# Patient Record
Sex: Female | Born: 1955 | Race: Black or African American | Hispanic: No | Marital: Married | Smoking: Never smoker
Health system: Southern US, Community
[De-identification: ages and names within clinical notes are randomized; demographics above are authoritative.]

## PROBLEM LIST (undated history)

## (undated) DIAGNOSIS — I10 Essential (primary) hypertension: Secondary | ICD-10-CM

## (undated) DIAGNOSIS — I2699 Other pulmonary embolism without acute cor pulmonale: Secondary | ICD-10-CM

## (undated) DIAGNOSIS — E119 Type 2 diabetes mellitus without complications: Secondary | ICD-10-CM

## (undated) HISTORY — PX: ABDOMINAL HYSTERECTOMY: SHX81

---

## 2008-11-15 ENCOUNTER — Encounter: Admission: RE | Admit: 2008-11-15 | Discharge: 2008-11-15 | Payer: Self-pay | Admitting: Occupational Medicine

## 2008-11-15 IMAGING — CR DG CERVICAL SPINE COMPLETE 4+V
5 series · 5 of 5 positions shown · non-contrast
Comparison: None

CLINICAL DATA: Neck pain

CERVICAL SPINE - COMPLETE 4+ VIEW

[view not recorded (1 of 5)]
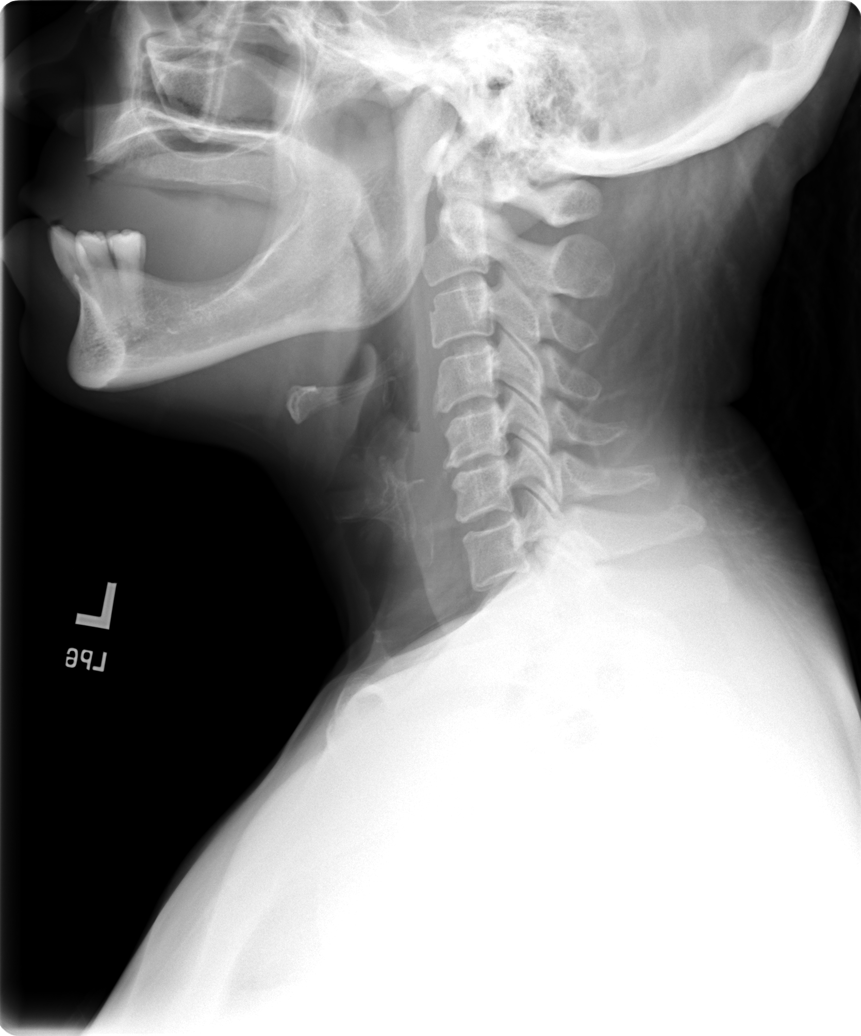

[view not recorded (2 of 5)]
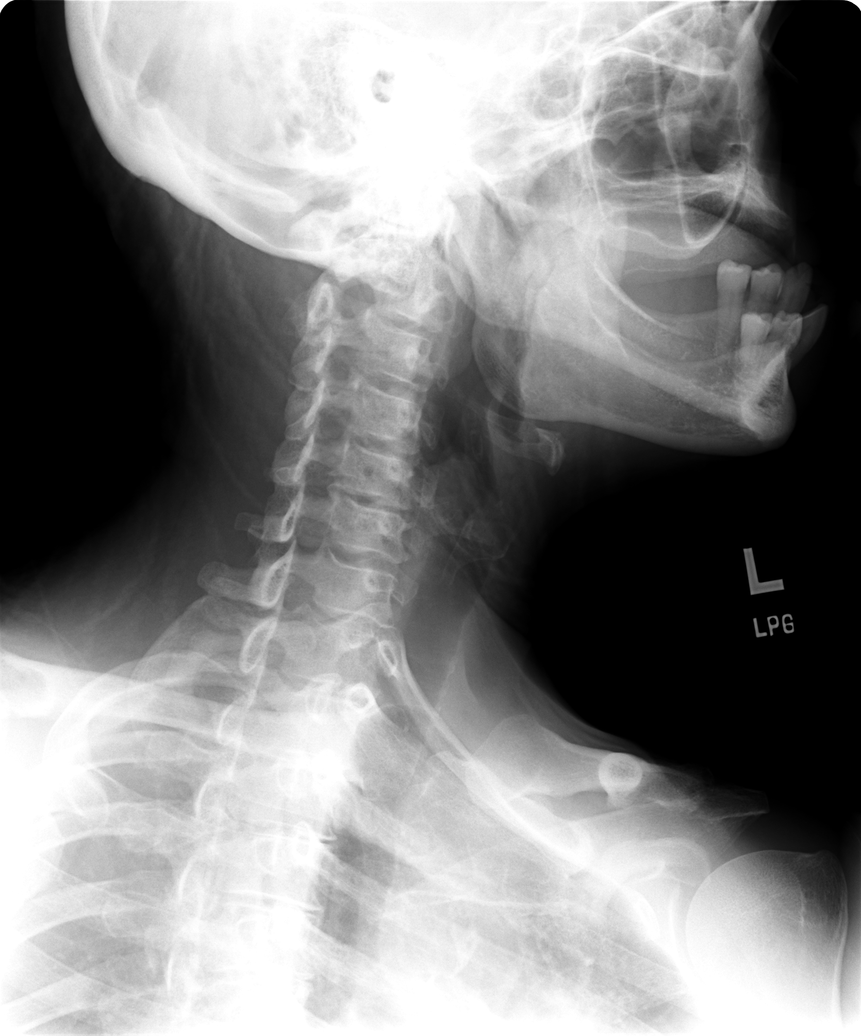

[view not recorded (3 of 5)]
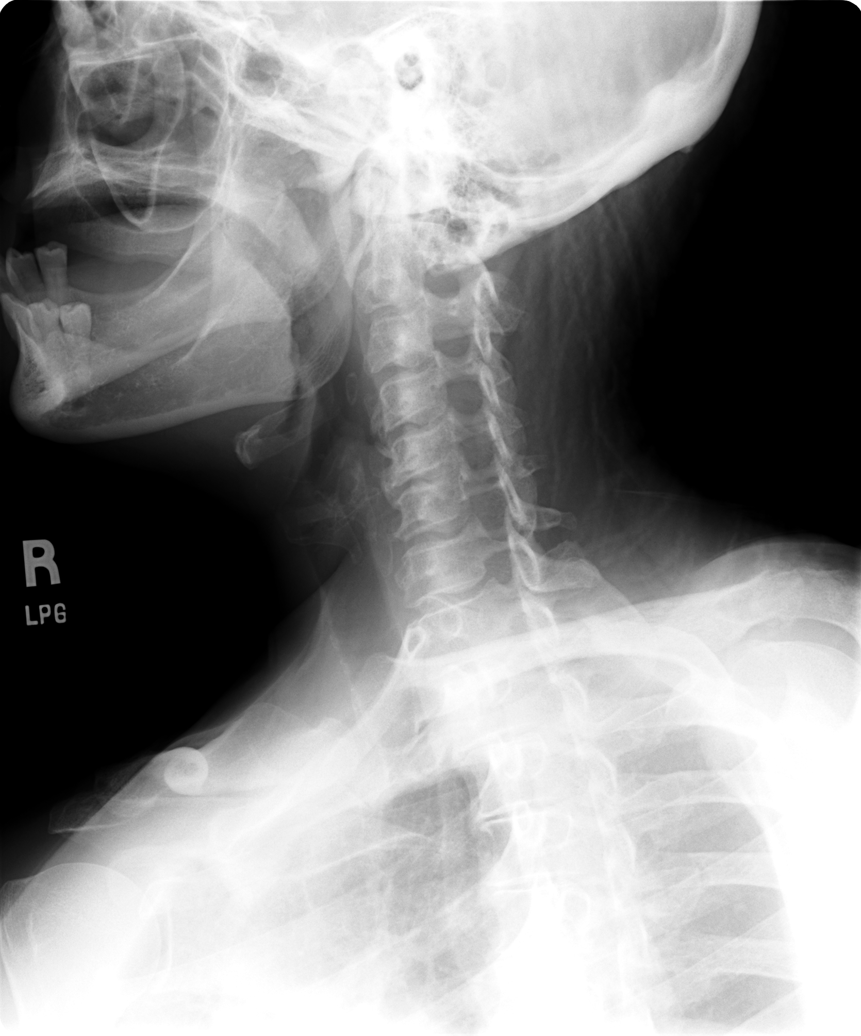

[view not recorded (4 of 5)]
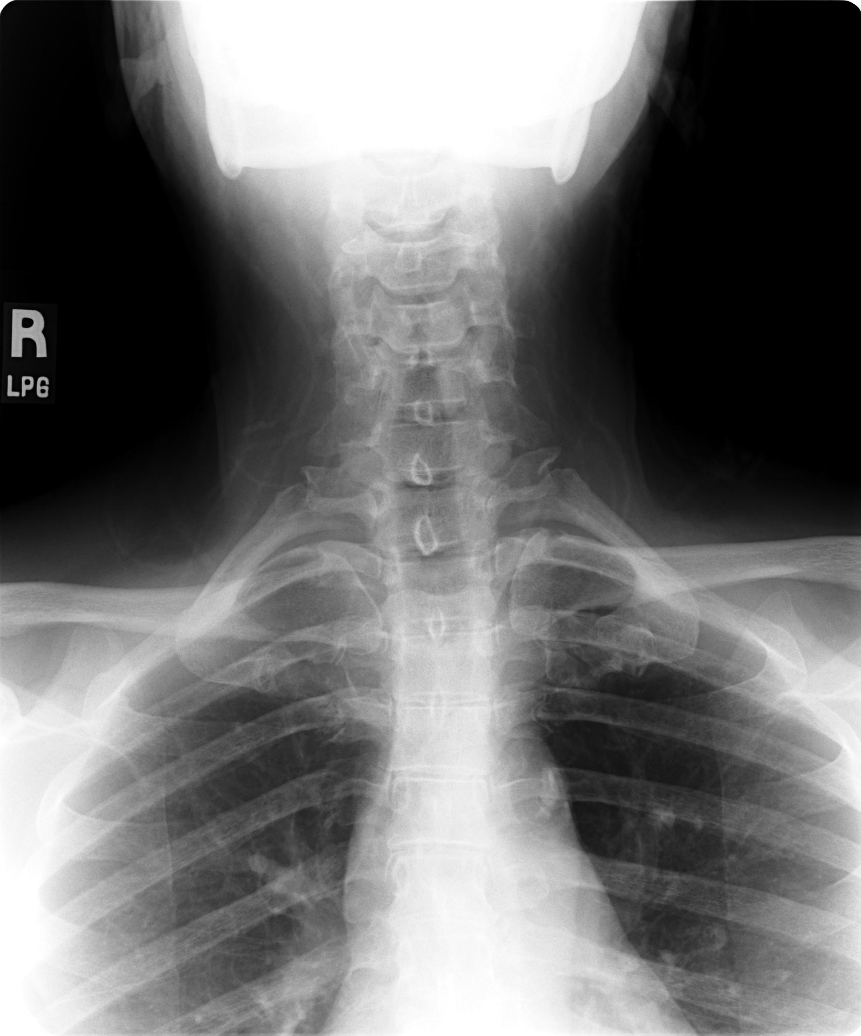

[view not recorded (5 of 5)]
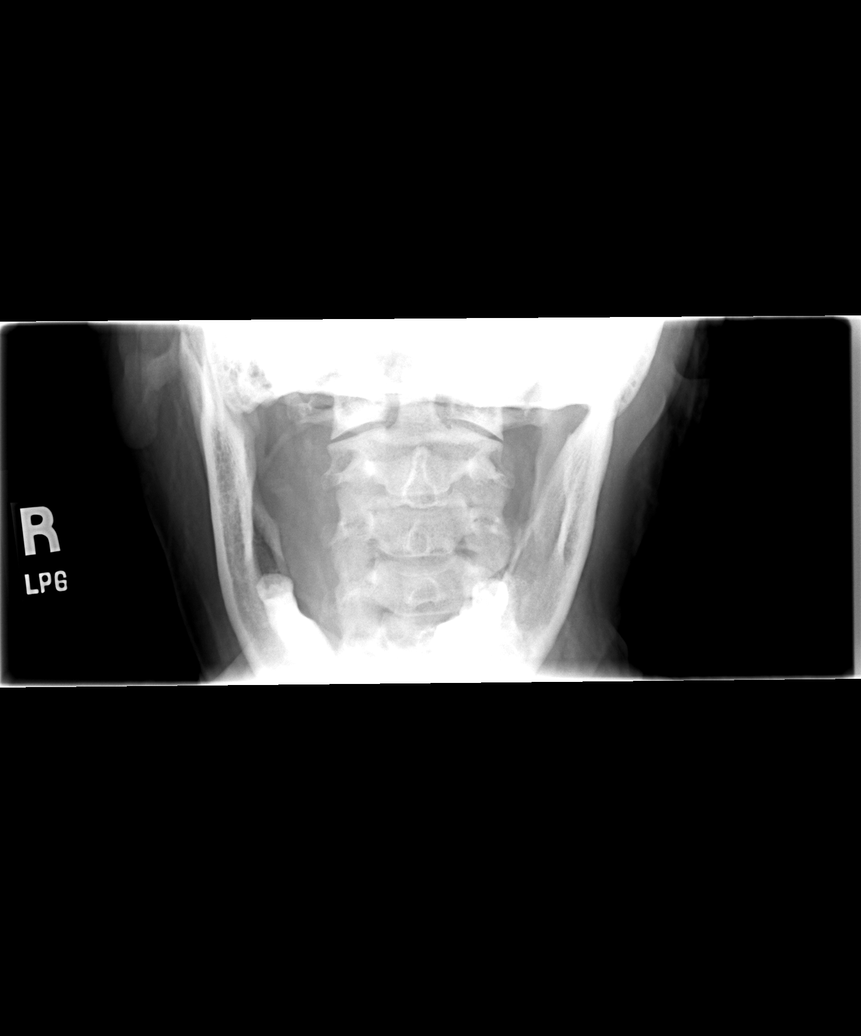

[5 of 5 positions shown; findings below may reference images not displayed]

FINDINGS: The alignment of the cervical spine appears normal.

The vertebral body heights are well preserved.

There is disc space narrowing with anterior spur formation at C4-5
and C5-6.

No fractures or dislocations identified.
IMPRESSION: 1.  There are no acute findings.
2.  Cervical degenerative disc disease.

## 2009-06-07 ENCOUNTER — Encounter: Admission: RE | Admit: 2009-06-07 | Discharge: 2009-06-07 | Payer: Self-pay | Admitting: Internal Medicine

## 2011-06-10 ENCOUNTER — Other Ambulatory Visit: Payer: Self-pay | Admitting: Internal Medicine

## 2011-06-10 DIAGNOSIS — Z1231 Encounter for screening mammogram for malignant neoplasm of breast: Secondary | ICD-10-CM

## 2011-06-27 ENCOUNTER — Ambulatory Visit
Admission: RE | Admit: 2011-06-27 | Discharge: 2011-06-27 | Disposition: A | Discharge status: Home / Self Care | Payer: BC Managed Care – PPO | Source: Ambulatory Visit | Attending: Internal Medicine | Admitting: Internal Medicine

## 2011-06-27 DIAGNOSIS — Z1231 Encounter for screening mammogram for malignant neoplasm of breast: Secondary | ICD-10-CM

## 2012-04-13 DIAGNOSIS — U071 COVID-19: Secondary | ICD-10-CM

## 2012-04-13 HISTORY — DX: COVID-19: U07.1

## 2013-02-15 ENCOUNTER — Other Ambulatory Visit: Payer: Self-pay

## 2013-02-15 DIAGNOSIS — Z1231 Encounter for screening mammogram for malignant neoplasm of breast: Secondary | ICD-10-CM

## 2013-03-08 ENCOUNTER — Ambulatory Visit: Payer: BC Managed Care – PPO

## 2013-03-14 ENCOUNTER — Ambulatory Visit
Admission: RE | Admit: 2013-03-14 | Discharge: 2013-03-14 | Disposition: A | Discharge status: Home / Self Care | Payer: BC Managed Care – PPO | Source: Ambulatory Visit

## 2013-03-14 DIAGNOSIS — Z1231 Encounter for screening mammogram for malignant neoplasm of breast: Secondary | ICD-10-CM

## 2013-03-21 ENCOUNTER — Ambulatory Visit: Payer: BC Managed Care – PPO

## 2013-06-10 ENCOUNTER — Other Ambulatory Visit: Payer: Self-pay | Admitting: Internal Medicine

## 2013-06-10 DIAGNOSIS — E2839 Other primary ovarian failure: Secondary | ICD-10-CM

## 2013-07-12 ENCOUNTER — Ambulatory Visit
Admission: RE | Admit: 2013-07-12 | Discharge: 2013-07-12 | Disposition: A | Discharge status: Home / Self Care | Payer: BC Managed Care – PPO | Source: Ambulatory Visit | Attending: Internal Medicine | Admitting: Internal Medicine

## 2013-07-12 ENCOUNTER — Encounter (INDEPENDENT_AMBULATORY_CARE_PROVIDER_SITE_OTHER): Payer: Self-pay

## 2013-07-12 DIAGNOSIS — E2839 Other primary ovarian failure: Secondary | ICD-10-CM

## 2014-11-06 ENCOUNTER — Encounter (HOSPITAL_BASED_OUTPATIENT_CLINIC_OR_DEPARTMENT_OTHER): Payer: Self-pay | Admitting: *Deleted

## 2014-11-06 DIAGNOSIS — N12 Tubulo-interstitial nephritis, not specified as acute or chronic: Secondary | ICD-10-CM | POA: Diagnosis not present

## 2014-11-06 DIAGNOSIS — E860 Dehydration: Secondary | ICD-10-CM | POA: Diagnosis not present

## 2014-11-06 DIAGNOSIS — Z794 Long term (current) use of insulin: Secondary | ICD-10-CM | POA: Insufficient documentation

## 2014-11-06 DIAGNOSIS — R509 Fever, unspecified: Secondary | ICD-10-CM | POA: Diagnosis present

## 2014-11-06 DIAGNOSIS — I10 Essential (primary) hypertension: Secondary | ICD-10-CM | POA: Diagnosis not present

## 2014-11-06 DIAGNOSIS — Z7982 Long term (current) use of aspirin: Secondary | ICD-10-CM | POA: Insufficient documentation

## 2014-11-06 DIAGNOSIS — E1165 Type 2 diabetes mellitus with hyperglycemia: Secondary | ICD-10-CM | POA: Diagnosis not present

## 2014-11-06 LAB — URINALYSIS, ROUTINE W REFLEX MICROSCOPIC
BILIRUBIN URINE: NEGATIVE
Glucose, UA: >1000 mg/dL — AB
KETONES UR: 15 mg/dL — AB
NITRITE: POSITIVE — AB
PROTEIN: 30 mg/dL — AB
SPECIFIC GRAVITY, URINE: 1.017 (ref 1.005–1.030)
UROBILINOGEN UA: 1 mg/dL (ref 0.0–1.0)
pH: 5.5 (ref 5.0–8.0)

## 2014-11-06 LAB — URINE MICROSCOPIC-ADD ON

## 2014-11-06 MED ORDER — ACETAMINOPHEN 500 MG PO TABS
1000.0000 mg | ORAL_TABLET | Freq: Once | ORAL | Status: AC
  Administered 2014-11-06: 1000 mg via ORAL
  Filled 2014-11-06: qty 2

## 2014-11-06 NOTE — ED Notes (Signed)
Fever x 2 days. Back pain.

## 2014-11-07 ENCOUNTER — Emergency Department (HOSPITAL_BASED_OUTPATIENT_CLINIC_OR_DEPARTMENT_OTHER)
Admission: EM | Admit: 2014-11-07 | Discharge: 2014-11-07 | Disposition: A | Discharge status: Home / Self Care | Payer: BC Managed Care – PPO | Attending: Emergency Medicine | Admitting: Emergency Medicine

## 2014-11-07 DIAGNOSIS — N12 Tubulo-interstitial nephritis, not specified as acute or chronic: Secondary | ICD-10-CM

## 2014-11-07 DIAGNOSIS — R739 Hyperglycemia, unspecified: Secondary | ICD-10-CM

## 2014-11-07 HISTORY — DX: Essential (primary) hypertension: I10

## 2014-11-07 HISTORY — DX: Type 2 diabetes mellitus without complications: E11.9

## 2014-11-07 LAB — BASIC METABOLIC PANEL
Anion gap: 8 (ref 5–15)
BUN: 17 mg/dL (ref 6–20)
CALCIUM: 9.3 mg/dL (ref 8.9–10.3)
CHLORIDE: 99 mmol/L — AB (ref 101–111)
CO2: 26 mmol/L (ref 22–32)
CREATININE: 0.97 mg/dL (ref 0.44–1.00)
GFR calc non Af Amer: >60 mL/min (ref >60)
GLUCOSE: 312 mg/dL — AB (ref 65–99)
Potassium: 3.9 mmol/L (ref 3.5–5.1)
Sodium: 133 mmol/L — ABNORMAL LOW (ref 135–145)

## 2014-11-07 LAB — CBC WITH DIFFERENTIAL/PLATELET
BASOS PCT: 0 %
Basophils Absolute: 0 10*3/uL (ref 0.0–0.1)
Eosinophils Absolute: 0 10*3/uL (ref 0.0–0.7)
Eosinophils Relative: 0 %
HEMATOCRIT: 33.9 % — AB (ref 36.0–46.0)
HEMOGLOBIN: 10.8 g/dL — AB (ref 12.0–15.0)
LYMPHS ABS: 1.3 10*3/uL (ref 0.7–4.0)
LYMPHS PCT: 10 %
MCH: 28.3 pg (ref 26.0–34.0)
MCHC: 31.9 g/dL (ref 30.0–36.0)
MCV: 88.7 fL (ref 78.0–100.0)
MONO ABS: 0.9 10*3/uL (ref 0.1–1.0)
MONOS PCT: 7 %
NEUTROS ABS: 10.8 10*3/uL — AB (ref 1.7–7.7)
NEUTROS PCT: 83 %
Platelets: 177 10*3/uL (ref 150–400)
RBC: 3.82 MIL/uL — ABNORMAL LOW (ref 3.87–5.11)
RDW: 11.3 % — AB (ref 11.5–15.5)
WBC: 13.1 10*3/uL — ABNORMAL HIGH (ref 4.0–10.5)

## 2014-11-07 LAB — I-STAT CG4 LACTIC ACID, ED: Lactic Acid, Venous: 0.63 mmol/L (ref 0.5–2.0)

## 2014-11-07 MED ORDER — CEFTRIAXONE SODIUM 2 G IJ SOLR
INTRAMUSCULAR | Status: AC
  Filled 2014-11-07: qty 2

## 2014-11-07 MED ORDER — CIPROFLOXACIN HCL 500 MG PO TABS: 500.0000 mg | ORAL_TABLET | Freq: Two times a day (BID) | ORAL | Status: DC

## 2014-11-07 MED ORDER — DEXTROSE 5 % IV SOLN
2.0000 g | Freq: Once | INTRAVENOUS | Status: AC
  Administered 2014-11-07: 2 g via INTRAVENOUS

## 2014-11-07 NOTE — Discharge Instructions (Signed)

## 2014-11-07 NOTE — ED Provider Notes (Signed)
CSN: [id number]     Arrival date & time 11/06/14  2241 History   First MD Initiated Contact with Patient 11/07/14 0129     Chief Complaint  Patient presents with  . Fever     (Consider location/radiation/quality/duration/timing/severity/associated sxs/prior Treatment) HPI  This a 59 year old female who presents with generalized bodyaches, chills and back pain. Patient reports onset of symptoms 2 days ago. She reports suprapubic pain as well. She states that she feels she is dehydrated. Her back pain is midline and nonradiating. Currently she reports no pain at all. She states that she feels much better after Tylenol given in triage. She was noted at that time to have a temperature of 102.4. She has not taken her temperature at home. Patient is a diabetic. Reports her blood sugars have been slightly elevated today. At home it was 296. Denies any chest pain, cough, shortness of breath, abdominal pain, nausea or vomiting.  Patient denies hematuria or dysuria.  Past Medical History  Diagnosis Date  . Diabetes mellitus without complication (HCC)   . Hypertension    Past Surgical History  Procedure Laterality Date  . Abdominal hysterectomy     No family history on file. Social History  Substance Use Topics  . Smoking status: Never Smoker   . Smokeless tobacco: None  . Alcohol Use: No   OB History    No data available     Review of Systems  Constitutional: Positive for fever and chills.  Respiratory: Negative for chest tightness and shortness of breath.   Cardiovascular: Negative for chest pain.  Gastrointestinal: Positive for abdominal pain. Negative for nausea and vomiting.  Genitourinary: Negative for dysuria.  Musculoskeletal: Positive for back pain.  Neurological: Negative for headaches.  All other systems reviewed and are negative.     Allergies  Review of patient's allergies indicates no known allergies.  Home Medications   Prior to Admission medications    Medication Sig Start Date End Date Taking? Authorizing Provider  aspirin 81 MG tablet Take 81 mg by mouth daily.   Yes Historical Provider, MD  insulin aspart protamine- aspart (NOVOLOG MIX 70/30) (70-30) 100 UNIT/ML injection Inject into the skin.   Yes Historical Provider, MD  LISINOPRIL PO Take by mouth.   Yes Historical Provider, MD  ciprofloxacin (CIPRO) 500 MG tablet Take 1 tablet (500 mg total) by mouth every 12 (twelve) hours. 11/07/14   Shon Baton, MD   BP 140/63 mmHg  Pulse 104  Temp(Src) 98.7 F (37.1 C) (Oral)  Resp 18  Ht 5' (1.524 m)  Wt 170 lb (77.111 kg)  BMI 33.20 kg/m2  SpO2 98% Physical Exam  Constitutional: She is oriented to person, place, and time. She appears well-developed and well-nourished. No distress.  HENT:  Head: Normocephalic and atraumatic.  Cardiovascular: Normal rate, regular rhythm and normal heart sounds.   No murmur heard. Pulmonary/Chest: Effort normal and breath sounds normal. No respiratory distress. She has no wheezes.  Abdominal: Soft. Bowel sounds are normal. There is no tenderness. There is no rebound and no guarding.  Genitourinary:  No CVA tenderness  Musculoskeletal: She exhibits no edema.  Neurological: She is alert and oriented to person, place, and time.  Skin: Skin is warm and dry.  Psychiatric: She has a normal mood and affect.  Nursing note and vitals reviewed.   ED Course  Procedures (including critical care time) Labs Review Labs Reviewed  URINALYSIS, ROUTINE W REFLEX MICROSCOPIC (NOT AT [facility]) - Abnormal; Notable for the following:  APPearance TURBID (*)    Glucose, UA >1000 (*)    Hgb urine dipstick MODERATE (*)    Ketones, ur 15 (*)    Protein, ur 30 (*)    Nitrite POSITIVE (*)    Leukocytes, UA LARGE (*)    All other components within normal limits  URINE MICROSCOPIC-ADD ON - Abnormal; Notable for the following:    Squamous Epithelial / LPF FEW (*)    Bacteria, UA MANY (*)    All other components  within normal limits  CBC WITH DIFFERENTIAL/PLATELET - Abnormal; Notable for the following:    WBC 13.1 (*)    RBC 3.82 (*)    Hemoglobin 10.8 (*)    HCT 33.9 (*)    RDW 11.3 (*)    Neutro Abs 10.8 (*)    All other components within normal limits  BASIC METABOLIC PANEL - Abnormal; Notable for the following:    Sodium 133 (*)    Chloride 99 (*)    Glucose, Bld 312 (*)    All other components within normal limits  URINE CULTURE  I-STAT CG4 LACTIC ACID, ED  I-STAT CG4 LACTIC ACID, ED    Imaging Review No results found. I have personally reviewed and evaluated these images and lab results as part of my medical decision-making.   EKG Interpretation   Date/Time:  Monday November 06 2014 23:11:34 EDT Ventricular Rate:  118 PR Interval:  148 QRS Duration: 64 QT Interval:  306 QTC Calculation: 428 R Axis:   50 Text Interpretation:  Sinus tachycardia Otherwise normal ECG Confirmed by  Ridhi Hoffert  MD, Toni AmendOURTNEY ([id number]) on 11/07/2014 1:48:54 AM      MDM   Final diagnoses:  Pyelonephritis  Hyperglycemia    This a very pleasant 59 year old female who presents with chills, back pain, suprapubic pain and fever. Nontoxic on exam. She is currently asymptomatic and her exam is benign. She was noted to be febrile to 102.4 with tachycardia on initial evaluation. Tachycardia has improved as fever has defervesced.  Patient was nitrite-positive urine into numerous to count white cells. She has no CVA tenderness at the time but given back pain/fever, suspect pyelonephritis. Patient with mild leukocytosis to 13.1. Lactate is normal. Given that the patient is feeling better, feel she is a candidate for outpatient treatment. No evidence of DKA. She does have evidence of hyperglycemia. Patient was given 2 g of Rocephin and will be discharged with Cipro. Urine culture pending. Patient was given strict return precautions.  After history, exam, and medical workup I feel the patient has been appropriately  medically screened and is safe for discharge home. Pertinent diagnoses were discussed with the patient. Patient was given return precautions.     Shon Batonourtney F Kadejah Sandiford, MD 11/07/14 [phone]

## 2014-11-09 LAB — URINE CULTURE: Culture: >=100000

## 2014-11-10 ENCOUNTER — Telehealth (HOSPITAL_BASED_OUTPATIENT_CLINIC_OR_DEPARTMENT_OTHER): Payer: Self-pay | Admitting: Emergency Medicine

## 2014-11-10 NOTE — Telephone Encounter (Signed)
Post ED Visit - Positive Culture Follow-up  Culture report reviewed by antimicrobial stewardship pharmacist:  []  Celedonio MiyamotoJeremy Frens, Pharm.D., BCPS []  Georgina PillionElizabeth Martin, Pharm.D., BCPS []  AmbroseMinh Pham, [address]Pharm[address]D., BCPS, AAHIVP []  Estella HuskMichelle Turner, Pharm.D., BCPS, AAHIVP []  Ashtonristy Reyes, [address]Pharm[address]D. []  Tennis Mustassie Stewart, [place]arm[place]D. Lisette GrinderAlyson Leonard PharmD  Positive urine culture E. coli Treated with ciprofloxacin, organism sensitive to the same and no further patient follow-up is required at this time.  Berle MullMiller, Beauregard Jarrells 11/10/2014, 11:12 AM

## 2014-11-19 ENCOUNTER — Emergency Department (HOSPITAL_BASED_OUTPATIENT_CLINIC_OR_DEPARTMENT_OTHER)
Admission: EM | Admit: 2014-11-19 | Discharge: 2014-11-19 | Disposition: A | Discharge status: Home / Self Care | Payer: BC Managed Care – PPO | Attending: Emergency Medicine | Admitting: Emergency Medicine

## 2014-11-19 ENCOUNTER — Encounter (HOSPITAL_BASED_OUTPATIENT_CLINIC_OR_DEPARTMENT_OTHER): Payer: Self-pay | Admitting: Emergency Medicine

## 2014-11-19 DIAGNOSIS — L293 Anogenital pruritus, unspecified: Secondary | ICD-10-CM | POA: Diagnosis present

## 2014-11-19 DIAGNOSIS — N390 Urinary tract infection, site not specified: Secondary | ICD-10-CM | POA: Insufficient documentation

## 2014-11-19 DIAGNOSIS — E119 Type 2 diabetes mellitus without complications: Secondary | ICD-10-CM | POA: Insufficient documentation

## 2014-11-19 DIAGNOSIS — Z7982 Long term (current) use of aspirin: Secondary | ICD-10-CM | POA: Insufficient documentation

## 2014-11-19 DIAGNOSIS — B373 Candidiasis of vulva and vagina: Secondary | ICD-10-CM | POA: Diagnosis not present

## 2014-11-19 DIAGNOSIS — I10 Essential (primary) hypertension: Secondary | ICD-10-CM | POA: Diagnosis not present

## 2014-11-19 DIAGNOSIS — B3731 Acute candidiasis of vulva and vagina: Secondary | ICD-10-CM

## 2014-11-19 LAB — WET PREP, GENITAL: TRICH WET PREP: NONE SEEN

## 2014-11-19 LAB — URINALYSIS, ROUTINE W REFLEX MICROSCOPIC
BILIRUBIN URINE: NEGATIVE
HGB URINE DIPSTICK: NEGATIVE
KETONES UR: NEGATIVE mg/dL
Nitrite: NEGATIVE
PH: 7 (ref 5.0–8.0)
PROTEIN: NEGATIVE mg/dL
Specific Gravity, Urine: 1.03 (ref 1.005–1.030)
Urobilinogen, UA: 0.2 mg/dL (ref 0.0–1.0)

## 2014-11-19 LAB — URINE MICROSCOPIC-ADD ON

## 2014-11-19 MED ORDER — FLUCONAZOLE 150 MG PO TABS: 150.0000 mg | ORAL_TABLET | Freq: Once | ORAL | Status: DC

## 2014-11-19 MED ORDER — NITROFURANTOIN MONOHYD MACRO 100 MG PO CAPS: 100.0000 mg | ORAL_CAPSULE | Freq: Two times a day (BID) | ORAL | Status: DC

## 2014-11-19 NOTE — ED Provider Notes (Signed)
CSN: [id number]     Arrival date & time 11/19/14  1617 History  By signing my name below, I, Terrance Branch, attest that this documentation has been prepared under the direction and in the presence of Arby Barrette, MD. Electronically Signed: Evon Slack, ED Scribe. 11/19/2014. 7:30 PM.     Chief Complaint  Patient presents with  . Vaginal Itching    Patient is a 59 y.o. female presenting with vaginal itching. The history is provided by the patient. No language interpreter was used.  Vaginal Itching   HPI Comments: Diane Willis is a 59 y.o. female who presents to the Emergency Department complaining of Vaginal itching onset 1 week prior. Pt states she does some slight vaginal discharge. Pt states that she was recently diagnosed with an UTI about 1 week prior. She states she was prescribed cipro which she was complaint with taking. Pt reports Hx of yeast infection several years prior and states that her symptoms are similar to previous yeast infection. Pt denies vaginal pain, dysuria, or hematuria     Past Medical History  Diagnosis Date  . Diabetes mellitus without complication (HCC)   . Hypertension    Past Surgical History  Procedure Laterality Date  . Abdominal hysterectomy     History reviewed. No pertinent family history. Social History  Substance Use Topics  . Smoking status: Never Smoker   . Smokeless tobacco: None  . Alcohol Use: No   OB History    No data available     Review of Systems 10 Systems reviewed and all are negative for acute change except as noted in the HPI.    Allergies  Review of patient's allergies indicates no known allergies.  Home Medications   Prior to Admission medications   Medication Sig Start Date End Date Taking? Authorizing Provider  aspirin 81 MG tablet Take 81 mg by mouth daily.    Historical Provider, MD  ciprofloxacin (CIPRO) 500 MG tablet Take 1 tablet (500 mg total) by mouth every 12 (twelve) hours. 11/07/14    Shon Baton, MD  fluconazole (DIFLUCAN) 150 MG tablet Take 1 tablet (150 mg total) by mouth once. 11/19/14   Arby Barrette, MD  insulin aspart protamine- aspart (NOVOLOG MIX 70/30) (70-30) 100 UNIT/ML injection Inject into the skin.    Historical Provider, MD  LISINOPRIL PO Take by mouth.    Historical Provider, MD  nitrofurantoin, macrocrystal-monohydrate, (MACROBID) 100 MG capsule Take 1 capsule (100 mg total) by mouth 2 (two) times daily. 11/19/14   Arby Barrette, MD   BP 167/81 mmHg  Pulse 99  Temp(Src) 97.5 F (36.4 C) (Oral)  Resp 20  Ht 5' (1.524 m)  Wt 165 lb (74.844 kg)  BMI 32.22 kg/m2  SpO2 100%   Physical Exam  Constitutional: She is oriented to person, place, and time. She appears well-developed and well-nourished.  HENT:  Head: Normocephalic and atraumatic.  Eyes: EOM are normal. Pupils are equal, round, and reactive to light.  Neck: Neck supple.  Cardiovascular: Normal rate, regular rhythm, normal heart sounds and intact distal pulses.   Pulmonary/Chest: Effort normal and breath sounds normal.  Abdominal: Soft. Bowel sounds are normal. She exhibits no distension. There is no tenderness.  Genitourinary:  Mild erythema and swelling of the external vaginal tissues. Small amount of white caseous material present. Speculum examination, blind pouch vaginal canal with small amount of whitish curd-like discharge.  Musculoskeletal: Normal range of motion. She exhibits no edema.  Neurological: She is alert and  oriented to person, place, and time. She has normal strength. Coordination normal. GCS eye subscore is 4. GCS verbal subscore is 5. GCS motor subscore is 6.  Skin: Skin is warm, dry and intact.  Psychiatric: She has a normal mood and affect.    ED Course  Procedures (including critical care time) DIAGNOSTIC STUDIES: Oxygen Saturation is 100% on RA, normal by my interpretation.    COORDINATION OF CARE: 5:12 PM-Discussed treatment plan with pt at bedside and pt  agreed to plan.     Labs Review Labs Reviewed  WET PREP, GENITAL - Abnormal; Notable for the following:    Yeast Wet Prep HPF POC FEW (*)    Clue Cells Wet Prep HPF POC MODERATE (*)    WBC, Wet Prep HPF POC MANY (*)    All other components within normal limits  URINALYSIS, ROUTINE W REFLEX MICROSCOPIC (NOT AT [facility]) - Abnormal; Notable for the following:    Glucose, UA >1000 (*)    Leukocytes, UA SMALL (*)    All other components within normal limits  URINE MICROSCOPIC-ADD ON - Abnormal; Notable for the following:    Squamous Epithelial / LPF FEW (*)    Bacteria, UA MANY (*)    All other components within normal limits  URINE CULTURE  GC/CHLAMYDIA PROBE AMP ([place]) NOT AT [facility]    Imaging Review No results found.    EKG Interpretation None      MDM   Final diagnoses:  Candida vaginitis  UTI (lower urinary tract infection)   Patient previously had Escherichia coli positive culture for UTI. She has finished her Cipro and does not have ongoing dysuria or or fever or pain. She does however have vaginal discharge and itching. Exam is consistent with yeast vaginitis for which patient be treated. Repeat UA does still have white blood cells present. With the patient having had greater than 100,000 colony positive culture, I will opt to treat with Macrobid to which this was sensitive on first culture as the patient still has a positive urine. She is counseled to follow-up with her family doctor this week.     Arby BarretteMarcy Sunya Humbarger, MD 11/19/14 [phone]

## 2014-11-19 NOTE — ED Notes (Signed)
Patient states that she was here about 1 week ago with a UTI - since then she has had some Vaginal itching possible r/t to the antibiotics

## 2014-11-19 NOTE — Discharge Instructions (Signed)
Monilial Vaginitis Vaginitis in a soreness, swelling and redness (inflammation) of the vagina and vulva. Monilial vaginitis is not a sexually transmitted infection. CAUSES  Yeast vaginitis is caused by yeast (candida) that is normally found in your vagina. With a yeast infection, the candida has overgrown in number to a point that upsets the chemical balance. SYMPTOMS   White, thick vaginal discharge.  Swelling, itching, redness and irritation of the vagina and possibly the lips of the vagina (vulva).  Burning or painful urination.  Painful intercourse. DIAGNOSIS  Things that may contribute to monilial vaginitis are:  Postmenopausal and virginal states.  Pregnancy.  Infections.  Being tired, sick or stressed, especially if you had monilial vaginitis in the past.  Diabetes. Good control will help lower the chance.  Birth control pills.  Tight fitting garments.  Using bubble bath, feminine sprays, douches or deodorant tampons.  Taking certain medications that kill germs (antibiotics).  Sporadic recurrence can occur if you become ill. TREATMENT  Your caregiver will give you medication.  There are several kinds of anti monilial vaginal creams and suppositories specific for monilial vaginitis. For recurrent yeast infections, use a suppository or cream in the vagina 2 times a week, or as directed.  Anti-monilial or steroid cream for the itching or irritation of the vulva may also be used. Get your caregiver's permission.  Painting the vagina with methylene blue solution may help if the monilial cream does not work.  Eating yogurt may help prevent monilial vaginitis. HOME CARE INSTRUCTIONS   Finish all medication as prescribed.  Do not have sex until treatment is completed or after your caregiver tells you it is okay.  Take warm sitz baths.  Do not douche.  Do not use tampons, especially scented ones.  Wear cotton underwear.  Avoid tight pants and panty  hose.  Tell your sexual partner that you have a yeast infection. They should go to their caregiver if they have symptoms such as mild rash or itching.  Your sexual partner should be treated as well if your infection is difficult to eliminate.  Practice safer sex. Use condoms.  Some vaginal medications cause latex condoms to fail. Vaginal medications that harm condoms are:  Cleocin cream.  Butoconazole (Femstat).  Terconazole (Terazol) vaginal suppository.  Miconazole (Monistat) (may be purchased over the counter). SEEK MEDICAL CARE IF:   You have a temperature by mouth above 102 F (38.9 C).  The infection is getting worse after 2 days of treatment.  The infection is not getting better after 3 days of treatment.  You develop blisters in or around your vagina.  You develop vaginal bleeding, and it is not your menstrual period.  You have pain when you urinate.  You develop intestinal problems.  You have pain with sexual intercourse.   This information is not intended to replace advice given to you by your health care provider. Make sure you discuss any questions you have with your health care provider.   Document Released: 10/09/2004 Document Revised: 03/24/2011 Document Reviewed: 07/03/2014 Elsevier Interactive Patient Education 2016 Elsevier Inc. Urinary Tract Infection Urinary tract infections (UTIs) can develop anywhere along your urinary tract. Your urinary tract is your body's drainage system for removing wastes and extra water. Your urinary tract includes two kidneys, two ureters, a bladder, and a urethra. Your kidneys are a pair of bean-shaped organs. Each kidney is about the size of your fist. They are located below your ribs, one on each side of your spine. CAUSES Infections are   caused by microbes, which are microscopic organisms, including fungi, viruses, and bacteria. These organisms are so small that they can only be seen through a microscope. Bacteria are  the microbes that most commonly cause UTIs. SYMPTOMS  Symptoms of UTIs may vary by age and gender of the patient and by the location of the infection. Symptoms in young women typically include a frequent and intense urge to urinate and a painful, burning feeling in the bladder or urethra during urination. Older women and men are more likely to be tired, shaky, and weak and have muscle aches and abdominal pain. A fever may mean the infection is in your kidneys. Other symptoms of a kidney infection include pain in your back or sides below the ribs, nausea, and vomiting. DIAGNOSIS To diagnose a UTI, your caregiver will ask you about your symptoms. Your caregiver will also ask you to provide a urine sample. The urine sample will be tested for bacteria and white blood cells. White blood cells are made by your body to help fight infection. TREATMENT  Typically, UTIs can be treated with medication. Because most UTIs are caused by a bacterial infection, they usually can be treated with the use of antibiotics. The choice of antibiotic and length of treatment depend on your symptoms and the type of bacteria causing your infection. HOME CARE INSTRUCTIONS  If you were prescribed antibiotics, take them exactly as your caregiver instructs you. Finish the medication even if you feel better after you have only taken some of the medication.  Drink enough water and fluids to keep your urine clear or pale yellow.  Avoid caffeine, tea, and carbonated beverages. They tend to irritate your bladder.  Empty your bladder often. Avoid holding urine for long periods of time.  Empty your bladder before and after sexual intercourse.  After a bowel movement, women should cleanse from front to back. Use each tissue only once. SEEK MEDICAL CARE IF:   You have back pain.  You develop a fever.  Your symptoms do not begin to resolve within 3 days. SEEK IMMEDIATE MEDICAL CARE IF:   You have severe back pain or lower  abdominal pain.  You develop chills.  You have nausea or vomiting.  You have continued burning or discomfort with urination. MAKE SURE YOU:   Understand these instructions.  Will watch your condition.  Will get help right away if you are not doing well or get worse.   This information is not intended to replace advice given to you by your health care provider. Make sure you discuss any questions you have with your health care provider.   Document Released: 10/09/2004 Document Revised: 09/20/2014 Document Reviewed: 02/07/2011 Elsevier Interactive Patient Education 2016 Elsevier Inc.  

## 2014-11-21 LAB — URINE CULTURE

## 2014-11-22 ENCOUNTER — Telehealth (HOSPITAL_BASED_OUTPATIENT_CLINIC_OR_DEPARTMENT_OTHER): Payer: Self-pay | Admitting: Emergency Medicine

## 2014-11-22 NOTE — Telephone Encounter (Signed)
Post ED Visit - Positive Culture Follow-up  Culture report reviewed by antimicrobial stewardship pharmacist:  []  Diane Willis, Pharm.D. []  Diane Willis, Pharm.D., BCPS []  Diane Willis, Pharm.D. []  Diane Willis, Pharm.D., BCPS []  Diane Willis, [address]Pharm[address]D., BCPS, AAHIVP []  Diane Willis, Pharm.D., BCPS, AAHIVP []  Diane Willis, Pharm.D. [x]  Diane Willis, [address]Pharm[address]  Positive urine culture Lactobacillus Treated with fluconazole, nitrofurantoin, likely contaminant, organism sensitive to the same and no further patient follow-up is required at this time.  Diane Willis, Diane Willis 11/22/2014, 2:10 PM

## 2014-11-22 NOTE — Progress Notes (Signed)
ED Antimicrobial Stewardship Positive Culture Follow Up   Diane Willis is an 59 y.o. female who [facility] with a chief complaint of  Chief Complaint  Patient presents with  . Vaginal Itching    Recent Results (from the past 720 hour(s))  Urine culture     Status: None   Collection Time: 11/06/14 11:05 PM  Result Value Ref Range Status   Specimen Description URINE, CLEAN CATCH  Final   Special Requests NONE  Final   Culture   Final    >=100,000 COLONIES/mL ESCHERICHIA COLI Performed at [facility] [place]    Report Status 11/09/2014 FINAL  Final   Organism ID, Bacteria ESCHERICHIA COLI  Final      Susceptibility   Escherichia coli - MIC*    AMPICILLIN >=32 RESISTANT Resistant     CEFAZOLIN <=4 SENSITIVE Sensitive     CEFTRIAXONE <=1 SENSITIVE Sensitive     CIPROFLOXACIN <=0.25 SENSITIVE Sensitive     GENTAMICIN <=1 SENSITIVE Sensitive     IMIPENEM <=0.25 SENSITIVE Sensitive     NITROFURANTOIN <=16 SENSITIVE Sensitive     TRIMETH/SULFA <=20 SENSITIVE Sensitive     AMPICILLIN/SULBACTAM 16 INTERMEDIATE Intermediate     PIP/TAZO <=4 SENSITIVE Sensitive     * >=100,000 COLONIES/mL ESCHERICHIA COLI  Wet prep, genital     Status: Abnormal   Collection Time: 11/19/14  6:27 PM  Result Value Ref Range Status   Yeast Wet Prep HPF POC FEW (A) NONE SEEN Final   Trich, Wet Prep NONE SEEN NONE SEEN Final   Clue Cells Wet Prep HPF POC MODERATE (A) NONE SEEN Final   WBC, Wet Prep HPF POC MANY (A) NONE SEEN Final  Urine culture     Status: None   Collection Time: 11/19/14  6:36 PM  Result Value Ref Range Status   Specimen Description URINE, CLEAN CATCH  Final   Special Requests NONE  Final   Culture   Final    >=100,000 COLONIES/mL LACTOBACILLUS SPECIES Standardized susceptibility testing for this organism is not available. Performed [facility]     Report Status 11/21/2014 FINAL  Final    Presents with vaginal itching and mild discharge x1  wk. Patient states her symptoms are similar to yeast infection she had previously. Of note, pt was treated with ciprofloxacin for E. Coli (cipro-S) UTI two weeks prior. Vaginal exam reveals small amount of whitish, curd-like discharge. No complaints of dysuria or abdominal pain per patient. Urine culture grew lactobacillus which is likely a contaminant. Discharged on fluconazole and nitrofurantoin. Discussed with Cheri FowlerKayla Rose, PA-C and agrees that patient does not need further ABX treatment.  ED Provider: Cheri FowlerKayla Rose, PA-C   Reuel Derbyobert J Vincent, PharmD. 11/22/2014, 9:20 AM PGY1 Resident Phone# [phone]

## 2014-12-19 ENCOUNTER — Other Ambulatory Visit: Payer: Self-pay

## 2014-12-19 DIAGNOSIS — Z1231 Encounter for screening mammogram for malignant neoplasm of breast: Secondary | ICD-10-CM

## 2015-01-05 ENCOUNTER — Ambulatory Visit
Admission: RE | Admit: 2015-01-05 | Discharge: 2015-01-05 | Disposition: A | Discharge status: Home / Self Care | Payer: BC Managed Care – PPO | Source: Ambulatory Visit

## 2015-01-05 DIAGNOSIS — Z1231 Encounter for screening mammogram for malignant neoplasm of breast: Secondary | ICD-10-CM

## 2015-11-15 ENCOUNTER — Other Ambulatory Visit: Payer: Self-pay | Admitting: Internal Medicine

## 2015-11-15 DIAGNOSIS — E2839 Other primary ovarian failure: Secondary | ICD-10-CM

## 2019-04-27 ENCOUNTER — Encounter (HOSPITAL_BASED_OUTPATIENT_CLINIC_OR_DEPARTMENT_OTHER): Payer: Self-pay | Admitting: *Deleted

## 2019-04-27 ENCOUNTER — Emergency Department (HOSPITAL_BASED_OUTPATIENT_CLINIC_OR_DEPARTMENT_OTHER): Payer: BC Managed Care – PPO

## 2019-04-27 ENCOUNTER — Inpatient Hospital Stay (HOSPITAL_BASED_OUTPATIENT_CLINIC_OR_DEPARTMENT_OTHER)
Admission: EM | Admit: 2019-04-27 | Discharge: 2019-04-30 | DRG: 177 | Disposition: A | Discharge status: Home / Self Care | Payer: BC Managed Care – PPO | Attending: Internal Medicine | Admitting: Internal Medicine

## 2019-04-27 ENCOUNTER — Other Ambulatory Visit: Payer: Self-pay

## 2019-04-27 DIAGNOSIS — J1282 Pneumonia due to coronavirus disease 2019: Secondary | ICD-10-CM | POA: Diagnosis present

## 2019-04-27 DIAGNOSIS — I2699 Other pulmonary embolism without acute cor pulmonale: Secondary | ICD-10-CM | POA: Diagnosis not present

## 2019-04-27 DIAGNOSIS — Z90710 Acquired absence of both cervix and uterus: Secondary | ICD-10-CM | POA: Diagnosis not present

## 2019-04-27 DIAGNOSIS — E11649 Type 2 diabetes mellitus with hypoglycemia without coma: Secondary | ICD-10-CM | POA: Diagnosis not present

## 2019-04-27 DIAGNOSIS — U071 COVID-19: Principal | ICD-10-CM | POA: Diagnosis present

## 2019-04-27 DIAGNOSIS — E669 Obesity, unspecified: Secondary | ICD-10-CM | POA: Diagnosis present

## 2019-04-27 DIAGNOSIS — Z6833 Body mass index (BMI) 33.0-33.9, adult: Secondary | ICD-10-CM | POA: Diagnosis not present

## 2019-04-27 DIAGNOSIS — R609 Edema, unspecified: Secondary | ICD-10-CM | POA: Diagnosis not present

## 2019-04-27 DIAGNOSIS — I2694 Multiple subsegmental pulmonary emboli without acute cor pulmonale: Secondary | ICD-10-CM | POA: Diagnosis present

## 2019-04-27 DIAGNOSIS — I10 Essential (primary) hypertension: Secondary | ICD-10-CM | POA: Diagnosis present

## 2019-04-27 DIAGNOSIS — E785 Hyperlipidemia, unspecified: Secondary | ICD-10-CM | POA: Diagnosis present

## 2019-04-27 DIAGNOSIS — Z79899 Other long term (current) drug therapy: Secondary | ICD-10-CM | POA: Diagnosis not present

## 2019-04-27 DIAGNOSIS — Z7982 Long term (current) use of aspirin: Secondary | ICD-10-CM

## 2019-04-27 DIAGNOSIS — J9601 Acute respiratory failure with hypoxia: Secondary | ICD-10-CM | POA: Diagnosis present

## 2019-04-27 DIAGNOSIS — D649 Anemia, unspecified: Secondary | ICD-10-CM | POA: Diagnosis present

## 2019-04-27 DIAGNOSIS — Z794 Long term (current) use of insulin: Secondary | ICD-10-CM

## 2019-04-27 DIAGNOSIS — N179 Acute kidney failure, unspecified: Secondary | ICD-10-CM | POA: Diagnosis present

## 2019-04-27 DIAGNOSIS — I340 Nonrheumatic mitral (valve) insufficiency: Secondary | ICD-10-CM | POA: Diagnosis not present

## 2019-04-27 DIAGNOSIS — R0602 Shortness of breath: Secondary | ICD-10-CM

## 2019-04-27 DIAGNOSIS — I371 Nonrheumatic pulmonary valve insufficiency: Secondary | ICD-10-CM | POA: Diagnosis not present

## 2019-04-27 LAB — FERRITIN: Ferritin: 111 ng/mL (ref 11–307)

## 2019-04-27 LAB — COMPREHENSIVE METABOLIC PANEL
ALT: 27 U/L (ref 0–44)
AST: 38 U/L (ref 15–41)
Albumin: 3.3 g/dL — ABNORMAL LOW (ref 3.5–5.0)
Alkaline Phosphatase: 59 U/L (ref 38–126)
Anion gap: 14 (ref 5–15)
BUN: 25 mg/dL — ABNORMAL HIGH (ref 8–23)
CO2: 25 mmol/L (ref 22–32)
Calcium: 8.7 mg/dL — ABNORMAL LOW (ref 8.9–10.3)
Chloride: 91 mmol/L — ABNORMAL LOW (ref 98–111)
Creatinine, Ser: 1.12 mg/dL — ABNORMAL HIGH (ref 0.44–1.00)
GFR calc Af Amer: >60 mL/min (ref >60)
GFR calc non Af Amer: 53 mL/min — ABNORMAL LOW (ref >60)
Glucose, Bld: 152 mg/dL — ABNORMAL HIGH (ref 70–99)
Potassium: 4.3 mmol/L (ref 3.5–5.1)
Sodium: 130 mmol/L — ABNORMAL LOW (ref 135–145)
Total Bilirubin: 0.5 mg/dL (ref 0.3–1.2)
Total Protein: 7.3 g/dL (ref 6.5–8.1)

## 2019-04-27 LAB — FIBRINOGEN: Fibrinogen: 598 mg/dL — ABNORMAL HIGH (ref 210–475)

## 2019-04-27 LAB — LACTIC ACID, PLASMA
Lactic Acid, Venous: 2.3 mmol/L (ref 0.5–1.9)
Lactic Acid, Venous: 1.4 mmol/L (ref 0.5–1.9)

## 2019-04-27 LAB — CBC WITH DIFFERENTIAL/PLATELET
Abs Immature Granulocytes: 0.1 10*3/uL — ABNORMAL HIGH (ref 0.00–0.07)
Basophils Absolute: 0 10*3/uL (ref 0.0–0.1)
Basophils Relative: 0 %
Eosinophils Absolute: 0 10*3/uL (ref 0.0–0.5)
Eosinophils Relative: 0 %
HCT: 33.6 % — ABNORMAL LOW (ref 36.0–46.0)
Hemoglobin: 10.8 g/dL — ABNORMAL LOW (ref 12.0–15.0)
Immature Granulocytes: 2 %
Lymphocytes Relative: 13 %
Lymphs Abs: 0.8 10*3/uL (ref 0.7–4.0)
MCH: 31.1 pg (ref 26.0–34.0)
MCHC: 32.1 g/dL (ref 30.0–36.0)
MCV: 96.8 fL (ref 80.0–100.0)
Monocytes Absolute: 0.2 10*3/uL (ref 0.1–1.0)
Monocytes Relative: 3 %
Neutro Abs: 5.1 10*3/uL (ref 1.7–7.7)
Neutrophils Relative %: 82 %
Platelets: 246 10*3/uL (ref 150–400)
RBC: 3.47 MIL/uL — ABNORMAL LOW (ref 3.87–5.11)
RDW: 12.6 % (ref 11.5–15.5)
WBC: 6.2 10*3/uL (ref 4.0–10.5)
nRBC: 0.5 % — ABNORMAL HIGH (ref 0.0–0.2)

## 2019-04-27 LAB — TRIGLYCERIDES: Triglycerides: 75 mg/dL (ref <150)

## 2019-04-27 LAB — PROCALCITONIN: Procalcitonin: 0.5 ng/mL

## 2019-04-27 LAB — D-DIMER, QUANTITATIVE: D-Dimer, Quant: >20 ug/mL-FEU — ABNORMAL HIGH (ref 0.00–0.50)

## 2019-04-27 LAB — CBG MONITORING, ED: Glucose-Capillary: 158 mg/dL — ABNORMAL HIGH (ref 70–99)

## 2019-04-27 LAB — LACTATE DEHYDROGENASE: LDH: 456 U/L — ABNORMAL HIGH (ref 98–192)

## 2019-04-27 LAB — C-REACTIVE PROTEIN: CRP: 19.2 mg/dL — ABNORMAL HIGH (ref <1.0)

## 2019-04-27 IMAGING — DX DG CHEST 1V PORT
1 series · 1 of 1 positions shown · non-contrast
Comparison: None.

CLINICAL DATA: COVID, shortness of breath

EXAM:
PORTABLE CHEST 1 VIEW

[chest ap]
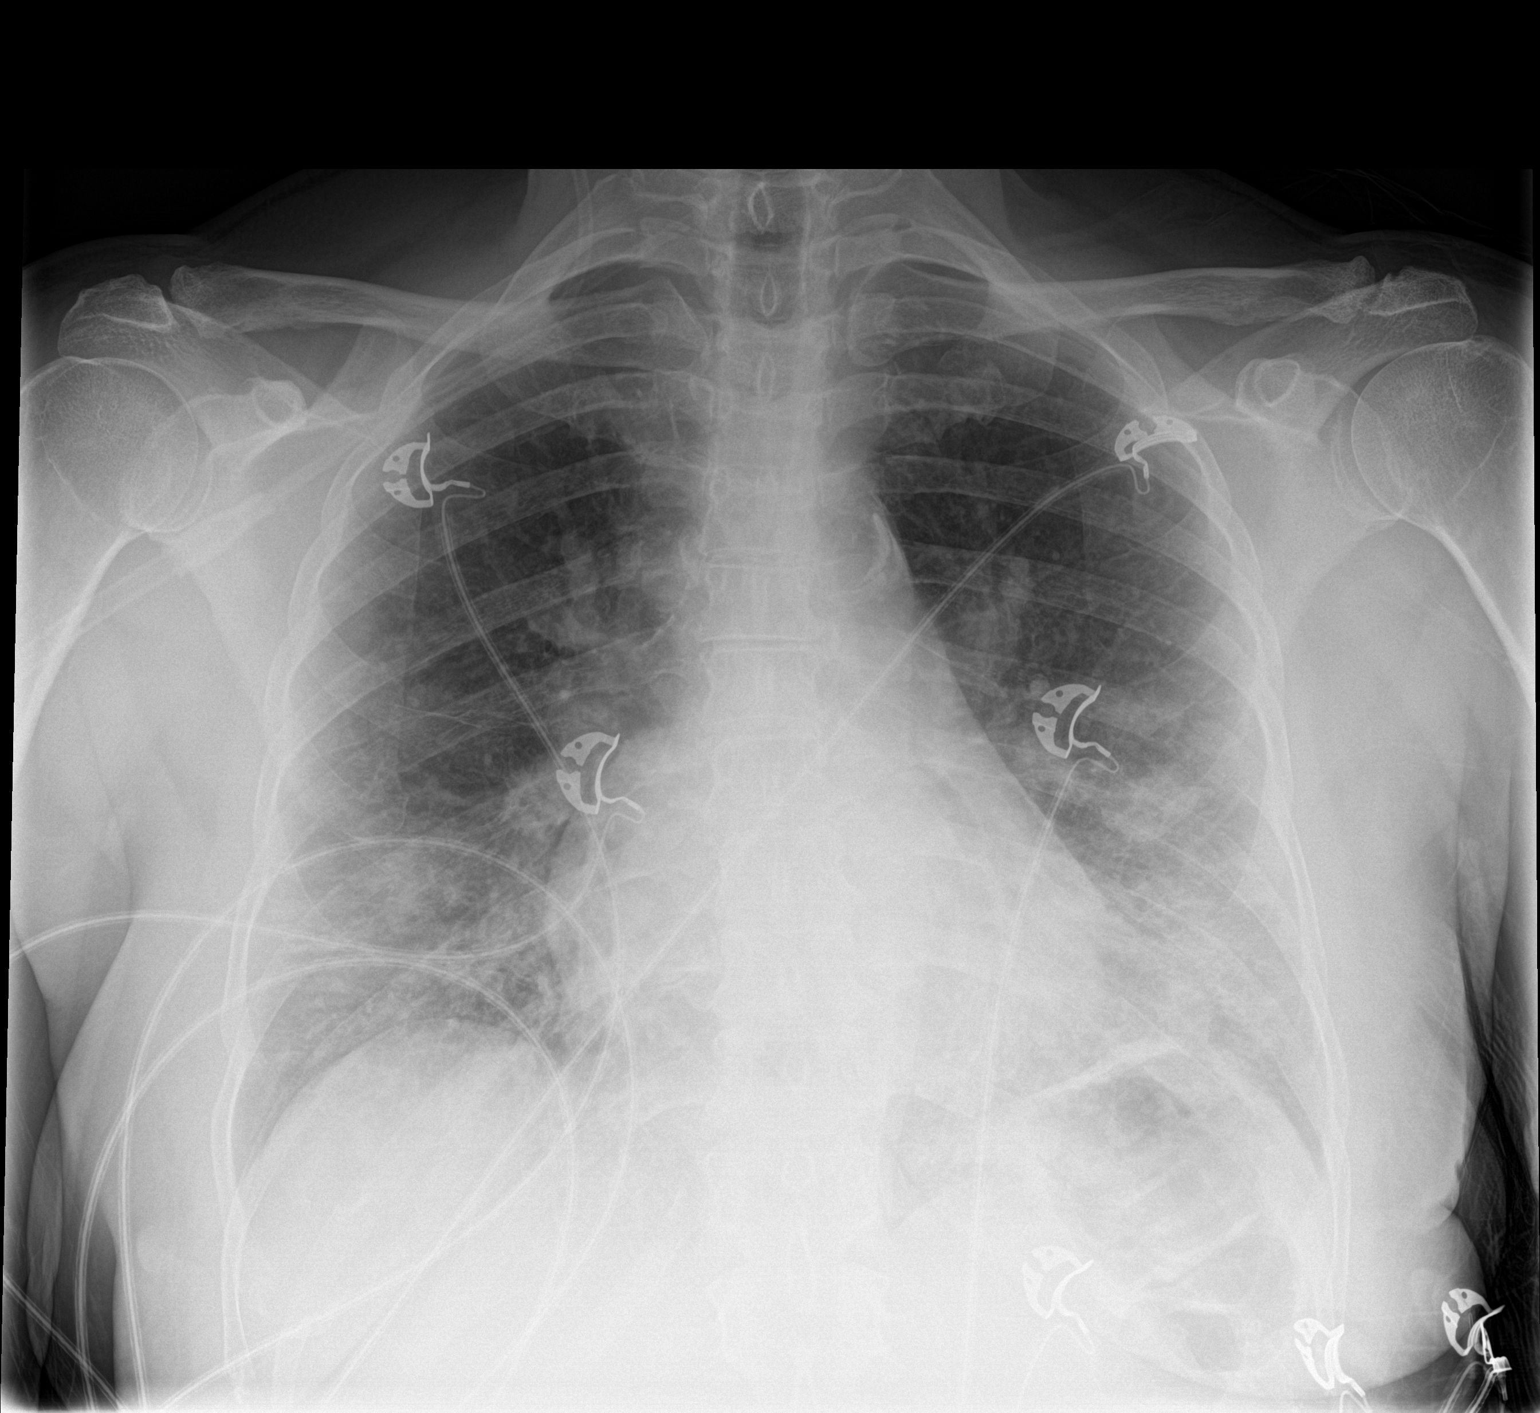

[1 of 1 positions shown; findings below may reference images not displayed]

FINDINGS: Patchy bilateral opacities primarily affecting mid to lower lung
zones. No pleural effusion or pneumothorax. Cardiomediastinal
contours are within normal limits for technique. There is calcified
plaque along the aortic arch.
IMPRESSION: Patchy bilateral opacities likely reflecting [CX] pneumonia.

## 2019-04-27 IMAGING — CT CT ANGIO CHEST
2 of 8 series · 19 of 36 positions shown · IV contrast (Omnipaque)
Comparison: [DATE]

CLINICAL DATA: [7C] diagnosis [DATE], short of breath and
fever, body aches

EXAM:
CT ANGIOGRAPHY CHEST WITH CONTRAST
TECHNIQUE: Multidetector CT imaging of the chest was performed using the
standard protocol during bolus administration of intravenous
contrast. Multiplanar CT image reconstructions and MIPs were
obtained to evaluate the vascular anatomy.
CONTRAST:  100mL OMNIPAQUE IOHEXOL 350 MG/ML SOLN

[Series 6: pe coronal mpr · coronal · 0.54mm/px · 1 of 132 slices shown]
[im 66/132  mediastinal]
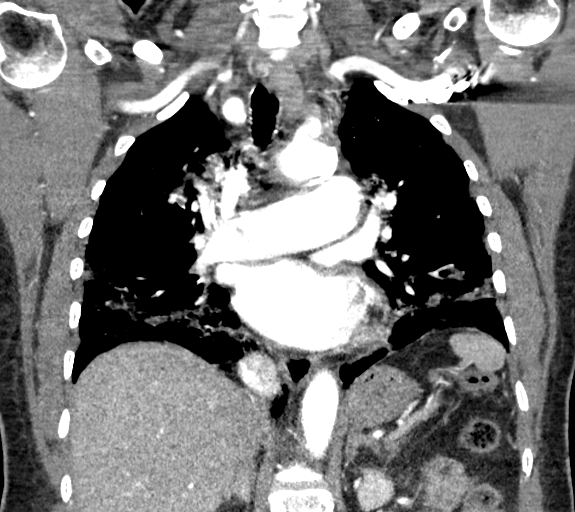

[Series 10: pe thins · axial · 0.69mm/px · z∈[-189,+75]mm · 18 of 296 slices shown]
[im 16/296  lung]
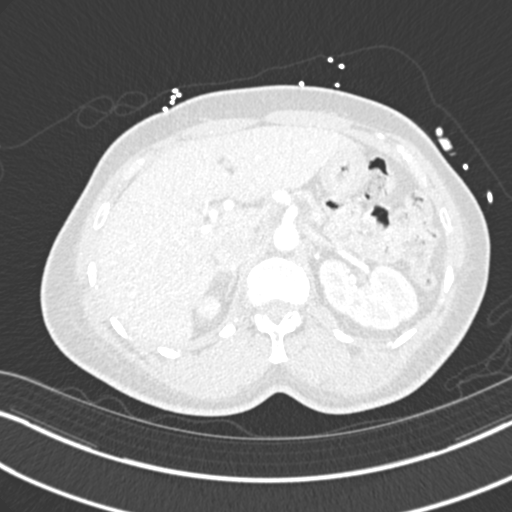
[im 32/296  mediastinal]
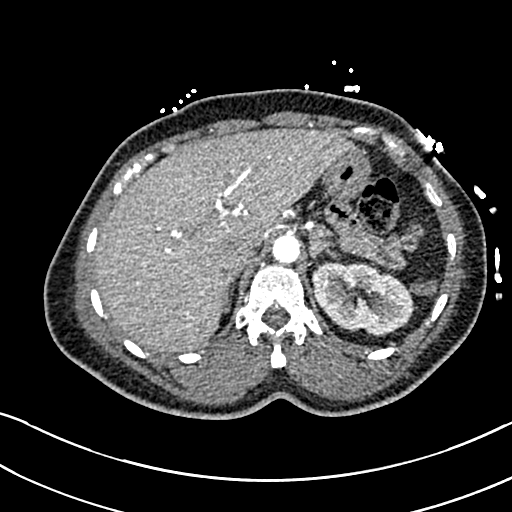
[im 47/296  lung]
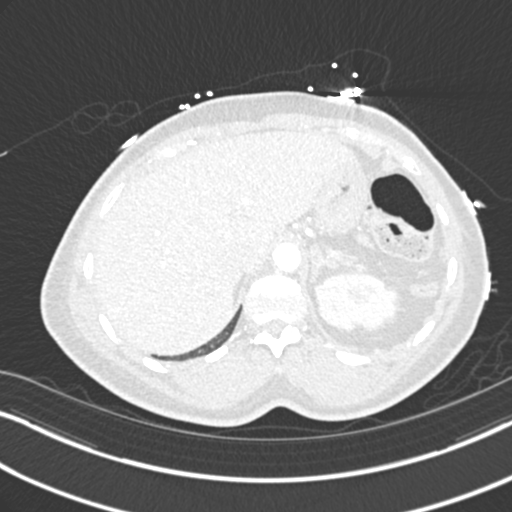
[im 63/296  mediastinal]
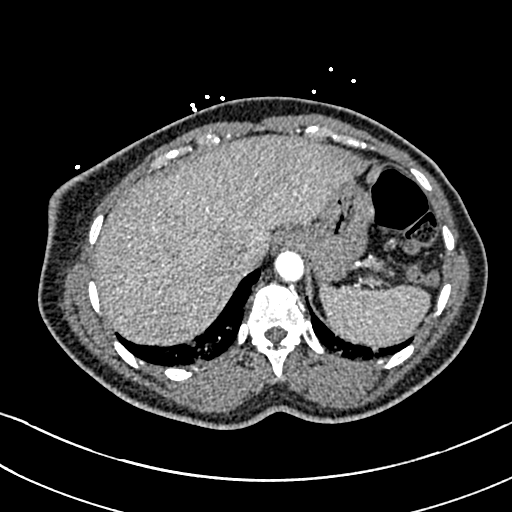
[im 78/296  lung]
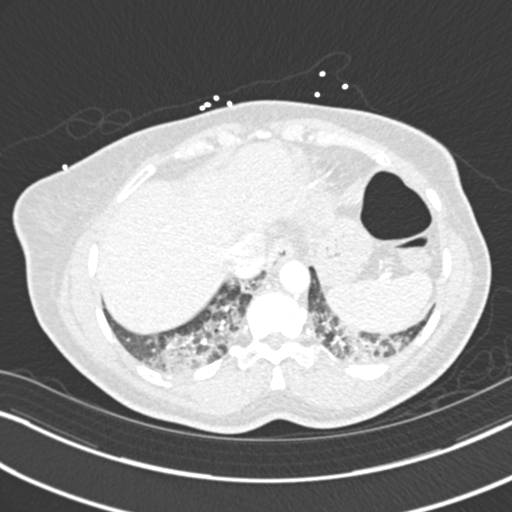
[im 94/296  mediastinal]
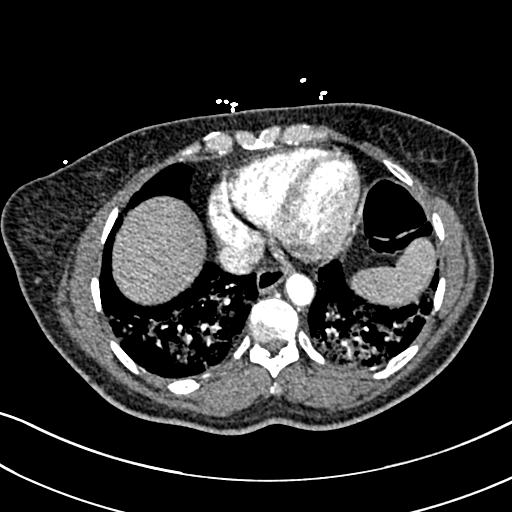
[im 109/296  lung]
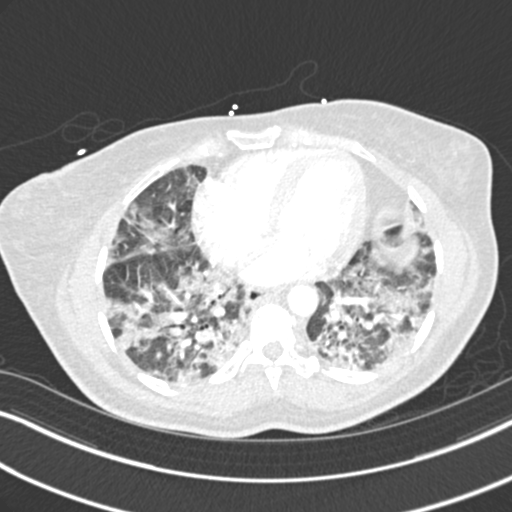
[im 125/296  mediastinal]
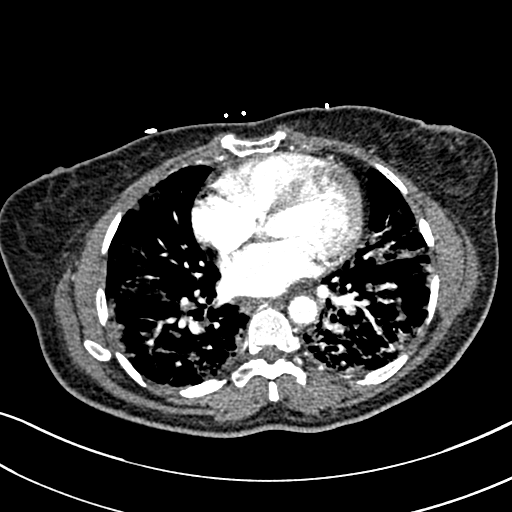
[im 140/296  lung]
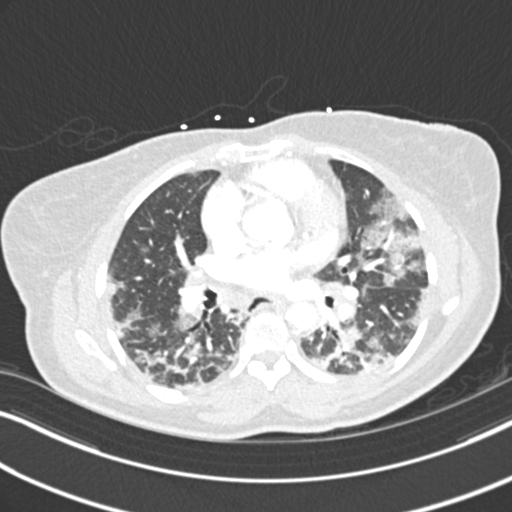
[im 156/296  mediastinal]
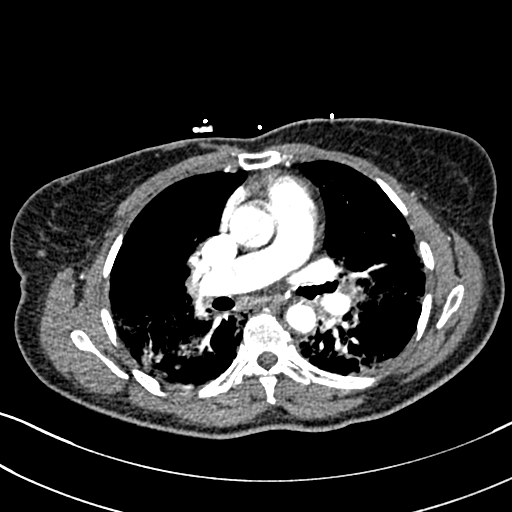
[im 171/296  lung]
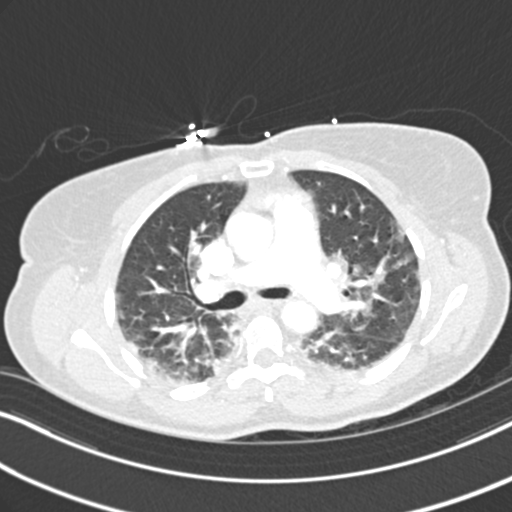
[im 187/296  mediastinal]
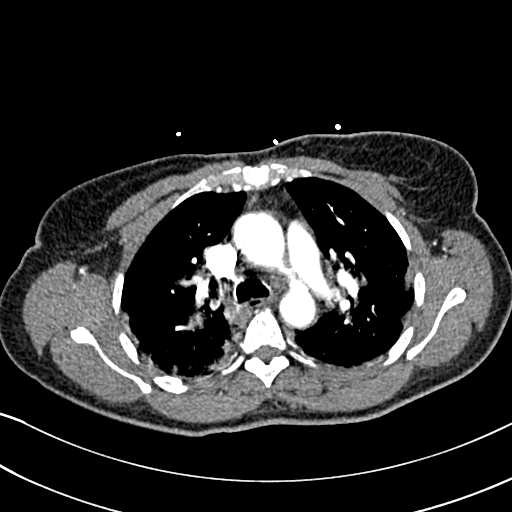
[im 202/296  lung]
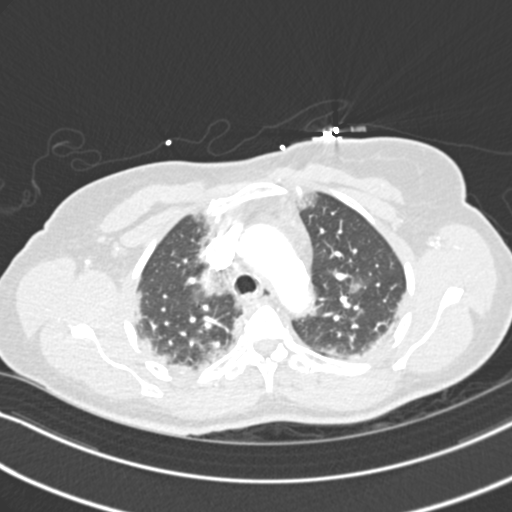
[im 218/296  mediastinal]
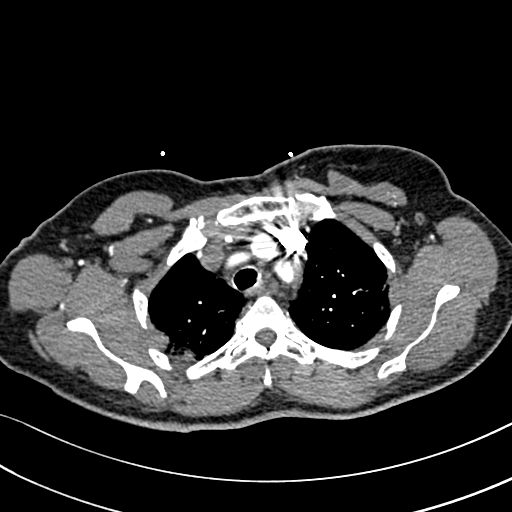
[im 233/296  lung]
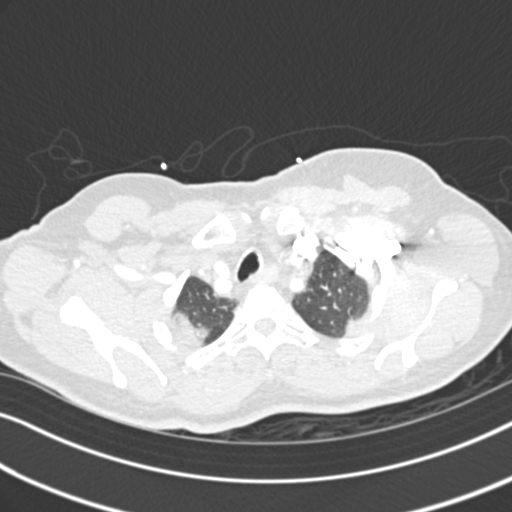
[im 249/296  mediastinal]
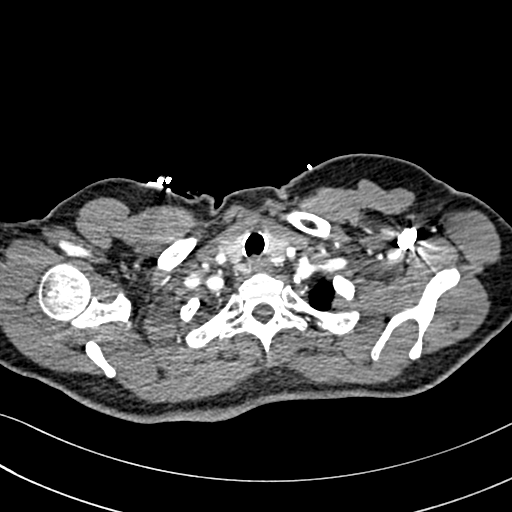
[im 264/296  lung]
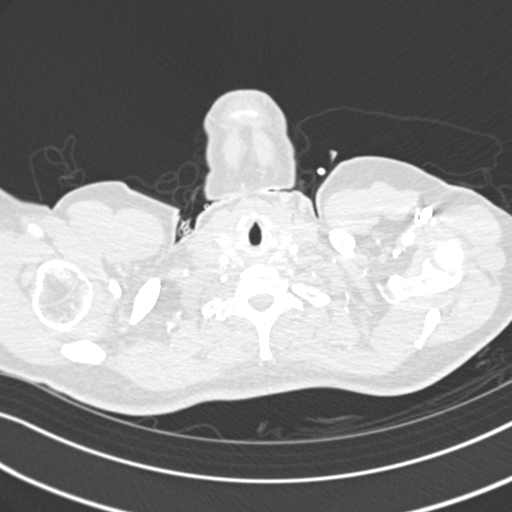
[im 280/296  mediastinal]
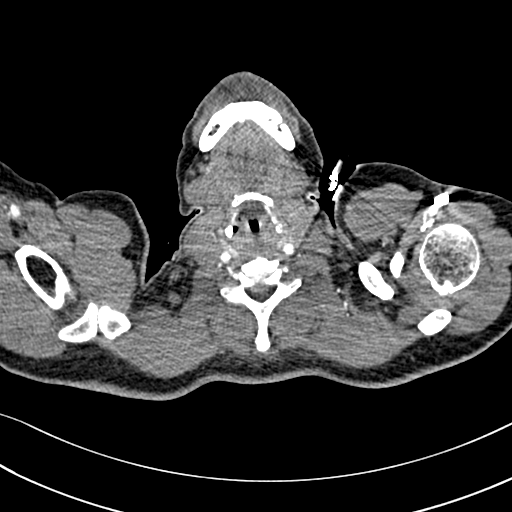

[19 of 36 positions shown; findings below may reference images not displayed]

FINDINGS: Cardiovascular: Evaluation is limited by respiratory motion. There
is adequate opacification of the pulmonary vasculature. There are
bilateral lower lobe segmental pulmonary emboli. Clot burden is
minimal. No evidence of right heart strain.

No pericardial effusion. Normal caliber of the thoracic aorta.
Moderate atherosclerosis of the aortic arch and coronary vessels.

Mediastinum/Nodes: Subcentimeter mediastinal lymph nodes are likely
reactive. Thyroid, trachea, and esophagus are unremarkable.

Lungs/Pleura: Multifocal bilateral airspace disease, greatest at the
lung bases, consistent with [7C] pneumonia. No effusion or
pneumothorax. Central airways are patent.

Upper Abdomen: No acute abnormality.

Musculoskeletal: No acute or destructive bony lesions. Reconstructed
images demonstrate no additional findings.

Review of the MIP images confirms the above findings.
IMPRESSION: 1. Bilateral lower lobe segmental pulmonary emboli. Minimal clot
burden, with no evidence of right heart strain.
2. Multifocal bilateral airspace disease consistent with [7C]
pneumonia.

These results were called by telephone at the time of interpretation
on [DATE] at [DATE] to provider NETO , who verbally
acknowledged these results.

## 2019-04-27 MED ORDER — KETOROLAC TROMETHAMINE 15 MG/ML IJ SOLN
15.0000 mg | Freq: Once | INTRAMUSCULAR | Status: AC
  Administered 2019-04-27: 15 mg via INTRAVENOUS
  Filled 2019-04-27: qty 1

## 2019-04-27 MED ORDER — IOHEXOL 350 MG/ML SOLN
100.0000 mL | Freq: Once | INTRAVENOUS | Status: AC | PRN
  Administered 2019-04-27: 100 mL via INTRAVENOUS

## 2019-04-27 MED ORDER — SODIUM CHLORIDE 0.9 % IV SOLN
100.0000 mg | Freq: Once | INTRAVENOUS | Status: AC
  Administered 2019-04-27: 100 mg via INTRAVENOUS
  Filled 2019-04-27: qty 20

## 2019-04-27 MED ORDER — HYDROCOD POLST-CPM POLST ER 10-8 MG/5ML PO SUER
5.0000 mL | Freq: Once | ORAL | Status: AC
  Administered 2019-04-27: 5 mL via ORAL
  Filled 2019-04-27: qty 5

## 2019-04-27 MED ORDER — HEPARIN BOLUS VIA INFUSION
5000.0000 [IU] | Freq: Once | INTRAVENOUS | Status: AC
  Administered 2019-04-27: 22:00:00 5000 [IU] via INTRAVENOUS

## 2019-04-27 MED ORDER — SODIUM CHLORIDE 0.9 % IV SOLN
100.0000 mg | Freq: Every day | INTRAVENOUS | Status: DC
  Administered 2019-04-28 – 2019-04-30 (×3): 100 mg via INTRAVENOUS
  Filled 2019-04-27 (×3): qty 20

## 2019-04-27 MED ORDER — DEXAMETHASONE SODIUM PHOSPHATE 10 MG/ML IJ SOLN
6.0000 mg | Freq: Once | INTRAMUSCULAR | Status: AC
  Administered 2019-04-27: 6 mg via INTRAVENOUS
  Filled 2019-04-27: qty 1

## 2019-04-27 MED ORDER — HEPARIN (PORCINE) 25000 UT/250ML-% IV SOLN
1300.0000 [IU]/h | INTRAVENOUS | Status: DC
  Administered 2019-04-27 – 2019-04-28 (×2): 1300 [IU]/h via INTRAVENOUS
  Filled 2019-04-27 (×2): qty 250

## 2019-04-27 MED ORDER — ACETAMINOPHEN 500 MG PO TABS
1000.0000 mg | ORAL_TABLET | Freq: Once | ORAL | Status: AC
  Administered 2019-04-27: 19:00:00 1000 mg via ORAL
  Filled 2019-04-27: qty 2

## 2019-04-27 MED ORDER — SODIUM CHLORIDE 0.9 % IV SOLN
100.0000 mg | Freq: Once | INTRAVENOUS | Status: AC
  Administered 2019-04-27: 21:00:00 100 mg via INTRAVENOUS
  Filled 2019-04-27: qty 20

## 2019-04-27 MED ORDER — ONDANSETRON HCL 4 MG/2ML IJ SOLN
4.0000 mg | Freq: Once | INTRAMUSCULAR | Status: AC
  Administered 2019-04-27: 4 mg via INTRAVENOUS
  Filled 2019-04-27: qty 2

## 2019-04-27 NOTE — ED Triage Notes (Signed)
DX covid 4/8. Today  SOB fever, body aches.

## 2019-04-27 NOTE — Progress Notes (Signed)
ANTICOAGULATION CONSULT NOTE   Pharmacy Consult for Heparin Indication: pulmonary embolus  No Known Allergies  Patient Measurements: Height: 5' (152.4 cm) Weight: 78.5 kg (173 lb) IBW/kg (Calculated) : 45.5  Vital Signs: Temp: 99.1 F (37.3 C) (04/14 1814) Temp Source: Oral (04/14 1814) BP: 140/69 (04/14 2100) Pulse Rate: 89 (04/14 2100)  Labs: Recent Labs    04/27/19 1915  HGB 10.8*  HCT 33.6*  PLT 246  CREATININE 1.12*    Estimated Creatinine Clearance: 48.9 mL/min (A) (by C-G formula based on SCr of 1.12 mg/dL (H)).  Assessment: 64 year old female to begin heparin for pulmonary embolus   Goal of Therapy:  Heparin level 0.3-0.7 units/ml Monitor platelets by anticoagulation protocol: Yes   Plan:  Heparin 5000 units iv bolus x 1 Heparin drip at 1300 units / hr Heparin level and CBC 6 hours after heparin starts Daily heparin level, CBC  Thank you Okey Regal, PharmD  04/27/2019,9:58 PM

## 2019-04-27 NOTE — ED Provider Notes (Signed)
[facility] EMERGENCY DEPARTMENT Provider Note   CSN: [id number] Arrival date & time: 04/27/19  1801     History Chief Complaint  Patient presents with  . Fever    + Covid    Diane Willis is a 64 y.o. female.  64 yo F with a chief complaint of fevers and chills.  Patient states that she was diagnosed with novel coronavirus about 6 days ago.  Had had very mild symptoms at that time and then has gotten worse over the past couple days.  She had one episode of diarrhea.  Denies nausea or vomiting.  Denies chest pain shortness of breath abdominal pain.  The history is provided by the patient.  Fever Associated symptoms: cough   Associated symptoms: no chest pain, no chills, no congestion, no dysuria, no headaches, no myalgias, no nausea, no rhinorrhea and no vomiting   Illness Severity:  Moderate Onset quality:  Gradual Duration:  1 week Timing:  Constant Progression:  Worsening Chronicity:  New Associated symptoms: cough and fever   Associated symptoms: no chest pain, no congestion, no headaches, no myalgias, no nausea, no rhinorrhea, no shortness of breath, no vomiting and no wheezing        Past Medical History:  Diagnosis Date  . Diabetes mellitus without complication (HCC)   . Hypertension     There are no problems to display for this patient.   Past Surgical History:  Procedure Laterality Date  . ABDOMINAL HYSTERECTOMY       OB History   No obstetric history on file.     History reviewed. No pertinent family history.  Social History   Tobacco Use  . Smoking status: Never Smoker  . Smokeless tobacco: Never Used  Substance Use Topics  . Alcohol use: No  . Drug use: No    Home Medications Prior to Admission medications   Medication Sig Start Date End Date Taking? Authorizing Provider  aspirin 81 MG tablet Take 81 mg by mouth daily.    [provider]  ciprofloxacin (CIPRO) 500 MG tablet Take 1 tablet (500 mg total) by mouth  every 12 (twelve) hours. 11/07/14   Horton, Mayer Masker, MD  fluconazole (DIFLUCAN) 150 MG tablet Take 1 tablet (150 mg total) by mouth once. 11/19/14   Arby Barrette, MD  insulin aspart protamine- aspart (NOVOLOG MIX 70/30) (70-30) 100 UNIT/ML injection Inject into the skin.    [provider]  LISINOPRIL PO Take by mouth.    [provider]  nitrofurantoin, macrocrystal-monohydrate, (MACROBID) 100 MG capsule Take 1 capsule (100 mg total) by mouth 2 (two) times daily. 11/19/14   Arby Barrette, MD    Allergies    Patient has no known allergies.  Review of Systems   Review of Systems  Constitutional: Positive for fever. Negative for chills.  HENT: Negative for congestion and rhinorrhea.   Eyes: Negative for redness and visual disturbance.  Respiratory: Positive for cough. Negative for shortness of breath and wheezing.   Cardiovascular: Negative for chest pain and palpitations.  Gastrointestinal: Negative for nausea and vomiting.  Genitourinary: Negative for dysuria and urgency.  Musculoskeletal: Negative for arthralgias and myalgias.  Skin: Negative for pallor and wound.  Neurological: Negative for dizziness and headaches.    Physical Exam Updated Vital Signs BP 140/69   Pulse 89   Temp 99.1 F (37.3 C) (Oral)   Resp 16   Ht 5' (1.524 m)   Wt 78.5 kg   SpO2 91%  BMI 33.79 kg/m   Physical Exam Vitals and nursing note reviewed.  Constitutional:      General: She is not in acute distress.    Appearance: She is well-developed. She is not diaphoretic.  HENT:     Head: Normocephalic and atraumatic.  Eyes:     Pupils: Pupils are equal, round, and reactive to light.  Cardiovascular:     Rate and Rhythm: Regular rhythm. Tachycardia present.     Heart sounds: No murmur. No friction rub. No gallop.   Pulmonary:     Effort: Pulmonary effort is normal.     Breath sounds: No wheezing or rales.  Abdominal:     General: There is no distension.     Palpations:  Abdomen is soft.     Tenderness: There is no abdominal tenderness.  Musculoskeletal:        General: No tenderness.     Cervical back: Normal range of motion and neck supple.  Skin:    General: Skin is warm and dry.  Neurological:     Mental Status: She is alert and oriented to person, place, and time.  Psychiatric:        Behavior: Behavior normal.     ED Results / Procedures / Treatments   Labs (all labs ordered are listed, but only abnormal results are displayed) Labs Reviewed  CBC WITH DIFFERENTIAL/PLATELET - Abnormal; Notable for the following components:      Result Value   RBC 3.47 (*)    Hemoglobin 10.8 (*)    HCT 33.6 (*)    nRBC 0.5 (*)    Abs Immature Granulocytes 0.10 (*)    All other components within normal limits  LACTIC ACID, PLASMA - Abnormal; Notable for the following components:   Lactic Acid, Venous 2.3 (*)    All other components within normal limits  COMPREHENSIVE METABOLIC PANEL - Abnormal; Notable for the following components:   Sodium 130 (*)    Chloride 91 (*)    Glucose, Bld 152 (*)    BUN 25 (*)    Creatinine, Ser 1.12 (*)    Calcium 8.7 (*)    Albumin 3.3 (*)    GFR calc non Af Amer 53 (*)    All other components within normal limits  D-DIMER, QUANTITATIVE (NOT AT [facility]) - Abnormal; Notable for the following components:   D-Dimer, Quant >20.00 (*)    All other components within normal limits  CBG MONITORING, ED - Abnormal; Notable for the following components:   Glucose-Capillary 158 (*)    All other components within normal limits  CULTURE, BLOOD (ROUTINE X 2)  CULTURE, BLOOD (ROUTINE X 2)  LACTIC ACID, PLASMA  PROCALCITONIN  LACTATE DEHYDROGENASE  FERRITIN  TRIGLYCERIDES  FIBRINOGEN  C-REACTIVE PROTEIN    EKG EKG Interpretation  Date/Time:  Wednesday April 27 2019 18:20:22 EDT Ventricular Rate:  108 PR Interval:    QRS Duration: 74 QT Interval:  320 QTC Calculation: 429 R Axis:   21 Text Interpretation: Sinus tachycardia  Abnormal R-wave progression, early transition Borderline T abnormalities, lateral leads Baseline wander in lead(s) II III aVF duplicate please delete Confirmed by Melene Plan [phone]) on 04/27/2019 9:12:14 PM   Radiology CT Angio Chest PE W and/or Wo Contrast  Result Date: 04/27/2019 CLINICAL DATA:  COVID-19 diagnosis 04/21/2019, short of breath and fever, body aches EXAM: CT ANGIOGRAPHY CHEST WITH CONTRAST TECHNIQUE: Multidetector CT imaging of the chest was performed using the standard protocol during bolus administration of intravenous contrast. Multiplanar  CT image reconstructions and MIPs were obtained to evaluate the vascular anatomy. CONTRAST:  OMNIPAQUE IOHEXOL 350 MG/ML SOLN COMPARISON:  04/27/2019 FINDINGS: Cardiovascular: Evaluation is limited by respiratory motion. There is adequate opacification of the pulmonary vasculature. There are bilateral lower lobe segmental pulmonary emboli. Clot burden is minimal. No evidence of right heart strain. No pericardial effusion. Normal caliber of the thoracic aorta. Moderate atherosclerosis of the aortic arch and coronary vessels. Mediastinum/Nodes: Subcentimeter mediastinal lymph nodes are likely reactive. Thyroid, trachea, and esophagus are unremarkable. Lungs/Pleura: Multifocal bilateral airspace disease, greatest at the lung bases, consistent with COVID-19 pneumonia. No effusion or pneumothorax. Central airways are patent. Upper Abdomen: No acute abnormality. Musculoskeletal: No acute or destructive bony lesions. Reconstructed images demonstrate no additional findings. Review of the MIP images confirms the above findings. IMPRESSION: 1. Bilateral lower lobe segmental pulmonary emboli. Minimal clot burden, with no evidence of right heart strain. 2. Multifocal bilateral airspace disease consistent with COVID-19 pneumonia. These results were called by telephone at the time of interpretation on 04/27/2019 at 9:42 pm to provider Cameran Pettey , who verbally  acknowledged these results. Electronically Signed   By: Sharlet Salina M.D.   On: 04/27/2019 21:42   DG Chest Portable 1 View  Result Date: 04/27/2019 CLINICAL DATA:  COVID, shortness of breath EXAM: PORTABLE CHEST 1 VIEW COMPARISON:  None. FINDINGS: Patchy bilateral opacities primarily affecting mid to lower lung zones. No pleural effusion or pneumothorax. Cardiomediastinal contours are within normal limits for technique. There is calcified plaque along the aortic arch. IMPRESSION: Patchy bilateral opacities likely reflecting COVID-19 pneumonia. Electronically Signed   By: Guadlupe Spanish M.D.   On: 04/27/2019 19:37    Procedures Procedures (including critical care time)  Medications Ordered in ED Medications  remdesivir 100 mg in sodium chloride 0.9 % 100 mL IVPB (0 mg Intravenous Stopped 04/27/19 2115)    Followed by  remdesivir 100 mg in sodium chloride 0.9 % 100 mL IVPB (100 mg Intravenous New Bag/Given 04/27/19 2139)  remdesivir 100 mg in sodium chloride 0.9 % 100 mL IVPB (has no administration in time range)  acetaminophen (TYLENOL) tablet 1,000 mg (1,000 mg Oral Given 04/27/19 1905)  ketorolac (TORADOL) 15 MG/ML injection 15 mg (15 mg Intravenous Given 04/27/19 1904)  chlorpheniramine-HYDROcodone (TUSSIONEX) 10-8 MG/5ML suspension 5 mL (5 mLs Oral Given 04/27/19 1905)  ondansetron (ZOFRAN) injection 4 mg (4 mg Intravenous Given 04/27/19 1930)  dexamethasone (DECADRON) injection 6 mg (6 mg Intravenous Given 04/27/19 2035)  iohexol (OMNIPAQUE) 350 MG/ML injection 100 mL (100 mLs Intravenous Contrast Given 04/27/19 2115)    ED Course  I have reviewed the triage vital signs and the nursing notes.  Pertinent labs & imaging results that were available during my care of the patient were reviewed by me and considered in my medical decision making (see chart for details).    MDM Rules/Calculators/A&P                      64 yo F with a chief complaint of a fever.  Patient was diagnosed with  novel coronavirus about 8 days ago.  Had worsening over the past 48 hours.  Interestingly the patient was here really for fevers though she was found to be profoundly hypoxic with minimal exertion.  Went down into the low 70s with a heart rate jumping into the 130s.  Improved on 2 L of oxygen.  No significant increased work of breathing.  No abdominal tenderness.  We will send  off lab work discussed with hospitalist for admission.  I discussed case with Dr. Alcario Drought, he was concerned about the patient's significantly elevated D-dimer and recommended a CT angiogram to evaluate for pulmonary embolism and this could change the patient's bed status as an inpatient.  CT scan shows subsegmental PEs with no right heart strain.  Will start on heparin.  Will admit to stepdown at [facility].  CRITICAL CARE Performed by: Cecilio Asper   Total critical care time: 80 minutes  Critical care time was exclusive of separately billable procedures and treating other patients.  Critical care was necessary to treat or prevent imminent or life-threatening deterioration.  Critical care was time spent personally by me on the following activities: development of treatment plan with patient and/or surrogate as well as nursing, discussions with consultants, evaluation of patient's response to treatment, examination of patient, obtaining history from patient or surrogate, ordering and performing treatments and interventions, ordering and review of laboratory studies, ordering and review of radiographic studies, pulse oximetry and re-evaluation of patient's condition.  The patients results and plan were reviewed and discussed.   Any x-rays performed were independently reviewed by myself.   Differential diagnosis were considered with the presenting HPI.  Medications  remdesivir 100 mg in sodium chloride 0.9 % 100 mL IVPB (0 mg Intravenous Stopped 04/27/19 2115)    Followed by  remdesivir 100 mg in sodium chloride  0.9 % 100 mL IVPB (100 mg Intravenous New Bag/Given 04/27/19 2139)  remdesivir 100 mg in sodium chloride 0.9 % 100 mL IVPB (has no administration in time range)  acetaminophen (TYLENOL) tablet 1,000 mg (1,000 mg Oral Given 04/27/19 1905)  ketorolac (TORADOL) 15 MG/ML injection 15 mg (15 mg Intravenous Given 04/27/19 1904)  chlorpheniramine-HYDROcodone (TUSSIONEX) 10-8 MG/5ML suspension 5 mL (5 mLs Oral Given 04/27/19 1905)  ondansetron (ZOFRAN) injection 4 mg (4 mg Intravenous Given 04/27/19 1930)  dexamethasone (DECADRON) injection 6 mg (6 mg Intravenous Given 04/27/19 2035)  iohexol (OMNIPAQUE) 350 MG/ML injection 100 mL (100 mLs Intravenous Contrast Given 04/27/19 2115)    Vitals:   04/27/19 1932 04/27/19 2000 04/27/19 2030 04/27/19 2100  BP: (!) 145/65 140/63 140/65 140/69  Pulse: 100 95 90 89  Resp: 14 14 16 16   Temp:      TempSrc:      SpO2: 92% 91% 90% 91%  Weight:      Height:        Final diagnoses:  Acute respiratory failure with hypoxia (HCC)  Pneumonia due to COVID-19 virus  Multiple subsegmental pulmonary emboli without acute cor pulmonale ([place])    Admission/ observation were discussed with the admitting physician, patient and/or family and they are comfortable with the plan.    Final Clinical Impression(s) / ED Diagnoses Final diagnoses:  Acute respiratory failure with hypoxia ([place])  Pneumonia due to COVID-19 virus  Multiple subsegmental pulmonary emboli without acute cor pulmonale [facility])    Rx / DC Orders ED Discharge Orders    None       Deno Etienne, DO 04/27/19 2155

## 2019-04-27 NOTE — ED Notes (Signed)
Patient transported to CT 

## 2019-04-27 NOTE — Plan of Care (Signed)
Pt with COVID and new O2 requirement.  Has D.Dimer >20.00!  Creat 1.1.  Spoke with Dr. Adela Lank: given very high D.Dimer and new O2 requirement, am concerned about possible sizeable PE.  Recommended stat CTA chest which he is getting now.  If large PE / PE with RHS present, pt should go to SDU at [facility] due to availability of EKOS if needed.  Otherwise if PE not present, then can take here at [facility].

## 2019-04-27 NOTE — Plan of Care (Addendum)
CTA: Bilateral PEs present, though small clot burden without CT evidence of RHS.  Start heparin gtt per pharm.  No SDU beds at [facility].  I am told [facility] DOES have a COVID SDU bed available currently by patient placement.  Will put in for SDU bed at [facility] for COVID PNA, hypoxia, and B PEs.  COVID-19 positive test is visible in Care everywhere from [facility].  04/21/2019.

## 2019-04-28 ENCOUNTER — Inpatient Hospital Stay (HOSPITAL_COMMUNITY): Payer: BC Managed Care – PPO

## 2019-04-28 ENCOUNTER — Encounter (HOSPITAL_COMMUNITY): Payer: Self-pay | Admitting: Internal Medicine

## 2019-04-28 DIAGNOSIS — I2699 Other pulmonary embolism without acute cor pulmonale: Secondary | ICD-10-CM

## 2019-04-28 DIAGNOSIS — R609 Edema, unspecified: Secondary | ICD-10-CM

## 2019-04-28 DIAGNOSIS — I340 Nonrheumatic mitral (valve) insufficiency: Secondary | ICD-10-CM | POA: Diagnosis not present

## 2019-04-28 DIAGNOSIS — J9601 Acute respiratory failure with hypoxia: Secondary | ICD-10-CM | POA: Diagnosis present

## 2019-04-28 DIAGNOSIS — I10 Essential (primary) hypertension: Secondary | ICD-10-CM | POA: Diagnosis present

## 2019-04-28 DIAGNOSIS — I2694 Multiple subsegmental pulmonary emboli without acute cor pulmonale: Secondary | ICD-10-CM

## 2019-04-28 DIAGNOSIS — I371 Nonrheumatic pulmonary valve insufficiency: Secondary | ICD-10-CM | POA: Diagnosis not present

## 2019-04-28 DIAGNOSIS — U071 COVID-19: Principal | ICD-10-CM

## 2019-04-28 DIAGNOSIS — D649 Anemia, unspecified: Secondary | ICD-10-CM | POA: Diagnosis present

## 2019-04-28 DIAGNOSIS — N179 Acute kidney failure, unspecified: Secondary | ICD-10-CM | POA: Diagnosis present

## 2019-04-28 LAB — COMPREHENSIVE METABOLIC PANEL
ALT: 27 U/L (ref 0–44)
AST: 33 U/L (ref 15–41)
Albumin: 3 g/dL — ABNORMAL LOW (ref 3.5–5.0)
Alkaline Phosphatase: 66 U/L (ref 38–126)
Anion gap: 17 — ABNORMAL HIGH (ref 5–15)
BUN: 23 mg/dL (ref 8–23)
CO2: 22 mmol/L (ref 22–32)
Calcium: 8.7 mg/dL — ABNORMAL LOW (ref 8.9–10.3)
Chloride: 91 mmol/L — ABNORMAL LOW (ref 98–111)
Creatinine, Ser: 1.17 mg/dL — ABNORMAL HIGH (ref 0.44–1.00)
GFR calc Af Amer: 58 mL/min — ABNORMAL LOW (ref >60)
GFR calc non Af Amer: 50 mL/min — ABNORMAL LOW (ref >60)
Glucose, Bld: 185 mg/dL — ABNORMAL HIGH (ref 70–99)
Potassium: 4.2 mmol/L (ref 3.5–5.1)
Sodium: 130 mmol/L — ABNORMAL LOW (ref 135–145)
Total Bilirubin: 0.8 mg/dL (ref 0.3–1.2)
Total Protein: 7.4 g/dL (ref 6.5–8.1)

## 2019-04-28 LAB — CBC WITH DIFFERENTIAL/PLATELET
Abs Immature Granulocytes: 0.3 10*3/uL — ABNORMAL HIGH (ref 0.00–0.07)
Basophils Absolute: 0 10*3/uL (ref 0.0–0.1)
Basophils Relative: 0 %
Eosinophils Absolute: 0 10*3/uL (ref 0.0–0.5)
Eosinophils Relative: 0 %
HCT: 35.2 % — ABNORMAL LOW (ref 36.0–46.0)
Hemoglobin: 11.5 g/dL — ABNORMAL LOW (ref 12.0–15.0)
Lymphocytes Relative: 11 %
Lymphs Abs: 0.6 10*3/uL — ABNORMAL LOW (ref 0.7–4.0)
MCH: 30.9 pg (ref 26.0–34.0)
MCHC: 32.7 g/dL (ref 30.0–36.0)
MCV: 94.6 fL (ref 80.0–100.0)
Metamyelocytes Relative: 1 %
Monocytes Absolute: 0.1 10*3/uL (ref 0.1–1.0)
Monocytes Relative: 1 %
Myelocytes: 3 %
Neutro Abs: 4.6 10*3/uL (ref 1.7–7.7)
Neutrophils Relative %: 83 %
Platelets: 298 10*3/uL (ref 150–400)
Promyelocytes Relative: 1 %
RBC: 3.72 MIL/uL — ABNORMAL LOW (ref 3.87–5.11)
RDW: 12.3 % (ref 11.5–15.5)
WBC: 5.5 10*3/uL (ref 4.0–10.5)
nRBC: 0 /100 WBC
nRBC: 0.5 % — ABNORMAL HIGH (ref 0.0–0.2)

## 2019-04-28 LAB — D-DIMER, QUANTITATIVE: D-Dimer, Quant: 11.06 ug/mL-FEU — ABNORMAL HIGH (ref 0.00–0.50)

## 2019-04-28 LAB — GLUCOSE, CAPILLARY
Glucose-Capillary: 182 mg/dL — ABNORMAL HIGH (ref 70–99)
Glucose-Capillary: 316 mg/dL — ABNORMAL HIGH (ref 70–99)
Glucose-Capillary: 159 mg/dL — ABNORMAL HIGH (ref 70–99)
Glucose-Capillary: 146 mg/dL — ABNORMAL HIGH (ref 70–99)
Glucose-Capillary: 123 mg/dL — ABNORMAL HIGH (ref 70–99)

## 2019-04-28 LAB — PROCALCITONIN: Procalcitonin: 0.71 ng/mL

## 2019-04-28 LAB — ECHOCARDIOGRAM COMPLETE
Height: 60 in
Weight: 2772.5 oz

## 2019-04-28 LAB — HEPARIN LEVEL (UNFRACTIONATED)
Heparin Unfractionated: 1.12 IU/mL — ABNORMAL HIGH (ref 0.30–0.70)
Heparin Unfractionated: 0.87 IU/mL — ABNORMAL HIGH (ref 0.30–0.70)

## 2019-04-28 LAB — ABO/RH: ABO/RH(D): A POS

## 2019-04-28 LAB — HIV ANTIBODY (ROUTINE TESTING W REFLEX): HIV Screen 4th Generation wRfx: NONREACTIVE

## 2019-04-28 LAB — APTT: aPTT: 171 seconds (ref 24–36)

## 2019-04-28 LAB — C-REACTIVE PROTEIN: CRP: 21.9 mg/dL — ABNORMAL HIGH (ref <1.0)

## 2019-04-28 LAB — TROPONIN I (HIGH SENSITIVITY): Troponin I (High Sensitivity): 17 ng/L (ref <18)

## 2019-04-28 LAB — FERRITIN: Ferritin: 105 ng/mL (ref 11–307)

## 2019-04-28 MED ORDER — ONDANSETRON HCL 4 MG PO TABS: 4.0000 mg | ORAL_TABLET | Freq: Four times a day (QID) | ORAL | Status: DC | PRN

## 2019-04-28 MED ORDER — INSULIN ASPART PROT & ASPART (70-30 MIX) 100 UNIT/ML ~~LOC~~ SUSP
25.0000 [IU] | Freq: Every day | SUBCUTANEOUS | Status: DC
  Administered 2019-04-28 – 2019-04-29 (×2): 25 [IU] via SUBCUTANEOUS

## 2019-04-28 MED ORDER — ONDANSETRON HCL 4 MG/2ML IJ SOLN: 4.0000 mg | Freq: Four times a day (QID) | INTRAMUSCULAR | Status: DC | PRN

## 2019-04-28 MED ORDER — HYDRALAZINE HCL 20 MG/ML IJ SOLN: 10.0000 mg | INTRAMUSCULAR | Status: DC | PRN

## 2019-04-28 MED ORDER — DEXAMETHASONE SODIUM PHOSPHATE 10 MG/ML IJ SOLN
6.0000 mg | INTRAMUSCULAR | Status: DC
  Administered 2019-04-28 – 2019-04-30 (×3): 6 mg via INTRAVENOUS
  Filled 2019-04-28 (×3): qty 1

## 2019-04-28 MED ORDER — INSULIN ASPART 100 UNIT/ML ~~LOC~~ SOLN
0.0000 [IU] | Freq: Three times a day (TID) | SUBCUTANEOUS | Status: DC
  Administered 2019-04-28: 17:00:00 2 [IU] via SUBCUTANEOUS
  Administered 2019-04-28: 12:00:00 7 [IU] via SUBCUTANEOUS
  Administered 2019-04-28 – 2019-04-29 (×2): 2 [IU] via SUBCUTANEOUS
  Administered 2019-04-29: 08:00:00 1 [IU] via SUBCUTANEOUS
  Administered 2019-04-29: 16:00:00 3 [IU] via SUBCUTANEOUS

## 2019-04-28 MED ORDER — HEPARIN (PORCINE) 25000 UT/250ML-% IV SOLN: 850.0000 [IU]/h | INTRAVENOUS | Status: DC

## 2019-04-28 MED ORDER — INSULIN ASPART PROT & ASPART (70-30 MIX) 100 UNIT/ML ~~LOC~~ SUSP
35.0000 [IU] | Freq: Every day | SUBCUTANEOUS | Status: DC
  Administered 2019-04-28 – 2019-04-29 (×2): 35 [IU] via SUBCUTANEOUS
  Filled 2019-04-28: qty 10

## 2019-04-28 MED ORDER — AMLODIPINE BESYLATE 5 MG PO TABS
5.0000 mg | ORAL_TABLET | Freq: Every day | ORAL | Status: DC
  Administered 2019-04-28 – 2019-04-30 (×3): 5 mg via ORAL
  Filled 2019-04-28 (×3): qty 1

## 2019-04-28 MED ORDER — ASPIRIN EC 81 MG PO TBEC
81.0000 mg | DELAYED_RELEASE_TABLET | Freq: Every day | ORAL | Status: DC
  Administered 2019-04-28: 08:00:00 81 mg via ORAL
  Filled 2019-04-28: qty 1

## 2019-04-28 NOTE — Progress Notes (Signed)
ANTICOAGULATION CONSULT NOTE - Follow Up Consult  Pharmacy Consult for Heparin Indication: pulmonary embolus  No Known Allergies  Patient Measurements: Height: 5' (152.4 cm) Weight: 78.6 kg (173 lb 4.5 oz) IBW/kg (Calculated) : 45.5 Heparin Dosing Weight: 63.4 kg  Vital Signs: Temp: 97.9 F (36.6 C) (04/15 2007) Temp Source: Oral (04/15 2007) BP: 108/61 (04/15 2007) Pulse Rate: 66 (04/15 2007)  Labs: Recent Labs    04/27/19 1915 04/28/19 0900 04/28/19 1149 04/28/19 2114  HGB 10.8* 11.5*  --   --   HCT 33.6* 35.2*  --   --   PLT 246 298  --   --   APTT  --   --  171*  --   HEPARINUNFRC  --   --  1.12* 0.87*  CREATININE 1.12* 1.17*  --   --   TROPONINIHS  --  17  --   --     Estimated Creatinine Clearance: 46.8 mL/min (A) (by C-G formula based on SCr of 1.17 mg/dL (H)).  Assessment: 63 yoF IV heparin for BL LL PE's. Minimal clot burden. No RHS.. Ddimer >20. Baseline Hgb only 10.8 (chronic).  Heparin level remains elevated at 0.87, no oozing thus far on night shift per RN.  Goal of Therapy:  Heparin level 0.3-0.7 units/ml Monitor platelets by anticoagulation protocol: Yes   Plan:  Reduce heparin to 850 units/h Recheck heparin level in 8hr  Fredonia Highland, PharmD, BCPS Clinical Pharmacist [phone] Please check AMION for all [facility] Pharmacy numbers 04/28/2019

## 2019-04-28 NOTE — Progress Notes (Signed)
Right upper extremity venous duplex and bilateral lower extremity venous duplex has been completed. Preliminary results can be found in CV Proc through chart review.  Results were given to the patient's nurse, John.  04/28/19 2:02 PM Olen Cordial RVT

## 2019-04-28 NOTE — Progress Notes (Signed)
  Echocardiogram 2D Echocardiogram has been performed.  Diane Willis 04/28/2019, 11:13 AM

## 2019-04-28 NOTE — H&P (Signed)
History and Physical    Diane Willis HAL:[id number] DOB: 1955/05/14 DOA: 04/27/2019  PCP: Fleet Contras, MD  Patient coming from: Home.  Chief Complaint: Shortness of breath.  HPI: Diane Willis is a 64 y.o. female with history of diabetes mellitus type 2, hypertension presents to the ER because of worsening shortness of breath.  Patient states she had some subjective feeling of fever chills and upper respiratory tract infection and had gone up to the urgent care and where patient had a Covid test done about a week ago results of which are available in care everywhere.  Patient turned out to be positive for COVID-19 infection.  Since then patient has become progressively short of breath with chest tightness.  Presents to the ER because of worsening shortness of breath.  ED Course: In the ER patient is requiring 3 L oxygen.  CT angiogram of the chest is positive for bilateral pulmonary embolism with no strain pattern.  Also has signs of infection.  Patient was started on IV heparin remdesivir and Decadron admitted for further management of acute respiratory failure with hypoxia secondary to Covid infection and pulmonary embolism.  Patient is hemodynamically stable.  Labs show creatinine of 1.1 which is increased from baseline but the previous lab results she is 64 years old.  Patient's lactic acid was 2.3 improved to 1.4 after fluids.  Procalcitonin was 0.5 and D-dimer was more than 20.  CRP 19.2.  Hemoglobin was 10.  Review of Systems: As per HPI, rest all negative.   Past Medical History:  Diagnosis Date  . Diabetes mellitus without complication (HCC)   . Hypertension     Past Surgical History:  Procedure Laterality Date  . ABDOMINAL HYSTERECTOMY       reports that she has never smoked. She has never used smokeless tobacco. She reports that she does not drink alcohol or use drugs.  No Known Allergies  Family History  Problem Relation Age of Onset  . CAD Neg Hx   . Diabetes  Mellitus II Neg Hx     Prior to Admission medications   Medication Sig Start Date End Date Taking? Authorizing Provider  aspirin 81 MG tablet Take 81 mg by mouth daily.    [provider]  ciprofloxacin (CIPRO) 500 MG tablet Take 1 tablet (500 mg total) by mouth every 12 (twelve) hours. 11/07/14   Horton, Mayer Masker, MD  fluconazole (DIFLUCAN) 150 MG tablet Take 1 tablet (150 mg total) by mouth once. 11/19/14   Arby Barrette, MD  insulin aspart protamine- aspart (NOVOLOG MIX 70/30) (70-30) 100 UNIT/ML injection Inject into the skin.    [provider]  LISINOPRIL PO Take by mouth.    [provider]  nitrofurantoin, macrocrystal-monohydrate, (MACROBID) 100 MG capsule Take 1 capsule (100 mg total) by mouth 2 (two) times daily. 11/19/14   Arby Barrette, MD    Physical Exam: Constitutional: Moderately built and nourished. Vitals:   04/27/19 2300 04/27/19 2330 04/28/19 0111 04/28/19 0400  BP: (!) 147/76 137/70 140/75 137/70  Pulse: 86 87 90 87  Resp: 15 17 20 16   Temp:   98.3 F (36.8 C) 98.3 F (36.8 C)  TempSrc:   Oral Oral  SpO2: 92% 91% 91% 92%  Weight:   78.6 kg   Height:       Eyes: Anicteric no pallor. ENMT: No discharge from the ears eyes nose or mouth. Neck: No mass felt.  No neck rigidity. Respiratory: No rhonchi or crepitations. Cardiovascular:  S1-S2 heard. Abdomen: Soft nontender bowel sounds present. Musculoskeletal: No edema. Skin: No rash. Neurologic: Alert awake oriented to time place and person.  Moves all extremities. Psychiatric: Appears normal.  Normal affect.   Labs on Admission: I have personally reviewed following labs and imaging studies  CBC: Recent Labs  Lab 04/27/19 1915  WBC 6.2  NEUTROABS 5.1  HGB 10.8*  HCT 33.6*  MCV 96.8  PLT 246   Basic Metabolic Panel: Recent Labs  Lab 04/27/19 1915  NA 130*  K 4.3  CL 91*  CO2 25  GLUCOSE 152*  BUN 25*  CREATININE 1.12*  CALCIUM 8.7*   GFR: Estimated  Creatinine Clearance: 48.9 mL/min (A) (by C-G formula based on SCr of 1.12 mg/dL (H)). Liver Function Tests: Recent Labs  Lab 04/27/19 1915  AST 38  ALT 27  ALKPHOS 59  BILITOT 0.5  PROT 7.3  ALBUMIN 3.3*   No results for input(s): LIPASE, AMYLASE in the last 168 hours. No results for input(s): AMMONIA in the last 168 hours. Coagulation Profile: No results for input(s): INR, PROTIME in the last 168 hours. Cardiac Enzymes: No results for input(s): CKTOTAL, CKMB, CKMBINDEX, TROPONINI in the last 168 hours. BNP (last 3 results) No results for input(s): PROBNP in the last 8760 hours. HbA1C: No results for input(s): HGBA1C in the last 72 hours. CBG: Recent Labs  Lab 04/27/19 1854  GLUCAP 158*   Lipid Profile: Recent Labs    04/27/19 1915  TRIG 75   Thyroid Function Tests: No results for input(s): TSH, T4TOTAL, FREET4, T3FREE, THYROIDAB in the last 72 hours. Anemia Panel: Recent Labs    04/27/19 1834  FERRITIN 111   Urine analysis:    Component Value Date/Time   COLORURINE YELLOW 11/19/2014 1836   APPEARANCEUR CLEAR 11/19/2014 1836   LABSPEC 1.030 11/19/2014 1836   PHURINE 7.0 11/19/2014 1836   GLUCOSEU >1000 (A) 11/19/2014 1836   HGBUR NEGATIVE 11/19/2014 1836   BILIRUBINUR NEGATIVE 11/19/2014 1836   KETONESUR NEGATIVE 11/19/2014 1836   PROTEINUR NEGATIVE 11/19/2014 1836   UROBILINOGEN 0.2 11/19/2014 1836   NITRITE NEGATIVE 11/19/2014 1836   LEUKOCYTESUR SMALL (A) 11/19/2014 1836   Sepsis Labs: @LABRCNTIP (procalcitonin:4,lacticidven:4) ) Recent Results (from the past 240 hour(s))  Blood Culture (routine x 2)     Status: None (Preliminary result)   Collection Time: 04/27/19  7:15 PM   Specimen: BLOOD RIGHT ARM  Result Value Ref Range Status   Specimen Description   Final    BLOOD RIGHT ARM Performed at [facility], [address] [place], [place]    Special Requests   Final    BOTTLES DRAWN AEROBIC AND ANAEROBIC Blood Culture adequate  volume Performed at [facility], [address] [place], [place]    Culture PENDING  Incomplete   Report Status PENDING  Incomplete     Radiological Exams on Admission: CT Angio Chest PE W and/or Wo Contrast  Result Date: 04/27/2019 CLINICAL DATA:  COVID-19 diagnosis 04/21/2019, short of breath and fever, body aches EXAM: CT ANGIOGRAPHY CHEST WITH CONTRAST TECHNIQUE: Multidetector CT imaging of the chest was performed using the standard protocol during bolus administration of intravenous contrast. Multiplanar CT image reconstructions and MIPs were obtained to evaluate the vascular anatomy. CONTRAST:  06/21/2019 OMNIPAQUE IOHEXOL 350 MG/ML SOLN COMPARISON:  04/27/2019 FINDINGS: Cardiovascular: Evaluation is limited by respiratory motion. There is adequate opacification of the pulmonary vasculature. There are bilateral lower lobe segmental pulmonary emboli. Clot burden  is minimal. No evidence of right heart strain. No pericardial effusion. Normal caliber of the thoracic aorta. Moderate atherosclerosis of the aortic arch and coronary vessels. Mediastinum/Nodes: Subcentimeter mediastinal lymph nodes are likely reactive. Thyroid, trachea, and esophagus are unremarkable. Lungs/Pleura: Multifocal bilateral airspace disease, greatest at the lung bases, consistent with COVID-19 pneumonia. No effusion or pneumothorax. Central airways are patent. Upper Abdomen: No acute abnormality. Musculoskeletal: No acute or destructive bony lesions. Reconstructed images demonstrate no additional findings. Review of the MIP images confirms the above findings. IMPRESSION: 1. Bilateral lower lobe segmental pulmonary emboli. Minimal clot burden, with no evidence of right heart strain. 2. Multifocal bilateral airspace disease consistent with COVID-19 pneumonia. These results were called by telephone at the time of interpretation on 04/27/2019 at 9:42 pm to provider DAN FLOYD , who verbally acknowledged these results.  Electronically Signed   By: Randa Ngo M.D.   On: 04/27/2019 21:42   DG Chest Portable 1 View  Result Date: 04/27/2019 CLINICAL DATA:  COVID, shortness of breath EXAM: PORTABLE CHEST 1 VIEW COMPARISON:  None. FINDINGS: Patchy bilateral opacities primarily affecting mid to lower lung zones. No pleural effusion or pneumothorax. Cardiomediastinal contours are within normal limits for technique. There is calcified plaque along the aortic arch. IMPRESSION: Patchy bilateral opacities likely reflecting COVID-19 pneumonia. Electronically Signed   By: Macy Mis M.D.   On: 04/27/2019 19:37    EKG: Independently reviewed.  Sinus tachycardia.  Assessment/Plan Principal Problem:   Acute hypoxemic respiratory failure due to COVID-19 [facility]) Active Problems:   Bilateral pulmonary embolism (HCC)   Anemia   Hypertension   ARF (acute renal failure) (HCC)   Acute respiratory failure with hypoxia (HCC)    1. Acute hypoxic respiratory failure likely from pulmonary embolism COVID-19 pneumonia. 2. Acute Covid pneumonia for which patient is on remdesivir and Decadron.  Patient is on 3 L oxygen.  Closely monitor for any further worsening at that point patient may need Actemra.  Patient has consented. 3. Acute pulmonary embolism likely precipitated by Covid infection.  Patient has bilateral pulmonary embolism with no strain pattern.  Check 2D echo Dopplers.  On IV heparin for now.  If stable transition to oral anticoagulation. 4. History of hypertension on amlodipine and lisinopril.  Since patient's creatinine was increased and held lisinopril and continue amlodipine with as needed IV hydralazine. 5. Acute renal failure could be from dehydration.  Received fluids in the ER.  Check UA hold lisinopril repeat metabolic panel. 6. Diabetes mellitus type 2 on NovoLog 70/30 35 units in the morning and 25 in the night.  With sliding scale coverage for now.  Note the patient is on Decadron. 7. Anemia follow  CBC.  Since patient has acute respiratory failure with hypoxia secondary to Covid pneumonia and pulmonary embolism will need close monitoring for any further worsening in inpatient status.   DVT prophylaxis: Heparin. Code Status: Full code. Family Communication: Discussed with patient. Disposition Plan: Home. Consults called: None. Admission status: Inpatient.   Rise Patience MD [facility] Pager [phone].  If 7PM-7AM, please contact night-coverage [email or web address] Password [facility]  04/28/2019, 5:35 AM

## 2019-04-28 NOTE — Progress Notes (Signed)
ANTICOAGULATION CONSULT NOTE - Follow Up Consult  Pharmacy Consult for Heparin Indication: pulmonary embolus  No Known Allergies  Patient Measurements: Height: 5' (152.4 cm) Weight: 78.6 kg (173 lb 4.5 oz) IBW/kg (Calculated) : 45.5 Heparin Dosing Weight: 63.4 kg  Vital Signs: Temp: 98.6 F (37 C) (04/15 1204) Temp Source: Oral (04/15 1204) BP: 133/71 (04/15 1204) Pulse Rate: 92 (04/15 1204)  Labs: Recent Labs    04/27/19 1915 04/28/19 0900 04/28/19 1149  HGB 10.8* 11.5*  --   HCT 33.6* 35.2*  --   PLT 246 298  --   APTT  --   --  171*  HEPARINUNFRC  --   --  1.12*  CREATININE 1.12* 1.17*  --   TROPONINIHS  --  17  --     Estimated Creatinine Clearance: 46.8 mL/min (A) (by C-G formula based on SCr of 1.17 mg/dL (H)).  Assessment:   Anticoag: IV heparin for BL LL PE's. Minimal clot burden. No RHS.. Ddimer >20. Baseline Hgb only 10.8 (chronic).Heparin level 1.12 elevated on 20.5 units/kg/hr.   - 4/15 Dr. Jerral Ralph said pt ooze/bleed from her right arm-that required pressure for 10-15 min  and same things happened last night. Once no bleeding x24hrs, then can consider Lovenox.Right arm swelling: Likely secondary to IV infiltration-  Goal of Therapy:  Heparin level 0.3-0.7 units/ml Monitor platelets by anticoagulation protocol: Yes   Plan:  Hold IV heparin x , then resume at 1050 units/hr (17 units/kg/hr) Recheck heparin level 6 hrs after heparin resumed. Daily HL and CBC F/u to start oral anticoagulation   Ginamarie Banfield S. Merilynn Finland, PharmD, BCPS Clinical Staff Pharmacist [email or web address] Merilynn Finland, [place] Stillinger 04/28/2019,1:32 PM

## 2019-04-28 NOTE — Progress Notes (Signed)
PROGRESS NOTE                                                                                                                                                                                                             Patient Demographics:    Diane Willis, is a 64 y.o. female, DOB - 11-05-1955, ZOX:[id number]  Outpatient Primary MD for the patient is Fleet Contras, MD   Admit date - 04/27/2019   LOS - 1  Chief Complaint  Patient presents with   Fever    + Covid       Brief Narrative: Patient is a 64 y.o. female with PMHx of DM-2, HTN, HLD who presented with 1 week history of cough, worsening shortness of breath-further evaluation revealed acute hypoxemic respiratory failure secondary to COVID-19 pneumonia and pulmonary embolism.  Significant Events: 4/8>> COVID-19 positive at [facility] 4/14>> admit to [facility] for hypoxemia-COVID-19 pneumonia/bilateral PEs  COVID-19 medications: Steroids: 4/14>> Remdesivir: 4/14  Antibiotics: None  Microbiology data: 4/14>> blood culture: Negative  DVT prophylaxis: Heparin infusion  Procedures: None  Consults: None    Subjective:    Diane Willis today seems stable at rest-continues to have some cough.  Lying comfortably in bed.  No chest pain.   Assessment  & Plan :   Acute Hypoxic Resp Failure due to Covid 19 Viral pneumonia and bilateral pulmonary embolism: Stable on just 3 L of oxygen-continue steroids/remdesivir and IV heparin.  Pulmonary embolism likely provoked by COVID-19 infection.  Plans are to continue IV heparin.  Await echo, lower extremity Doppler, and right upper extremity Doppler (arm swollen-likely secondary to IV infiltration)  Note-if hypoxemia worsens-patient has consented to the off label use of Actemra.  She has no history of TB, hep B, or chronic diverticulitis.  Risks/benefits discussed in detail.  Fever: afebrile  O2 requirements:  SpO2: 93 % O2  Flow Rate (L/min): 3 L/min   COVID-19 Labs: Recent Labs    04/27/19 1834 04/27/19 1915 04/28/19 0900  DDIMER  --  >20.00* 11.06*  FERRITIN 111  --   --   LDH  --  456*  --   CRP 19.2*  --   --     No results found for: BNP  Recent Labs  Lab 04/27/19 1915  PROCALCITON 0.50    No results found for: SARSCOV2NAA   Prone/Incentive  Spirometry: encouraged  incentive spirometry use 3-4/hour.  Right arm swelling: Likely secondary to IV infiltration-was bleeding for quite a while this morning that resolved with compression.  Await Doppler studies.  HTN: BP stable-continue amlodipine.  Continue to hold lisinopril for now.  AKI: Likely hemodynamically mediated-very mild.  Continue to hold lisinopril.  DM-2: CBGs currently stable-continue insulin 70/30 35 units in a.m. and 25 units in p.m.  Will follow and adjust accordingly.  Recent Labs    04/27/19 1854 04/28/19 0739  GLUCAP 158* 182*   Obesity: Estimated body mass index is 33.84 kg/m as calculated from the following:   Height as of this encounter: 5' (1.524 m).   Weight as of this encounter: 78.6 kg.   ABG: No results found for: PHART, PCO2ART, PO2ART, HCO3, TCO2, ACIDBASEDEF, O2SAT  Vent Settings: N/A   Condition - Guarded  Family Communication  :  Spouse updated over the phone  Code Status :  Full Code  Diet :  Diet Order            Diet heart healthy/carb modified Room service appropriate? Yes; Fluid consistency: Thin  Diet effective now               Disposition Plan  :  Remain hospitalized  Barriers to discharge: Hypoxia requiring O2 supplementation/complete 5 days of IV Remdesivir  Antimicorbials  :    Anti-infectives (From admission, onward)   Start     Dose/Rate Route Frequency Ordered Stop   04/28/19 1000  remdesivir 100 mg in sodium chloride 0.9 % 100 mL IVPB     100 mg 200 mL/hr over 30 Minutes Intravenous Daily 04/27/19 2026 05/02/19 0959   04/27/19 2100  remdesivir 100 mg in sodium  chloride 0.9 % 100 mL IVPB     100 mg 200 mL/hr over 30 Minutes Intravenous  Once 04/27/19 2026 04/27/19 2219   04/27/19 2030  remdesivir 100 mg in sodium chloride 0.9 % 100 mL IVPB     100 mg 200 mL/hr over 30 Minutes Intravenous  Once 04/27/19 2026 04/27/19 2115      Inpatient Medications  Scheduled Meds:  amLODipine  5 mg Oral Daily   aspirin EC  81 mg Oral Daily   dexamethasone (DECADRON) injection  6 mg Intravenous Q24H   insulin aspart  0-9 Units Subcutaneous TID WC   insulin aspart protamine- aspart  25 Units Subcutaneous Q supper   insulin aspart protamine- aspart  35 Units Subcutaneous Q breakfast   Continuous Infusions:  heparin 1,300 Units/hr (04/27/19 2218)   remdesivir 100 mg in NS 100 mL 100 mg (04/28/19 0755)   PRN Meds:.hydrALAZINE, ondansetron **OR** ondansetron (ZOFRAN) IV   Time Spent in minutes  25  See all Orders from today for further details   Jeoffrey Massed M.D on 04/28/2019 at 10:57 AM  To page go to [email or web address] - use universal password  [facility] -  Office  [phone]    Objective:   Vitals:   04/27/19 2330 04/28/19 0111 04/28/19 0400 04/28/19 0740  BP: 137/70 140/75 137/70 132/68  Pulse: 87 90 87 85  Resp: 17 20 16 17   Temp:  98.3 F (36.8 C) 98.3 F (36.8 C) 98.8 F (37.1 C)  TempSrc:  Oral Oral Oral  SpO2: 91% 91% 92% 93%  Weight:  78.6 kg    Height:        Wt Readings from Last 3 Encounters:  04/28/19 78.6 kg  11/19/14 74.8 kg  11/06/14 77.1 kg  Intake/Output Summary (Last 24 hours) at 04/28/2019 1057 Last data filed at 04/28/2019 0900 Gross per 24 hour  Intake 560.25 ml  Output --  Net 560.25 ml     Physical Exam Gen Exam:Alert awake-not in any distress HEENT:atraumatic, normocephalic Chest: B/L clear to auscultation anteriorly CVS:S1S2 regular Abdomen:soft non tender, non distended Extremities:no edema Neurology: Non focal Skin: no rash   Data Review:    CBC Recent Labs  Lab  04/27/19 1915 04/28/19 0900  WBC 6.2 5.5  HGB 10.8* 11.5*  HCT 33.6* 35.2*  PLT 246 298  MCV 96.8 94.6  MCH 31.1 30.9  MCHC 32.1 32.7  RDW 12.6 12.3  LYMPHSABS 0.8 PENDING  MONOABS 0.2 PENDING  EOSABS 0.0 PENDING  BASOSABS 0.0 PENDING    Chemistries  Recent Labs  Lab 04/27/19 1915 04/28/19 0900  NA 130* 130*  K 4.3 4.2  CL 91* 91*  CO2 25 22  GLUCOSE 152* 185*  BUN 25* 23  CREATININE 1.12* 1.17*  CALCIUM 8.7* 8.7*  AST 38 33  ALT 27 27  ALKPHOS 59 66  BILITOT 0.5 0.8   ------------------------------------------------------------------------------------------------------------------ Recent Labs    04/27/19 1915  TRIG 75    No results found for: HGBA1C ------------------------------------------------------------------------------------------------------------------ No results for input(s): TSH, T4TOTAL, T3FREE, THYROIDAB in the last 72 hours.  Invalid input(s): FREET3 ------------------------------------------------------------------------------------------------------------------ Recent Labs    04/27/19 1834  FERRITIN 111    Coagulation profile No results for input(s): INR, PROTIME in the last 168 hours.  Recent Labs    04/27/19 1915 04/28/19 0900  DDIMER >20.00* 11.06*    Cardiac Enzymes No results for input(s): CKMB, TROPONINI, MYOGLOBIN in the last 168 hours.  Invalid input(s): CK ------------------------------------------------------------------------------------------------------------------ No results found for: BNP  Micro Results Recent Results (from the past 240 hour(s))  Blood Culture (routine x 2)     Status: None (Preliminary result)   Collection Time: 04/27/19  7:15 PM   Specimen: BLOOD RIGHT ARM  Result Value Ref Range Status   Specimen Description   Final    BLOOD RIGHT ARM Performed at [facility], [address], [place], [place] [address]    Special Requests   Final    BOTTLES DRAWN AEROBIC AND ANAEROBIC Blood  Culture adequate volume Performed at [facility], [address] [place], [place] [address]    Culture PENDING  Incomplete   Report Status PENDING  Incomplete    Radiology Reports CT Angio Chest PE W and/or Wo Contrast  Result Date: 04/27/2019 CLINICAL DATA:  COVID-19 diagnosis 04/21/2019, short of breath and fever, body aches EXAM: CT ANGIOGRAPHY CHEST WITH CONTRAST TECHNIQUE: Multidetector CT imaging of the chest was performed using the standard protocol during bolus administration of intravenous contrast. Multiplanar CT image reconstructions and MIPs were obtained to evaluate the vascular anatomy. CONTRAST:  OMNIPAQUE IOHEXOL 350 MG/ML SOLN COMPARISON:  04/27/2019 FINDINGS: Cardiovascular: Evaluation is limited by respiratory motion. There is adequate opacification of the pulmonary vasculature. There are bilateral lower lobe segmental pulmonary emboli. Clot burden is minimal. No evidence of right heart strain. No pericardial effusion. Normal caliber of the thoracic aorta. Moderate atherosclerosis of the aortic arch and coronary vessels. Mediastinum/Nodes: Subcentimeter mediastinal lymph nodes are likely reactive. Thyroid, trachea, and esophagus are unremarkable. Lungs/Pleura: Multifocal bilateral airspace disease, greatest at the lung bases, consistent with COVID-19 pneumonia. No effusion or pneumothorax. Central airways are patent. Upper Abdomen: No acute abnormality. Musculoskeletal: No acute or destructive bony lesions. Reconstructed images demonstrate no additional  findings. Review of the MIP images confirms the above findings. IMPRESSION: 1. Bilateral lower lobe segmental pulmonary emboli. Minimal clot burden, with no evidence of right heart strain. 2. Multifocal bilateral airspace disease consistent with COVID-19 pneumonia. These results were called by telephone at the time of interpretation on 04/27/2019 at 9:42 pm to provider DAN FLOYD , who verbally acknowledged these results.  Electronically Signed   By: Randa Ngo M.D.   On: 04/27/2019 21:42   DG Chest Portable 1 View  Result Date: 04/27/2019 CLINICAL DATA:  COVID, shortness of breath EXAM: PORTABLE CHEST 1 VIEW COMPARISON:  None. FINDINGS: Patchy bilateral opacities primarily affecting mid to lower lung zones. No pleural effusion or pneumothorax. Cardiomediastinal contours are within normal limits for technique. There is calcified plaque along the aortic arch. IMPRESSION: Patchy bilateral opacities likely reflecting COVID-19 pneumonia. Electronically Signed   By: Macy Mis M.D.   On: 04/27/2019 19:37

## 2019-04-29 ENCOUNTER — Inpatient Hospital Stay (HOSPITAL_COMMUNITY): Payer: BC Managed Care – PPO

## 2019-04-29 LAB — COMPREHENSIVE METABOLIC PANEL
ALT: 21 U/L (ref 0–44)
AST: 26 U/L (ref 15–41)
Albumin: 2.6 g/dL — ABNORMAL LOW (ref 3.5–5.0)
Alkaline Phosphatase: 46 U/L (ref 38–126)
Anion gap: 12 (ref 5–15)
BUN: 38 mg/dL — ABNORMAL HIGH (ref 8–23)
CO2: 25 mmol/L (ref 22–32)
Calcium: 8.5 mg/dL — ABNORMAL LOW (ref 8.9–10.3)
Chloride: 95 mmol/L — ABNORMAL LOW (ref 98–111)
Creatinine, Ser: 1.5 mg/dL — ABNORMAL HIGH (ref 0.44–1.00)
GFR calc Af Amer: 43 mL/min — ABNORMAL LOW (ref >60)
GFR calc non Af Amer: 37 mL/min — ABNORMAL LOW (ref >60)
Glucose, Bld: 135 mg/dL — ABNORMAL HIGH (ref 70–99)
Potassium: 4.2 mmol/L (ref 3.5–5.1)
Sodium: 132 mmol/L — ABNORMAL LOW (ref 135–145)
Total Bilirubin: 0.4 mg/dL (ref 0.3–1.2)
Total Protein: 6 g/dL — ABNORMAL LOW (ref 6.5–8.1)

## 2019-04-29 LAB — HEPARIN LEVEL (UNFRACTIONATED): Heparin Unfractionated: 0.64 IU/mL (ref 0.30–0.70)

## 2019-04-29 LAB — D-DIMER, QUANTITATIVE: D-Dimer, Quant: 6.11 ug/mL-FEU — ABNORMAL HIGH (ref 0.00–0.50)

## 2019-04-29 LAB — CBC
HCT: 26.3 % — ABNORMAL LOW (ref 36.0–46.0)
Hemoglobin: 8.7 g/dL — ABNORMAL LOW (ref 12.0–15.0)
MCH: 31.2 pg (ref 26.0–34.0)
MCHC: 33.1 g/dL (ref 30.0–36.0)
MCV: 94.3 fL (ref 80.0–100.0)
Platelets: 313 10*3/uL (ref 150–400)
RBC: 2.79 MIL/uL — ABNORMAL LOW (ref 3.87–5.11)
RDW: 12.3 % (ref 11.5–15.5)
WBC: 9.5 10*3/uL (ref 4.0–10.5)
nRBC: 0.9 % — ABNORMAL HIGH (ref 0.0–0.2)

## 2019-04-29 LAB — TROPONIN I (HIGH SENSITIVITY)
Troponin I (High Sensitivity): 7 ng/L (ref <18)
Troponin I (High Sensitivity): 6 ng/L (ref <18)

## 2019-04-29 LAB — FERRITIN: Ferritin: 134 ng/mL (ref 11–307)

## 2019-04-29 LAB — GLUCOSE, CAPILLARY
Glucose-Capillary: 125 mg/dL — ABNORMAL HIGH (ref 70–99)
Glucose-Capillary: 151 mg/dL — ABNORMAL HIGH (ref 70–99)
Glucose-Capillary: 213 mg/dL — ABNORMAL HIGH (ref 70–99)
Glucose-Capillary: 169 mg/dL — ABNORMAL HIGH (ref 70–99)

## 2019-04-29 LAB — C-REACTIVE PROTEIN: CRP: 10.3 mg/dL — ABNORMAL HIGH (ref <1.0)

## 2019-04-29 IMAGING — DX DG CHEST 1V PORT SAME DAY
1 series · 1 of 1 positions shown · non-contrast
Comparison: [DATE].

CLINICAL DATA: COVID.  Shortness of breath.

EXAM:
PORTABLE CHEST 1 VIEW

[chest]
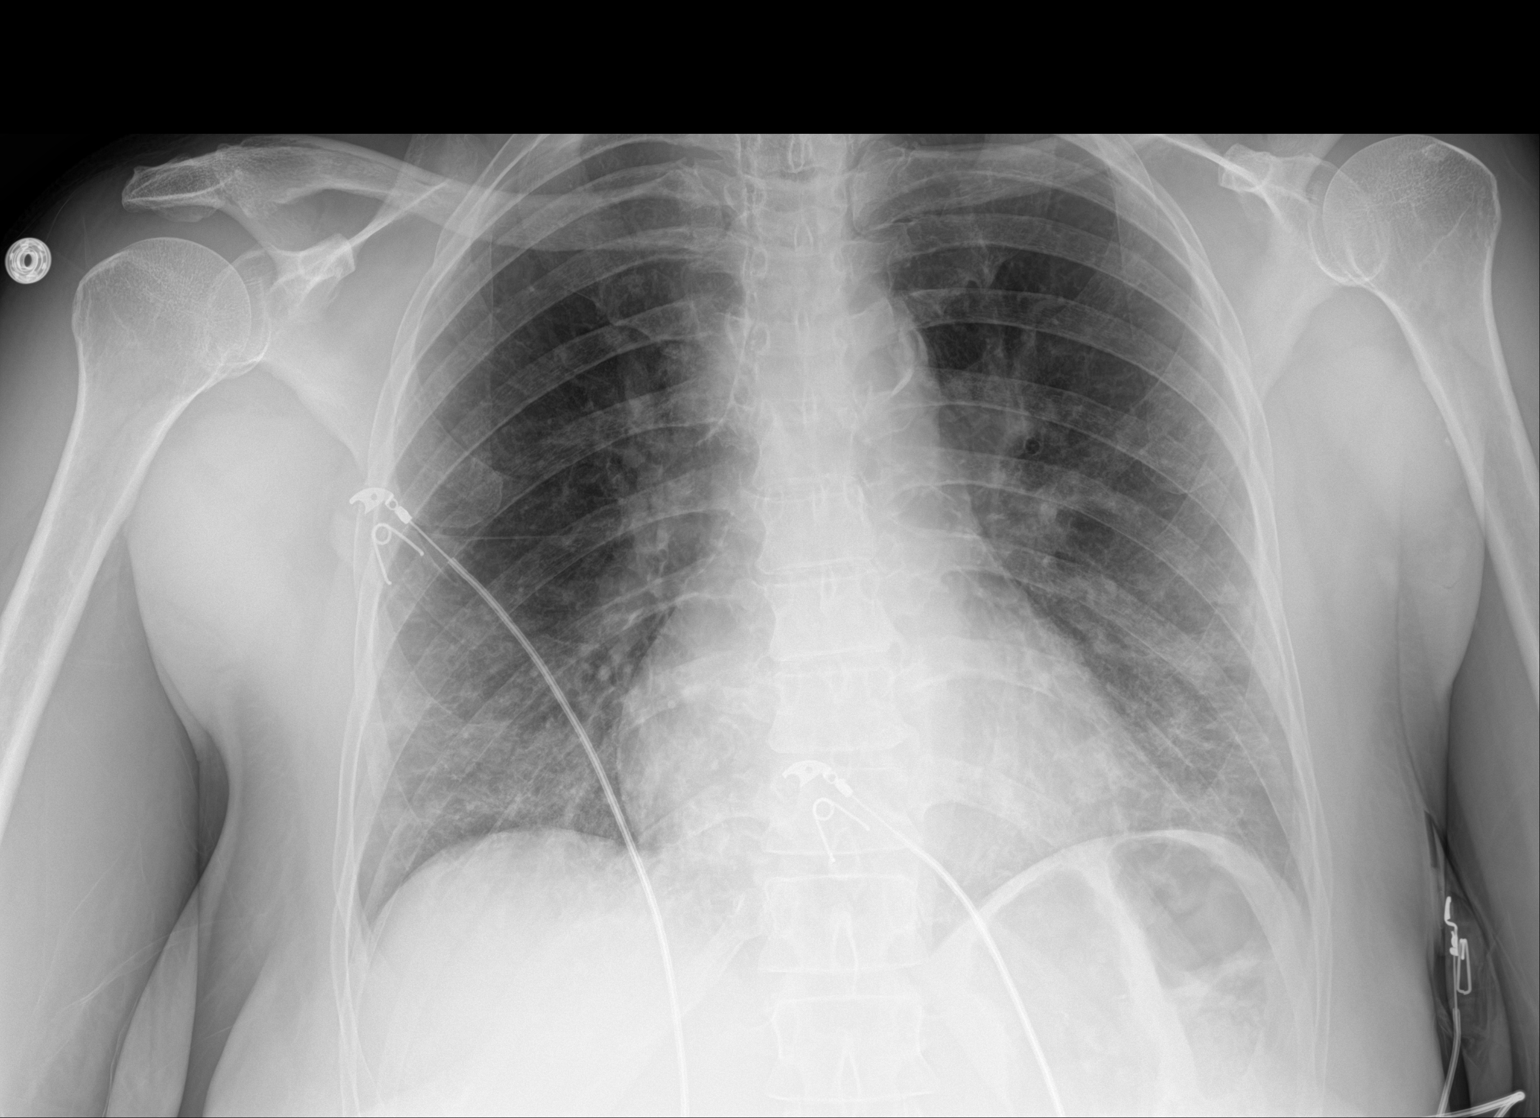

[1 of 1 positions shown; findings below may reference images not displayed]

FINDINGS: Heart size normal. Bilateral pulmonary infiltrates again noted.
Interim improvement from prior exam. No pleural effusion or
pneumothorax. Degenerative change thoracic spine.
IMPRESSION: Bilateral pulmonary infiltrates again noted. Interim improvement
from prior exam.

## 2019-04-29 MED ORDER — NITROGLYCERIN 0.4 MG SL SUBL: 0.4000 mg | SUBLINGUAL_TABLET | SUBLINGUAL | Status: DC | PRN

## 2019-04-29 MED ORDER — RIVAROXABAN (XARELTO) EDUCATION KIT FOR DVT/PE PATIENTS
PACK | Freq: Once | Status: AC
  Filled 2019-04-29: qty 1

## 2019-04-29 MED ORDER — RIVAROXABAN 15 MG PO TABS
15.0000 mg | ORAL_TABLET | Freq: Two times a day (BID) | ORAL | Status: DC
  Administered 2019-04-30: 15 mg via ORAL
  Filled 2019-04-29: qty 1

## 2019-04-29 MED ORDER — ALUM & MAG HYDROXIDE-SIMETH 200-200-20 MG/5ML PO SUSP: 30.0000 mL | Freq: Four times a day (QID) | ORAL | Status: DC | PRN

## 2019-04-29 MED ORDER — PANTOPRAZOLE SODIUM 40 MG PO TBEC
40.0000 mg | DELAYED_RELEASE_TABLET | Freq: Every day | ORAL | Status: DC
  Administered 2019-04-29: 12:00:00 40 mg via ORAL
  Filled 2019-04-29: qty 1

## 2019-04-29 MED ORDER — RIVAROXABAN 20 MG PO TABS: 20.0000 mg | ORAL_TABLET | Freq: Every day | ORAL | Status: DC

## 2019-04-29 MED ORDER — MORPHINE SULFATE (PF) 2 MG/ML IV SOLN: 1.0000 mg | INTRAVENOUS | Status: DC | PRN

## 2019-04-29 MED ORDER — RIVAROXABAN 15 MG PO TABS
15.0000 mg | ORAL_TABLET | Freq: Two times a day (BID) | ORAL | Status: AC
  Administered 2019-04-29 (×2): 15 mg via ORAL
  Filled 2019-04-29 (×2): qty 1

## 2019-04-29 NOTE — Progress Notes (Signed)
PROGRESS NOTE                                                                                                                                                                                                             Patient Demographics:    Diane Willis, is a 64 y.o. female, DOB - 08/13/1955, IDP:[id number]  Outpatient Primary MD for the patient is Fleet Contras, MD   Admit date - 04/27/2019   LOS - 2  Chief Complaint  Patient presents with  . Fever    + Covid       Brief Narrative: Patient is a 64 y.o. female with PMHx of DM-2, HTN, HLD who presented with 1 week history of cough, worsening shortness of breath-further evaluation revealed acute hypoxemic respiratory failure secondary to COVID-19 pneumonia and pulmonary embolism.  Significant Events: 4/8>> COVID-19 positive at [facility] 4/14>> admit to [facility] for hypoxemia-COVID-19 pneumonia/bilateral PEs  COVID-19 medications: Steroids: 4/14>> Remdesivir: 4/14  Antibiotics: None  Microbiology data: 4/14>> blood culture: Negative  DVT prophylaxis: Heparin infusion>>Xarelto  Procedures: None  Consults: None    Subjective:    Diane Willis feels better this morning-titrated to room air.  After I saw her-she had a brief.  Of atypical chest pain.   Assessment  & Plan :   Acute Hypoxic Resp Failure due to Covid 19 Viral pneumonia and bilateral pulmonary embolism: Improved-titrated to room air this morning-continue steroids/remdesivir-was on IV heparin-we will transition to Xarelto-as no bleeding from her right arm overnight.  Echo with preserved EF-no evidence of RV strain.  Bilateral lower extremity Doppler and right upper extremity Doppler negative for DVT.  CRP downtrending as well.  Follow.  Note-if hypoxemia worsens-patient has consented to the off label use of Actemra.  She has no history of TB, hep B, or chronic diverticulitis.  Risks/benefits discussed in  detail.  Fever: afebrile  O2 requirements:  SpO2: 94 % O2 Flow Rate (L/min): 1 L/min   COVID-19 Labs: Recent Labs    04/27/19 1834 04/27/19 1915 04/28/19 0900 04/29/19 0743  DDIMER  --  >20.00* 11.06* 6.11*  FERRITIN 111  --  105 134  LDH  --  456*  --   --   CRP 19.2*  --  21.9* 10.3*    No results found for: BNP  Recent Labs  Lab 04/27/19 1915 04/28/19  0900  PROCALCITON 0.50 0.71    No results found for: SARSCOV2NAA   Prone/Incentive Spirometry: encouraged  incentive spirometry use 3-4/hour.  Right arm swelling: Likely secondary to IV infiltration-no further bleeding from right arm.  Right upper extremity Doppler negative.  HTN: BP stable-continue amlodipine.  Continue to hold lisinopril for now.  AKI: Likely hemodynamically mediated-could have some mild contrast-induced nephropathy as well-creatinine slightly up-avoid nephrotoxic agents and follow.  DM-2: CBGs currently stable-continue insulin 70/30 35 units in a.m. and 25 units in p.m.  Will follow and adjust accordingly.  Recent Labs    04/28/19 2129 04/29/19 0740 04/29/19 1157  GLUCAP 123* 125* 151*   Obesity: Estimated body mass index is 33.84 kg/m as calculated from the following:   Height as of this encounter: 5' (1.524 m).   Weight as of this encounter: 78.6 kg.   ABG: No results found for: PHART, PCO2ART, PO2ART, HCO3, TCO2, ACIDBASEDEF, O2SAT  Vent Settings: N/A   Condition - Guarded  Family Communication  :  Spouse updated over the phone 4/16  Code Status :  Full Code  Diet :  Diet Order            Diet heart healthy/carb modified Room service appropriate? Yes; Fluid consistency: Thin  Diet effective now               Disposition Plan  :  Remain hospitalized  Barriers to discharge: Hypoxia requiring O2 supplementation/complete 5 days of IV Remdesivir  Antimicorbials  :    Anti-infectives (From admission, onward)   Start     Dose/Rate Route Frequency Ordered Stop    04/28/19 1000  remdesivir 100 mg in sodium chloride 0.9 % 100 mL IVPB     100 mg 200 mL/hr over 30 Minutes Intravenous Daily 04/27/19 2026 05/02/19 0959   04/27/19 2100  remdesivir 100 mg in sodium chloride 0.9 % 100 mL IVPB     100 mg 200 mL/hr over 30 Minutes Intravenous  Once 04/27/19 2026 04/27/19 2219   04/27/19 2030  remdesivir 100 mg in sodium chloride 0.9 % 100 mL IVPB     100 mg 200 mL/hr over 30 Minutes Intravenous  Once 04/27/19 2026 04/27/19 2115      Inpatient Medications  Scheduled Meds: . amLODipine  5 mg Oral Daily  . dexamethasone (DECADRON) injection  6 mg Intravenous Q24H  . insulin aspart  0-9 Units Subcutaneous TID WC  . insulin aspart protamine- aspart  25 Units Subcutaneous Q supper  . insulin aspart protamine- aspart  35 Units Subcutaneous Q breakfast  . pantoprazole  40 mg Oral Q1200   Continuous Infusions: . heparin 850 Units/hr (04/28/19 2156)  . remdesivir 100 mg in NS 100 mL 100 mg (04/29/19 0821)   PRN Meds:.alum & mag hydroxide-simeth, hydrALAZINE, morphine injection, nitroGLYCERIN, ondansetron **OR** ondansetron (ZOFRAN) IV   Time Spent in minutes  25  See all Orders from today for further details   Jeoffrey Massed M.D on 04/29/2019 at 12:29 PM  To page go to [email or web address] - use universal password  [facility] -  Office  [phone]    Objective:   Vitals:   04/28/19 1645 04/28/19 2007 04/29/19 0304 04/29/19 0831  BP: 114/61 108/61 (!) 148/68 122/66  Pulse: 84 66 90 81  Resp:   16 18  Temp: 98 F (36.7 C) 97.9 F (36.6 C) 98.1 F (36.7 C) 98.1 F (36.7 C)  TempSrc: Oral Oral Oral Oral  SpO2: 94% 98% 96% 94%  Weight:  Height:        Wt Readings from Last 3 Encounters:  04/28/19 78.6 kg  11/19/14 74.8 kg  11/06/14 77.1 kg     Intake/Output Summary (Last 24 hours) at 04/29/2019 1229 Last data filed at 04/29/2019 0850 Gross per 24 hour  Intake 1143.3 ml  Output --  Net 1143.3 ml     Physical Exam Gen  Exam:Alert awake-not in any distress HEENT:atraumatic, normocephalic Chest: B/L clear to auscultation anteriorly CVS:S1S2 regular Abdomen:soft non tender, non distended Extremities:no edema Neurology: Non focal Skin: no rash   Data Review:    CBC Recent Labs  Lab 04/27/19 1915 04/28/19 0900 04/29/19 0743  WBC 6.2 5.5 9.5  HGB 10.8* 11.5* 8.7*  HCT 33.6* 35.2* 26.3*  PLT 246 298 313  MCV 96.8 94.6 94.3  MCH 31.1 30.9 31.2  MCHC 32.1 32.7 33.1  RDW 12.6 12.3 12.3  LYMPHSABS 0.8 0.6*  --   MONOABS 0.2 0.1  --   EOSABS 0.0 0.0  --   BASOSABS 0.0 0.0  --     Chemistries  Recent Labs  Lab 04/27/19 1915 04/28/19 0900 04/29/19 0743  NA 130* 130* 132*  K 4.3 4.2 4.2  CL 91* 91* 95*  CO2 GLUCOSE 152* 185* 135*  BUN 25* 23 38*  CREATININE 1.12* 1.17* 1.50*  CALCIUM 8.7* 8.7* 8.5*  AST 38 33 26  ALT ALKPHOS 59 66 46  BILITOT 0.5 0.8 0.4   ------------------------------------------------------------------------------------------------------------------ Recent Labs    04/27/19 1915  TRIG 75    No results found for: HGBA1C ------------------------------------------------------------------------------------------------------------------ No results for input(s): TSH, T4TOTAL, T3FREE, THYROIDAB in the last 72 hours.  Invalid input(s): FREET3 ------------------------------------------------------------------------------------------------------------------ Recent Labs    04/28/19 0900 04/29/19 0743  FERRITIN 105 134    Coagulation profile No results for input(s): INR, PROTIME in the last 168 hours.  Recent Labs    04/28/19 0900 04/29/19 0743  DDIMER 11.06* 6.11*    Cardiac Enzymes No results for input(s): CKMB, TROPONINI, MYOGLOBIN in the last 168 hours.  Invalid input(s): CK ------------------------------------------------------------------------------------------------------------------ No results found for: BNP  Micro  Results Recent Results (from the past 240 hour(s))  Blood Culture (routine x 2)     Status: None (Preliminary result)   Collection Time: 04/27/19  7:15 PM   Specimen: BLOOD RIGHT ARM  Result Value Ref Range Status   Specimen Description   Final    BLOOD RIGHT ARM Performed at [facility], [address] [place], [place] [address]    Special Requests   Final    BOTTLES DRAWN AEROBIC AND ANAEROBIC Blood Culture adequate volume Performed at [facility], [address] [place], [place] [address]    Culture   Final    NO GROWTH < 24 HOURS Performed at [facility], [address], [place], [place] [address]    Report Status PENDING  Incomplete    Radiology Reports CT Angio Chest PE W and/or Wo Contrast  Result Date: 04/27/2019 CLINICAL DATA:  COVID-19 diagnosis 04/21/2019, short of breath and fever, body aches EXAM: CT ANGIOGRAPHY CHEST WITH CONTRAST TECHNIQUE: Multidetector CT imaging of the chest was performed using the standard protocol during bolus administration of intravenous contrast. Multiplanar CT image reconstructions and MIPs were obtained to evaluate the vascular anatomy. CONTRAST:  OMNIPAQUE IOHEXOL 350 MG/ML SOLN COMPARISON:  04/27/2019 FINDINGS: Cardiovascular: Evaluation is limited by respiratory motion. There is adequate opacification of the  pulmonary vasculature. There are bilateral lower lobe segmental pulmonary emboli. Clot burden is minimal. No evidence of right heart strain. No pericardial effusion. Normal caliber of the thoracic aorta. Moderate atherosclerosis of the aortic arch and coronary vessels. Mediastinum/Nodes: Subcentimeter mediastinal lymph nodes are likely reactive. Thyroid, trachea, and esophagus are unremarkable. Lungs/Pleura: Multifocal bilateral airspace disease, greatest at the lung bases, consistent with COVID-19 pneumonia. No effusion or pneumothorax. Central airways are patent. Upper Abdomen: No acute abnormality.  Musculoskeletal: No acute or destructive bony lesions. Reconstructed images demonstrate no additional findings. Review of the MIP images confirms the above findings. IMPRESSION: 1. Bilateral lower lobe segmental pulmonary emboli. Minimal clot burden, with no evidence of right heart strain. 2. Multifocal bilateral airspace disease consistent with COVID-19 pneumonia. These results were called by telephone at the time of interpretation on 04/27/2019 at 9:42 pm to provider DAN FLOYD , who verbally acknowledged these results. Electronically Signed   By: Randa Ngo M.D.   On: 04/27/2019 21:42   DG Chest Portable 1 View  Result Date: 04/27/2019 CLINICAL DATA:  COVID, shortness of breath EXAM: PORTABLE CHEST 1 VIEW COMPARISON:  None. FINDINGS: Patchy bilateral opacities primarily affecting mid to lower lung zones. No pleural effusion or pneumothorax. Cardiomediastinal contours are within normal limits for technique. There is calcified plaque along the aortic arch. IMPRESSION: Patchy bilateral opacities likely reflecting COVID-19 pneumonia. Electronically Signed   By: Macy Mis M.D.   On: 04/27/2019 19:37   ECHOCARDIOGRAM COMPLETE  Result Date: 04/28/2019    ECHOCARDIOGRAM REPORT   Patient Name:   SYLVIA HELMS Date of Exam: 04/28/2019 Medical Rec #:  [id number]       Height:       60.0 in Accession #:    [id number]      Weight:       173.3 lb Date of Birth:  07/31/1955      BSA:          1.756 m Patient Age:    63 years        BP:           132/68 mmHg Patient Gender: F               HR:           89 bpm. Exam Location:  Inpatient Procedure: 2D Echo, Cardiac Doppler and Color Doppler Indications:    I26.02 Pulmonary embolus  History:        Patient has no prior history of Echocardiogram examinations.                 Risk Factors:Hypertension and Diabetes. COVID-19 Positive.  Sonographer:    Jonelle Sidle Dance Referring Phys: [place]  1. Left ventricular ejection fraction, by  estimation, is 65 to 70%. The left ventricle has normal function. The left ventricle has no regional wall motion abnormalities. Left ventricular diastolic parameters are consistent with Grade I diastolic dysfunction (impaired relaxation). Elevated left atrial pressure.  2. Right ventricular systolic function is normal. The right ventricular size is normal. There is normal pulmonary artery systolic pressure.  3. The mitral valve is normal in structure. Mild mitral valve regurgitation. No evidence of mitral stenosis.  4. The aortic valve is normal in structure. Aortic valve regurgitation is not visualized. No aortic stenosis is present.  5. The inferior vena cava is normal in size with greater than 50% respiratory variability, suggesting right atrial pressure of 3 mmHg. FINDINGS  Left Ventricle: Left ventricular ejection  fraction, by estimation, is 65 to 70%. The left ventricle has normal function. The left ventricle has no regional wall motion abnormalities. The left ventricular internal cavity size was normal in size. There is  no left ventricular hypertrophy. Left ventricular diastolic parameters are consistent with Grade I diastolic dysfunction (impaired relaxation). Elevated left atrial pressure. Right Ventricle: The right ventricular size is normal. No increase in right ventricular wall thickness. Right ventricular systolic function is normal. There is normal pulmonary artery systolic pressure. Left Atrium: Left atrial size was normal in size. Right Atrium: Right atrial size was normal in size. Pericardium: There is no evidence of pericardial effusion. Mitral Valve: The mitral valve is normal in structure. Normal mobility of the mitral valve leaflets. Mild mitral valve regurgitation. No evidence of mitral valve stenosis. Tricuspid Valve: The tricuspid valve is normal in structure. Tricuspid valve regurgitation is trivial. No evidence of tricuspid stenosis. Aortic Valve: The aortic valve is normal in structure.  Aortic valve regurgitation is not visualized. No aortic stenosis is present. Pulmonic Valve: The pulmonic valve was normal in structure. Pulmonic valve regurgitation is mild. No evidence of pulmonic stenosis. Aorta: The aortic root is normal in size and structure. Venous: The inferior vena cava is normal in size with greater than 50% respiratory variability, suggesting right atrial pressure of 3 mmHg. IAS/Shunts: No atrial level shunt detected by color flow Doppler.  LEFT VENTRICLE PLAX 2D LVIDd:         4.10 cm  Diastology LVIDs:         2.56 cm  LV e' lateral:   6.20 cm/s LV PW:         1.29 cm  LV E/e' lateral: 17.6 LV IVS:        0.93 cm  LV e' medial:    8.38 cm/s LVOT diam:     1.90 cm  LV E/e' medial:  13.0 LV SV:         68 LV SV Index:   39 LVOT Area:     2.84 cm  RIGHT VENTRICLE             IVC RV Basal diam:  2.64 cm     IVC diam: 1.28 cm RV S prime:     18.30 cm/s TAPSE (M-mode): 2.0 cm LEFT ATRIUM             Index       RIGHT ATRIUM           Index LA diam:        3.70 cm 2.11 cm/m  RA Area:     16.00 cm LA Vol (A2C):   55.4 ml 31.54 ml/m RA Volume:   43.20 ml  24.60 ml/m LA Vol (A4C):   34.7 ml 19.76 ml/m LA Biplane Vol: 46.5 ml 26.48 ml/m  AORTIC VALVE LVOT Vmax:   120.00 cm/s LVOT Vmean:  72.300 cm/s LVOT VTI:    0.239 m  AORTA Ao Root diam: 3.10 cm Ao Asc diam:  3.40 cm MITRAL VALVE MV Area (PHT): 3.27 cm     SHUNTS MV Decel Time: 232 msec     Systemic VTI:  0.24 m MV E velocity: 109.00 cm/s  Systemic Diam: 1.90 cm MV A velocity: 119.00 cm/s MV E/A ratio:  0.92 Tobias Alexander MD Electronically signed by Tobias Alexander MD Signature Date/Time: 04/28/2019/9:08:57 PM    Final    VAS [place] LOWER EXTREMITY VENOUS (DVT)  Result Date: 04/28/2019  Lower Venous DVTStudy Indications: Pulmonary embolism.  Risk Factors: COVID 19 positive. Limitations: Body habitus, poor ultrasound/tissue interface and patient positioning. Comparison Study: No prior studies. Performing Technologist: Chanda Busing RVT   Examination Guidelines: A complete evaluation includes B-mode imaging, spectral Doppler, color Doppler, and power Doppler as needed of all accessible portions of each vessel. Bilateral testing is considered an integral part of a complete examination. Limited examinations for reoccurring indications may be performed as noted. The reflux portion of the exam is performed with the patient in reverse Trendelenburg.  +---------+---------------+---------+-----------+----------+--------------+ RIGHT    CompressibilityPhasicitySpontaneityPropertiesThrombus Aging +---------+---------------+---------+-----------+----------+--------------+ CFV      Full           Yes      Yes                                 +---------+---------------+---------+-----------+----------+--------------+ SFJ      Full                                                        +---------+---------------+---------+-----------+----------+--------------+ FV Prox  Full                                                        +---------+---------------+---------+-----------+----------+--------------+ FV Mid   Full                                                        +---------+---------------+---------+-----------+----------+--------------+ FV DistalFull                                                        +---------+---------------+---------+-----------+----------+--------------+ PFV      Full                                                        +---------+---------------+---------+-----------+----------+--------------+ POP      Full           Yes      Yes                                 +---------+---------------+---------+-----------+----------+--------------+ PTV      Full                                                        +---------+---------------+---------+-----------+----------+--------------+ PERO     Full                                                         +---------+---------------+---------+-----------+----------+--------------+   +---------+---------------+---------+-----------+----------+--------------+  LEFT     CompressibilityPhasicitySpontaneityPropertiesThrombus Aging +---------+---------------+---------+-----------+----------+--------------+ CFV      Full           Yes      Yes                                 +---------+---------------+---------+-----------+----------+--------------+ SFJ      Full                                                        +---------+---------------+---------+-----------+----------+--------------+ FV Prox  Full                                                        +---------+---------------+---------+-----------+----------+--------------+ FV Mid   Full                                                        +---------+---------------+---------+-----------+----------+--------------+ FV DistalFull                                                        +---------+---------------+---------+-----------+----------+--------------+ PFV      Full                                                        +---------+---------------+---------+-----------+----------+--------------+ POP      Full           Yes      Yes                                 +---------+---------------+---------+-----------+----------+--------------+ PTV      Full                                                        +---------+---------------+---------+-----------+----------+--------------+ PERO     Full                                                        +---------+---------------+---------+-----------+----------+--------------+     Summary: RIGHT: - There is no evidence of deep vein thrombosis in the lower extremity.  - No cystic structure found in the popliteal fossa.  LEFT: - There is no evidence of deep vein thrombosis in the lower extremity.  - No cystic  structure found in the popliteal fossa.   *See table(s) above for measurements and observations. Electronically signed by Lemar Livings MD on 04/28/2019 at 4:16:56 PM.    Final    VAS [place] UPPER EXTREMITY VENOUS DUPLEX  Result Date: 04/28/2019 UPPER VENOUS STUDY  Indications: Swelling Risk Factors: COVID 19 positive. Limitations: Bandages. Comparison Study: No prior studies. Performing Technologist: Chanda Busing RVT  Examination Guidelines: A complete evaluation includes B-mode imaging, spectral Doppler, color Doppler, and power Doppler as needed of all accessible portions of each vessel. Bilateral testing is considered an integral part of a complete examination. Limited examinations for reoccurring indications may be performed as noted.  Right Findings: +----------+------------+---------+-----------+----------+-------+ RIGHT     CompressiblePhasicitySpontaneousPropertiesSummary +----------+------------+---------+-----------+----------+-------+ IJV           Full       Yes       No                       +----------+------------+---------+-----------+----------+-------+ Subclavian    Full       Yes       No                       +----------+------------+---------+-----------+----------+-------+ Axillary      Full       Yes       No                       +----------+------------+---------+-----------+----------+-------+ Brachial      Full       Yes       No                       +----------+------------+---------+-----------+----------+-------+ Radial        Full                                          +----------+------------+---------+-----------+----------+-------+ Ulnar         Full                                          +----------+------------+---------+-----------+----------+-------+ Cephalic      Full                                          +----------+------------+---------+-----------+----------+-------+ Basilic       Full                                           +----------+------------+---------+-----------+----------+-------+  Left Findings: +----------+------------+---------+-----------+----------+-------+ LEFT      CompressiblePhasicitySpontaneousPropertiesSummary +----------+------------+---------+-----------+----------+-------+ Subclavian    Full       Yes       Yes                      +----------+------------+---------+-----------+----------+-------+  Summary:  Right: No evidence of deep vein thrombosis in the upper extremity. No evidence of superficial vein thrombosis in the upper extremity.  Left: No evidence of thrombosis in the subclavian.  *See table(s) above for measurements and observations.  Diagnosing physician: Lemar LivingsBrandon Cain MD Electronically signed by Lemar LivingsBrandon Cain MD on 04/28/2019 at 4:17:07 PM.    Final

## 2019-04-29 NOTE — Progress Notes (Signed)
Occupational Therapy Evaluation Patient Details Name: Diane Willis MRN: [id number] DOB: 25-Dec-1955 Today's Date: 04/29/2019    History of Present Illness 64 year old female admitted 04/27/19 with worsening SOB, subjective fever, chills, upper respiratory tract infrection. Patient went to urgent care a week ago, [facility], progressively SOB with chest tightness since. CT angiogram of the chest positive for bilateral pulmonary embolism with no strain pattern.  Also has signs of infection.  Patient was started on IV Heparin, Remdesivir and Decadron. Patient with acute respiratory failure with hypoxia secondary to COVID PNA and PE. PMH: DM2, HTN, HLD   Clinical Impression   PTA, pt lives with husband and was Independent with all ADLs, IADLs, and mobility without AD. Pt received on RA and motivated to participate, stats in low 90s at rest. Varied O2 readings during ambulation (see below), but suspect pt did not drop lower than 88% based on presentation. Pt Supervision for overall mobility and for LB ADLs with encouragement/education for energy conservation strategies to maximize performance. Pt overall Modified Independent for toileting task and hand hygiene standing at sink, no LOB noted. Pt would benefit from skilled OT services at acute level to further educate on energy conservation during ADL/IADL tasks and maximize cardiopulmonary tolerance. No OT needs anticipated at DC.     Follow Up Recommendations  No OT follow up;Supervision - Intermittent    Equipment Recommendations  None recommended by OT    Recommendations for Other Services       Precautions / Restrictions Precautions Precautions: Fall;Other (comment) Precaution Comments: +COVID Restrictions Weight Bearing Restrictions: No      Mobility Bed Mobility Overal bed mobility: Independent                Transfers Overall transfer level: Needs assistance Equipment used: None Transfers: Sit to/from  Stand;Stand Pivot Transfers Sit to Stand: Independent Stand pivot transfers: Supervision            Balance Overall balance assessment: Mild deficits observed, not formally tested                                         ADL either performed or assessed with clinical judgement   ADL Overall ADL's : Needs assistance/impaired Eating/Feeding: Independent;Sitting   Grooming: Standing;Wash/dry hands;Independent   Upper Body Bathing: Independent;Sitting   Lower Body Bathing: Supervison/ safety;Sit to/from stand;Sitting/lateral leans   Upper Body Dressing : Independent;Sitting;Standing   Lower Body Dressing: Supervision/safety;Sitting/lateral leans;Sit to/from stand   Toilet Transfer: Supervision/safety;Ambulation;Regular Toilet   Toileting- Clothing Manipulation and Hygiene: Modified independent;Sit to/from stand;Sitting/lateral lean       Functional mobility during ADLs: Supervision/safety General ADL Comments: Pt requires supervision at most to ensure safety with O2 stats and provide encouragement/education for energy conservation strategies. Pt cognitively intact and receptive to all education      Vision         Perception     Praxis      Pertinent Vitals/Pain Pain Assessment: No/denies pain     Hand Dominance     Extremity/Trunk Assessment Upper Extremity Assessment Upper Extremity Assessment: Overall WFL for tasks assessed           Communication Communication Communication: No difficulties   Cognition Arousal/Alertness: Awake/alert Behavior During Therapy: WFL for tasks assessed/performed Overall Cognitive Status: Within Functional Limits for tasks assessed  General Comments  Pt received on RA. O2 stats difficult to read during ambulation with ranges from 83-91%. Pt mildly SOB, but unsure if pt truly dropped below 88% on ambulation, as pt recovered to 90% within 5 seconds. With  changing of finger probe, pt stats at 92% at rest    Exercises     Shoulder Instructions      Home Living Family/patient expects to be discharged to:: Private residence Living Arrangements: Spouse/significant other   Type of Home: House Home Access: Level entry     Home Layout: One level     Bathroom Shower/Tub: Producer, television/film/video: Standard     Home Equipment: None   Additional Comments: Husband works.      Prior Functioning/Environment Level of Independence: Independent        Comments: She is retired, independent mobility, ADLs, IADLs        OT Problem List: Cardiopulmonary status limiting activity;Decreased activity tolerance      OT Treatment/Interventions: Self-care/ADL training;Therapeutic exercise;Energy conservation;Therapeutic activities;Patient/family education    OT Goals(Current goals can be found in the care plan section) Acute Rehab OT Goals Patient Stated Goal: be home with husband OT Goal Formulation: With patient Time For Goal Achievement: 05/13/19 Potential to Achieve Goals: Good ADL Goals Pt Will Perform Lower Body Bathing: Independently;sit to/from stand;sitting/lateral leans Pt Will Perform Lower Body Dressing: Independently;sitting/lateral leans;sit to/from stand Pt Will Transfer to Toilet: Independently;ambulating;regular height toilet Pt Will Perform Toileting - Clothing Manipulation and hygiene: Independently Additional ADL Goal #1: Pt to verbalize at least 3 energy conservation strategies to implement during daily tasks in order to maximize independence  OT Frequency: Min 2X/week   Barriers to D/C:            Co-evaluation              AM-PAC OT "6 Clicks" Daily Activity     Outcome Measure Help from another person eating meals?: None Help from another person taking care of personal grooming?: None Help from another person toileting, which includes using toliet, bedpan, or urinal?: A Little Help from  another person bathing (including washing, rinsing, drying)?: A Little Help from another person to put on and taking off regular upper body clothing?: None Help from another person to put on and taking off regular lower body clothing?: A Little 6 Click Score: 21   End of Session Equipment Utilized During Treatment: Gait belt  Activity Tolerance: Patient tolerated treatment well Patient left: in chair;with call bell/phone within reach  OT Visit Diagnosis: Other (comment)(decreased cardiopulmonary tolerance)                Time: 9622-2979 OT Time Calculation (min): 26 min Charges:  OT General Charges $OT Visit: 1 Visit OT Evaluation $OT Eval Moderate Complexity: 1 Mod OT Treatments $Self Care/Home Management : 8-22 mins  Lorre Munroe, OTR/L  Lorre Munroe 04/29/2019, 2:53 PM

## 2019-04-29 NOTE — Care Management (Signed)
Benefit check submitted for Xarelto 

## 2019-04-29 NOTE — TOC Benefit Eligibility Note (Signed)
Transition of Care [facility]) Benefit Eligibility Note    Patient Details  Name: Diane Willis MRN: [id number] Date of Birth: Jul 05, 1955   Medication/Dose: Alveda Reasons 10 MG DAILY        Prescription Coverage [facility]  Spoke with Person/Company/Phone Number:: TIFFANY  @ [facility] # [phone] OPT- MEMBER  Co-Pay: $47.00  Prior Approval: No  Deductible: Unmet(OUT-OF-POCKET: NOT MET)  Additional Notes: XARELTO 15 MG BID , CO-PAY- $47.00   and   XARELTO 20 MG DAILY , CO-PAY- $47.00    Memory Argue Phone Number: 04/29/2019, 4:58 PM

## 2019-04-29 NOTE — Progress Notes (Addendum)
Addendum:  Consult received to transition IV Heparin to Xarelto.  See objective data below. Will transition now - give first dose now then 2nd dose this PM. BID with meals starting tomorrow.   Plan: Start Xarelto 15mg  po BID x21 days then on 05/21/19 transition to 20mg  po daily with dinner.  Stop IV Heparin at the same time the first dose of Xarelto is given.  Monitor CBC, renal/liver function, and for any signs of bleeding.   07/21/19, PharmD, BCPS, BCCCP Clinical Pharmacist Please refer to [facility] for [facility] Pharmacy numbers 04/29/2019, 12:46 PM    ANTICOAGULATION CONSULT NOTE - Follow Up Consult  Pharmacy Consult for Heparin Indication: pulmonary embolus  No Known Allergies  Patient Measurements: Height: 5' (152.4 cm) Weight: 78.6 kg (173 lb 4.5 oz) IBW/kg (Calculated) : 45.5 Heparin Dosing Weight: 63.4 kg  Vital Signs: Temp: 98.1 F (36.7 C) (04/16 0831) Temp Source: Oral (04/16 0831) BP: 122/66 (04/16 0831) Pulse Rate: 81 (04/16 0831)  Labs: Recent Labs    04/27/19 1915 04/27/19 1915 04/28/19 0900 04/28/19 1149 04/28/19 2114 04/29/19 0743  HGB 10.8*   < > 11.5*  --   --  8.7*  HCT 33.6*  --  35.2*  --   --  26.3*  PLT 246  --  298  --   --  313  APTT  --   --   --  171*  --   --   HEPARINUNFRC  --   --   --  1.12* 0.87* 0.64  CREATININE 1.12*  --  1.17*  --   --   --   TROPONINIHS  --   --  17  --   --   --    < > = values in this interval not displayed.    Estimated Creatinine Clearance: 46.8 mL/min (A) (by C-G formula based on SCr of 1.17 mg/dL (H)).  Assessment: 63 yoF IV heparin for BL LL PE's. Minimal clot burden. No RHS.. Ddimer >20. Baseline Hgb only 10.8 (chronic).  Heparin level is therapeutic at 0.64 after reduction to 850 units/hr.  Hemoglobin has fallen from 11.5 to 8.7. Note that patient had bleeding from right arm - took quite a while to stop per RN. Bleeding has now stopped.  Platelets are stable at 313.  D-dimer is now trending down at  6.11.  Possible plan to transition to oral therapy later today or tomorrow-- Of note, SCr is trending up with current SCr up to 1.50 so would recommend close monitoring for bleeding as no dose reduction recommended for PE dosing of oral agents and would still need higher dosing for initial treatment period. LFTs are within normal limits.   Goal of Therapy:  Heparin level 0.3-0.7 units/ml Monitor platelets by anticoagulation protocol: Yes   Plan:  Continue heparin to 850 units/hr. Recheck heparin level in 6 hours to confirm due to recent bleeding issues. Daily heparin level and CBC.  Monitor for ability to transition to oral therapy when appropriate -- if transitions today, will discontinue PM level.   2115, PharmD, BCPS, BCCCP Clinical Pharmacist Please refer to [place] for [facility] Pharmacy numbers 04/29/2019

## 2019-04-29 NOTE — Evaluation (Signed)
Physical Therapy Evaluation Patient Details Name: WADE SIGALA MRN: [id number] DOB: 1955-06-07 Today's Date: 04/29/2019   History of Present Illness  64 year old female admitted 04/27/19 with worsening SOB, subjective fever, chills, upper respiratory tract infrection. Patient went to urgent care a week ago, [facility], progressively SOB with chest tightness since. CT angiogram of the chest positive for bilateral pulmonary embolism with no strain pattern.  Also has signs of infection.  Patient was started on IV Heparin, Remdesivir and Decadron. Patient with acute respiratory failure with hypoxia secondary to COVID PNA and PE. PMH: DM2, HTN, HLD    Clinical Impression  Patient presents with mild unsteadiness and decreased activity tolerance. Recommend continued skilled PT services while patient is in the hospital setting, ambulate daily with nursing staff, and anticipate patient will be able to progress her mobility in order to discharge home with her husband.    Follow Up Recommendations No PT follow up;Supervision - Intermittent    Equipment Recommendations  None recommended by PT       Precautions / Restrictions Precautions Precautions: Fall;Other (comment) Precaution Comments: +COVID Restrictions Weight Bearing Restrictions: No      Mobility  Bed Mobility Overal bed mobility: Independent    Transfers Overall transfer level: Needs assistance Equipment used: None Transfers: Sit to/from Stand Sit to Stand: Supervision;Independent General transfer comment: toilet transfer with distance supervision, sit>stand from EOB x 2 trials supervision to independent  Ambulation/Gait Ambulation/Gait assistance: Supervision Gait Distance (Feet): 50 Feet(10x 3) Assistive device: None Gait Pattern/deviations: Step-through pattern;Decreased step length - right;Decreased step length - left Gait velocity: decreased   General Gait Details: Patient reports mild unsteadiness. No  overt LOB.     Balance Overall balance assessment: Mild deficits observed, not formally tested       Pertinent Vitals/Pain Pain Assessment: No/denies pain    Home Living Family/patient expects to be discharged to:: Private residence Living Arrangements: Spouse/significant other   Type of Home: House Home Access: Level entry     Home Layout: One level Home Equipment: None Additional Comments: Husband works.    Prior Function Level of Independence: Independent    Comments: She is retired, independent mobility, ADLs, IADLs     Extremity/TrunkAssessment        Lower Extremity Assessment Lower Extremity Assessment: Generalized weakness       Communication   Communication: No difficulties  Cognition Arousal/Alertness: Awake/alert Behavior During Therapy: WFL for tasks assessed/performed Overall Cognitive Status: Within Functional Limits for tasks assessed     General Comments General comments (skin integrity, edema, etc.): Patient on room air. O2 saturation 85% after ambulating to and from restroom. HR in 90s bpm up to 128 bpm with mobility. O2 sat ranging from 84-93% with ambulation in hallway but not sustained when low. Question if patient is desatting to 87% during mobility but appears to recover quickly.    Exercises Other Exercises Other Exercises: incentive spirometer x 5, max 722mL   Assessment/Plan    PT Assessment Patient needs continued PT services  PT Problem List Decreased strength;Decreased activity tolerance;Decreased balance;Decreased mobility;Cardiopulmonary status limiting activity       PT Treatment Interventions DME instruction;Gait training;Functional mobility training;Therapeutic activities;Therapeutic exercise;Balance training;Patient/family education    PT Goals (Current goals can be found in the Care Plan section)  Acute Rehab PT Goals Time For Goal Achievement: 05/12/19 Potential to Achieve Goals: Good    Frequency Min 3X/week    Barriers to discharge   Husband works  AM-PAC PT "6 Clicks" Mobility  Outcome Measure Help needed turning from your back to your side while in a flat bed without using bedrails?: None Help needed moving from lying on your back to sitting on the side of a flat bed without using bedrails?: None Help needed moving to and from a bed to a chair (including a wheelchair)?: A Little Help needed standing up from a chair using your arms (e.g., wheelchair or bedside chair)?: None Help needed to walk in hospital room?: A Little Help needed climbing 3-5 steps with a railing? : A Little 6 Click Score: 21    End of Session   Activity Tolerance: Patient limited by fatigue Patient left: in chair;with call bell/phone within reach Nurse Communication: Mobility status;Other (comment)(O2 saturation response) PT Visit Diagnosis: Unsteadiness on feet (R26.81);Other abnormalities of gait and mobility (R26.89)    Time: 1916-6060 PT Time Calculation (min) (ACUTE ONLY): 38 min   Charges:   PT Evaluation $PT Eval Moderate Complexity: 1 Mod         Angelene Giovanni, PT, DPT Acute Rehab [phone] office   Angelene Giovanni 04/29/2019, 9:44 AM

## 2019-04-30 DIAGNOSIS — J1282 Pneumonia due to coronavirus disease 2019: Secondary | ICD-10-CM

## 2019-04-30 LAB — COMPREHENSIVE METABOLIC PANEL
ALT: 23 U/L (ref 0–44)
AST: 32 U/L (ref 15–41)
Albumin: 2.9 g/dL — ABNORMAL LOW (ref 3.5–5.0)
Alkaline Phosphatase: 48 U/L (ref 38–126)
Anion gap: 11 (ref 5–15)
BUN: 41 mg/dL — ABNORMAL HIGH (ref 8–23)
CO2: 26 mmol/L (ref 22–32)
Calcium: 8.8 mg/dL — ABNORMAL LOW (ref 8.9–10.3)
Chloride: 99 mmol/L (ref 98–111)
Creatinine, Ser: 1.44 mg/dL — ABNORMAL HIGH (ref 0.44–1.00)
GFR calc Af Amer: 45 mL/min — ABNORMAL LOW (ref >60)
GFR calc non Af Amer: 39 mL/min — ABNORMAL LOW (ref >60)
Glucose, Bld: 55 mg/dL — ABNORMAL LOW (ref 70–99)
Potassium: 3.7 mmol/L (ref 3.5–5.1)
Sodium: 136 mmol/L (ref 135–145)
Total Bilirubin: 0.9 mg/dL (ref 0.3–1.2)
Total Protein: 6.2 g/dL — ABNORMAL LOW (ref 6.5–8.1)

## 2019-04-30 LAB — CBC
HCT: 27.6 % — ABNORMAL LOW (ref 36.0–46.0)
Hemoglobin: 9 g/dL — ABNORMAL LOW (ref 12.0–15.0)
MCH: 31.3 pg (ref 26.0–34.0)
MCHC: 32.6 g/dL (ref 30.0–36.0)
MCV: 95.8 fL (ref 80.0–100.0)
Platelets: 332 10*3/uL (ref 150–400)
RBC: 2.88 MIL/uL — ABNORMAL LOW (ref 3.87–5.11)
RDW: 12.7 % (ref 11.5–15.5)
WBC: 8.9 10*3/uL (ref 4.0–10.5)
nRBC: 2.6 % — ABNORMAL HIGH (ref 0.0–0.2)

## 2019-04-30 LAB — C-REACTIVE PROTEIN: CRP: 6.8 mg/dL — ABNORMAL HIGH (ref <1.0)

## 2019-04-30 LAB — D-DIMER, QUANTITATIVE: D-Dimer, Quant: 5.3 ug/mL-FEU — ABNORMAL HIGH (ref 0.00–0.50)

## 2019-04-30 LAB — GLUCOSE, CAPILLARY
Glucose-Capillary: 42 mg/dL — CL (ref 70–99)
Glucose-Capillary: 57 mg/dL — ABNORMAL LOW (ref 70–99)
Glucose-Capillary: 132 mg/dL — ABNORMAL HIGH (ref 70–99)
Glucose-Capillary: 337 mg/dL — ABNORMAL HIGH (ref 70–99)

## 2019-04-30 LAB — MRSA PCR SCREENING: MRSA by PCR: NEGATIVE

## 2019-04-30 LAB — FERRITIN: Ferritin: 95 ng/mL (ref 11–307)

## 2019-04-30 MED ORDER — INSULIN ASPART PROT & ASPART (70-30 MIX) 100 UNIT/ML ~~LOC~~ SUSP: 18.0000 [IU] | SUBCUTANEOUS | 11 refills | Status: DC

## 2019-04-30 MED ORDER — ALPRAZOLAM 0.25 MG PO TABS: 0.2500 mg | ORAL_TABLET | Freq: Three times a day (TID) | ORAL | 0 refills | Status: AC | PRN

## 2019-04-30 MED ORDER — PANTOPRAZOLE SODIUM 40 MG PO TBEC: 40.0000 mg | DELAYED_RELEASE_TABLET | Freq: Every day | ORAL | 0 refills | Status: DC

## 2019-04-30 MED ORDER — ALPRAZOLAM 0.25 MG PO TABS: 0.2500 mg | ORAL_TABLET | Freq: Three times a day (TID) | ORAL | 0 refills | Status: DC | PRN

## 2019-04-30 MED ORDER — DEXAMETHASONE 6 MG PO TABS: 6.0000 mg | ORAL_TABLET | Freq: Every day | ORAL | 0 refills | Status: DC

## 2019-04-30 MED ORDER — RIVAROXABAN 20 MG PO TABS: 20.0000 mg | ORAL_TABLET | Freq: Every day | ORAL | 0 refills | Status: DC

## 2019-04-30 MED ORDER — RIVAROXABAN 15 MG PO TABS: 15.0000 mg | ORAL_TABLET | Freq: Two times a day (BID) | ORAL | 0 refills | Status: DC

## 2019-04-30 MED ORDER — ALPRAZOLAM 0.25 MG PO TABS
0.2500 mg | ORAL_TABLET | Freq: Three times a day (TID) | ORAL | Status: DC | PRN
  Administered 2019-04-30: 08:00:00 0.25 mg via ORAL
  Filled 2019-04-30: qty 1

## 2019-04-30 MED ORDER — METFORMIN HCL 500 MG PO TABS: 500.0000 mg | ORAL_TABLET | Freq: Two times a day (BID) | ORAL | Status: DC

## 2019-04-30 NOTE — Progress Notes (Signed)
   04/30/19 0524  Assess: MEWS Score  BP 137/70  Pulse Rate (!) 103  ECG Heart Rate (!) 104  Resp 17  SpO2 93 %  O2 Device Room Air  Patient Activity (if Appropriate) In bed (Sitting up in bed.)  Assess: MEWS Score  MEWS Temp 0  MEWS Systolic 0  MEWS Pulse 1  MEWS RR 0  MEWS LOC 0  MEWS Score 1  MEWS Score Color Green   No response from NP. Patient states that she feels slightly better. Patient resting in bed with HOB in highest position. O2 1L Foley applied for patient comfort. This RN stayed with patient for over 5 minutes per patient request. This RN repeatedly encouraged calming techniques. Will continue to monitor.

## 2019-04-30 NOTE — Progress Notes (Addendum)
   04/30/19 0516  Assess: MEWS Score  BP (!) 154/73  Pulse Rate (!) 120  ECG Heart Rate (!) 120  Resp 15  SpO2 93 %  O2 Device Room Air  Patient Activity (if Appropriate) In bed (Sitting up in bed.)  Assess: MEWS Score  MEWS Temp 0  MEWS Systolic 0  MEWS Pulse 2  MEWS RR 0  MEWS LOC 0  MEWS Score 2  MEWS Score Color Yellow  Assess: if the MEWS score is Yellow or Red  Were vital signs taken at a resting state? No (Patient anxious, restless.)  Focused Assessment Documented focused assessment  Early Detection of Sepsis Score *See Row Information* Low  MEWS guidelines implemented *See Row Information* No, vital signs rechecked  Notify: Provider  Provider Name/Title Bruna Potter, NP  Date Provider Notified 04/30/19  Time Provider Notified [phone]  Notification Type Page  Notification Reason Other (Comment) (Patient anxious, restless. Tachycardia. )  Response No new orders  Document  Patient Outcome Other (Comment) (Patient eventually calmed, and VS returned to normal.)  Progress note created (see row info) Yes   Patient states that she was sleeping and suddenly woke up, with SOB and chest tightness. Patient c/o feeling very anxious. This RN notes patient sitting on EOB restless. This RN encourages calming techniques. NP paged.  Will continue to monitor.

## 2019-04-30 NOTE — Discharge Instructions (Addendum)
You are scheduled for an outpatient infusion of Remdesivir at  8:30 AM on Sunday 4/18  Please report to [facility] at [address].  Drive to the security guard and tell them you are here for an infusion. They will direct you to the front entrance where we will come and get you.  For questions call [phone].  Thanks             Information on my medicine - XARELTO (rivaroxaban)  This medication education was reviewed with me or my healthcare representative as part of my discharge preparation.  The pharmacist that spoke with me during my hospital stay was:  Fayne Norrie, [facility]  WHY WAS Carlena Hurl PRESCRIBED FOR YOU? Xarelto was prescribed to treat blood clots that may have been found in the veins of your legs (deep vein thrombosis) or in your lungs (pulmonary embolism) and to reduce the risk of them occurring again.  What do you need to know about Xarelto? The starting dose is one 15 mg tablet taken TWICE daily with food for the FIRST 21 DAYS then on Saturday 05/21/19  the dose is changed to one 20 mg tablet taken ONCE A DAY with your evening meal.  DO NOT stop taking Xarelto without talking to the health care provider who prescribed the medication.  Refill your prescription for 20 mg tablets before you run out.  After discharge, you should have regular check-up appointments with your healthcare provider that is prescribing your Xarelto.  In the future your dose may need to be changed if your kidney function changes by a significant amount.  What do you do if you miss a dose? If you are taking Xarelto TWICE DAILY and you miss a dose, take it as soon as you remember. You may take two 15 mg tablets (total 30 mg) at the same time then resume your regularly scheduled 15 mg twice daily the next day.  If you are taking Xarelto ONCE DAILY and you miss a dose, take it as soon as you remember on the same day then continue your regularly scheduled once daily regimen the  next day. Do not take two doses of Xarelto at the same time.   Important Safety Information Xarelto is a blood thinner medicine that can cause bleeding. You should call your healthcare provider right away if you experience any of the following: ? Bleeding from an injury or your nose that does not stop. ? Unusual colored urine (red or dark brown) or unusual colored stools (red or black). ? Unusual bruising for unknown reasons. ? A serious fall or if you hit your head (even if there is no bleeding).  Some medicines may interact with Xarelto and might increase your risk of bleeding while on Xarelto. To help avoid this, consult your healthcare provider or pharmacist prior to using any new prescription or non-prescription medications, including herbals, vitamins, non-steroidal anti-inflammatory drugs (NSAIDs) and supplements.  This website has more information on Xarelto: VisitDestination.com.br.

## 2019-04-30 NOTE — Progress Notes (Signed)
Patient scheduled for outpatient Remdesivir infusion at 8:30 AM on Sunday 4/18.   Please advise them to report to [facility] at [address].  Drive to the security guard and tell them you are here for an infusion. They will direct you to the front entrance where we will come and get you.  For questions call [phone].  Thanks

## 2019-04-30 NOTE — Progress Notes (Signed)
   04/29/19 1938  Assess: MEWS Score  Pulse Rate (!) 113  ECG Heart Rate (!) 113  Resp (!) 29  SpO2 90 %  O2 Device Room Air  Patient Activity (if Appropriate) Ambulating (Back to room after ambulating in hallway.)  Assess: MEWS Score  MEWS Temp 0  MEWS Systolic 0  MEWS Pulse 2  MEWS RR 2  MEWS LOC 0  MEWS Score 4  MEWS Score Color Red  Assess: if the MEWS score is Yellow or Red  Were vital signs taken at a resting state? No (Patient just finished ambulating in hall.)  Early Detection of Sepsis Score *See Row Information* Low  MEWS guidelines implemented *See Row Information* No, vital signs rechecked   Patient ambulated with standby assist x3 laps around Covid Unit. Patient tolerated fairly well. SpO2 was not monitored while ambulating, but patient ambulated on RA. VS above were after patient returned to room and monitor re-applied. Patient states chest felt slightly tight with mild SOB while ambulating. Will continue to monitor.

## 2019-04-30 NOTE — Care Management (Signed)
Provided nurse with 30 day and copay reduction cards for xaralto.  Spoke to patient over the phone and explained cards. Also reminded her of appointment tomorrow at remdesivir clinic.

## 2019-04-30 NOTE — Discharge Summary (Addendum)
PATIENT DETAILS Name: Diane Willis Age: 64 y.o. Sex: female Date of Birth: 1955/08/19 MRN: [id number]. Admitting Physician: Hillary Bow, DO YQM:[id number], Dorma Russell, MD  Admit Date: 04/27/2019 Discharge date: 04/30/2019  Recommendations for Outpatient Follow-up:  1. Follow up with PCP in 1-2 weeks 2. Please obtain CMP/CBC in one week 3. Repeat Chest Xray in 4-6 week 4. Will be on anticoagulation for PE likely provoked from COVID-19-please consider outpatient referral to hematology before discontinuing anticoagulation. 5. Lisinopril remains on hold-please reassess at next visit whether this can be resumed.  Admitted From:  Home   Disposition: Home   Home Health: No  Equipment/Devices: None  Discharge Condition: Stable  CODE STATUS: FULL CODE  Diet recommendation:  Diet Order            Diet - low sodium heart healthy        Diet Carb Modified        Diet heart healthy/carb modified Room service appropriate? Yes; Fluid consistency: Thin  Diet effective now              Brief Narrative: Patient is a 64 y.o. female with PMHx of DM-2, HTN, HLD who presented with 1 week history of cough, worsening shortness of breath-further evaluation revealed acute hypoxemic respiratory failure secondary to COVID-19 pneumonia and pulmonary embolism.  Significant Events: 4/8>> COVID-19 [facility] at [facility] 4/14>> admit to [facility] for hypoxemia-COVID-19 pneumonia/bilateral PEs  COVID-19 medications: Steroids: 4/14>> Remdesivir: 4/14>>4/18  Antibiotics: None  Microbiology data: 4/14>> blood culture: Negative  Procedures: None  Consults: None  Brief Hospital Course: Acute Hypoxic Resp Failure due to Covid 19 Viral pneumonia and bilateral pulmonary embolism: Improved-titrated to room air yesterday-remains on room air.  Per nursing staff-does not require home O2-saturation in the 90s with ambulation.  Initially treated with IV heparin but has been transitioned to  Xarelto.  In regards to COVID-19 pneumonia-treated with steroids and remdesivir-since clinically improved-on room air-patient will complete last dose of remdesivir in the [facility] infusion center on 4/18 as an outpatient.  Patient will continue to be on steroids for a few more days.  Echo with preserved EF-no evidence of RV strain.  Bilateral lower extremity Doppler and right upper extremity Doppler negative for DVT.  CRP downtrending as well.  Follow  COVID-19 Labs:  Recent Labs    04/27/19 1834 04/27/19 1915 04/28/19 0900 04/29/19 0743 04/30/19 0724  DDIMER   < > >20.00* 11.06* 6.11* 5.30*  FERRITIN   < >  --  105 134 95  LDH  --  456*  --   --   --   CRP   < >  --  21.9* 10.3* 6.8*   < > = values in this interval not displayed.    No results found for: SARSCOV2NAA   Right arm swelling: Likely secondary to IV infiltration-no further bleeding from right arm.  Right upper extremity Doppler negative.  Swelling has significantly improved.  Chest pain: Has had atypical chest pain during this hospital stay-patient does acknowledge some anxiety-high suspicion that this is related to anxiety.  CTA chest with atherosclerosis.  Echo without any wall motion abnormalities-troponins negative.  EKG unremarkable.  Spoke with cardiology on-call-Dr. Verl Dicker office will arrange for outpatient follow-up.  HTN: BP stable-continue amlodipine.  Continue to hold lisinopril/HCTZ for now.  AKI: Likely hemodynamically mediated-could have some mild contrast-induced nephropathy as well-creatinine improving and downtrending-PCP to follow in 1 week..  DM-2: CBGs stable-did have some hypoglycemia this morning-decrease p.m. dosage of  insulin 70/30 to 18 units, continue 35 units in a.m.  Follow with PCP for further optimization.  Obesity: Estimated body mass index is 33.84 kg/m as calculated from the following:   Height as of this encounter: 5' (1.524 m).   Weight as of this encounter: 78.6 kg.     Discharge Diagnoses:  Principal Problem:   Acute hypoxemic respiratory failure due to COVID-19 [facility]) Active Problems:   Bilateral pulmonary embolism (HCC)   Anemia   Hypertension   ARF (acute renal failure) (HCC)   Acute respiratory failure with hypoxia [facility])   Discharge Instructions:    Person Under Monitoring Name: Diane Willis  Location: [address] [place] [address]   Infection Prevention Recommendations for Individuals Confirmed to have, or Being Evaluated for, 2019 Novel Coronavirus (COVID-19) Infection Who Receive Care at Home  Individuals who are confirmed to have, or are being evaluated for, COVID-19 should follow the prevention steps below until a healthcare provider or local or state health department says they can return to normal activities.  Stay home except to get medical care You should restrict activities outside your home, except for getting medical care. Do not go to work, school, or public areas, and do not use public transportation or taxis.  Call ahead before visiting your doctor Before your medical appointment, call the healthcare provider and tell them that you have, or are being evaluated for, COVID-19 infection. This will help the healthcare provider's office take steps to keep other people from getting infected. Ask your healthcare provider to call the local or state health department.  Monitor your symptoms Seek prompt medical attention if your illness is worsening (e.g., difficulty breathing). Before going to your medical appointment, call the healthcare provider and tell them that you have, or are being evaluated for, COVID-19 infection. Ask your healthcare provider to call the local or state health department.  Wear a facemask You should wear a facemask that covers your nose and mouth when you are in the same room with other people and when you visit a healthcare provider. People who live with or visit you should also wear a  facemask while they are in the same room with you.  Separate yourself from other people in your home As much as possible, you should stay in a different room from other people in your home. Also, you should use a separate bathroom, if available.  Avoid sharing household items You should not share dishes, drinking glasses, cups, eating utensils, towels, bedding, or other items with other people in your home. After using these items, you should wash them thoroughly with soap and water.  Cover your coughs and sneezes Cover your mouth and nose with a tissue when you cough or sneeze, or you can cough or sneeze into your sleeve. Throw used tissues in a lined trash can, and immediately wash your hands with soap and water for at least 20 seconds or use an alcohol-based hand rub.  Wash your [facility] your hands often and thoroughly with soap and water for at least 20 seconds. You can use an alcohol-based hand sanitizer if soap and water are not available and if your hands are not visibly dirty. Avoid touching your eyes, nose, and mouth with unwashed hands.   Prevention Steps for Caregivers and Household Members of Individuals Confirmed to have, or Being Evaluated for, COVID-19 Infection Being Cared for in the Home  If you live with, or provide care at home for, a person confirmed  to have, or being evaluated for, COVID-19 infection please follow these guidelines to prevent infection:  Follow healthcare provider's instructions Make sure that you understand and can help the patient follow any healthcare provider instructions for all care.  Provide for the patient's basic needs You should help the patient with basic needs in the home and provide support for getting groceries, prescriptions, and other personal needs.  Monitor the patient's symptoms If they are getting sicker, call his or her medical provider and tell them that the patient has, or is being evaluated for, COVID-19 infection.  This will help the healthcare provider's office take steps to keep other people from getting infected. Ask the healthcare provider to call the local or state health department.  Limit the number of people who have contact with the patient  If possible, have only one caregiver for the patient.  Other household members should stay in another home or place of residence. If this is not possible, they should stay  in another room, or be separated from the patient as much as possible. Use a separate bathroom, if available.  Restrict visitors who do not have an essential need to be in the home.  Keep older adults, very young children, and other sick people away from the patient Keep older adults, very young children, and those who have compromised immune systems or chronic health conditions away from the patient. This includes people with chronic heart, lung, or kidney conditions, diabetes, and cancer.  Ensure good ventilation Make sure that shared spaces in the home have good air flow, such as from an air conditioner or an opened window, weather permitting.  Wash your hands often  Wash your hands often and thoroughly with soap and water for at least 20 seconds. You can use an alcohol based hand sanitizer if soap and water are not available and if your hands are not visibly dirty.  Avoid touching your eyes, nose, and mouth with unwashed hands.  Use disposable paper towels to dry your hands. If not available, use dedicated cloth towels and replace them when they become wet.  Wear a facemask and gloves  Wear a disposable facemask at all times in the room and gloves when you touch or have contact with the patient's blood, body fluids, and/or secretions or excretions, such as sweat, saliva, sputum, nasal mucus, vomit, urine, or feces.  Ensure the mask fits over your nose and mouth tightly, and do not touch it during use.  Throw out disposable facemasks and gloves after using them. Do not  reuse.  Wash your hands immediately after removing your facemask and gloves.  If your personal clothing becomes contaminated, carefully remove clothing and launder. Wash your hands after handling contaminated clothing.  Place all used disposable facemasks, gloves, and other waste in a lined container before disposing them with other household waste.  Remove gloves and wash your hands immediately after handling these items.  Do not share dishes, glasses, or other household items with the patient  Avoid sharing household items. You should not share dishes, drinking glasses, cups, eating utensils, towels, bedding, or other items with a patient who is confirmed to have, or being evaluated for, COVID-19 infection.  After the person uses these items, you should wash them thoroughly with soap and water.  Wash laundry thoroughly  Immediately remove and wash clothes or bedding that have blood, body fluids, and/or secretions or excretions, such as sweat, saliva, sputum, nasal mucus, vomit, urine, or feces, on them.  Wear gloves  when handling laundry from the patient.  Read and follow directions on labels of laundry or clothing items and detergent. In general, wash and dry with the warmest temperatures recommended on the label.  Clean all areas the individual has used often  Clean all touchable surfaces, such as counters, tabletops, doorknobs, bathroom fixtures, toilets, phones, keyboards, tablets, and bedside tables, every day. Also, clean any surfaces that may have blood, body fluids, and/or secretions or excretions on them.  Wear gloves when cleaning surfaces the patient has come in contact with.  Use a diluted bleach solution (e.g., dilute bleach with 1 part bleach and 10 parts water) or a household disinfectant with a label that says EPA-registered for coronaviruses. To make a bleach solution at home, add 1 tablespoon of bleach to 1 quart (4 cups) of water. For a larger supply, add  cup of  bleach to 1 gallon (16 cups) of water.  Read labels of cleaning products and follow recommendations provided on product labels. Labels contain instructions for safe and effective use of the cleaning product including precautions you should take when applying the product, such as wearing gloves or eye protection and making sure you have good ventilation during use of the product.  Remove gloves and wash hands immediately after cleaning.  Monitor yourself for signs and symptoms of illness Caregivers and household members are considered close contacts, should monitor their health, and will be asked to limit movement outside of the home to the extent possible. Follow the monitoring steps for close contacts listed on the symptom monitoring form.   ? If you have additional questions, contact your local health department or call the epidemiologist on call at [phone] (available 24/7). ? This guidance is subject to change. For the most up-to-date guidance from CDC, please refer to their website: TripMetro.hu    Activity:  As tolerated   Discharge Instructions    Call MD for:  difficulty breathing, headache or visual disturbances   Complete by: As directed    Call MD for:  extreme fatigue   Complete by: As directed    Call MD for:  persistant nausea and vomiting   Complete by: As directed    Call MD for:  severe uncontrolled pain   Complete by: As directed    Diet - low sodium heart healthy   Complete by: As directed    Diet Carb Modified   Complete by: As directed    Discharge instructions   Complete by: As directed    1.)  3 weeks of isolation from 04/21/2019  2.) You are scheduled for an outpatient infusion of Remdesivir at  8:30 AM on Sunday 4/18  Please report to [facility] at [address]  Drive to the security guard and tell them you are here for an infusion. They will direct you to the front entrance  where we will come and get you.  For questions call [phone].  Thanks  3.)  You have been referred to cardiology office for an outpatient appointment-as you had atypical chest pain.  Office will call you for an appointment.   Increase activity slowly   Complete by: As directed      Allergies as of 04/30/2019   No Known Allergies     Medication List    STOP taking these medications   aspirin 81 MG tablet   lisinopril-hydrochlorothiazide 20-25 MG tablet Commonly known as: ZESTORETIC     TAKE these medications   ALPRAZolam 0.25 MG tablet  Commonly known as: XANAX Take 1 tablet (0.25 mg total) by mouth 3 (three) times daily as needed for anxiety.   amLODipine 10 MG tablet Commonly known as: NORVASC Take 10 mg by mouth every morning.   dexamethasone 6 MG tablet Commonly known as: DECADRON Take 1 tablet (6 mg total) by mouth daily.   insulin aspart protamine- aspart (70-30) 100 UNIT/ML injection Commonly known as: NOVOLOG MIX 70/30 Inject 0.18-0.35 mLs (18-35 Units total) into the skin See admin instructions. Inject 35 units in the morning and 18 units at night What changed:   how much to take  additional instructions   metFORMIN 500 MG tablet Commonly known as: GLUCOPHAGE Take 1 tablet (500 mg total) by mouth 2 (two) times daily. Start taking on: May 01, 2019   pantoprazole 40 MG tablet Commonly known as: PROTONIX Take 1 tablet (40 mg total) by mouth daily at 12 noon.   Rivaroxaban 15 MG Tabs tablet Commonly known as: XARELTO Take 1 tablet (15 mg total) by mouth 2 (two) times daily with a meal.   rivaroxaban 20 MG Tabs tablet Commonly known as: XARELTO Take 1 tablet (20 mg total) by mouth daily with supper. Start taking on: May 21, 2019   simvastatin 40 MG tablet Commonly known as: ZOCOR Take 40 mg by mouth at bedtime.      Follow-up Information    Tolia, Sunit, DO. Call.   Specialties: Cardiology, Radiology, Vascular Surgery Why: To be seen in 10  days for f/u of chest pain. Dr. Odis Hollingshead office will also reach out to you. Contact information: [address] [place] [address] [phone]        Fleet Contras, MD. Schedule an appointment as soon as possible for a visit in 1 week(s).   Specialty: Internal Medicine Contact information: [address] [place] [address] [phone]          No Known Allergies    Other Procedures/Studies: CT Angio Chest PE W and/or Wo Contrast  Result Date: 04/27/2019 CLINICAL DATA:  COVID-19 diagnosis 04/21/2019, short of breath and fever, body aches EXAM: CT ANGIOGRAPHY CHEST WITH CONTRAST TECHNIQUE: Multidetector CT imaging of the chest was performed using the standard protocol during bolus administration of intravenous contrast. Multiplanar CT image reconstructions and MIPs were obtained to evaluate the vascular anatomy. CONTRAST:  OMNIPAQUE IOHEXOL 350 MG/ML SOLN COMPARISON:  04/27/2019 FINDINGS: Cardiovascular: Evaluation is limited by respiratory motion. There is adequate opacification of the pulmonary vasculature. There are bilateral lower lobe segmental pulmonary emboli. Clot burden is minimal. No evidence of right heart strain. No pericardial effusion. Normal caliber of the thoracic aorta. Moderate atherosclerosis of the aortic arch and coronary vessels. Mediastinum/Nodes: Subcentimeter mediastinal lymph nodes are likely reactive. Thyroid, trachea, and esophagus are unremarkable. Lungs/Pleura: Multifocal bilateral airspace disease, greatest at the lung bases, consistent with COVID-19 pneumonia. No effusion or pneumothorax. Central airways are patent. Upper Abdomen: No acute abnormality. Musculoskeletal: No acute or destructive bony lesions. Reconstructed images demonstrate no additional findings. Review of the MIP images confirms the above findings. IMPRESSION: 1. Bilateral lower lobe segmental pulmonary emboli. Minimal clot burden, with no evidence of right heart  strain. 2. Multifocal bilateral airspace disease consistent with COVID-19 pneumonia. These results were called by telephone at the time of interpretation on 04/27/2019 at 9:42 pm to provider DAN FLOYD , who verbally acknowledged these results. Electronically Signed   By: Sharlet Salina M.D.   On: 04/27/2019 21:42   DG Chest Portable 1 View  Result Date: 04/27/2019 CLINICAL DATA:  COVID, shortness of breath EXAM: PORTABLE CHEST 1 VIEW COMPARISON:  None. FINDINGS: Patchy bilateral opacities primarily affecting mid to lower lung zones. No pleural effusion or pneumothorax. Cardiomediastinal contours are within normal limits for technique. There is calcified plaque along the aortic arch. IMPRESSION: Patchy bilateral opacities likely reflecting COVID-19 pneumonia. Electronically Signed   By: Guadlupe Spanish M.D.   On: 04/27/2019 19:37   DG Chest Port 1V same Day  Result Date: 04/29/2019 CLINICAL DATA:  COVID.  Shortness of breath. EXAM: PORTABLE CHEST 1 VIEW COMPARISON:  04/27/2019. FINDINGS: Heart size normal. Bilateral pulmonary infiltrates again noted. Interim improvement from prior exam. No pleural effusion or pneumothorax. Degenerative change thoracic spine. IMPRESSION: Bilateral pulmonary infiltrates again noted. Interim improvement from prior exam. Electronically Signed   By: Maisie Fus  Register   On: 04/29/2019 13:48   ECHOCARDIOGRAM COMPLETE  Result Date: 04/28/2019    ECHOCARDIOGRAM REPORT   Patient Name:   REMA LIEVANOS Date of Exam: 04/28/2019 Medical Rec #:  [id number]       Height:       60.0 in Accession #:    [id number]      Weight:       173.3 lb Date of Birth:  [birth year]      BSA:          1.756 m Patient Age:    63 years        BP:           132/68 mmHg Patient Gender: F               HR:           89 bpm. Exam Location:  Inpatient Procedure: 2D Echo, Cardiac Doppler and Color Doppler Indications:    I26.02 Pulmonary embolus  History:        Patient has no prior history of Echocardiogram  examinations.                 Risk Factors:Hypertension and Diabetes. COVID-19 Positive.  Sonographer:    Elmarie Shiley Dance Referring Phys: [id number] ARSHAD N KAKRAKANDY IMPRESSIONS  1. Left ventricular ejection fraction, by estimation, is 65 to 70%. The left ventricle has normal function. The left ventricle has no regional wall motion abnormalities. Left ventricular diastolic parameters are consistent with Grade I diastolic dysfunction (impaired relaxation). Elevated left atrial pressure.  2. Right ventricular systolic function is normal. The right ventricular size is normal. There is normal pulmonary artery systolic pressure.  3. The mitral valve is normal in structure. Mild mitral valve regurgitation. No evidence of mitral stenosis.  4. The aortic valve is normal in structure. Aortic valve regurgitation is not visualized. No aortic stenosis is present.  5. The inferior vena cava is normal in size with greater than 50% respiratory variability, suggesting right atrial pressure of 3 mmHg. FINDINGS  Left Ventricle: Left ventricular ejection fraction, by estimation, is 65 to 70%. The left ventricle has normal function. The left ventricle has no regional wall motion abnormalities. The left ventricular internal cavity size was normal in size. There is  no left ventricular hypertrophy. Left ventricular diastolic parameters are consistent with Grade I diastolic dysfunction (impaired relaxation). Elevated left atrial pressure. Right Ventricle: The right ventricular size is normal. No increase in right ventricular wall thickness. Right ventricular systolic function is normal. There is normal pulmonary artery systolic pressure. Left Atrium: Left atrial size was normal in size. Right Atrium: Right atrial size was normal in size.  Pericardium: There is no evidence of pericardial effusion. Mitral Valve: The mitral valve is normal in structure. Normal mobility of the mitral valve leaflets. Mild mitral valve regurgitation. No evidence of  mitral valve stenosis. Tricuspid Valve: The tricuspid valve is normal in structure. Tricuspid valve regurgitation is trivial. No evidence of tricuspid stenosis. Aortic Valve: The aortic valve is normal in structure. Aortic valve regurgitation is not visualized. No aortic stenosis is present. Pulmonic Valve: The pulmonic valve was normal in structure. Pulmonic valve regurgitation is mild. No evidence of pulmonic stenosis. Aorta: The aortic root is normal in size and structure. Venous: The inferior vena cava is normal in size with greater than 50% respiratory variability, suggesting right atrial pressure of 3 mmHg. IAS/Shunts: No atrial level shunt detected by color flow Doppler.  LEFT VENTRICLE PLAX 2D LVIDd:         4.10 cm  Diastology LVIDs:         2.56 cm  LV e' lateral:   6.20 cm/s LV PW:         1.29 cm  LV E/e' lateral: 17.6 LV IVS:        0.93 cm  LV e' medial:    8.38 cm/s LVOT diam:     1.90 cm  LV E/e' medial:  13.0 LV SV:         68 LV SV Index:   39 LVOT Area:     2.84 cm  RIGHT VENTRICLE             IVC RV Basal diam:  2.64 cm     IVC diam: 1.28 cm RV S prime:     18.30 cm/s TAPSE (M-mode): 2.0 cm LEFT ATRIUM             Index       RIGHT ATRIUM           Index LA diam:        3.70 cm 2.11 cm/m  RA Area:     16.00 cm LA Vol (A2C):   55.4 ml 31.54 ml/m RA Volume:   43.20 ml  24.60 ml/m LA Vol (A4C):   34.7 ml 19.76 ml/m LA Biplane Vol: 46.5 ml 26.48 ml/m  AORTIC VALVE LVOT Vmax:   120.00 cm/s LVOT Vmean:  72.300 cm/s LVOT VTI:    0.239 m  AORTA Ao Root diam: 3.10 cm Ao Asc diam:  3.40 cm MITRAL VALVE MV Area (PHT): 3.27 cm     SHUNTS MV Decel Time: 232 msec     Systemic VTI:  0.24 m MV E velocity: 109.00 cm/s  Systemic Diam: 1.90 cm MV A velocity: 119.00 cm/s MV E/A ratio:  0.92 Tobias Alexander MD Electronically signed by Tobias Alexander MD Signature Date/Time: 04/28/2019/9:08:57 PM    Final    VAS [place] LOWER EXTREMITY VENOUS (DVT)  Result Date: 04/28/2019  Lower Venous DVTStudy Indications:  Pulmonary embolism.  Risk Factors: COVID 19 positive. Limitations: Body habitus, poor ultrasound/tissue interface and patient positioning. Comparison Study: No prior studies. Performing Technologist: Chanda Busing RVT  Examination Guidelines: A complete evaluation includes B-mode imaging, spectral Doppler, color Doppler, and power Doppler as needed of all accessible portions of each vessel. Bilateral testing is considered an integral part of a complete examination. Limited examinations for reoccurring indications may be performed as noted. The reflux portion of the exam is performed with the patient in reverse Trendelenburg.  +---------+---------------+---------+-----------+----------+--------------+ RIGHT    CompressibilityPhasicitySpontaneityPropertiesThrombus Aging +---------+---------------+---------+-----------+----------+--------------+ CFV      Full  Yes      Yes                                 +---------+---------------+---------+-----------+----------+--------------+ SFJ      Full                                                        +---------+---------------+---------+-----------+----------+--------------+ FV Prox  Full                                                        +---------+---------------+---------+-----------+----------+--------------+ FV Mid   Full                                                        +---------+---------------+---------+-----------+----------+--------------+ FV DistalFull                                                        +---------+---------------+---------+-----------+----------+--------------+ PFV      Full                                                        +---------+---------------+---------+-----------+----------+--------------+ POP      Full           Yes      Yes                                 +---------+---------------+---------+-----------+----------+--------------+ PTV      Full                                                         +---------+---------------+---------+-----------+----------+--------------+ PERO     Full                                                        +---------+---------------+---------+-----------+----------+--------------+   +---------+---------------+---------+-----------+----------+--------------+ LEFT     CompressibilityPhasicitySpontaneityPropertiesThrombus Aging +---------+---------------+---------+-----------+----------+--------------+ CFV      Full           Yes      Yes                                 +---------+---------------+---------+-----------+----------+--------------+ SFJ  Full                                                        +---------+---------------+---------+-----------+----------+--------------+ FV Prox  Full                                                        +---------+---------------+---------+-----------+----------+--------------+ FV Mid   Full                                                        +---------+---------------+---------+-----------+----------+--------------+ FV DistalFull                                                        +---------+---------------+---------+-----------+----------+--------------+ PFV      Full                                                        +---------+---------------+---------+-----------+----------+--------------+ POP      Full           Yes      Yes                                 +---------+---------------+---------+-----------+----------+--------------+ PTV      Full                                                        +---------+---------------+---------+-----------+----------+--------------+ PERO     Full                                                        +---------+---------------+---------+-----------+----------+--------------+     Summary: RIGHT: - There is no evidence of deep vein thrombosis in  the lower extremity.  - No cystic structure found in the popliteal fossa.  LEFT: - There is no evidence of deep vein thrombosis in the lower extremity.  - No cystic structure found in the popliteal fossa.  *See table(s) above for measurements and observations. Electronically signed by Lemar Livings MD on 04/28/2019 at 4:16:56 PM.    Final    VAS [place] UPPER EXTREMITY VENOUS DUPLEX  Result Date: 04/28/2019 UPPER VENOUS STUDY  Indications: Swelling Risk Factors: COVID 19 positive. Limitations: Bandages. Comparison Study: No prior studies. Performing Technologist: Chanda Busing RVT  Examination Guidelines: A complete  evaluation includes B-mode imaging, spectral Doppler, color Doppler, and power Doppler as needed of all accessible portions of each vessel. Bilateral testing is considered an integral part of a complete examination. Limited examinations for reoccurring indications may be performed as noted.  Right Findings: +----------+------------+---------+-----------+----------+-------+ RIGHT     CompressiblePhasicitySpontaneousPropertiesSummary +----------+------------+---------+-----------+----------+-------+ IJV           Full       Yes       No                       +----------+------------+---------+-----------+----------+-------+ Subclavian    Full       Yes       No                       +----------+------------+---------+-----------+----------+-------+ Axillary      Full       Yes       No                       +----------+------------+---------+-----------+----------+-------+ Brachial      Full       Yes       No                       +----------+------------+---------+-----------+----------+-------+ Radial        Full                                          +----------+------------+---------+-----------+----------+-------+ Ulnar         Full                                          +----------+------------+---------+-----------+----------+-------+ Cephalic       Full                                          +----------+------------+---------+-----------+----------+-------+ Basilic       Full                                          +----------+------------+---------+-----------+----------+-------+  Left Findings: +----------+------------+---------+-----------+----------+-------+ LEFT      CompressiblePhasicitySpontaneousPropertiesSummary +----------+------------+---------+-----------+----------+-------+ Subclavian    Full       Yes       Yes                      +----------+------------+---------+-----------+----------+-------+  Summary:  Right: No evidence of deep vein thrombosis in the upper extremity. No evidence of superficial vein thrombosis in the upper extremity.  Left: No evidence of thrombosis in the subclavian.  *See table(s) above for measurements and observations.  Diagnosing physician: Lemar Livings MD Electronically signed by Lemar Livings MD on 04/28/2019 at 4:17:07 PM.    Final      TODAY-DAY OF DISCHARGE:  Subjective:   Edison Pace today has no headache,no chest abdominal pain,no new weakness tingling or numbness, feels much better wants to go home today.   Objective:   Blood pressure 116/67, pulse 70, temperature 98.3 F (36.8 C), temperature source  Oral, resp. rate (!) 22, height 5' (1.524 m), weight 78.6 kg, SpO2 97 %.  Intake/Output Summary (Last 24 hours) at 04/30/2019 1024 Last data filed at 04/30/2019 0600 Gross per 24 hour  Intake 600 ml  Output --  Net 600 ml   Filed Weights   04/27/19 1813 04/28/19 0111  Weight: 78.5 kg 78.6 kg    Exam: Awake Alert, Oriented *3, No new F.N deficits, Normal affect Boyd.AT,PERRAL Supple Neck,No JVD, No cervical lymphadenopathy appriciated.  Symmetrical Chest wall movement, Good air movement bilaterally, CTAB RRR,No Gallops,Rubs or new Murmurs, No Parasternal Heave +ve B.Sounds, Abd Soft, Non tender, No organomegaly appriciated, No rebound -guarding or  rigidity. No Cyanosis, Clubbing or edema, No new Rash or bruise   PERTINENT RADIOLOGIC STUDIES: CT Angio Chest PE W and/or Wo Contrast  Result Date: 04/27/2019 CLINICAL DATA:  COVID-19 diagnosis 04/21/2019, short of breath and fever, body aches EXAM: CT ANGIOGRAPHY CHEST WITH CONTRAST TECHNIQUE: Multidetector CT imaging of the chest was performed using the standard protocol during bolus administration of intravenous contrast. Multiplanar CT image reconstructions and MIPs were obtained to evaluate the vascular anatomy. CONTRAST:  OMNIPAQUE IOHEXOL 350 MG/ML SOLN COMPARISON:  04/27/2019 FINDINGS: Cardiovascular: Evaluation is limited by respiratory motion. There is adequate opacification of the pulmonary vasculature. There are bilateral lower lobe segmental pulmonary emboli. Clot burden is minimal. No evidence of right heart strain. No pericardial effusion. Normal caliber of the thoracic aorta. Moderate atherosclerosis of the aortic arch and coronary vessels. Mediastinum/Nodes: Subcentimeter mediastinal lymph nodes are likely reactive. Thyroid, trachea, and esophagus are unremarkable. Lungs/Pleura: Multifocal bilateral airspace disease, greatest at the lung bases, consistent with COVID-19 pneumonia. No effusion or pneumothorax. Central airways are patent. Upper Abdomen: No acute abnormality. Musculoskeletal: No acute or destructive bony lesions. Reconstructed images demonstrate no additional findings. Review of the MIP images confirms the above findings. IMPRESSION: 1. Bilateral lower lobe segmental pulmonary emboli. Minimal clot burden, with no evidence of right heart strain. 2. Multifocal bilateral airspace disease consistent with COVID-19 pneumonia. These results were called by telephone at the time of interpretation on 04/27/2019 at 9:42 pm to provider DAN FLOYD , who verbally acknowledged these results. Electronically Signed   By: Sharlet Salina M.D.   On: 04/27/2019 21:42   DG Chest Portable 1  View  Result Date: 04/27/2019 CLINICAL DATA:  COVID, shortness of breath EXAM: PORTABLE CHEST 1 VIEW COMPARISON:  None. FINDINGS: Patchy bilateral opacities primarily affecting mid to lower lung zones. No pleural effusion or pneumothorax. Cardiomediastinal contours are within normal limits for technique. There is calcified plaque along the aortic arch. IMPRESSION: Patchy bilateral opacities likely reflecting COVID-19 pneumonia. Electronically Signed   By: Guadlupe Spanish M.D.   On: 04/27/2019 19:37   DG Chest Port 1V same Day  Result Date: 04/29/2019 CLINICAL DATA:  COVID.  Shortness of breath. EXAM: PORTABLE CHEST 1 VIEW COMPARISON:  04/27/2019. FINDINGS: Heart size normal. Bilateral pulmonary infiltrates again noted. Interim improvement from prior exam. No pleural effusion or pneumothorax. Degenerative change thoracic spine. IMPRESSION: Bilateral pulmonary infiltrates again noted. Interim improvement from prior exam. Electronically Signed   By: Maisie Fus  Register   On: 04/29/2019 13:48   ECHOCARDIOGRAM COMPLETE  Result Date: 04/28/2019    ECHOCARDIOGRAM REPORT   Patient Name:   Diane Willis Date of Exam: 04/28/2019 Medical Rec #:  [id number]       Height:       60.0 in Accession #:    [id number]  Weight:       173.3 lb Date of Birth:  16-May-1955      BSA:          1.756 m Patient Age:    63 years        BP:           132/68 mmHg Patient Gender: F               HR:           89 bpm. Exam Location:  Inpatient Procedure: 2D Echo, Cardiac Doppler and Color Doppler Indications:    I26.02 Pulmonary embolus  History:        Patient has no prior history of Echocardiogram examinations.                 Risk Factors:Hypertension and Diabetes. COVID-19 Positive.  Sonographer:    Elmarie Shiley Dance Referring Phys: [id number] ARSHAD N KAKRAKANDY IMPRESSIONS  1. Left ventricular ejection fraction, by estimation, is 65 to 70%. The left ventricle has normal function. The left ventricle has no regional wall motion abnormalities.  Left ventricular diastolic parameters are consistent with Grade I diastolic dysfunction (impaired relaxation). Elevated left atrial pressure.  2. Right ventricular systolic function is normal. The right ventricular size is normal. There is normal pulmonary artery systolic pressure.  3. The mitral valve is normal in structure. Mild mitral valve regurgitation. No evidence of mitral stenosis.  4. The aortic valve is normal in structure. Aortic valve regurgitation is not visualized. No aortic stenosis is present.  5. The inferior vena cava is normal in size with greater than 50% respiratory variability, suggesting right atrial pressure of 3 mmHg. FINDINGS  Left Ventricle: Left ventricular ejection fraction, by estimation, is 65 to 70%. The left ventricle has normal function. The left ventricle has no regional wall motion abnormalities. The left ventricular internal cavity size was normal in size. There is  no left ventricular hypertrophy. Left ventricular diastolic parameters are consistent with Grade I diastolic dysfunction (impaired relaxation). Elevated left atrial pressure. Right Ventricle: The right ventricular size is normal. No increase in right ventricular wall thickness. Right ventricular systolic function is normal. There is normal pulmonary artery systolic pressure. Left Atrium: Left atrial size was normal in size. Right Atrium: Right atrial size was normal in size. Pericardium: There is no evidence of pericardial effusion. Mitral Valve: The mitral valve is normal in structure. Normal mobility of the mitral valve leaflets. Mild mitral valve regurgitation. No evidence of mitral valve stenosis. Tricuspid Valve: The tricuspid valve is normal in structure. Tricuspid valve regurgitation is trivial. No evidence of tricuspid stenosis. Aortic Valve: The aortic valve is normal in structure. Aortic valve regurgitation is not visualized. No aortic stenosis is present. Pulmonic Valve: The pulmonic valve was normal in  structure. Pulmonic valve regurgitation is mild. No evidence of pulmonic stenosis. Aorta: The aortic root is normal in size and structure. Venous: The inferior vena cava is normal in size with greater than 50% respiratory variability, suggesting right atrial pressure of 3 mmHg. IAS/Shunts: No atrial level shunt detected by color flow Doppler.  LEFT VENTRICLE PLAX 2D LVIDd:         4.10 cm  Diastology LVIDs:         2.56 cm  LV e' lateral:   6.20 cm/s LV PW:         1.29 cm  LV E/e' lateral: 17.6 LV IVS:        0.93 cm  LV e' medial:  8.38 cm/s LVOT diam:     1.90 cm  LV E/e' medial:  13.0 LV SV:         68 LV SV Index:   39 LVOT Area:     2.84 cm  RIGHT VENTRICLE             IVC RV Basal diam:  2.64 cm     IVC diam: 1.28 cm RV S prime:     18.30 cm/s TAPSE (M-mode): 2.0 cm LEFT ATRIUM             Index       RIGHT ATRIUM           Index LA diam:        3.70 cm 2.11 cm/m  RA Area:     16.00 cm LA Vol (A2C):   55.4 ml 31.54 ml/m RA Volume:   43.20 ml  24.60 ml/m LA Vol (A4C):   34.7 ml 19.76 ml/m LA Biplane Vol: 46.5 ml 26.48 ml/m  AORTIC VALVE LVOT Vmax:   120.00 cm/s LVOT Vmean:  72.300 cm/s LVOT VTI:    0.239 m  AORTA Ao Root diam: 3.10 cm Ao Asc diam:  3.40 cm MITRAL VALVE MV Area (PHT): 3.27 cm     SHUNTS MV Decel Time: 232 msec     Systemic VTI:  0.24 m MV E velocity: 109.00 cm/s  Systemic Diam: 1.90 cm MV A velocity: 119.00 cm/s MV E/A ratio:  0.92 Tobias Alexander MD Electronically signed by Tobias Alexander MD Signature Date/Time: 04/28/2019/9:08:57 PM    Final    VAS [place] LOWER EXTREMITY VENOUS (DVT)  Result Date: 04/28/2019  Lower Venous DVTStudy Indications: Pulmonary embolism.  Risk Factors: COVID 19 positive. Limitations: Body habitus, poor ultrasound/tissue interface and patient positioning. Comparison Study: No prior studies. Performing Technologist: Chanda Busing RVT  Examination Guidelines: A complete evaluation includes B-mode imaging, spectral Doppler, color Doppler, and power Doppler as  needed of all accessible portions of each vessel. Bilateral testing is considered an integral part of a complete examination. Limited examinations for reoccurring indications may be performed as noted. The reflux portion of the exam is performed with the patient in reverse Trendelenburg.  +---------+---------------+---------+-----------+----------+--------------+ RIGHT    CompressibilityPhasicitySpontaneityPropertiesThrombus Aging +---------+---------------+---------+-----------+----------+--------------+ CFV      Full           Yes      Yes                                 +---------+---------------+---------+-----------+----------+--------------+ SFJ      Full                                                        +---------+---------------+---------+-----------+----------+--------------+ FV Prox  Full                                                        +---------+---------------+---------+-----------+----------+--------------+ FV Mid   Full                                                        +---------+---------------+---------+-----------+----------+--------------+  FV DistalFull                                                        +---------+---------------+---------+-----------+----------+--------------+ PFV      Full                                                        +---------+---------------+---------+-----------+----------+--------------+ POP      Full           Yes      Yes                                 +---------+---------------+---------+-----------+----------+--------------+ PTV      Full                                                        +---------+---------------+---------+-----------+----------+--------------+ PERO     Full                                                        +---------+---------------+---------+-----------+----------+--------------+    +---------+---------------+---------+-----------+----------+--------------+ LEFT     CompressibilityPhasicitySpontaneityPropertiesThrombus Aging +---------+---------------+---------+-----------+----------+--------------+ CFV      Full           Yes      Yes                                 +---------+---------------+---------+-----------+----------+--------------+ SFJ      Full                                                        +---------+---------------+---------+-----------+----------+--------------+ FV Prox  Full                                                        +---------+---------------+---------+-----------+----------+--------------+ FV Mid   Full                                                        +---------+---------------+---------+-----------+----------+--------------+ FV DistalFull                                                        +---------+---------------+---------+-----------+----------+--------------+  PFV      Full                                                        +---------+---------------+---------+-----------+----------+--------------+ POP      Full           Yes      Yes                                 +---------+---------------+---------+-----------+----------+--------------+ PTV      Full                                                        +---------+---------------+---------+-----------+----------+--------------+ PERO     Full                                                        +---------+---------------+---------+-----------+----------+--------------+     Summary: RIGHT: - There is no evidence of deep vein thrombosis in the lower extremity.  - No cystic structure found in the popliteal fossa.  LEFT: - There is no evidence of deep vein thrombosis in the lower extremity.  - No cystic structure found in the popliteal fossa.  *See table(s) above for measurements and observations. Electronically signed  by Lemar LivingsBrandon Cain MD on 04/28/2019 at 4:16:56 PM.    Final    VAS [place] UPPER EXTREMITY VENOUS DUPLEX  Result Date: 04/28/2019 UPPER VENOUS STUDY  Indications: Swelling Risk Factors: COVID 19 positive. Limitations: Bandages. Comparison Study: No prior studies. Performing Technologist: Chanda BusingGregory Collins RVT  Examination Guidelines: A complete evaluation includes B-mode imaging, spectral Doppler, color Doppler, and power Doppler as needed of all accessible portions of each vessel. Bilateral testing is considered an integral part of a complete examination. Limited examinations for reoccurring indications may be performed as noted.  Right Findings: +----------+------------+---------+-----------+----------+-------+ RIGHT     CompressiblePhasicitySpontaneousPropertiesSummary +----------+------------+---------+-----------+----------+-------+ IJV           Full       Yes       No                       +----------+------------+---------+-----------+----------+-------+ Subclavian    Full       Yes       No                       +----------+------------+---------+-----------+----------+-------+ Axillary      Full       Yes       No                       +----------+------------+---------+-----------+----------+-------+ Brachial      Full       Yes       No                       +----------+------------+---------+-----------+----------+-------+ Radial  Full                                          +----------+------------+---------+-----------+----------+-------+ Ulnar         Full                                          +----------+------------+---------+-----------+----------+-------+ Cephalic      Full                                          +----------+------------+---------+-----------+----------+-------+ Basilic       Full                                          +----------+------------+---------+-----------+----------+-------+  Left Findings:  +----------+------------+---------+-----------+----------+-------+ LEFT      CompressiblePhasicitySpontaneousPropertiesSummary +----------+------------+---------+-----------+----------+-------+ Subclavian    Full       Yes       Yes                      +----------+------------+---------+-----------+----------+-------+  Summary:  Right: No evidence of deep vein thrombosis in the upper extremity. No evidence of superficial vein thrombosis in the upper extremity.  Left: No evidence of thrombosis in the subclavian.  *See table(s) above for measurements and observations.  Diagnosing physician: Lemar Livings MD Electronically signed by Lemar Livings MD on 04/28/2019 at 4:17:07 PM.    Final      PERTINENT LAB RESULTS: CBC: Recent Labs    04/29/19 0743 04/30/19 0724  WBC 9.5 8.9  HGB 8.7* 9.0*  HCT 26.3* 27.6*  PLT 313 332   CMET CMP     Component Value Date/Time   NA 136 04/30/2019 0724   K 3.7 04/30/2019 0724   CL 99 04/30/2019 0724   CO2 26 04/30/2019 0724   GLUCOSE 55 (L) 04/30/2019 0724   BUN 41 (H) 04/30/2019 0724   CREATININE 1.44 (H) 04/30/2019 0724   CALCIUM 8.8 (L) 04/30/2019 0724   PROT 6.2 (L) 04/30/2019 0724   ALBUMIN 2.9 (L) 04/30/2019 0724   AST 32 04/30/2019 0724   ALT 23 04/30/2019 0724   ALKPHOS 48 04/30/2019 0724   BILITOT 0.9 04/30/2019 0724   GFRNONAA 39 (L) 04/30/2019 0724   GFRAA 45 (L) 04/30/2019 0724    GFR Estimated Creatinine Clearance: 38 mL/min (A) (by C-G formula based on SCr of 1.44 mg/dL (H)). No results for input(s): LIPASE, AMYLASE in the last 72 hours. No results for input(s): CKTOTAL, CKMB, CKMBINDEX, TROPONINI in the last 72 hours. Invalid input(s): POCBNP Recent Labs    04/29/19 0743 04/30/19 0724  DDIMER 6.11* 5.30*   No results for input(s): HGBA1C in the last 72 hours. Recent Labs    04/27/19 1915  TRIG 75   No results for input(s): TSH, T4TOTAL, T3FREE, THYROIDAB in the last 72 hours.  Invalid input(s):  FREET3 Recent Labs    04/29/19 0743 04/30/19 0724  FERRITIN 134 95   Coags: No results for input(s): INR in the last 72 hours.  Invalid input(s): PT Microbiology: Recent Results (from the past 240 hour(s))  Blood Culture (routine x 2)     Status: None (Preliminary result)   Collection Time: 04/27/19  7:15 PM   Specimen: BLOOD RIGHT ARM  Result Value Ref Range Status   Specimen Description   Final    BLOOD RIGHT ARM Performed at [facility], [address], [place], [place] [address]    Special Requests   Final    BOTTLES DRAWN AEROBIC AND ANAEROBIC Blood Culture adequate volume Performed at [facility], [address] [place], [place] [address]    Culture   Final    NO GROWTH 3 DAYS Performed at [facility], [address], [place], [place] [address]    Report Status PENDING  Incomplete  MRSA PCR Screening     Status: None   Collection Time: 04/30/19  4:04 AM   Specimen: Nasal Mucosa; Nasopharyngeal  Result Value Ref Range Status   MRSA by PCR NEGATIVE NEGATIVE Final    Comment:        The GeneXpert MRSA Assay (FDA approved for NASAL specimens only), is one component of a comprehensive MRSA colonization surveillance program. It is not intended to diagnose MRSA infection nor to guide or monitor treatment for MRSA infections. Performed at [facility], [address], [place], [place] [address]     FURTHER DISCHARGE INSTRUCTIONS:  Get Medicines reviewed and adjusted: Please take all your medications with you for your next visit with your Primary MD  Laboratory/radiological data: Please request your Primary MD to go over all hospital tests and procedure/radiological results at the follow up, please ask your Primary MD to get all Hospital records sent to his/her office.  In some cases, they will be blood work, cultures and biopsy results pending at the time of your discharge. Please request that your primary care M.D. goes through  all the records of your hospital data and follows up on these results.  Also Note the following: If you experience worsening of your admission symptoms, develop shortness of breath, life threatening emergency, suicidal or homicidal thoughts you must seek medical attention immediately by calling 911 or calling your MD immediately  if symptoms less severe.  You must read complete instructions/literature along with all the possible adverse reactions/side effects for all the Medicines you take and that have been prescribed to you. Take any new Medicines after you have completely understood and accpet all the possible adverse reactions/side effects.   Do not drive when taking Pain medications or sleeping medications (Benzodaizepines)  Do not take more than prescribed Pain, Sleep and Anxiety Medications. It is not advisable to combine anxiety,sleep and pain medications without talking with your primary care practitioner  Special Instructions: If you have smoked or chewed Tobacco  in the last 2 yrs please stop smoking, stop any regular Alcohol  and or any Recreational drug use.  Wear Seat belts while driving.  Please note: You were cared for by a hospitalist during your hospital stay. Once you are discharged, your primary care physician will handle any further medical issues. Please note that NO REFILLS for any discharge medications will be authorized once you are discharged, as it is imperative that you return to your primary care physician (or establish a relationship with a primary care physician if you do not have one) for your post hospital discharge needs so that they can reassess your need for medications and monitor your lab values.  Total Time spent coordinating discharge including counseling, education and face  to face time equals35 minutes.  SignedJeoffrey Massed 04/30/2019 10:24 AM

## 2019-05-01 ENCOUNTER — Ambulatory Visit (HOSPITAL_COMMUNITY)
Admission: RE | Admit: 2019-05-01 | Discharge: 2019-05-01 | Disposition: A | Discharge status: Home / Self Care | Payer: BC Managed Care – PPO | Source: Ambulatory Visit | Attending: Pulmonary Disease | Admitting: Pulmonary Disease

## 2019-05-01 DIAGNOSIS — J1282 Pneumonia due to coronavirus disease 2019: Secondary | ICD-10-CM | POA: Diagnosis not present

## 2019-05-01 DIAGNOSIS — U071 COVID-19: Secondary | ICD-10-CM | POA: Insufficient documentation

## 2019-05-01 MED ORDER — SODIUM CHLORIDE 0.9 % IV SOLN
100.0000 mg | Freq: Once | INTRAVENOUS | Status: AC
  Administered 2019-05-01: 08:00:00 100 mg via INTRAVENOUS
  Filled 2019-05-01: qty 20

## 2019-05-01 MED ORDER — ALBUTEROL SULFATE HFA 108 (90 BASE) MCG/ACT IN AERS: 2.0000 | INHALATION_SPRAY | Freq: Once | RESPIRATORY_TRACT | Status: DC | PRN

## 2019-05-01 MED ORDER — DIPHENHYDRAMINE HCL 50 MG/ML IJ SOLN: 50.0000 mg | Freq: Once | INTRAMUSCULAR | Status: DC | PRN

## 2019-05-01 MED ORDER — FAMOTIDINE IN NACL 20-0.9 MG/50ML-% IV SOLN: 20.0000 mg | Freq: Once | INTRAVENOUS | Status: DC | PRN

## 2019-05-01 MED ORDER — METHYLPREDNISOLONE SODIUM SUCC 125 MG IJ SOLR: 125.0000 mg | Freq: Once | INTRAMUSCULAR | Status: DC | PRN

## 2019-05-01 MED ORDER — EPINEPHRINE 0.3 MG/0.3ML IJ SOAJ: 0.3000 mg | Freq: Once | INTRAMUSCULAR | Status: DC | PRN

## 2019-05-01 MED ORDER — SODIUM CHLORIDE 0.9 % IV SOLN: INTRAVENOUS | Status: DC | PRN

## 2019-05-01 NOTE — Progress Notes (Signed)
  Diagnosis: COVID-19   Physician: Dr. Delford Field  Procedure: [facility] Med: remdesivir infusion - Provided patient with remdesivir fact sheet for patients, parents and caregivers prior to infusion.  Complications: No immediate complications noted.  Discharge: Discharged home   Diane Willis 05/01/2019

## 2019-05-02 LAB — CULTURE, BLOOD (ROUTINE X 2)
Culture: NO GROWTH
Special Requests: ADEQUATE

## 2019-05-03 ENCOUNTER — Other Ambulatory Visit: Payer: Self-pay

## 2019-05-03 ENCOUNTER — Encounter (HOSPITAL_BASED_OUTPATIENT_CLINIC_OR_DEPARTMENT_OTHER): Payer: Self-pay | Admitting: Emergency Medicine

## 2019-05-03 ENCOUNTER — Emergency Department (HOSPITAL_BASED_OUTPATIENT_CLINIC_OR_DEPARTMENT_OTHER)
Admission: EM | Admit: 2019-05-03 | Discharge: 2019-05-03 | Disposition: A | Discharge status: Home / Self Care | Payer: BC Managed Care – PPO | Attending: Emergency Medicine | Admitting: Emergency Medicine

## 2019-05-03 ENCOUNTER — Emergency Department (HOSPITAL_BASED_OUTPATIENT_CLINIC_OR_DEPARTMENT_OTHER): Payer: BC Managed Care – PPO

## 2019-05-03 DIAGNOSIS — I10 Essential (primary) hypertension: Secondary | ICD-10-CM | POA: Insufficient documentation

## 2019-05-03 DIAGNOSIS — R0602 Shortness of breath: Secondary | ICD-10-CM | POA: Diagnosis present

## 2019-05-03 DIAGNOSIS — E119 Type 2 diabetes mellitus without complications: Secondary | ICD-10-CM | POA: Diagnosis not present

## 2019-05-03 DIAGNOSIS — Z86711 Personal history of pulmonary embolism: Secondary | ICD-10-CM | POA: Insufficient documentation

## 2019-05-03 DIAGNOSIS — F419 Anxiety disorder, unspecified: Secondary | ICD-10-CM

## 2019-05-03 DIAGNOSIS — U071 COVID-19: Secondary | ICD-10-CM | POA: Diagnosis not present

## 2019-05-03 DIAGNOSIS — Z79899 Other long term (current) drug therapy: Secondary | ICD-10-CM | POA: Insufficient documentation

## 2019-05-03 DIAGNOSIS — Z8616 Personal history of COVID-19: Secondary | ICD-10-CM

## 2019-05-03 DIAGNOSIS — Z794 Long term (current) use of insulin: Secondary | ICD-10-CM | POA: Diagnosis not present

## 2019-05-03 MED ORDER — HYDROXYZINE HCL 25 MG PO TABS: 25.0000 mg | ORAL_TABLET | Freq: Four times a day (QID) | ORAL | 0 refills | Status: DC | PRN

## 2019-05-03 MED ORDER — HYDROXYZINE HCL 25 MG PO TABS
25.0000 mg | ORAL_TABLET | Freq: Once | ORAL | Status: AC
  Administered 2019-05-03: 25 mg via ORAL
  Filled 2019-05-03: qty 1

## 2019-05-03 NOTE — Discharge Instructions (Signed)
You were seen today for anxiety symptoms and shortness of breath.  Your x-ray shows changes consistent with COVID-19.  Your vital signs are reassuring and your oxygen saturations are normal.  You are in no respiratory distress.  Continue your blood thinner.  Take vistaril as needed for anxiety.  If you have any new or worsening symptoms you should be reevaluated.

## 2019-05-03 NOTE — ED Notes (Signed)
ED Provider at bedside. 

## 2019-05-03 NOTE — ED Provider Notes (Signed)
[facility] EMERGENCY DEPARTMENT Provider Note   CSN: [id number] Arrival date & time: 05/03/19  0308     History Chief Complaint  Patient presents with  . Anxiety    Diane Willis is a 64 y.o. female.  HPI     This is a 64 year old female with a history of diabetes, hypertension, recent COVID-19 infection with hypoxic respiratory failure and bilateral PEs now on anticoagulation who presents with feeling anxious and short of breath.  Patient reports that she felt fine before she went to bed last night.  She woke up this morning with a feeling of anxiety.  This progressed and she developed shortness of breath.  She has not had any ongoing fevers or cough.  She did not get discharged with oxygen.  She was just discharged from the hospital on 4/17 after being admitted for hypoxic respiratory failure secondary to COVID-19 and bilateral PEs.  She received steroids and remdesivir.  She also was placed on anticoagulation.  She was discharged without home oxygen.  Patient denies any chest pain.  She states that she has generally felt well post hospitalization.  She is anxious regarding her current illness.  She does have Xanax which she last took last night for her anxiety.  Past Medical History:  Diagnosis Date  . Diabetes mellitus without complication (HCC)   . Hypertension     Patient Active Problem List   Diagnosis Date Noted  . Anemia 04/28/2019  . Hypertension 04/28/2019  . ARF (acute renal failure) (HCC) 04/28/2019  . Acute respiratory failure with hypoxia (HCC) 04/28/2019  . Acute hypoxemic respiratory failure due to COVID-19 (HCC) 04/27/2019  . Bilateral pulmonary embolism (HCC) 04/27/2019    Past Surgical History:  Procedure Laterality Date  . ABDOMINAL HYSTERECTOMY       OB History   No obstetric history on file.     Family History  Problem Relation Age of Onset  . CAD Neg Hx   . Diabetes Mellitus II Neg Hx     Social History   Tobacco Use  .  Smoking status: Never Smoker  . Smokeless tobacco: Never Used  Substance Use Topics  . Alcohol use: No  . Drug use: No    Home Medications Prior to Admission medications   Medication Sig Start Date End Date Taking? Authorizing Provider  ALPRAZolam (XANAX) 0.25 MG tablet Take 1 tablet (0.25 mg total) by mouth 3 (three) times daily as needed for anxiety. 04/30/19   Ghimire, Werner Lean, MD  amLODipine (NORVASC) 10 MG tablet Take 10 mg by mouth every morning. 04/09/19   [provider]  dexamethasone (DECADRON) 6 MG tablet Take 1 tablet (6 mg total) by mouth daily. 04/30/19   Ghimire, Werner Lean, MD  hydrOXYzine (ATARAX/VISTARIL) 25 MG tablet Take 1 tablet (25 mg total) by mouth every 6 (six) hours as needed for anxiety. 05/03/19   Echo Allsbrook, Mayer Masker, MD  insulin aspart protamine- aspart (NOVOLOG MIX 70/30) (70-30) 100 UNIT/ML injection Inject 0.18-0.35 mLs (18-35 Units total) into the skin See admin instructions. Inject 35 units in the morning and 18 units at night 04/30/19   Ghimire, Werner Lean, MD  metFORMIN (GLUCOPHAGE) 500 MG tablet Take 1 tablet (500 mg total) by mouth 2 (two) times daily. 05/01/19   Ghimire, Werner Lean, MD  pantoprazole (PROTONIX) 40 MG tablet Take 1 tablet (40 mg total) by mouth daily at 12 noon. 04/30/19   Ghimire, Werner Lean, MD  Rivaroxaban (XARELTO) 15 MG TABS tablet Take  1 tablet (15 mg total) by mouth 2 (two) times daily with a meal. 04/30/19   Ghimire, Henreitta Leber, MD  rivaroxaban (XARELTO) 20 MG TABS tablet Take 1 tablet (20 mg total) by mouth daily with supper. 05/21/19   Ghimire, Henreitta Leber, MD  simvastatin (ZOCOR) 40 MG tablet Take 40 mg by mouth at bedtime. 02/06/19   [provider]    Allergies    Patient has no known allergies.  Review of Systems   Review of Systems  Constitutional: Negative for chills and fever.  Respiratory: Positive for shortness of breath. Negative for cough.   Cardiovascular: Negative for chest pain and leg swelling.    Gastrointestinal: Negative for abdominal pain, nausea and vomiting.  Genitourinary: Negative for dysuria.  All other systems reviewed and are negative.   Physical Exam Updated Vital Signs BP (!) 167/78   Pulse (!) 108   Temp 98.7 F (37.1 C)   Resp 16   Ht 1.575 m (5\' 2" )   Wt 78.6 kg   SpO2 100%   BMI 31.69 kg/m   Physical Exam Vitals and nursing note reviewed.  Constitutional:      Appearance: She is well-developed. She is not ill-appearing.  HENT:     Head: Normocephalic and atraumatic.     Nose: Nose normal.     Mouth/Throat:     Mouth: Mucous membranes are moist.  Eyes:     Pupils: Pupils are equal, round, and reactive to light.  Cardiovascular:     Rate and Rhythm: Regular rhythm. Tachycardia present.     Heart sounds: Normal heart sounds.  Pulmonary:     Effort: Pulmonary effort is normal. No respiratory distress.     Breath sounds: No wheezing.  Abdominal:     General: Bowel sounds are normal.     Palpations: Abdomen is soft.     Tenderness: There is no abdominal tenderness.  Musculoskeletal:        General: No tenderness.     Cervical back: Neck supple.     Right lower leg: No edema.     Left lower leg: No edema.  Skin:    General: Skin is warm and dry.  Neurological:     Mental Status: She is alert and oriented to person, place, and time.  Psychiatric:     Comments: Anxious     ED Results / Procedures / Treatments   Labs (all labs ordered are listed, but only abnormal results are displayed) Labs Reviewed - No data to display  EKG EKG Interpretation  Date/Time:  Tuesday May 03 2019 03:31:37 EDT Ventricular Rate:  109 PR Interval:    QRS Duration: 84 QT Interval:  328 QTC Calculation: 442 R Axis:   25 Text Interpretation: Sinus tachycardia Probable left atrial enlargement Confirmed by Thayer Jew [id number]) on 05/03/2019 3:45:35 AM   Radiology DG Chest Portable 1 View  Result Date: 05/03/2019 CLINICAL DATA:  Shortness of breath.  COVID positive. EXAM: PORTABLE CHEST 1 VIEW COMPARISON:  One-view chest x-ray 04/29/2019. FINDINGS: Heart size is normal. Atherosclerotic changes are present at the arch. Patchy airspace opacities are present in the lower lobes bilaterally, slightly increased from the prior exam. No significant consolidation is present. Axial skeleton is unremarkable. IMPRESSION: Slight increase in patchy airspace disease in the lower lobes bilaterally. This is concerning for infection in the setting of COVID-19. Electronically Signed   By: San Morelle M.D.   On: 05/03/2019 04:40    Procedures Procedures (including critical care  time)  Medications Ordered in ED Medications  hydrOXYzine (ATARAX/VISTARIL) tablet 25 mg (25 mg Oral Given 05/03/19 0349)    ED Course  I have reviewed the triage vital signs and the nursing notes.  Pertinent labs & imaging results that were available during my care of the patient were reviewed by me and considered in my medical decision making (see chart for details).    MDM Rules/Calculators/A&P                       Patient presents with concerns for anxiety and shortness of breath.  Recent COVID-19 pneumonia complicated by pulmonary embolism.  She reports compliance with her anticoagulation.  She is overall nontoxic on exam.  Initial vital signs notable for blood pressure 167/78 and a pulse rate of 108.  She is in no respiratory distress.  She reports that shortness of breath came after her feelings of anxiety.  She is satting 100% on room air.  EKG shows no evidence of acute ischemia or arrhythmia.  She is without chest pain.  Repeat chest x-ray shows patchy opacities consistent with COVID-19 which are slightly increased from prior.  Clinically, this chest x-ray likely lags behind somewhat.  Patient was given Vistaril for her anxiety.  On recheck, she states she feels much better and her heart rate is now 98.  Given her reported compliance with her anticoagulation O2 sats of  100%, doubt worsening PEs.  Patient reassured.  Will discharge with Vistaril.  After history, exam, and medical workup I feel the patient has been appropriately medically screened and is safe for discharge home. Pertinent diagnoses were discussed with the patient. Patient was given return precautions.  Final Clinical Impression(s) / ED Diagnoses Final diagnoses:  Anxiety  SOB (shortness of breath)  History of COVID-19    Rx / DC Orders ED Discharge Orders         Ordered    hydrOXYzine (ATARAX/VISTARIL) 25 MG tablet  Every 6 hours PRN     05/03/19 0450           Shon Baton, MD 05/03/19 [id number]

## 2019-05-03 NOTE — ED Triage Notes (Signed)
Pt reports awaking tonight feeling anxious. Denies any pain.

## 2019-05-11 ENCOUNTER — Ambulatory Visit: Payer: Self-pay | Admitting: Cardiology

## 2019-05-12 ENCOUNTER — Other Ambulatory Visit: Payer: Self-pay | Admitting: Internal Medicine

## 2019-05-12 DIAGNOSIS — E2839 Other primary ovarian failure: Secondary | ICD-10-CM

## 2019-05-20 NOTE — Progress Notes (Deleted)
Primary Physician/Referring:  Fleet Contras, MD  Patient ID: Diane Willis, female    DOB: 05-Oct-1955, 64 y.o.   MRN: [id number]  No chief complaint on file.  HPI:    Diane Willis  is a 64 y.o. ***femalewith pulmonary hypertension, Diabetes Melllitus Type 2 and hyperlipidemia who was admitted to the hospital on 04/14 due to cough, and shortness of breath, hospital evaluation revealed acute hypoxemic respiratory failure secondary to COVID-19 pneumonia and pulmonary embolism.   Past Medical History:  Diagnosis Date  . Diabetes mellitus without complication (HCC)   . Hypertension    Past Surgical History:  Procedure Laterality Date  . ABDOMINAL HYSTERECTOMY     Family History  Problem Relation Age of Onset  . CAD Neg Hx   . Diabetes Mellitus II Neg Hx     Social History   Tobacco Use  . Smoking status: Never Smoker  . Smokeless tobacco: Never Used  Substance Use Topics  . Alcohol use: No   Marital Status: Married  ROS  ***Review of Systems  Constitution: Negative for weight gain.  Cardiovascular: Negative for dyspnea on exertion, leg swelling and syncope.  Respiratory: Negative for shortness of breath.   Musculoskeletal: Negative for joint swelling.   Objective  There were no vitals taken for this visit.  Vitals with BMI 05/03/2019 05/03/2019 05/03/2019  Height - - -  Weight - - -  BMI - - -  Systolic - 162 167  Diastolic - 72 78  Pulse 88 99 332     ***Physical Exam  Constitutional: She appears well-developed and well-nourished. No distress.  Cardiovascular: Normal rate, regular rhythm and intact distal pulses. Exam reveals no gallop.  No murmur heard. No leg edema. No JVD.    Pulmonary/Chest: Effort normal and breath sounds normal. No accessory muscle usage. No respiratory distress.  Abdominal: Soft.   Laboratory examination:   Recent Labs    04/28/19 0900 04/29/19 0743 04/30/19 0724  NA 130* 132* 136  K 4.2 4.2 3.7  CL 91* 95* 99  CO2 22 25  26   GLUCOSE 185* 135* 55*  BUN 23 38* 41*  CREATININE 1.17* 1.50* 1.44*  CALCIUM 8.7* 8.5* 8.8*  GFRNONAA 50* 37* 39*  GFRAA 58* 43* 45*   CrCl cannot be calculated (Patient's most recent lab result is older than the maximum 21 days allowed.).  CMP Latest Ref Rng & Units 04/30/2019 04/29/2019 04/28/2019  Glucose 70 - 99 mg/dL 04/30/2019) 95(J) 884(Z)  BUN 8 - 23 mg/dL 660(Y) 30(Z) 23  Creatinine 0.44 - 1.00 mg/dL 60(F) 0.93(A) 3.55(D)  Sodium 135 - 145 mmol/L 136 132(L) 130(L)  Potassium 3.5 - 5.1 mmol/L 3.7 4.2 4.2  Chloride 98 - 111 mmol/L 99 95(L) 91(L)  CO2 22 - 32 mmol/L 26 25 22   Calcium 8.9 - 10.3 mg/dL 3.22(G) ) 2.5(K)  Total Protein 6.5 - 8.1 g/dL 6.2(L) 6.0(L) 7.4  Total Bilirubin 0.3 - 1.2 mg/dL 0.9 0.4 0.8  Alkaline Phos 38 - 126 U/L 48 46 66  AST 15 - 41 U/L 32 26 33  ALT 0 - 44 U/L 23 21 27    CBC Latest Ref Rng & Units 04/30/2019 04/29/2019 04/28/2019  WBC 4.0 - 10.5 K/uL 8.9 9.5 5.5  Hemoglobin 12.0 - 15.0 g/dL 9.0(L) 8.7(L) 11.5(L)  Hematocrit 36.0 - 46.0 % 27.6(L) 26.3(L) 35.2(L)  Platelets 150 - 400 K/uL 332 313 298   Lipid Panel     Component Value Date/Time   TRIG 75 04/27/2019  1915   HEMOGLOBIN A1C No results found for: HGBA1C, MPG TSH No results for input(s): TSH in the last 8760 hours.  COVID-19: LUMIRADX SARS-COV-2 AG TEST: Positive  External labs:   ***None  Medications and allergies  No Known Allergies   Current Outpatient Medications  Medication Instructions  . ALPRAZolam (XANAX) 0.25 mg, Oral, 3 times daily PRN  . amLODipine (NORVASC) 10 mg, Oral,  Every morning - 10a  . dexamethasone (DECADRON) 6 mg, Oral, Daily  . hydrOXYzine (ATARAX/VISTARIL) 25 mg, Oral, Every 6 hours PRN  . insulin aspart protamine- aspart (NOVOLOG MIX 70/30) (70-30) 100 UNIT/ML injection 18-35 Units, Subcutaneous, See admin instructions, Inject 35 units in the morning and 18 units at night  . metFORMIN (GLUCOPHAGE) 500 mg, Oral, 2 times daily  . pantoprazole  (PROTONIX) 40 mg, Oral, Daily  . Rivaroxaban (XARELTO) 15 mg, Oral, 2 times daily with meals  . rivaroxaban (XARELTO) 20 mg, Oral, Daily with supper  . simvastatin (ZOCOR) 40 mg, Oral, Daily at bedtime   Radiology:   CT Chest Angio 04/27/2019:  1. Bilateral lower lobe segmental pulmonary emboli. Minimal clot burden, with no evidence of right heart strain. 2. Multifocal bilateral airspace disease consistent with COVID-19 pneumonia.   Cardiac Studies:   *** Upper Vascular US   04/28/2019:  Right:  No evidence of deep vein thrombosis in the upper extremity. No evidence of superficial vein thrombosis in the upper extremity.  Left: No evidence of thrombosis in the subclavian.   Echocardiogram 04/28/2019:  1. Left ventricular ejection fraction, by estimation, is 65 to 70%. The left ventricle has normal function. The left ventricle has no regional wall motion abnormalities. Left ventricular diastolic parameters are consistent with Grade I diastolic dysfunction (impaired relaxation). Elevated left atrial pressure.  2. Right ventricular systolic function is normal. The right ventricular size is normal. There is normal pulmonary artery systolic pressure.  3. The mitral valve is normal in structure. Mild mitral valve regurgitation. No evidence of mitral stenosis.  4. The aortic valve is normal in structure. Aortic valve regurgitation is not visualized. No aortic stenosis is present.  5. The inferior vena cava is normal in size with greater than 50% respiratory variability, suggesting right atrial pressure of 3 mmHg.    EKG  ***  05/03/2019: Sinus tachycardia. Probable left atrial enlargement.  ED EKG 04/27/2019: Sinus tachycardia. Abnormal R-wave progression, early transition. Borderline T abnormalities, lateral leads. Baseline wander in lead(s) II III aVF.  Assessment     ICD-10-CM   1. Bilateral pulmonary embolism (HCC)  I26.99   2. Hypertension, unspecified type  I10   3. Acute  hypoxemic respiratory failure due to COVID-19 (HCC)  U07.1    J96.01      No orders of the defined types were placed in this encounter.   There are no discontinued medications.  Recommendations:   ***  Adrian Prows, MD, [facility] 05/23/2019, 2:27 AM [facility] [place] Pager: [phone] Office: [phone]

## 2019-05-23 ENCOUNTER — Ambulatory Visit: Payer: Self-pay | Admitting: Cardiology

## 2019-05-24 ENCOUNTER — Telehealth: Payer: Self-pay

## 2019-05-24 NOTE — Telephone Encounter (Signed)
Waiting is fine

## 2019-06-09 ENCOUNTER — Other Ambulatory Visit: Payer: Self-pay | Admitting: Internal Medicine

## 2019-06-09 DIAGNOSIS — Z1231 Encounter for screening mammogram for malignant neoplasm of breast: Secondary | ICD-10-CM

## 2019-06-22 NOTE — Progress Notes (Signed)
Primary Physician/Referring:  Fleet Contras, MD  Patient ID: Diane Willis, female    DOB: 18-Sep-1955, 64 y.o.   MRN: [id number]  Chief Complaint  Patient presents with  . Hospitalization Follow-up    Post positive Covid-19 test  . Chest Pain   HPI:    Diane Willis  is a 64 y.o. African Americanfemalewith anxiety, hypertension, Diabetes Melllitus Type 2 and hyperlipidemia who was admitted to the hospital on 04/14 due to cough, and shortness of breath, hospital evaluation revealed acute hypoxemic respiratory failure secondary to COVID-19 pneumonia and pulmonary embolism.  She is a non-smoker.  She states that since then symptoms are completely resolved, she is presently asymptomatic and has resumed all activity including exercising at least for 30 minutes on the treadmill or on the stationary bicycle without any discomfort or dyspnea.  Past Medical History:  Diagnosis Date  . COVID-19 04/2012  . Diabetes mellitus without complication (HCC)   . Hypertension    Past Surgical History:  Procedure Laterality Date  . ABDOMINAL HYSTERECTOMY     Family History  Problem Relation Age of Onset  . Healthy Mother   . COPD Father   . Healthy Sister   . HIV Brother   . Healthy Sister   . HIV Sister   . HIV Brother   . HIV Brother   . HIV Brother   . HIV Brother   . CAD Neg Hx   . Diabetes Mellitus II Neg Hx     Social History   Tobacco Use  . Smoking status: Never Smoker  . Smokeless tobacco: Never Used  Substance Use Topics  . Alcohol use: No   Marital Status: Married  ROS  Review of Systems  Cardiovascular: Negative for chest pain, dyspnea on exertion, leg swelling and syncope.  Gastrointestinal: Negative for melena.   Objective  Blood pressure (!) 160/80, pulse 95, resp. rate 15, height 5\' 2"  (1.575 m), weight 169 lb (76.7 kg), SpO2 100 %.  Vitals with BMI 06/23/2019 06/23/2019 05/03/2019  Height - 5\' 2"  -  Weight - 169 lbs -  BMI - 30.9 -  Systolic 160 184 -    Diastolic 80 88 -  Pulse 95 110 88     Physical Exam  Constitutional: She appears well-developed and well-nourished. No distress.  Cardiovascular: Normal rate, regular rhythm and intact distal pulses. Exam reveals no gallop.  No murmur heard. No leg edema. No JVD.    Pulmonary/Chest: Effort normal and breath sounds normal. No accessory muscle usage. No respiratory distress.  Abdominal: Soft.   Laboratory examination:   Recent Labs    04/28/19 0900 04/29/19 0743 04/30/19 0724  NA 130* 132* 136  K 4.2 4.2 3.7  CL 91* 95* 99  CO2 22 25 26   GLUCOSE 185* 135* 55*  BUN 23 38* 41*  CREATININE 1.17* 1.50* 1.44*  CALCIUM 8.7* 8.5* 8.8*  GFRNONAA 50* 37* 39*  GFRAA 58* 43* 45*   CrCl cannot be calculated (Patient's most recent lab result is older than the maximum 21 days allowed.).  CMP Latest Ref Rng & Units 04/30/2019 04/29/2019 04/28/2019  Glucose 70 - 99 mg/dL 05/02/2019) 05/01/2019) 04/30/2019)  BUN 8 - 23 mg/dL 52(W) 413(K) 23  Creatinine 0.44 - 1.00 mg/dL 440(N) 02(V) 25(D)  Sodium 135 - 145 mmol/L 136 132(L) 130(L)  Potassium 3.5 - 5.1 mmol/L 3.7 4.2 4.2  Chloride 98 - 111 mmol/L 99 95(L) 91(L)  CO2 22 - 32 mmol/L 26 25 22  Calcium 8.9 - 10.3 mg/dL 8.8(L) 8.5(L) 8.7(L)  Total Protein 6.5 - 8.1 g/dL 6.2(L) 6.0(L) 7.4  Total Bilirubin 0.3 - 1.2 mg/dL 0.9 0.4 0.8  Alkaline Phos 38 - 126 U/L 48 46 66  AST 15 - 41 U/L 32 26 33  ALT 0 - 44 U/L 23 21 27    CBC Latest Ref Rng & Units 04/30/2019 04/29/2019 04/28/2019  WBC 4.0 - 10.5 K/uL 8.9 9.5 5.5  Hemoglobin 12.0 - 15.0 g/dL 9.0(L) 8.7(L) 11.5(L)  Hematocrit 36 - 46 % 27.6(L) 26.3(L) 35.2(L)  Platelets 150 - 400 K/uL 332 313 298   Lipid Panel     Component Value Date/Time   TRIG 75 04/27/2019 1915   HEMOGLOBIN A1C No results found for: HGBA1C, MPG TSH No results for input(s): TSH in the last 8760 hours.  COVID-19: LUMIRADX SARS-COV-2 AG TEST: Positive  External labs:  None  Medications and allergies  No Known Allergies    Current Outpatient Medications  Medication Instructions  . ALPRAZolam (XANAX) 0.25 mg, Oral, 3 times daily PRN  . amLODipine (NORVASC) 10 mg, Oral,  Every morning - 10a  . dexamethasone (DECADRON) 6 mg, Oral, Daily  . hydrOXYzine (ATARAX/VISTARIL) 25 mg, Oral, Every 6 hours PRN  . insulin aspart protamine- aspart (NOVOLOG MIX 70/30) (70-30) 100 UNIT/ML injection 18-35 Units, Subcutaneous, See admin instructions, Inject 35 units in the morning and 18 units at night  . metFORMIN (GLUCOPHAGE) 500 mg, Oral, 2 times daily  . pantoprazole (PROTONIX) 40 mg, Oral, Daily  . rivaroxaban (XARELTO) 20 mg, Oral, Daily with supper  . simvastatin (ZOCOR) 40 mg, Oral, Daily at bedtime   Radiology:   CT Chest Angio 04/27/2019:  1. Bilateral lower lobe segmental pulmonary emboli. Minimal clot burden, with no evidence of right heart strain. 2. Multifocal bilateral airspace disease consistent with COVID-19 pneumonia.   Cardiac Studies:   Upper Vascular US  04/28/2019:  Right:  No evidence of deep vein thrombosis in the upper extremity. No evidence of superficial vein thrombosis in the upper extremity.  Left: No evidence of thrombosis in the subclavian.   Echocardiogram 04/28/2019:  1. Left ventricular ejection fraction, by estimation, is 65 to 70%. The left ventricle has normal function. The left ventricle has no regional wall motion abnormalities. Left ventricular diastolic parameters are consistent with Grade I diastolic dysfunction (impaired relaxation). Elevated left atrial pressure.  2. Right ventricular systolic function is normal. The right ventricular size is normal. There is normal pulmonary artery systolic pressure.  3. The mitral valve is normal in structure. Mild mitral valve regurgitation. No evidence of mitral stenosis.  4. The aortic valve is normal in structure. Aortic valve regurgitation is not visualized. No aortic stenosis is present.  5. The inferior vena cava is normal in size with  greater than 50% respiratory variability, suggesting right atrial pressure of 3 mmHg.    EKG  EKG 06/23/2019: Normal sinus rhythm with rate of 95 bpm, normal axis.  No evidence of ischemia, normal EKG. no significant change from 05/03/2019.   ED EKG 04/27/2019: Sinus tachycardia. Abnormal R-wave progression, early transition. Borderline T abnormalities, lateral leads. Baseline wander in lead(s) II III aVF.  Assessment     ICD-10-CM   1. Bilateral pulmonary embolism (HCC)  I26.99 EKG 12-Lead  2. Primary hypertension  I10   3. Hypercholesteremia  E78.00   4. Type 2 diabetes mellitus without complication, without long-term current use of insulin ([place])  E11.9      Recommendations:   Diane Catalina  KYNZLIE Willis  is a 64 y.o. African Americanfemalewith anxiety, hypertension, Diabetes Melllitus Type 2 and hyperlipidemia who was admitted to the hospital on 04/14 due to cough, and shortness of breath, hospital evaluation revealed acute hypoxemic respiratory failure secondary to COVID-19 pneumonia and pulmonary embolism.  She is a non-smoker.  She presents establish cardiac care as she had chest discomfort during hospitalization.  However, she is completely asymptomatic and has returned to full activity.  Her EKG is normal, physical examination is normal.  Her blood pressure is elevated.  Goal blood pressure being diabetic is 125/70 mmHg, she had renal insufficiency hence I do not want to start her on ACE inhibitor or an ARB without baseline values, she is already seen Dr. Fleet Contras who is managing her hypertension and hyperlipidemia and she will follow up with him regarding this.  I will see her back on a as needed basis.  She does not need any cardiac work-up.  Yates Decamp, MD, [facility] 06/23/2019, 12:25 PM [facility] Pager: [phone] Office: [phone]

## 2019-06-23 ENCOUNTER — Encounter: Payer: Self-pay | Admitting: Cardiology

## 2019-06-23 ENCOUNTER — Ambulatory Visit: Payer: BC Managed Care – PPO | Admitting: Cardiology

## 2019-06-23 ENCOUNTER — Other Ambulatory Visit: Payer: Self-pay

## 2019-06-23 VITALS — BP 160/80 | HR 95 | Resp 15 | Ht 62.0 in | Wt 169.0 lb

## 2019-06-23 DIAGNOSIS — E119 Type 2 diabetes mellitus without complications: Secondary | ICD-10-CM

## 2019-06-23 DIAGNOSIS — I10 Essential (primary) hypertension: Secondary | ICD-10-CM

## 2019-06-23 DIAGNOSIS — E7800 Pure hypercholesterolemia, unspecified: Secondary | ICD-10-CM

## 2019-06-23 DIAGNOSIS — I2699 Other pulmonary embolism without acute cor pulmonale: Secondary | ICD-10-CM

## 2019-08-25 ENCOUNTER — Other Ambulatory Visit: Payer: BC Managed Care – PPO

## 2019-08-25 ENCOUNTER — Inpatient Hospital Stay: Admission: RE | Admit: 2019-08-25 | Payer: BC Managed Care – PPO | Source: Ambulatory Visit

## 2020-02-02 ENCOUNTER — Other Ambulatory Visit: Payer: Self-pay | Admitting: Internal Medicine

## 2020-02-03 LAB — URINE CULTURE
MICRO NUMBER:: 11438973
SPECIMEN QUALITY:: ADEQUATE

## 2020-08-13 ENCOUNTER — Encounter (HOSPITAL_BASED_OUTPATIENT_CLINIC_OR_DEPARTMENT_OTHER): Payer: Self-pay

## 2020-08-13 ENCOUNTER — Other Ambulatory Visit: Payer: Self-pay

## 2020-08-13 ENCOUNTER — Emergency Department (HOSPITAL_BASED_OUTPATIENT_CLINIC_OR_DEPARTMENT_OTHER)
Admission: EM | Admit: 2020-08-13 | Discharge: 2020-08-13 | Disposition: A | Discharge status: Home / Self Care | Payer: BC Managed Care – PPO | Attending: Emergency Medicine | Admitting: Emergency Medicine

## 2020-08-13 ENCOUNTER — Emergency Department (HOSPITAL_BASED_OUTPATIENT_CLINIC_OR_DEPARTMENT_OTHER): Payer: BC Managed Care – PPO

## 2020-08-13 DIAGNOSIS — Z79899 Other long term (current) drug therapy: Secondary | ICD-10-CM | POA: Diagnosis not present

## 2020-08-13 DIAGNOSIS — K649 Unspecified hemorrhoids: Secondary | ICD-10-CM | POA: Diagnosis not present

## 2020-08-13 DIAGNOSIS — Z7901 Long term (current) use of anticoagulants: Secondary | ICD-10-CM | POA: Insufficient documentation

## 2020-08-13 DIAGNOSIS — E119 Type 2 diabetes mellitus without complications: Secondary | ICD-10-CM | POA: Insufficient documentation

## 2020-08-13 DIAGNOSIS — K602 Anal fissure, unspecified: Secondary | ICD-10-CM | POA: Insufficient documentation

## 2020-08-13 DIAGNOSIS — Z8616 Personal history of COVID-19: Secondary | ICD-10-CM | POA: Diagnosis not present

## 2020-08-13 DIAGNOSIS — Z794 Long term (current) use of insulin: Secondary | ICD-10-CM | POA: Diagnosis not present

## 2020-08-13 DIAGNOSIS — Z20822 Contact with and (suspected) exposure to covid-19: Secondary | ICD-10-CM | POA: Insufficient documentation

## 2020-08-13 DIAGNOSIS — I10 Essential (primary) hypertension: Secondary | ICD-10-CM | POA: Diagnosis not present

## 2020-08-13 DIAGNOSIS — Z7984 Long term (current) use of oral hypoglycemic drugs: Secondary | ICD-10-CM | POA: Insufficient documentation

## 2020-08-13 DIAGNOSIS — K6289 Other specified diseases of anus and rectum: Secondary | ICD-10-CM | POA: Diagnosis present

## 2020-08-13 LAB — CBC WITH DIFFERENTIAL/PLATELET
Abs Immature Granulocytes: 0 10*3/uL (ref 0.00–0.07)
Basophils Absolute: 0 10*3/uL (ref 0.0–0.1)
Basophils Relative: 0 %
Eosinophils Absolute: 0 10*3/uL (ref 0.0–0.5)
Eosinophils Relative: 1 %
HCT: 29.9 % — ABNORMAL LOW (ref 36.0–46.0)
Hemoglobin: 9.8 g/dL — ABNORMAL LOW (ref 12.0–15.0)
Immature Granulocytes: 0 %
Lymphocytes Relative: 31 %
Lymphs Abs: 1.3 10*3/uL (ref 0.7–4.0)
MCH: 35.6 pg — ABNORMAL HIGH (ref 26.0–34.0)
MCHC: 32.8 g/dL (ref 30.0–36.0)
MCV: 108.7 fL — ABNORMAL HIGH (ref 80.0–100.0)
Monocytes Absolute: 0.2 10*3/uL (ref 0.1–1.0)
Monocytes Relative: 4 %
Neutro Abs: 2.8 10*3/uL (ref 1.7–7.7)
Neutrophils Relative %: 64 %
Platelets: 171 10*3/uL (ref 150–400)
RBC: 2.75 MIL/uL — ABNORMAL LOW (ref 3.87–5.11)
RDW: 14.6 % (ref 11.5–15.5)
WBC: 4.3 10*3/uL (ref 4.0–10.5)
nRBC: 0 % (ref 0.0–0.2)

## 2020-08-13 LAB — BASIC METABOLIC PANEL
Anion gap: 11 (ref 5–15)
BUN: 31 mg/dL — ABNORMAL HIGH (ref 8–23)
CO2: 23 mmol/L (ref 22–32)
Calcium: 9 mg/dL (ref 8.9–10.3)
Chloride: 103 mmol/L (ref 98–111)
Creatinine, Ser: 1.3 mg/dL — ABNORMAL HIGH (ref 0.44–1.00)
GFR, Estimated: 46 mL/min — ABNORMAL LOW (ref >60)
Glucose, Bld: 228 mg/dL — ABNORMAL HIGH (ref 70–99)
Potassium: 4 mmol/L (ref 3.5–5.1)
Sodium: 137 mmol/L (ref 135–145)

## 2020-08-13 LAB — LACTIC ACID, PLASMA
Lactic Acid, Venous: 2.6 mmol/L (ref 0.5–1.9)
Lactic Acid, Venous: 0.9 mmol/L (ref 0.5–1.9)

## 2020-08-13 LAB — CBG MONITORING, ED: Glucose-Capillary: 220 mg/dL — ABNORMAL HIGH (ref 70–99)

## 2020-08-13 IMAGING — CT CT PELVIS W/ CM
2 of 3 series · 15 of 46 positions shown, 17 images · IV contrast (Omnipaque)
Comparison: None.

CLINICAL DATA: Rectal pain, hemorrhoids for 3 weeks, concern for
rectal abscess

EXAM:
CT PELVIS WITH CONTRAST
TECHNIQUE: Multidetector CT imaging of the pelvis was performed using the
standard protocol following the bolus administration of intravenous
contrast.
CONTRAST:  75mL OMNIPAQUE IOHEXOL 300 MG/ML  SOLN

[Series 2: axial soft · axial · 0.91mm/px · z∈[+767,+1011]mm · 12 of 142 slices shown, 14 images]
[im 10/142  soft-tissue]
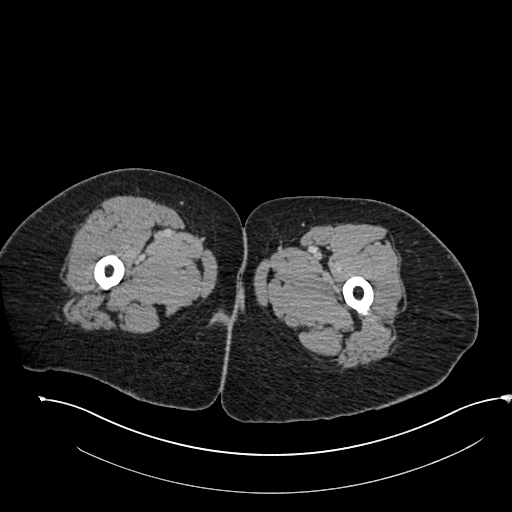
[im 10/142  bone]
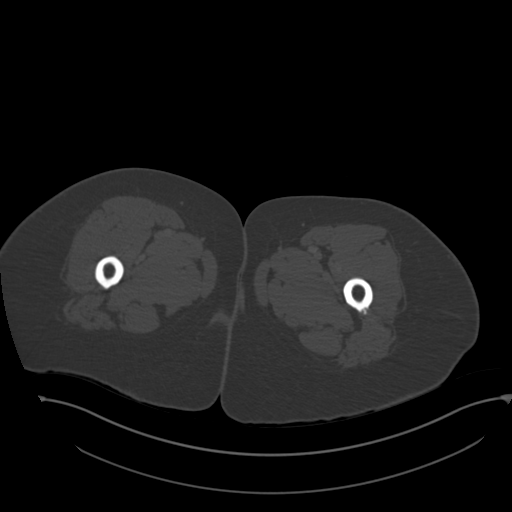
[im 19/142  soft-tissue]
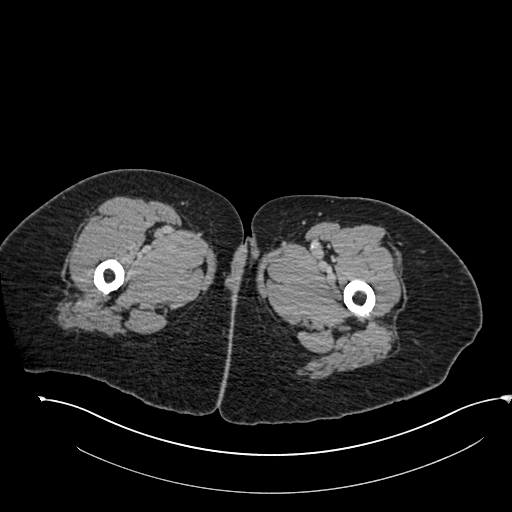
[im 32/142  soft-tissue]
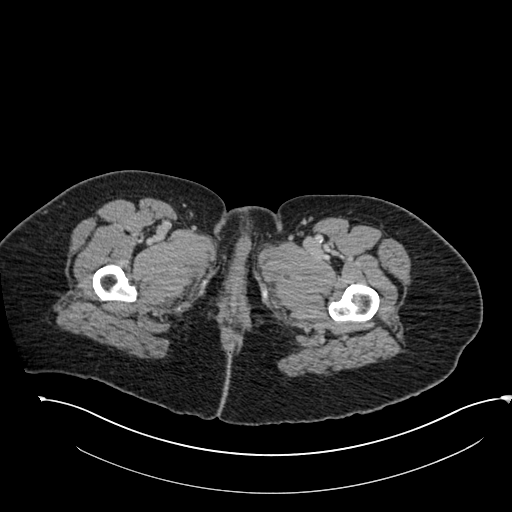
[im 41/142  soft-tissue]
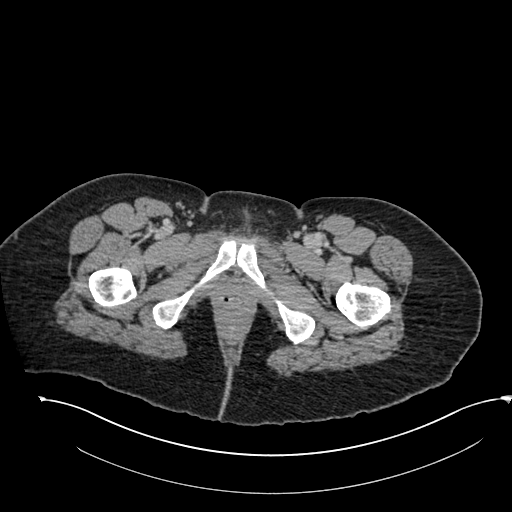
[im 55/142  soft-tissue]
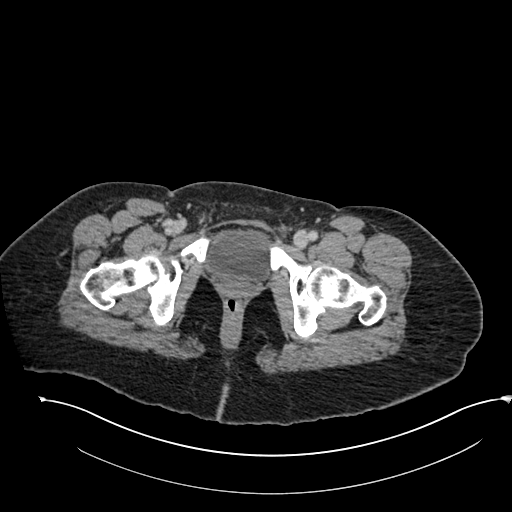
[im 64/142  soft-tissue]
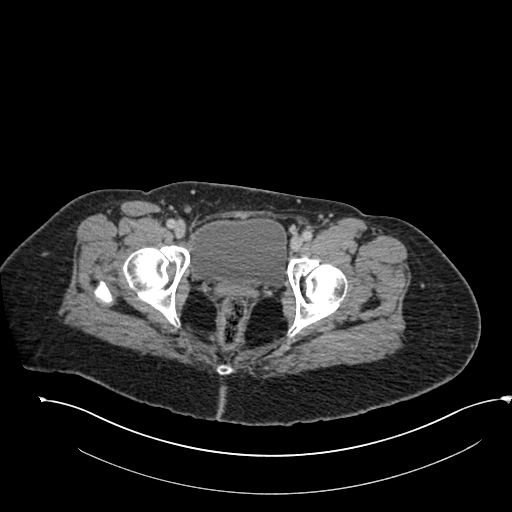
[im 78/142  soft-tissue]
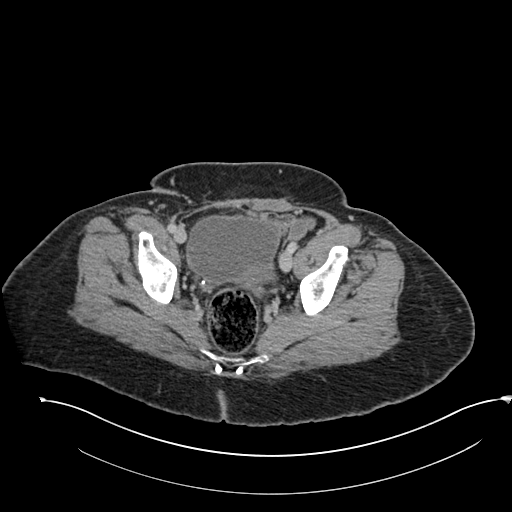
[im 87/142  soft-tissue]
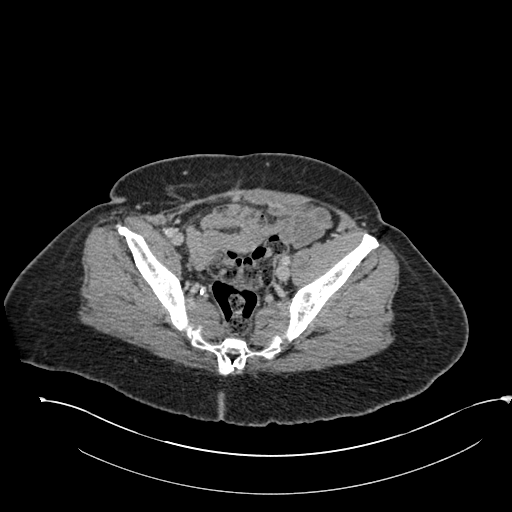
[im 101/142  soft-tissue]
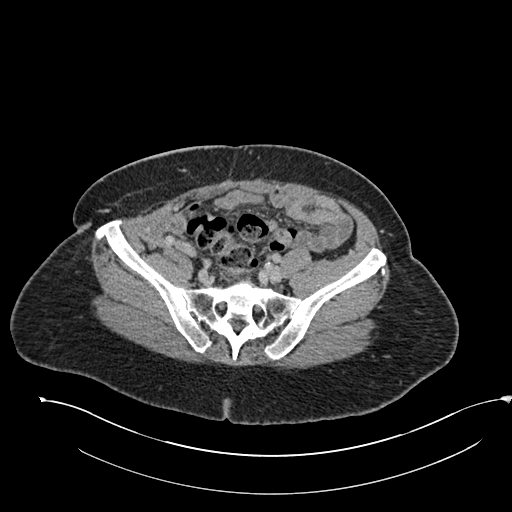
[im 101/142  bone]
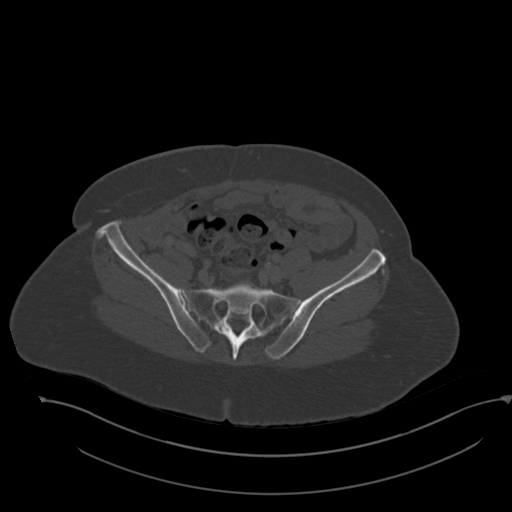
[im 110/142  soft-tissue]
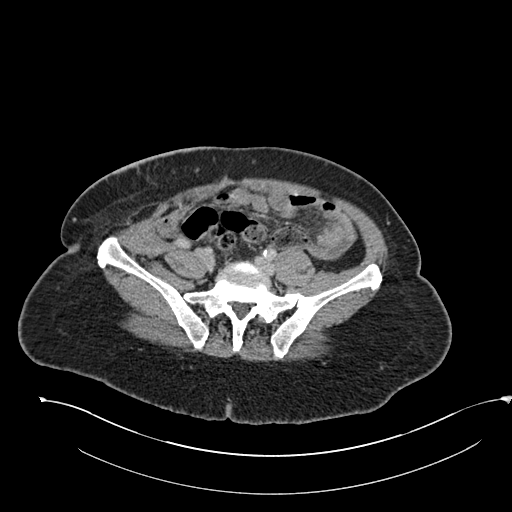
[im 123/142  soft-tissue]
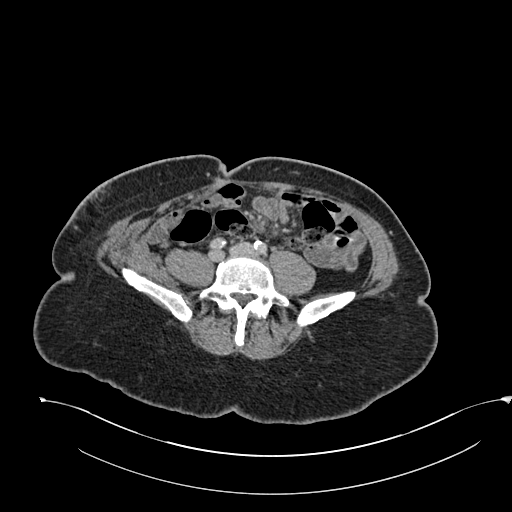
[im 132/142  soft-tissue]
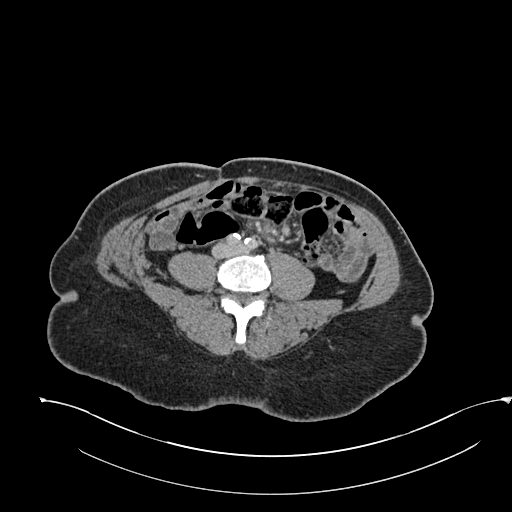

[Series 8: coronal st · coronal · 0.55mm/px · 3 of 190 slices shown]
[im 64/190  soft-tissue]
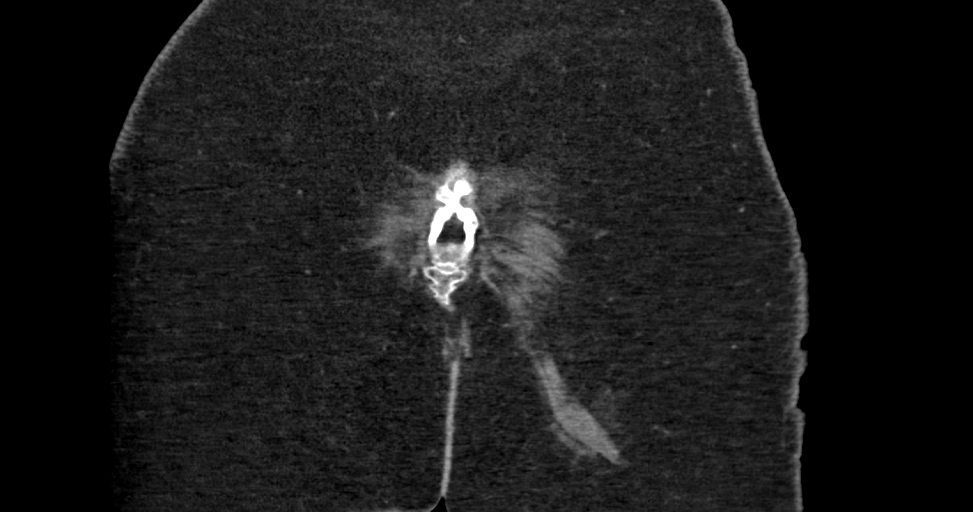
[im 85/190  soft-tissue]
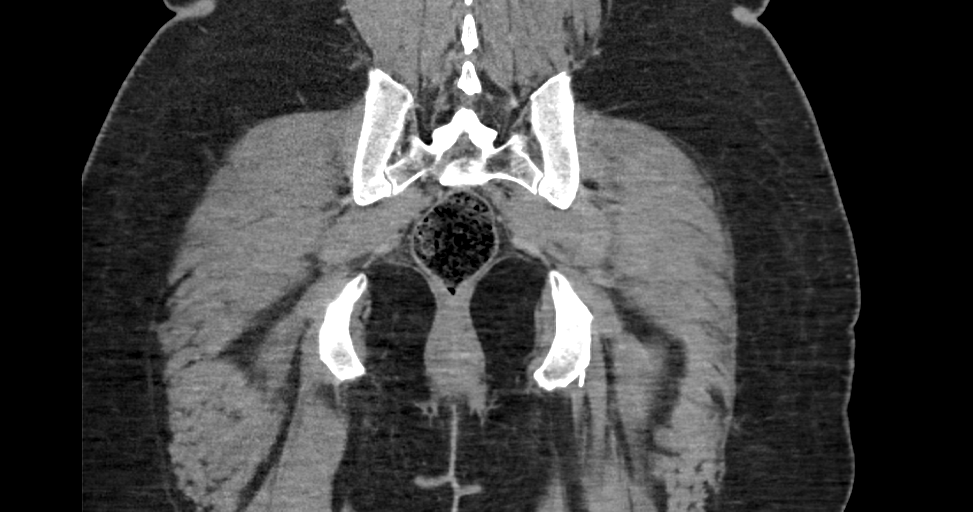
[im 106/190  soft-tissue]
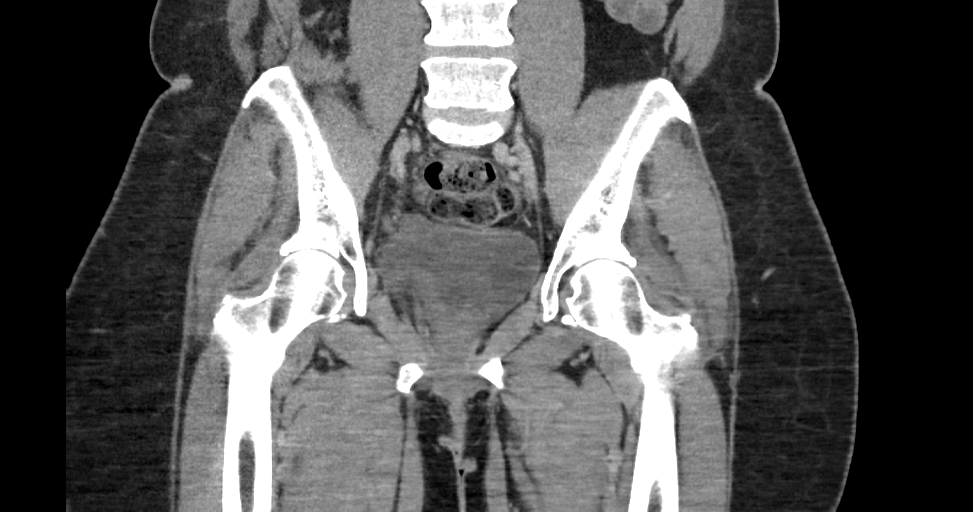

[15 of 46 positions shown; findings below may reference images not displayed]

FINDINGS: Urinary Tract: Distal ureters and bladder are unremarkable. No
urinary tract calculi.

Bowel: No bowel obstruction or ileus. No acute inflammatory changes.

At the anal verge, there is a linear gas lucency along the anterior
margin of the anal rectal junction, which could reflect an anal
fissure. No fluid collection or abscess.

Vascular/Lymphatic: Atherosclerosis of the distal aorta and its
branches. No pathologic adenopathy.

Reproductive:  Uterus is surgically absent.  No adnexal masses.

Other: No free fluid or free intraperitoneal gas. No abdominal wall
hernia.

Musculoskeletal: No acute or destructive bony lesions. Reconstructed
images demonstrate no additional findings.
IMPRESSION: 1. Linear gas lucency arising from the anterior margin of the anal
rectal junction, which could reflect an anal fissure. No fluid
collection or abscess. Please correlate with physical exam findings.
2.  Aortic Atherosclerosis ([C8]-[C8]).

## 2020-08-13 MED ORDER — MORPHINE SULFATE (PF) 4 MG/ML IV SOLN
4.0000 mg | Freq: Once | INTRAVENOUS | Status: DC
  Filled 2020-08-13: qty 1

## 2020-08-13 MED ORDER — ACETAMINOPHEN 325 MG PO TABS
650.0000 mg | ORAL_TABLET | Freq: Once | ORAL | Status: AC
  Administered 2020-08-13: 650 mg via ORAL
  Filled 2020-08-13: qty 2

## 2020-08-13 MED ORDER — IOHEXOL 300 MG/ML  SOLN
75.0000 mL | Freq: Once | INTRAMUSCULAR | Status: AC | PRN
  Administered 2020-08-13: 75 mL via INTRAVENOUS

## 2020-08-13 MED ORDER — NITROGLYCERIN 0.4 % RE OINT: TOPICAL_OINTMENT | RECTAL | 0 refills | Status: DC

## 2020-08-13 MED ORDER — DOCUSATE SODIUM 250 MG PO CAPS: 250.0000 mg | ORAL_CAPSULE | Freq: Every day | ORAL | 0 refills | Status: DC

## 2020-08-13 MED ORDER — LIDOCAINE HCL 2 % EX GEL: 1.0000 "application " | CUTANEOUS | 0 refills | Status: DC | PRN

## 2020-08-13 MED ORDER — SODIUM CHLORIDE 0.9 % IV BOLUS
1000.0000 mL | Freq: Once | INTRAVENOUS | Status: AC
  Administered 2020-08-13: 1000 mL via INTRAVENOUS

## 2020-08-13 NOTE — Discharge Instructions (Addendum)
Please pick up medication and take as prescribed.  Follow up with your PCP tomorrow regarding ED visit  We have also sent out a COVID test given you had a slight fever today. Please stay home and self isolate pending results. We will call you if you test positive.   Return to the ED for any new/worsening symptoms

## 2020-08-13 NOTE — ED Triage Notes (Signed)
Pt c/o hemorrhoids x 3 weeks-NAD-slow gait

## 2020-08-13 NOTE — ED Provider Notes (Signed)
[facility] EMERGENCY DEPARTMENT Provider Note   CSN: [id number] Arrival date & time: 08/13/20  1551     History Chief Complaint  Patient presents with   Hemorrhoids    Diane Willis is a 65 y.o. female with PMHx HTN and Diabetes who presents to the ED today with chief complaint of hemorrhoids.  Patient reports that for the past 3 weeks she has having severe rectal pain secondary to hemorrhoids.  She states that she has been applying Preparation H cream to this area without much relief.  She states she saw her PCP last week for same and was given a prescription however she states that it was too much money and she could not afford it so she never picked it up.  She states that she is here today with worsening pain.  She was unaware she had a fever however when she presented she was febrile at 100.2.  Patient states she is nauseated however denies vomiting.  She denies any abdominal pain.  She states that her stools have been regular for her.  The history is provided by the patient and medical records.      Past Medical History:  Diagnosis Date   COVID-19 04/2012   Diabetes mellitus without complication (HCC)    Hypertension     Patient Active Problem List   Diagnosis Date Noted   Anemia 04/28/2019   Hypertension 04/28/2019   ARF (acute renal failure) (HCC) 04/28/2019   Acute respiratory failure with hypoxia (HCC) 04/28/2019   Acute hypoxemic respiratory failure due to COVID-19 [facility](HCC) 04/27/2019   Bilateral pulmonary embolism (HCC) 04/27/2019    Past Surgical History:  Procedure Laterality Date   ABDOMINAL HYSTERECTOMY       OB History   No obstetric history on file.     Family History  Problem Relation Age of Onset   Healthy Mother    COPD Father    Healthy Sister    HIV Brother    Healthy Sister    HIV Sister    HIV Brother    HIV Brother    HIV Brother    HIV Brother    CAD Neg Hx    Diabetes Mellitus II Neg Hx     Social History   Tobacco  Use   Smoking status: Never   Smokeless tobacco: Never  Vaping Use   Vaping Use: Never used  Substance Use Topics   Alcohol use: No   Drug use: No    Home Medications Prior to Admission medications   Medication Sig Start Date End Date Taking? Authorizing Provider  docusate sodium (COLACE) 250 MG capsule Take 1 capsule (250 mg total) by mouth daily. 08/13/20  Yes Henley Blyth, PA-C  lidocaine (XYLOCAINE) 2 % jelly Apply 1 application topically as needed. 08/13/20  Yes Hyman HopesVenter, Verline Kong, PA-C  Nitroglycerin 0.4 % OINT After cleansing apply twice daily to rectum for 4 weeks 08/13/20  Yes Haydon Dorris, PA-C  ALPRAZolam Prudy Feeler(XANAX) 0.25 MG tablet Take 1 tablet (0.25 mg total) by mouth 3 (three) times daily as needed for anxiety. 04/30/19   Ghimire, Werner LeanShanker M, MD  amLODipine (NORVASC) 10 MG tablet Take 10 mg by mouth every morning. 04/09/19   [provider]  dexamethasone (DECADRON) 6 MG tablet Take 1 tablet (6 mg total) by mouth daily. 04/30/19   Ghimire, Werner LeanShanker M, MD  hydrOXYzine (ATARAX/VISTARIL) 25 MG tablet Take 1 tablet (25 mg total) by mouth every 6 (six) hours as needed for anxiety. 05/03/19  Horton, Mayer Masker, MD  insulin aspart protamine- aspart (NOVOLOG MIX 70/30) (70-30) 100 UNIT/ML injection Inject 0.18-0.35 mLs (18-35 Units total) into the skin See admin instructions. Inject 35 units in the morning and 18 units at night 04/30/19   Ghimire, Werner Lean, MD  metFORMIN (GLUCOPHAGE) 500 MG tablet Take 1 tablet (500 mg total) by mouth 2 (two) times daily. 05/01/19   Ghimire, Werner Lean, MD  pantoprazole (PROTONIX) 40 MG tablet Take 1 tablet (40 mg total) by mouth daily at 12 noon. 04/30/19   Ghimire, Werner Lean, MD  rivaroxaban (XARELTO) 20 MG TABS tablet Take 1 tablet (20 mg total) by mouth daily with supper. 05/21/19   Ghimire, Werner Lean, MD  simvastatin (ZOCOR) 40 MG tablet Take 40 mg by mouth at bedtime. 02/06/19   [provider]    Allergies    Patient has no known  allergies.  Review of Systems   Review of Systems  Constitutional:  Positive for fever.  Gastrointestinal:  Positive for nausea and rectal pain. Negative for abdominal pain and vomiting.  All other systems reviewed and are negative.  Physical Exam Updated Vital Signs BP (!) 147/77   Pulse 78   Temp 99.1 F (37.3 C) (Oral)   Resp 19   Ht 5\' 1"  (1.549 m)   Wt 76.2 kg   SpO2 98%   BMI 31.74 kg/m   Physical Exam Vitals and nursing note reviewed.  Constitutional:      Appearance: She is not ill-appearing.     Comments: Very uncomfortable appearing female  HENT:     Head: Normocephalic and atraumatic.  Eyes:     Conjunctiva/sclera: Conjunctivae normal.  Cardiovascular:     Rate and Rhythm: Normal rate and regular rhythm.     Pulses: Normal pulses.  Pulmonary:     Effort: Pulmonary effort is normal.     Breath sounds: Normal breath sounds. No wheezing, rhonchi or rales.  Abdominal:     Palpations: Abdomen is soft.     Tenderness: There is no abdominal tenderness.  Genitourinary:    Comments: Chaperone present for GU exam.  No external hemorrhoids appreciated on exam today.  Mild erythema around the rectum itself without any fluctuance/induration.  No obvious abscess appreciated on exam.  No fistula or fissures appreciated. Musculoskeletal:     Cervical back: Neck supple.  Skin:    General: Skin is warm and dry.  Neurological:     Mental Status: She is alert.    ED Results / Procedures / Treatments   Labs (all labs ordered are listed, but only abnormal results are displayed) Labs Reviewed  BASIC METABOLIC PANEL - Abnormal; Notable for the following components:      Result Value   Glucose, Bld 228 (*)    BUN 31 (*)    Creatinine, Ser 1.30 (*)    GFR, Estimated 46 (*)    All other components within normal limits  CBC WITH DIFFERENTIAL/PLATELET - Abnormal; Notable for the following components:   RBC 2.75 (*)    Hemoglobin 9.8 (*)    HCT 29.9 (*)    MCV 108.7 (*)     MCH 35.6 (*)    All other components within normal limits  LACTIC ACID, PLASMA - Abnormal; Notable for the following components:   Lactic Acid, Venous 2.6 (*)    All other components within normal limits  CBG MONITORING, ED - Abnormal; Notable for the following components:   Glucose-Capillary 220 (*)    All  other components within normal limits  SARS CORONAVIRUS 2 (TAT 6-24 HRS)  LACTIC ACID, PLASMA    EKG None  Radiology CT PELVIS W CONTRAST  Result Date: 08/13/2020 CLINICAL DATA:  Rectal pain, hemorrhoids for 3 weeks, concern for rectal abscess EXAM: CT PELVIS WITH CONTRAST TECHNIQUE: Multidetector CT imaging of the pelvis was performed using the standard protocol following the bolus administration of intravenous contrast. CONTRAST:  54mL OMNIPAQUE IOHEXOL 300 MG/ML  SOLN COMPARISON:  None. FINDINGS: Urinary Tract: Distal ureters and bladder are unremarkable. No urinary tract calculi. Bowel: No bowel obstruction or ileus. No acute inflammatory changes. At the anal verge, there is a linear gas lucency along the anterior margin of the anal rectal junction, which could reflect an anal fissure. No fluid collection or abscess. Vascular/Lymphatic: Atherosclerosis of the distal aorta and its branches. No pathologic adenopathy. Reproductive:  Uterus is surgically absent.  No adnexal masses. Other: No free fluid or free intraperitoneal gas. No abdominal wall hernia. Musculoskeletal: No acute or destructive bony lesions. Reconstructed images demonstrate no additional findings. IMPRESSION: 1. Linear gas lucency arising from the anterior margin of the anal rectal junction, which could reflect an anal fissure. No fluid collection or abscess. Please correlate with physical exam findings. 2.  Aortic Atherosclerosis (ICD10-I70.0). Electronically Signed   By: Sharlet Salina M.D.   On: 08/13/2020 19:46    Procedures Procedures   Medications Ordered in ED Medications  morphine 4 MG/ML injection 4 mg (4 mg  Intravenous Patient Refused/Not Given 08/13/20 1851)  acetaminophen (TYLENOL) tablet 650 mg (650 mg Oral Given 08/13/20 1831)  sodium chloride 0.9 % bolus 1,000 mL ( Intravenous Stopped 08/13/20 2045)  iohexol (OMNIPAQUE) 300 MG/ML solution 75 mL (75 mLs Intravenous Contrast Given 08/13/20 1929)    ED Course  I have reviewed the triage vital signs and the nursing notes.  Pertinent labs & imaging results that were available during my care of the patient were reviewed by me and considered in my medical decision making (see chart for details).  Clinical Course as of 08/13/20 2215  Mon Aug 13, 2020  1927 Lactic Acid, Venous(!!): 2.6 [MV]    Clinical Course User Index [MV] Tanda Rockers, [place]   MDM Rules/Calculators/A&P                           65 year old female who presents to the ED today with chief complaint of hemorrhoids however when she came to the ED she was found to be febrile at 100.2.  Remainder of vitals unremarkable.  On exam she is a very uncomfortable appearing female, sitting at the edge of the bed due to rectal pain.  Chaperone present for exam, no obvious findings on rectal exam today.  She has some erythema noted however no obvious fluctuance/induration/abscess.  There are no external hemorrhoids appreciated whatsoever.  Given history of diabetes concern for possible perirectal abscess that is not being visualized with the naked eye.  We will plan for CT pelvis.  Will obtain labs including CBC, BMP, lactic acid.  CBC without leukocytosis today.  Hemoglobin stable at 9.8. BMP with a creatinine of 1.30, GFR 46.  Glucose 228, bicarb within normal notes at 23.  No gap. Lactic acid elevated at 2.6.  Fluids running at this time.  CT: IMPRESSION:  1. Linear gas lucency arising from the anterior margin of the anal  rectal junction, which could reflect an anal fissure. No fluid  collection or abscess. Please correlate  with physical exam findings.  2.  Aortic Atherosclerosis  (ICD10-I70.0).   Repeat lactic acid after fluids 0.9  Will discharge home with treatment for anal fissure. Question if her fever was incidental. No leukocytosis and otherwise well appearing without specific symptoms. Will plan for COVID test at this time however do not feel the fever needs to be pursued further.   Final Clinical Impression(s) / ED Diagnoses Final diagnoses:  Anal fissure    Rx / DC Orders ED Discharge Orders          Ordered    lidocaine (XYLOCAINE) 2 % jelly  As needed        08/13/20 2214    docusate sodium (COLACE) 250 MG capsule  Daily        08/13/20 2214    Nitroglycerin 0.4 % OINT        08/13/20 2214             Discharge Instructions      Please pick up medication and take as prescribed.  Follow up with your PCP tomorrow regarding ED visit  We have also sent out a COVID test given you had a slight fever today. Please stay home and self isolate pending results. We will call you if you test positive.   Return to the ED for any new/worsening symptoms       Tanda Rockers, Cordelia Poche 08/13/20 2215    Vanetta Mulders, MD 08/16/20 1258

## 2020-08-13 NOTE — ED Notes (Signed)
Pt provided discharge instructions and prescription information. Pt was given the opportunity to ask questions and questions were answered. Discharge signature not obtained in the setting of the COVID-19 pandemic in order to reduce high touch surfaces.  ° °

## 2020-08-14 LAB — SARS CORONAVIRUS 2 (TAT 6-24 HRS): SARS Coronavirus 2: NEGATIVE

## 2020-08-18 ENCOUNTER — Emergency Department (HOSPITAL_BASED_OUTPATIENT_CLINIC_OR_DEPARTMENT_OTHER)
Admission: EM | Admit: 2020-08-18 | Discharge: 2020-08-18 | Disposition: A | Discharge status: Home / Self Care | Payer: BC Managed Care – PPO | Attending: Emergency Medicine | Admitting: Emergency Medicine

## 2020-08-18 ENCOUNTER — Emergency Department (HOSPITAL_BASED_OUTPATIENT_CLINIC_OR_DEPARTMENT_OTHER): Payer: BC Managed Care – PPO

## 2020-08-18 ENCOUNTER — Encounter (HOSPITAL_BASED_OUTPATIENT_CLINIC_OR_DEPARTMENT_OTHER): Payer: Self-pay

## 2020-08-18 ENCOUNTER — Other Ambulatory Visit: Payer: Self-pay

## 2020-08-18 DIAGNOSIS — K5900 Constipation, unspecified: Secondary | ICD-10-CM

## 2020-08-18 DIAGNOSIS — Z7984 Long term (current) use of oral hypoglycemic drugs: Secondary | ICD-10-CM | POA: Insufficient documentation

## 2020-08-18 DIAGNOSIS — Z794 Long term (current) use of insulin: Secondary | ICD-10-CM | POA: Insufficient documentation

## 2020-08-18 DIAGNOSIS — K6289 Other specified diseases of anus and rectum: Secondary | ICD-10-CM | POA: Diagnosis present

## 2020-08-18 DIAGNOSIS — Z79899 Other long term (current) drug therapy: Secondary | ICD-10-CM | POA: Insufficient documentation

## 2020-08-18 DIAGNOSIS — E119 Type 2 diabetes mellitus without complications: Secondary | ICD-10-CM | POA: Diagnosis not present

## 2020-08-18 DIAGNOSIS — Z7901 Long term (current) use of anticoagulants: Secondary | ICD-10-CM | POA: Insufficient documentation

## 2020-08-18 DIAGNOSIS — Z8616 Personal history of COVID-19: Secondary | ICD-10-CM | POA: Diagnosis not present

## 2020-08-18 DIAGNOSIS — D696 Thrombocytopenia, unspecified: Secondary | ICD-10-CM

## 2020-08-18 DIAGNOSIS — I10 Essential (primary) hypertension: Secondary | ICD-10-CM | POA: Diagnosis not present

## 2020-08-18 LAB — CBC WITH DIFFERENTIAL/PLATELET
Abs Immature Granulocytes: 0.03 10*3/uL (ref 0.00–0.07)
Basophils Absolute: 0 10*3/uL (ref 0.0–0.1)
Basophils Relative: 0 %
Eosinophils Absolute: 0 10*3/uL (ref 0.0–0.5)
Eosinophils Relative: 1 %
HCT: 28.5 % — ABNORMAL LOW (ref 36.0–46.0)
Hemoglobin: 9.3 g/dL — ABNORMAL LOW (ref 12.0–15.0)
Immature Granulocytes: 1 %
Lymphocytes Relative: 34 %
Lymphs Abs: 1.5 10*3/uL (ref 0.7–4.0)
MCH: 35.5 pg — ABNORMAL HIGH (ref 26.0–34.0)
MCHC: 32.6 g/dL (ref 30.0–36.0)
MCV: 108.8 fL — ABNORMAL HIGH (ref 80.0–100.0)
Monocytes Absolute: 0.1 10*3/uL (ref 0.1–1.0)
Monocytes Relative: 3 %
Neutro Abs: 2.7 10*3/uL (ref 1.7–7.7)
Neutrophils Relative %: 61 %
Platelets: 149 10*3/uL — ABNORMAL LOW (ref 150–400)
RBC: 2.62 MIL/uL — ABNORMAL LOW (ref 3.87–5.11)
RDW: 14.9 % (ref 11.5–15.5)
WBC: 4.3 10*3/uL (ref 4.0–10.5)
nRBC: 0 % (ref 0.0–0.2)

## 2020-08-18 LAB — BASIC METABOLIC PANEL
Anion gap: 10 (ref 5–15)
BUN: 38 mg/dL — ABNORMAL HIGH (ref 8–23)
CO2: 25 mmol/L (ref 22–32)
Calcium: 9.5 mg/dL (ref 8.9–10.3)
Chloride: 106 mmol/L (ref 98–111)
Creatinine, Ser: 1.53 mg/dL — ABNORMAL HIGH (ref 0.44–1.00)
GFR, Estimated: 38 mL/min — ABNORMAL LOW (ref >60)
Glucose, Bld: 144 mg/dL — ABNORMAL HIGH (ref 70–99)
Potassium: 4.6 mmol/L (ref 3.5–5.1)
Sodium: 141 mmol/L (ref 135–145)

## 2020-08-18 IMAGING — CR DG ABDOMEN 1V
1 series · 1 of 1 positions shown · non-contrast
Comparison: None.

CLINICAL DATA: Abdominal pain and constipation.

EXAM:
ABDOMEN - 1 VIEW

[t abdomen supine]
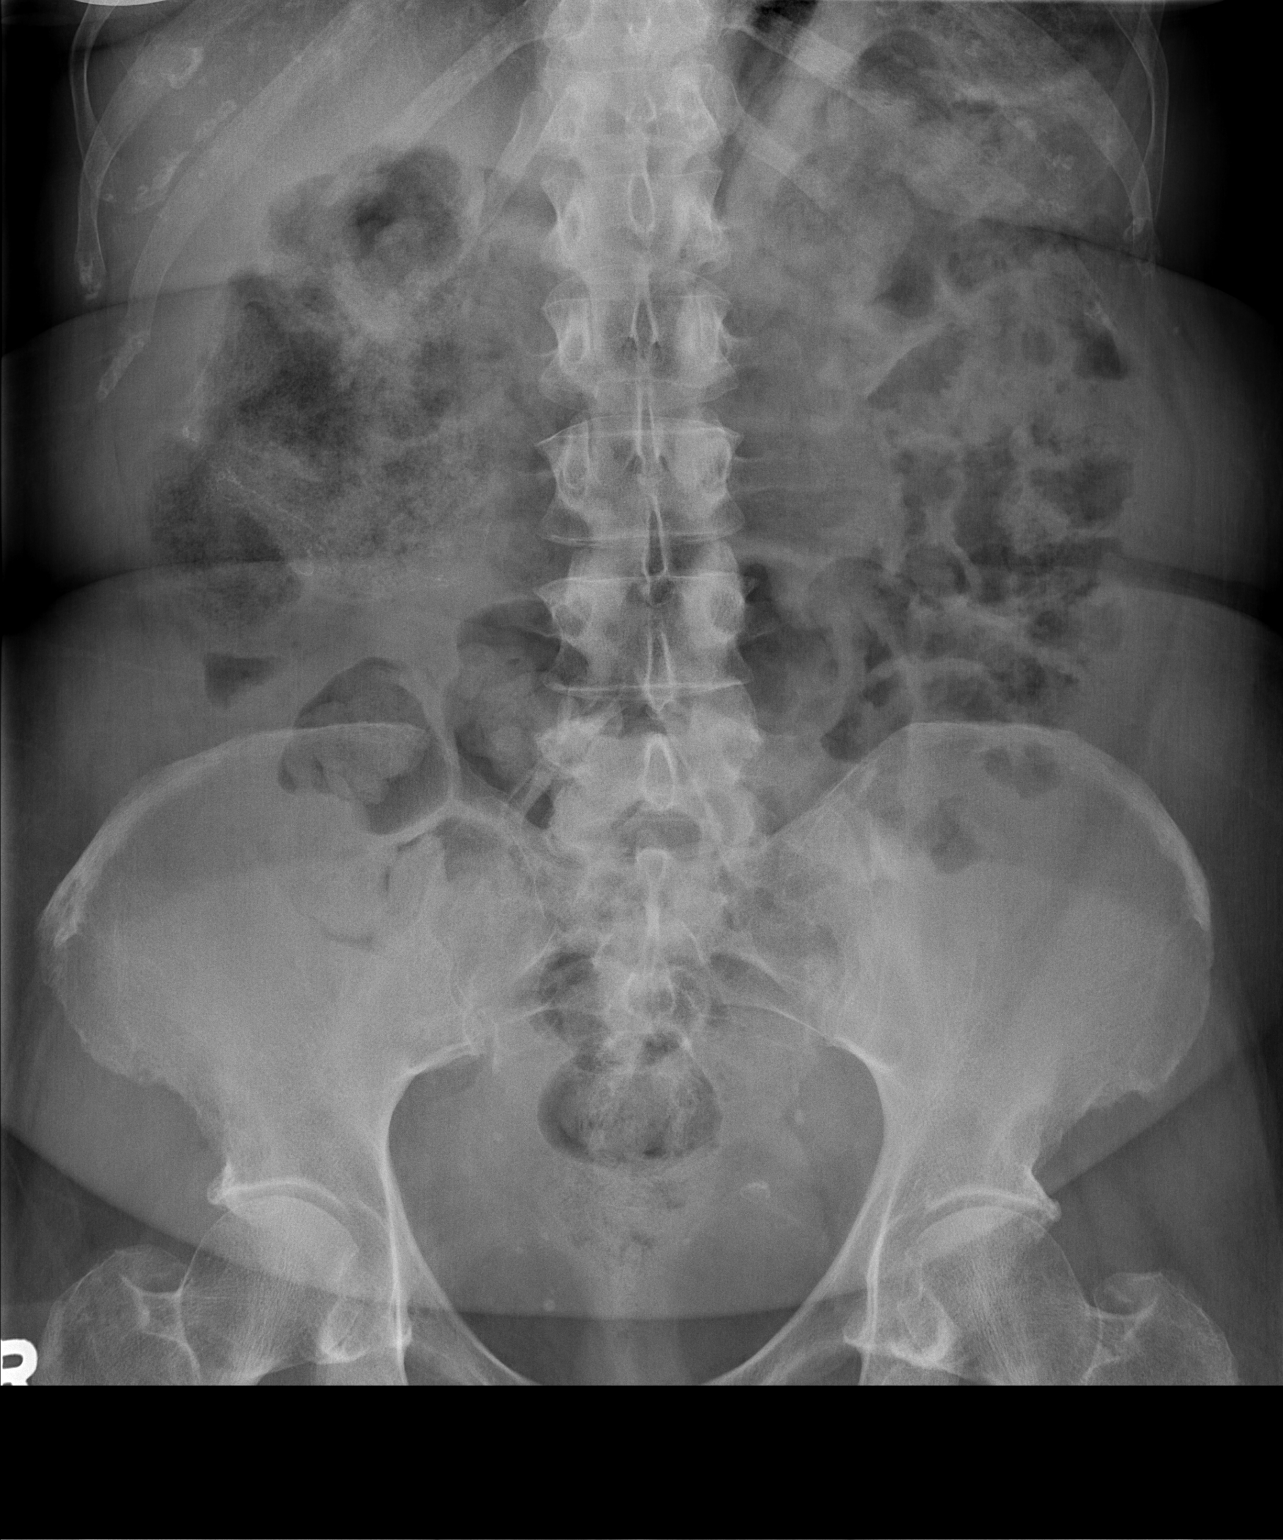

[1 of 1 positions shown; findings below may reference images not displayed]

FINDINGS: Moderate stool throughout the colon is noted.

No dilated bowel loops are noted.

No suspicious calcifications are present.

No acute bony abnormalities are identified.
IMPRESSION: Moderate colonic stool. No evidence of bowel obstruction.

## 2020-08-18 MED ORDER — HYDROCORTISONE (PERIANAL) 2.5 % EX CREA: 1.0000 "application " | TOPICAL_CREAM | Freq: Two times a day (BID) | CUTANEOUS | 0 refills | Status: DC

## 2020-08-18 MED ORDER — ASPERCREME/ALOE 10 % EX CREA: 1.0000 "application " | TOPICAL_CREAM | CUTANEOUS | 0 refills | Status: DC | PRN

## 2020-08-18 NOTE — ED Provider Notes (Addendum)
[facility] EMERGENCY DEPARTMENT Provider Note   CSN: [id number] Arrival date & time: 08/18/20  1242     History Chief Complaint  Patient presents with   Hemorrhoids    Diane Willis is a 65 y.o. female.  HPI  Patient presents with rectal pain x1 week.  The pain is constant, worse with sitting or defecation.  There is no associated bright red blood per rectum, has not noticed any discharge or fluctuance in that area.  She has tried Tylenol without relief, is also tried applying Preparation H without any relief.  She was seen on 08/13/2020 for the same complaint, during that work-up a CT abdomen was done which showed signs of possible anal fissure, although it was not conclusive and advised to correlate with physical exam findings.  She was prescribed lidocaine, nitroglycerin cream, and docusate.  Her insurance would not cover any of them so she has been unable to get the medicine.  States the pain is continued since then, but she has not had a bowel movement in 1 week.  She has been passing gas, history of hysterectomy but no other abdominal surgeries.  She is not having any dysuria, fevers, chills, bloody stools, abdominal pain, hematuria.  Unsure when her last colonoscopy was done.  Past Medical History:  Diagnosis Date   COVID-19 04/2012   Diabetes mellitus without complication (HCC)    Hypertension     Patient Active Problem List   Diagnosis Date Noted   Anemia 04/28/2019   Hypertension 04/28/2019   ARF (acute renal failure) (HCC) 04/28/2019   Acute respiratory failure with hypoxia (HCC) 04/28/2019   Acute hypoxemic respiratory failure due to COVID-19 [facility](HCC) 04/27/2019   Bilateral pulmonary embolism (HCC) 04/27/2019    Past Surgical History:  Procedure Laterality Date   ABDOMINAL HYSTERECTOMY       OB History   No obstetric history on file.     Family History  Problem Relation Age of Onset   Healthy Mother    COPD Father    Healthy Sister    HIV Brother     Healthy Sister    HIV Sister    HIV Brother    HIV Brother    HIV Brother    HIV Brother    CAD Neg Hx    Diabetes Mellitus II Neg Hx     Social History   Tobacco Use   Smoking status: Never   Smokeless tobacco: Never  Vaping Use   Vaping Use: Never used  Substance Use Topics   Alcohol use: No   Drug use: No    Home Medications Prior to Admission medications   Medication Sig Start Date End Date Taking? Authorizing Provider  ALPRAZolam (XANAX) 0.25 MG tablet Take 1 tablet (0.25 mg total) by mouth 3 (three) times daily as needed for anxiety. 04/30/19   Ghimire, Werner LeanShanker M, MD  amLODipine (NORVASC) 10 MG tablet Take 10 mg by mouth every morning. 04/09/19   [provider]  dexamethasone (DECADRON) 6 MG tablet Take 1 tablet (6 mg total) by mouth daily. 04/30/19   Ghimire, Werner LeanShanker M, MD  docusate sodium (COLACE) 250 MG capsule Take 1 capsule (250 mg total) by mouth daily. 08/13/20   Hyman HopesVenter, Margaux, PA-C  hydrOXYzine (ATARAX/VISTARIL) 25 MG tablet Take 1 tablet (25 mg total) by mouth every 6 (six) hours as needed for anxiety. 05/03/19   Horton, Mayer Maskerourtney F, MD  insulin aspart protamine- aspart (NOVOLOG MIX 70/30) (70-30) 100 UNIT/ML injection Inject 0.18-0.35  mLs (18-35 Units total) into the skin See admin instructions. Inject 35 units in the morning and 18 units at night 04/30/19   Ghimire, Werner Lean, MD  lidocaine (XYLOCAINE) 2 % jelly Apply 1 application topically as needed. 08/13/20   Hyman Hopes, Margaux, PA-C  metFORMIN (GLUCOPHAGE) 500 MG tablet Take 1 tablet (500 mg total) by mouth 2 (two) times daily. 05/01/19   Ghimire, Werner Lean, MD  Nitroglycerin 0.4 % OINT After cleansing apply twice daily to rectum for 4 weeks 08/13/20   Tanda Rockers, PA-C  pantoprazole (PROTONIX) 40 MG tablet Take 1 tablet (40 mg total) by mouth daily at 12 noon. 04/30/19   Ghimire, Werner Lean, MD  rivaroxaban (XARELTO) 20 MG TABS tablet Take 1 tablet (20 mg total) by mouth daily with supper. 05/21/19   Ghimire,  Werner Lean, MD  simvastatin (ZOCOR) 40 MG tablet Take 40 mg by mouth at bedtime. 02/06/19   [provider]    Allergies    Patient has no known allergies.  Review of Systems   Review of Systems  Constitutional:  Negative for chills and fever.  HENT:  Negative for ear pain and sore throat.   Eyes:  Negative for pain and visual disturbance.  Respiratory:  Negative for cough and shortness of breath.   Cardiovascular:  Negative for chest pain and palpitations.  Gastrointestinal:  Positive for constipation and rectal pain. Negative for abdominal pain, anal bleeding, blood in stool, diarrhea, nausea and vomiting.  Genitourinary:  Negative for dysuria, hematuria and pelvic pain.  Musculoskeletal:  Negative for arthralgias and back pain.  Skin:  Negative for color change and rash.  Neurological:  Negative for seizures and syncope.  All other systems reviewed and are negative.  Physical Exam Updated Vital Signs BP (!) 152/62 (BP Location: Left Arm)   Pulse 82   Temp 99.2 F (37.3 C) (Oral)   Resp 18   Ht 5\' 1"  (1.549 m)   Wt 76.2 kg   SpO2 100%   BMI 31.74 kg/m   Physical Exam Vitals and nursing note reviewed. Exam conducted with a chaperone present.  Constitutional:      General: She is not in acute distress.    Appearance: Normal appearance.     Comments: Well-appearing, but sitting awkwardly to remove pressure from rectum.  HENT:     Head: Normocephalic and atraumatic.  Eyes:     General: No scleral icterus.    Extraocular Movements: Extraocular movements intact.     Pupils: Pupils are equal, round, and reactive to light.  Cardiovascular:     Rate and Rhythm: Normal rate and regular rhythm.  Abdominal:     General: Abdomen is flat. A surgical scar is present. Bowel sounds are normal.     Tenderness: There is abdominal tenderness in the suprapubic area. There is no guarding or rebound.     Comments: Surgical scar from hysterectomy, intact without dehiscence.  Some  mild abdominal tenderness to palpation over the suprapubic area.  Bowel sounds are present in all 4 quadrants of the abdomen.  Abdomen is soft, no rebound or guarding.   Genitourinary:    Rectum: Tenderness present.     Comments: No external hemorrhoids visualized, no areas of fluctuance concerning for abscess.  No skin color changes, no warmth.  I do not see a fissure. Skin:    Coloration: Skin is not jaundiced.  Neurological:     Mental Status: She is alert. Mental status is at baseline.  Coordination: Coordination normal.    ED Results / Procedures / Treatments   Labs (all labs ordered are listed, but only abnormal results are displayed) Labs Reviewed  BASIC METABOLIC PANEL  CBC WITH DIFFERENTIAL/PLATELET    EKG None  Radiology No results found.  Procedures Procedures   Medications Ordered in ED Medications - No data to display  ED Course  I have reviewed the triage vital signs and the nursing notes.  Pertinent labs & imaging results that were available during my care of the patient were reviewed by me and considered in my medical decision making (see chart for details).  Clinical Course as of 08/18/20 1622  Sat Aug 18, 2020  1505 DG Abdomen 1 View Moderate colonic stool, no evidence of obstruction.  This is consistent with her physical exam and history. [HS]  1505 CBC with Differential(!) No leukocytosis, patient is anemic but stable.  Appears to be at baseline.  She does have thrombocytopenia. [HS]    Clinical Course User Index [HS] Theron Arista, PA-C   MDM Rules/Calculators/A&P                           Patient vitals are stable, although she is mildly hypertensive.  She has a history of diabetes, will check basic labs due to her constipation as well to make sure there is no electrolyte abnormalities or signs of anemia.  She has not had any rectal bleeding, no hemorrhoids on exam.  I also do not see signs of an anal fissure although there was a possible  finding suggestive of that on the CT scan done on 08/13/2020, I was not able to correlate that finding with my physical exam.    Do not see any signs of a perirectal abscess on PE. She also just had a CT abdomen pelvis done so I do not think we need to repeat that today.  Patient is constipation x 1 week, but she is passing gas and small amounts of stool. Unclear etiology for the constipation, she denies any recent medication or diet changes.  She has not tried any alleviating factors like stool softeners or laxative.  She has bowel sounds in all 4 quadrants, considered small bowel obstruction I think that is less likely.  However I will order an abdominal plain film to evaluate for possible signs of an SBO as well as to see the level of constipation.  There were no peritoneal signs, doubt surgical abdomen.  Evidence of constipation on radiograph, no signs of a bowel obstruction.  Given she has not tried any alleviating factors I will have her try MiraLAX and bowel routine.  Advised her that if she does not have a full bowel movement in the next 48 hours she needs to return back to the ED for likely fecal disimpaction.  Advised that she can also try doing an enema at home first.  Discussed that I do not have obvious source of her rectal pain.  Although there is some suspicion of possible anal fissure, I am unable to confirm that with my physical exam.  I prescribed her alternative medications to try picking up for insurance coverage, I have also discussed the option of sitz bath and of good Rx to help reduce the cost.  Advised her to try the medicine as prescribed to see if that offers improvement.  Discussed the findings of anemia which appears to be chronic for the last 5 years with the patient.  She was unaware of this finding and is unsure if she is ever discussed with her primary care physician.  She also has some thrombocytopenia, per chart review this has been declining over the past year.  While  these findings combined her not emergent, I discussed with her that it is very important to follow-up with her primary care doctor for further evaluation.  She verbalized understanding and agreement.  Patient is stable, nontoxic-appearing.  She is appropriate for discharge at this time.  Return precautions discussed and agreed upon.  Final Clinical Impression(s) / ED Diagnoses Final diagnoses:  None    Rx / DC Orders ED Discharge Orders     None        Theron Arista, PA-C 08/18/20 1622    Theron Arista, PA-C 08/18/20 1622    Pollyann Savoy, MD 08/19/20 [phone]

## 2020-08-18 NOTE — Discharge Instructions (Addendum)
During the work-up we saw that you are anemic, this means your red blood cell counts are low.  You have had low red blood count for the last 5 years, and although this is not life-threatening and is something it needs further work-up and evaluation.  Additionally, your platelets were low.  They have been decreasing for the last year according to your chart review.  Please discuss both of these abnormal findings with your primary care doctor this week so that they are aware that they may address further evaluation if warranted.  Please pick up MiraLAX at the store.  This is a laxative, you can use different amounts depending on what you need.  Tonight I would recommend trying 1 spoonful mixed with some water or juice.  If you do not have a bowel movement do 1 more spoonful.  This should help relieve you of the constipation.  Do this again tomorrow morning if needed.  You can start with half a spoonful every morning with your drinks to stimulate the bowels.  If you are unable to relieve yourself in the next 48 hours need to return back to the ER for fecal disimpaction.  As we discussed, there is some question of whether you have an anal fissure.  I do not see one on my exam, the CT scan from last week was inconclusive.  That said, please pick up one of the medications and see if that helps alleviate your rectal pain.  You may also try an Epson salt bath multiple times a day as needed, sometimes this can help alleviate pain.  Additionally, if none of the medications prescribed or covered by insurance please look at good Rx.  Sometimes can get discounts with these coupons and save money to make the medicine more affordable. [email or web address]

## 2020-08-18 NOTE — ED Triage Notes (Signed)
Pt states was prescribed medications when she was seen here on 8/1, has been unable to pick them up. States hasnt been able to follow up. States hemorrhoids are still bothering her.

## 2020-08-30 ENCOUNTER — Emergency Department (HOSPITAL_COMMUNITY): Payer: BC Managed Care – PPO

## 2020-08-30 ENCOUNTER — Inpatient Hospital Stay (HOSPITAL_BASED_OUTPATIENT_CLINIC_OR_DEPARTMENT_OTHER)
Admission: EM | Admit: 2020-08-30 | Discharge: 2020-09-06 | DRG: 811 | Disposition: A | Discharge status: Rehabilitation Facility | Payer: BC Managed Care – PPO | Attending: Internal Medicine | Admitting: Internal Medicine

## 2020-08-30 ENCOUNTER — Emergency Department (HOSPITAL_BASED_OUTPATIENT_CLINIC_OR_DEPARTMENT_OTHER): Payer: BC Managed Care – PPO

## 2020-08-30 ENCOUNTER — Encounter (HOSPITAL_BASED_OUTPATIENT_CLINIC_OR_DEPARTMENT_OTHER): Payer: Self-pay | Admitting: Urology

## 2020-08-30 DIAGNOSIS — N182 Chronic kidney disease, stage 2 (mild): Secondary | ICD-10-CM | POA: Diagnosis not present

## 2020-08-30 DIAGNOSIS — R532 Functional quadriplegia: Secondary | ICD-10-CM | POA: Diagnosis not present

## 2020-08-30 DIAGNOSIS — D649 Anemia, unspecified: Secondary | ICD-10-CM | POA: Diagnosis present

## 2020-08-30 DIAGNOSIS — Z825 Family history of asthma and other chronic lower respiratory diseases: Secondary | ICD-10-CM | POA: Diagnosis not present

## 2020-08-30 DIAGNOSIS — Z79899 Other long term (current) drug therapy: Secondary | ICD-10-CM

## 2020-08-30 DIAGNOSIS — Z7984 Long term (current) use of oral hypoglycemic drugs: Secondary | ICD-10-CM

## 2020-08-30 DIAGNOSIS — Z7982 Long term (current) use of aspirin: Secondary | ICD-10-CM | POA: Diagnosis not present

## 2020-08-30 DIAGNOSIS — N179 Acute kidney failure, unspecified: Secondary | ICD-10-CM | POA: Diagnosis not present

## 2020-08-30 DIAGNOSIS — F419 Anxiety disorder, unspecified: Secondary | ICD-10-CM | POA: Diagnosis not present

## 2020-08-30 DIAGNOSIS — Z7901 Long term (current) use of anticoagulants: Secondary | ICD-10-CM | POA: Diagnosis not present

## 2020-08-30 DIAGNOSIS — E519 Thiamine deficiency, unspecified: Secondary | ICD-10-CM | POA: Diagnosis present

## 2020-08-30 DIAGNOSIS — E785 Hyperlipidemia, unspecified: Secondary | ICD-10-CM | POA: Diagnosis present

## 2020-08-30 DIAGNOSIS — G950 Syringomyelia and syringobulbia: Secondary | ICD-10-CM | POA: Diagnosis present

## 2020-08-30 DIAGNOSIS — E1122 Type 2 diabetes mellitus with diabetic chronic kidney disease: Secondary | ICD-10-CM | POA: Diagnosis present

## 2020-08-30 DIAGNOSIS — I129 Hypertensive chronic kidney disease with stage 1 through stage 4 chronic kidney disease, or unspecified chronic kidney disease: Secondary | ICD-10-CM | POA: Diagnosis not present

## 2020-08-30 DIAGNOSIS — E538 Deficiency of other specified B group vitamins: Secondary | ICD-10-CM | POA: Diagnosis present

## 2020-08-30 DIAGNOSIS — G320 Subacute combined degeneration of spinal cord in diseases classified elsewhere: Secondary | ICD-10-CM | POA: Diagnosis not present

## 2020-08-30 DIAGNOSIS — E1165 Type 2 diabetes mellitus with hyperglycemia: Secondary | ICD-10-CM | POA: Diagnosis present

## 2020-08-30 DIAGNOSIS — R270 Ataxia, unspecified: Secondary | ICD-10-CM | POA: Diagnosis not present

## 2020-08-30 DIAGNOSIS — Z8616 Personal history of COVID-19: Secondary | ICD-10-CM

## 2020-08-30 DIAGNOSIS — G378 Other specified demyelinating diseases of central nervous system: Secondary | ICD-10-CM | POA: Diagnosis present

## 2020-08-30 DIAGNOSIS — Z20822 Contact with and (suspected) exposure to covid-19: Secondary | ICD-10-CM | POA: Diagnosis present

## 2020-08-30 DIAGNOSIS — R29898 Other symptoms and signs involving the musculoskeletal system: Secondary | ICD-10-CM

## 2020-08-30 DIAGNOSIS — R200 Anesthesia of skin: Secondary | ICD-10-CM | POA: Diagnosis not present

## 2020-08-30 DIAGNOSIS — Z86711 Personal history of pulmonary embolism: Secondary | ICD-10-CM | POA: Diagnosis not present

## 2020-08-30 DIAGNOSIS — D6959 Other secondary thrombocytopenia: Secondary | ICD-10-CM | POA: Diagnosis present

## 2020-08-30 DIAGNOSIS — Z90710 Acquired absence of both cervix and uterus: Secondary | ICD-10-CM | POA: Diagnosis not present

## 2020-08-30 DIAGNOSIS — Z794 Long term (current) use of insulin: Secondary | ICD-10-CM | POA: Diagnosis not present

## 2020-08-30 DIAGNOSIS — I10 Essential (primary) hypertension: Secondary | ICD-10-CM | POA: Diagnosis present

## 2020-08-30 DIAGNOSIS — D510 Vitamin B12 deficiency anemia due to intrinsic factor deficiency: Principal | ICD-10-CM | POA: Diagnosis present

## 2020-08-30 HISTORY — DX: Other pulmonary embolism without acute cor pulmonale: I26.99

## 2020-08-30 LAB — COMPREHENSIVE METABOLIC PANEL
ALT: 31 U/L (ref 0–44)
AST: 25 U/L (ref 15–41)
Albumin: 4.3 g/dL (ref 3.5–5.0)
Alkaline Phosphatase: 66 U/L (ref 38–126)
Anion gap: 11 (ref 5–15)
BUN: 30 mg/dL — ABNORMAL HIGH (ref 8–23)
CO2: 25 mmol/L (ref 22–32)
Calcium: 9.6 mg/dL (ref 8.9–10.3)
Chloride: 104 mmol/L (ref 98–111)
Creatinine, Ser: 1.14 mg/dL — ABNORMAL HIGH (ref 0.44–1.00)
GFR, Estimated: 54 mL/min — ABNORMAL LOW (ref >60)
Glucose, Bld: 135 mg/dL — ABNORMAL HIGH (ref 70–99)
Potassium: 3.4 mmol/L — ABNORMAL LOW (ref 3.5–5.1)
Sodium: 140 mmol/L (ref 135–145)
Total Bilirubin: 1 mg/dL (ref 0.3–1.2)
Total Protein: 7.2 g/dL (ref 6.5–8.1)

## 2020-08-30 LAB — CBC WITH DIFFERENTIAL/PLATELET
Abs Immature Granulocytes: 0.03 10*3/uL (ref 0.00–0.07)
Basophils Absolute: 0 10*3/uL (ref 0.0–0.1)
Basophils Relative: 0 %
Eosinophils Absolute: 0.1 10*3/uL (ref 0.0–0.5)
Eosinophils Relative: 2 %
HCT: 28.3 % — ABNORMAL LOW (ref 36.0–46.0)
Hemoglobin: 9.7 g/dL — ABNORMAL LOW (ref 12.0–15.0)
Immature Granulocytes: 1 %
Lymphocytes Relative: 29 %
Lymphs Abs: 1.1 10*3/uL (ref 0.7–4.0)
MCH: 36.2 pg — ABNORMAL HIGH (ref 26.0–34.0)
MCHC: 34.3 g/dL (ref 30.0–36.0)
MCV: 105.6 fL — ABNORMAL HIGH (ref 80.0–100.0)
Monocytes Absolute: 0.1 10*3/uL (ref 0.1–1.0)
Monocytes Relative: 2 %
Neutro Abs: 2.4 10*3/uL (ref 1.7–7.7)
Neutrophils Relative %: 66 %
Platelets: 98 10*3/uL — ABNORMAL LOW (ref 150–400)
RBC: 2.68 MIL/uL — ABNORMAL LOW (ref 3.87–5.11)
RDW: 15 % (ref 11.5–15.5)
Smear Review: DECREASED
WBC: 3.7 10*3/uL — ABNORMAL LOW (ref 4.0–10.5)
nRBC: 0.5 % — ABNORMAL HIGH (ref 0.0–0.2)

## 2020-08-30 LAB — RESP PANEL BY RT-PCR (FLU A&B, COVID) ARPGX2
Influenza A by PCR: NEGATIVE
Influenza B by PCR: NEGATIVE
SARS Coronavirus 2 by RT PCR: NEGATIVE

## 2020-08-30 IMAGING — MR MR LUMBAR SPINE WO/W CM
4 of 7 series · 18 of 48 positions shown · IV contrast (gadavist)
Comparison: None.

CLINICAL DATA: Demyelinating disease Motor neuron disease
Myelopathy, acute or progressive; Demyelinating disease Motor neuron
disease Myelopathy, acute or progressive Cord compression please
include cauda; Motor neuron disease Myelopathy, acute or progressive

EXAM:
MRI CERVICAL, THORACIC AND LUMBAR SPINE WITHOUT AND WITH CONTRAST
TECHNIQUE: Multiplanar and multiecho pulse sequences of the cervical spine, to
include the craniocervical junction and cervicothoracic junction,
and thoracic and lumbar spine, were obtained without and with
intravenous contrast.
CONTRAST:  7.5mL GADAVIST GADOBUTROL 1 MMOL/ML IV SOLN

[Series 17: T2 · sagittal · 4.0mm · 0.55mm/px · 3 of 15 slices shown (1 of 2)]
[im 1/15]
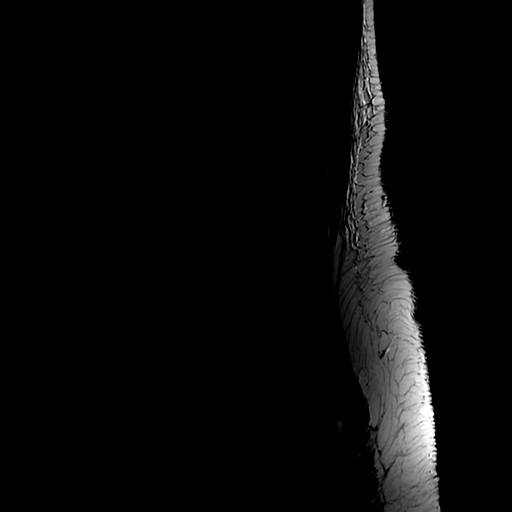
[im 8/15]
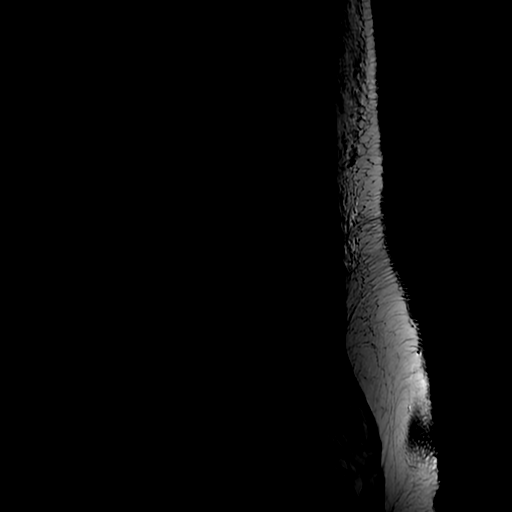
[im 15/15]
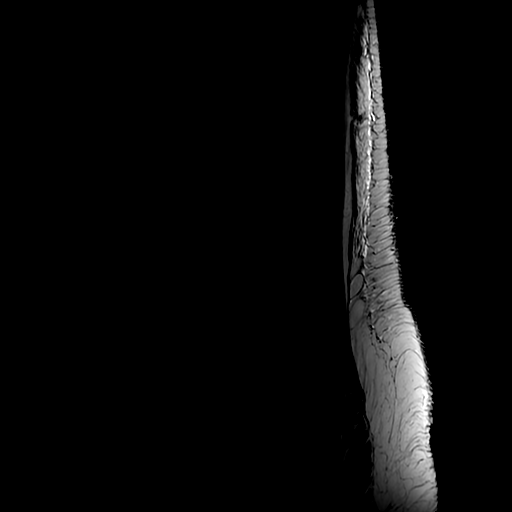

[Series 19: T1 · sagittal · 4.0mm · 0.55mm/px · 3 of 15 slices shown (1 of 2)]
[im 1/15]
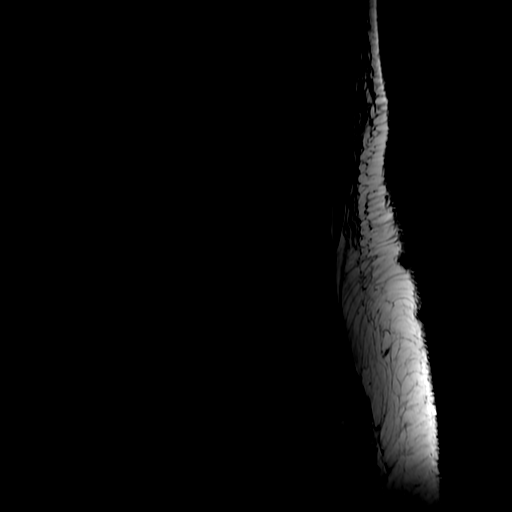
[im 10/15]
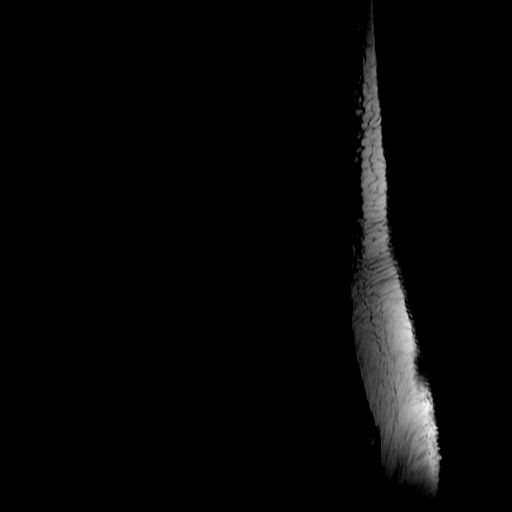
[im 15/15]
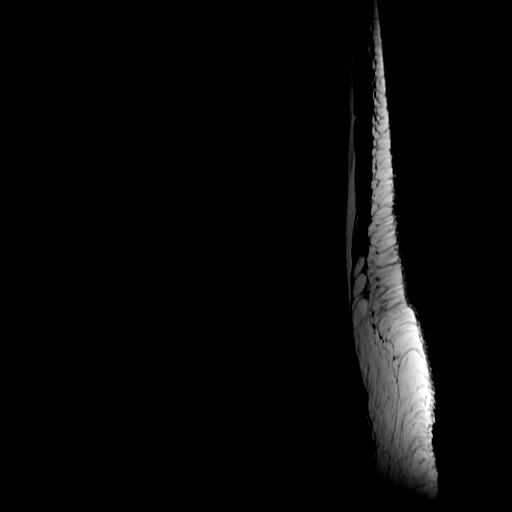

[Series 20: T2 · axial · 4.0mm · 0.39mm/px · z∈[-492,-312]mm · 9 of 42 slices shown (2 of 2)]
[im 1/42]
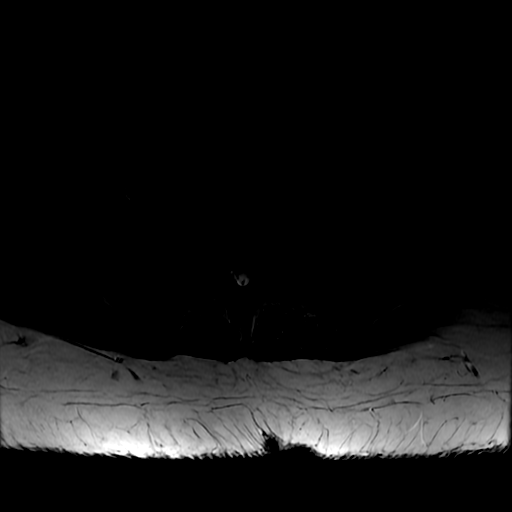
[im 5/42]
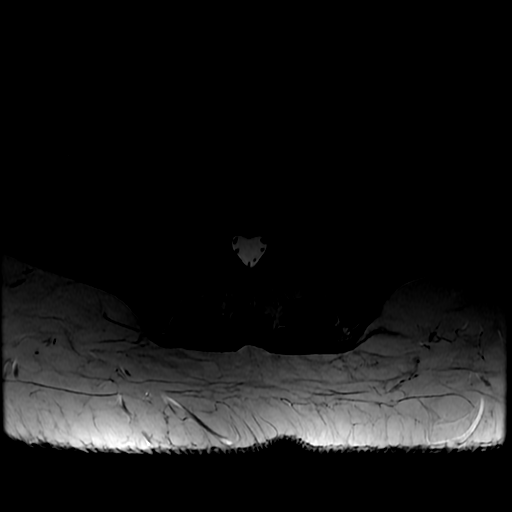
[im 9/42]
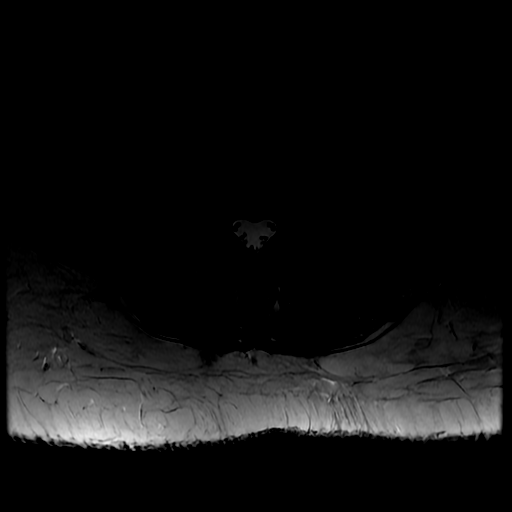
[im 13/42]
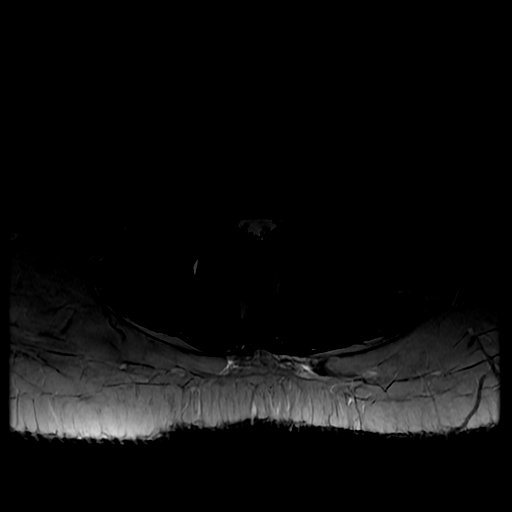
[im 17/42]
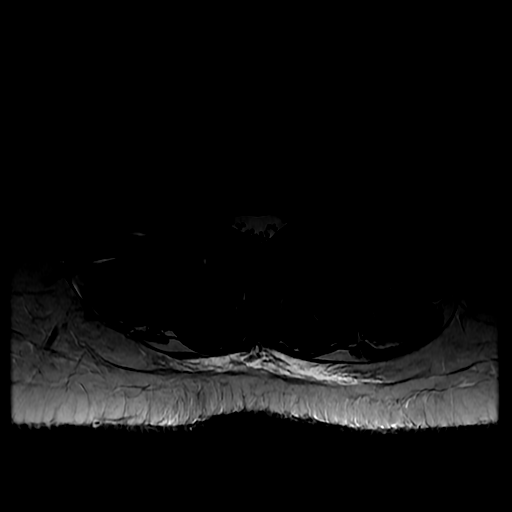
[im 21/42]
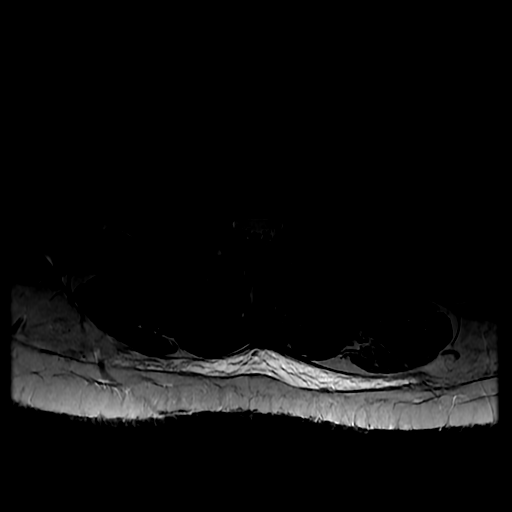
[im 25/42]
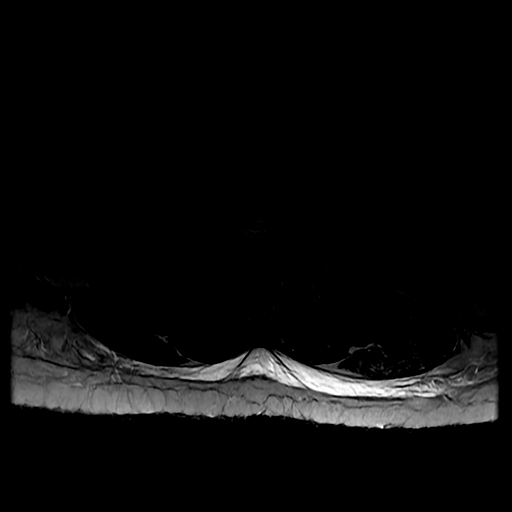
[im 29/42]
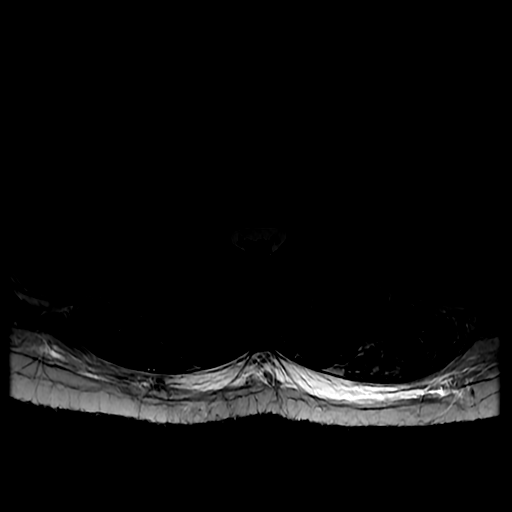
[im 37/42]
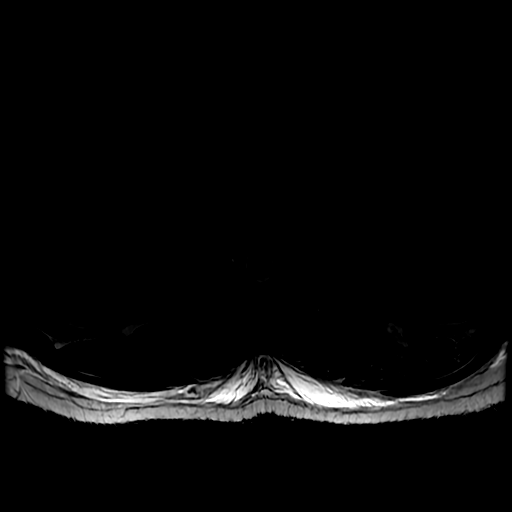

[Series 21: T1 · axial · 4.0mm · 0.39mm/px · z∈[-472,-312]mm · 3 of 42 slices shown (2 of 2)]
[im 5/42]
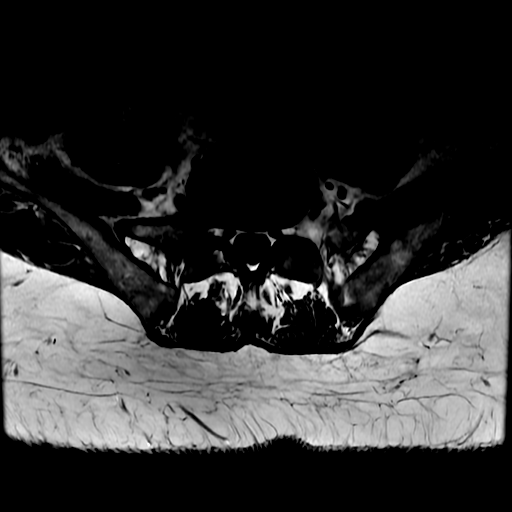
[im 21/42]
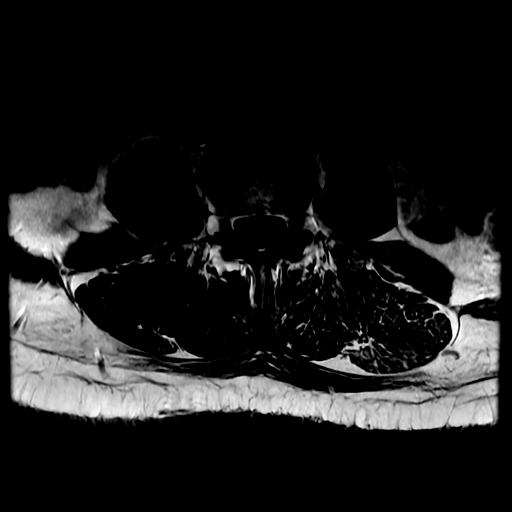
[im 37/42]
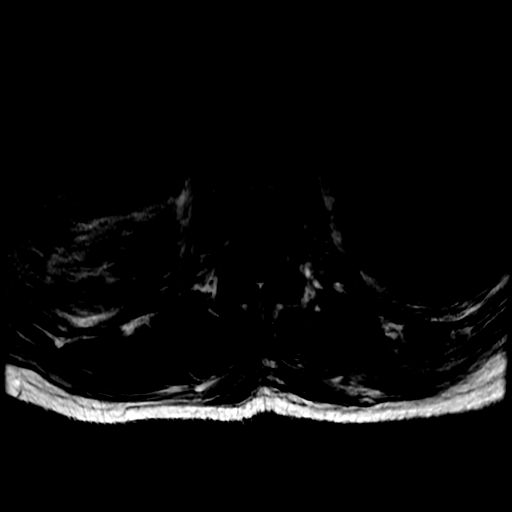

[18 of 48 positions shown; findings below may reference images not displayed]

FINDINGS: MRI CERVICAL SPINE FINDINGS

Alignment: Physiologic.

Vertebrae: No fracture, evidence of discitis, or bone lesion.

Cord: Hydrosyringomyelia of the upper cervical spinal cord from
C2-C4, measuring 6 mm in greatest transverse dimension. There is
hyperintense T2-weighted signal within the dorsal columns of the
spinal cord at the C5-6 levels.

Posterior Fossa, vertebral arteries, paraspinal tissues: Negative

Disc levels:

C2-3: Unremarkable.

C3-4: Small right subarticular disc protrusion with mild right
foraminal stenosis.

C4-5: Small disc bulge with uncovertebral hypertrophy. Moderate
spinal canal stenosis with flattening of the spinal cord.

C5-6: Left asymmetric disc bulge with uncovertebral hypertrophy.
Moderate left foraminal stenosis and mild spinal canal stenosis.

C6-7: No disc herniation or stenosis.

MRI THORACIC SPINE FINDINGS

Alignment:  Physiologic.

Vertebrae: No fracture, evidence of discitis, or bone lesion.

Cord: Hyperintense T2-weighted signal within the dorsal columns
within most of the lower thoracic spine, though detailed assessment
is limited by motion.

Paraspinal and other soft tissues: Negative

Disc levels:

No spinal canal stenosis.

MRI LUMBAR SPINE FINDINGS

Segmentation:  Standard.

Alignment:  Physiologic.

Vertebrae:  No fracture, evidence of discitis, or bone lesion.

Conus medullaris and cauda equina: Conus extends to the L2 level.
Conus and cauda equina appear normal.

Paraspinal and other soft tissues: Negative

Disc levels:

Small disc bulge at L4-5 with mild narrowing of the lateral
recesses. The other lumbar disc levels are unremarkable.

No abnormal contrast enhancement.
IMPRESSION: 1. Hydrosyringomyelia of the upper cervical spinal cord from C2-C4
measuring up to 6mm in transverse diameter. Unclear etiology.
2. Hyperintense T2-weighted signal lesions of the dorsal columns of
the spinal cord at the C5-6 levels and throughout the lower thoracic
spinal cord, most consistent with subacute combined degeneration (as
may be seen in B12 deficiency).
3. Cervical degenerative disc disease with moderate spinal canal
stenosis at C4-5 and mild spinal canal stenosis at C5-6.
4. Mild narrowing of the lateral recesses at L4-5.

## 2020-08-30 IMAGING — MR MR CERVICAL SPINE WO/W CM
4 of 8 series · 18 of 48 positions shown · IV contrast (7.5 M GAD)
Comparison: None.

CLINICAL DATA: Demyelinating disease Motor neuron disease
Myelopathy, acute or progressive; Demyelinating disease Motor neuron
disease Myelopathy, acute or progressive Cord compression please
include cauda; Motor neuron disease Myelopathy, acute or progressive

EXAM:
MRI CERVICAL, THORACIC AND LUMBAR SPINE WITHOUT AND WITH CONTRAST
TECHNIQUE: Multiplanar and multiecho pulse sequences of the cervical spine, to
include the craniocervical junction and cervicothoracic junction,
and thoracic and lumbar spine, were obtained without and with
intravenous contrast.
CONTRAST:  7.5mL GADAVIST GADOBUTROL 1 MMOL/ML IV SOLN

[Series 2: T2 · sagittal · 3.0mm · 0.35mm/px · 3 of 18 slices shown (1 of 2)]
[im 1/18]
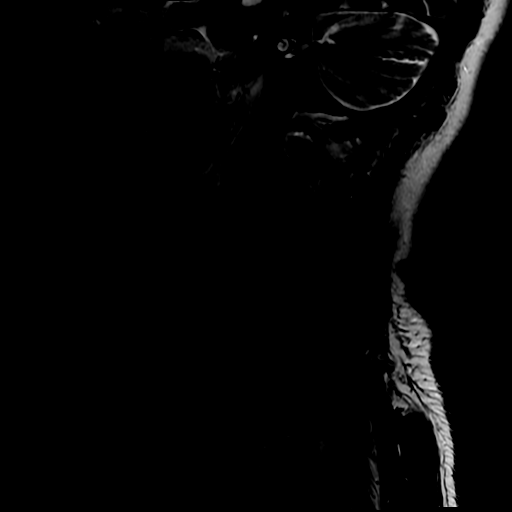
[im 9/18]
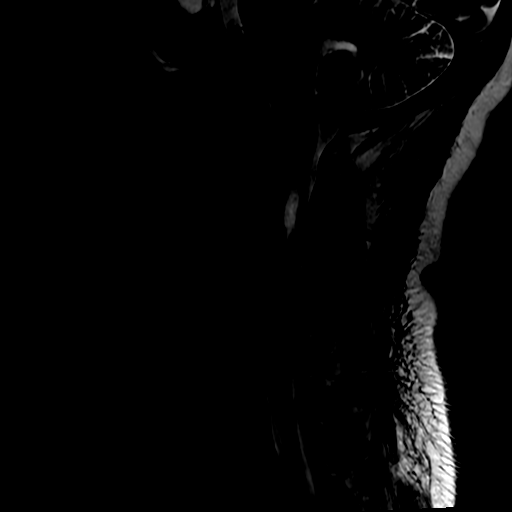
[im 18/18]
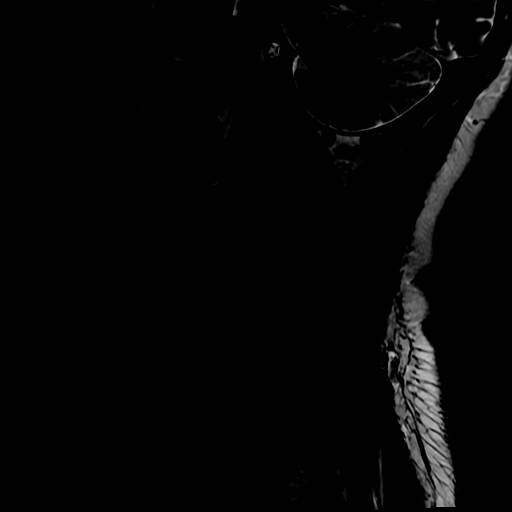

[Series 6: T2 · axial · 3.0mm · 0.35mm/px · z∈[-104,+5]mm · 6 of 35 slices shown (2 of 2)]
[im 1/35]
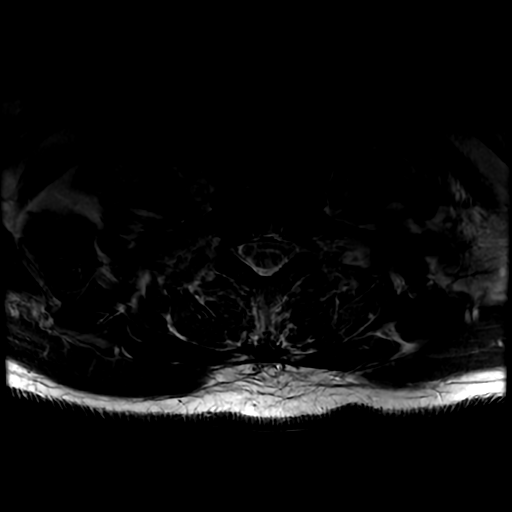
[im 7/35]
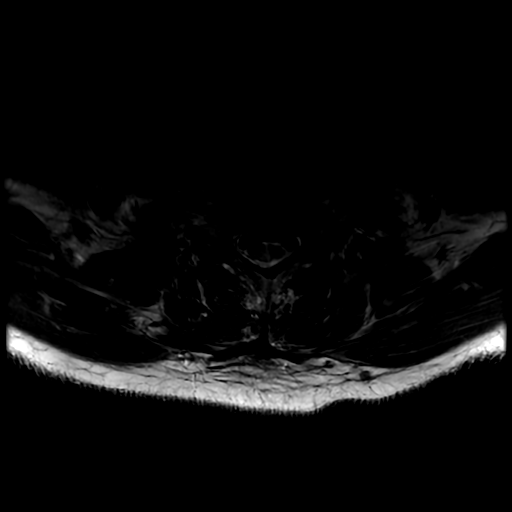
[im 14/35]
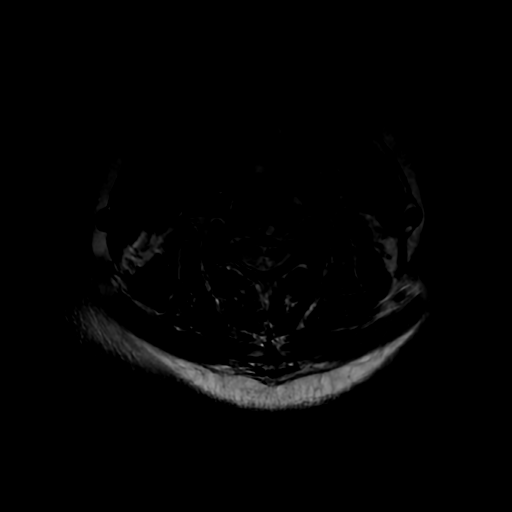
[im 21/35]
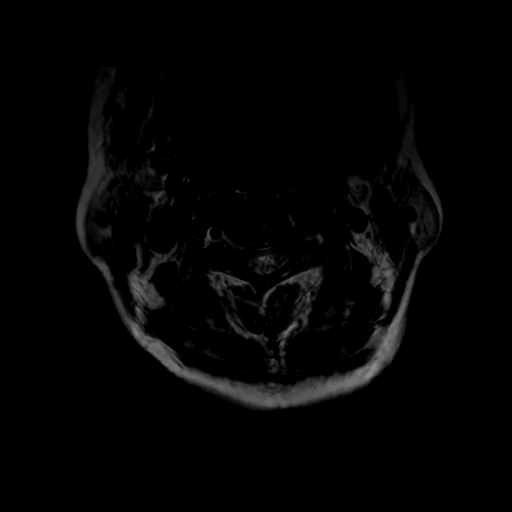
[im 28/35]
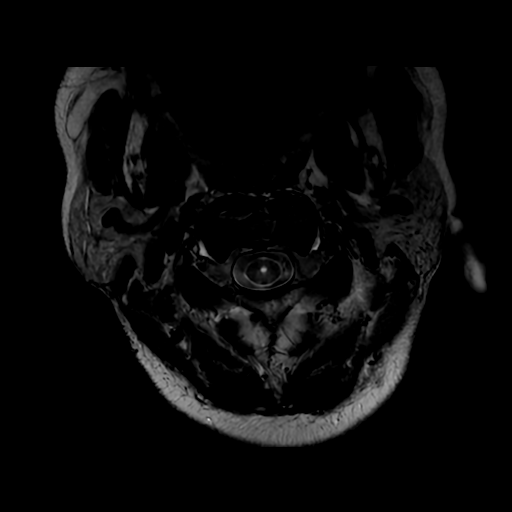
[im 35/35]
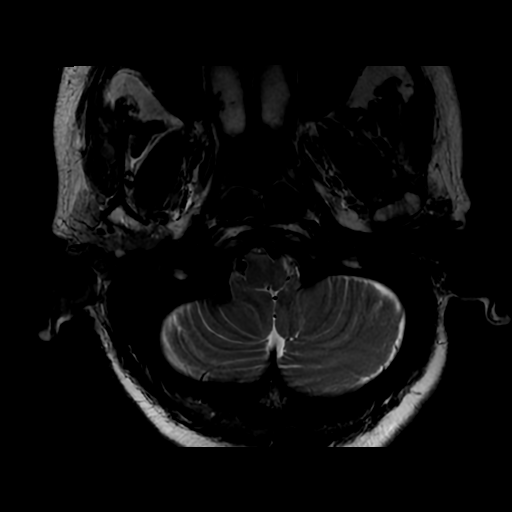

[Series 7: T1 · axial · non-contrast · 3.0mm · 0.35mm/px · z∈[-104,+5]mm · 6 of 34 slices shown]
[im 1/34]
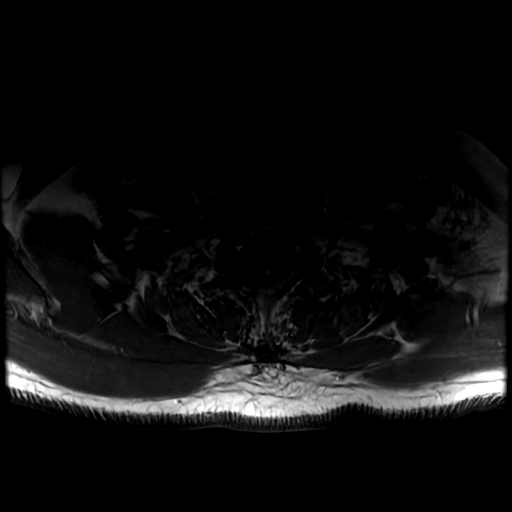
[im 7/34]
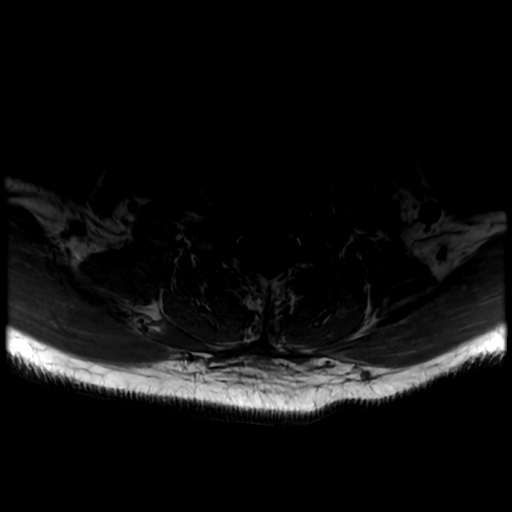
[im 14/34]
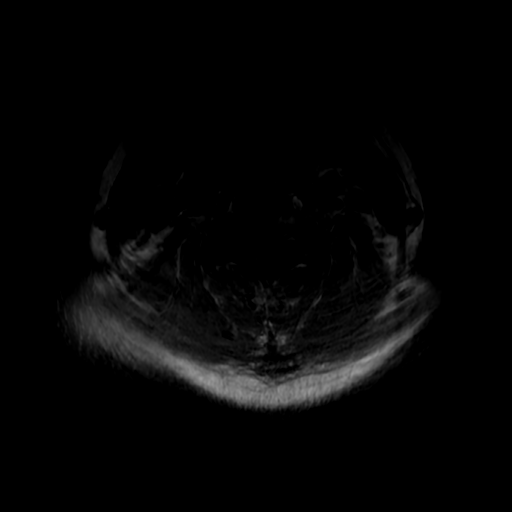
[im 20/34]
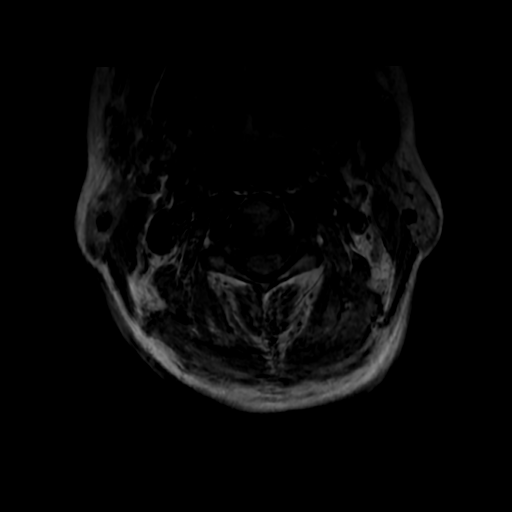
[im 27/34]
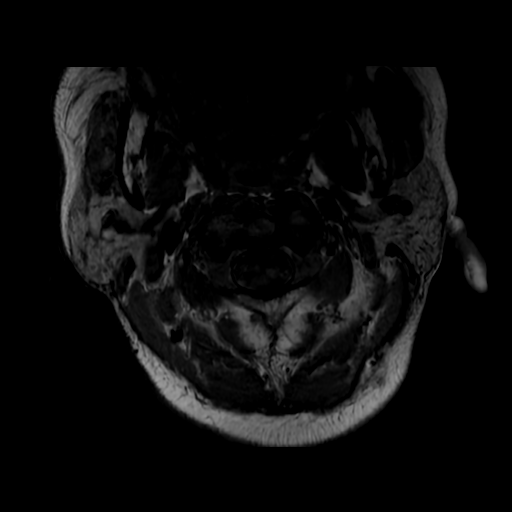
[im 34/34]
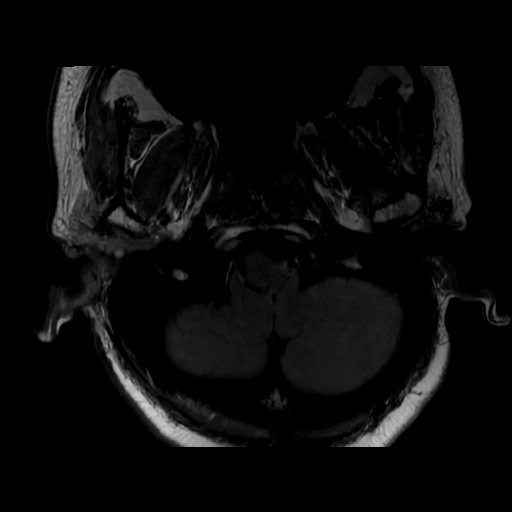

[Series 26: T1 fat-sat post-contrast · sagittal · 3.0mm · 0.35mm/px · 3 of 18 slices shown]
[im 1/18]
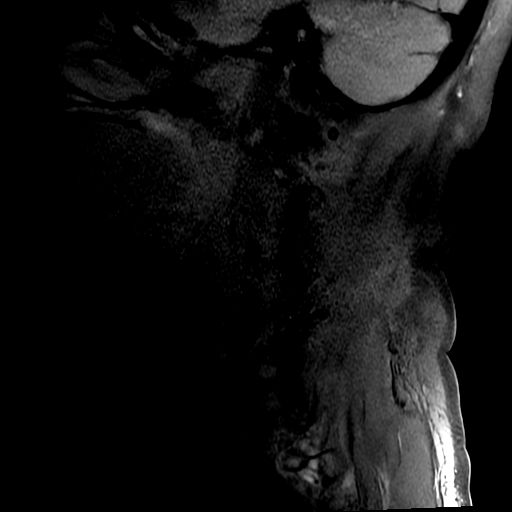
[im 9/18]
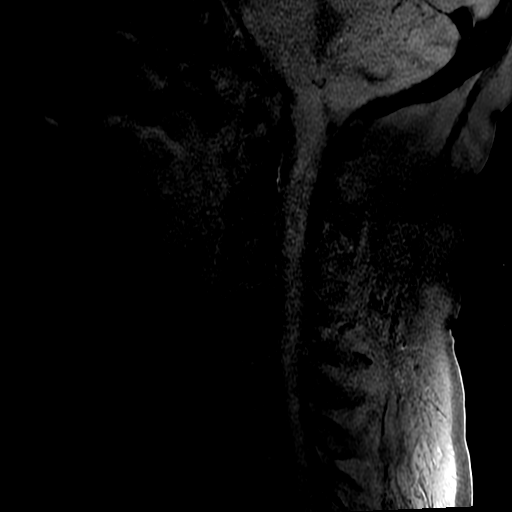
[im 18/18]
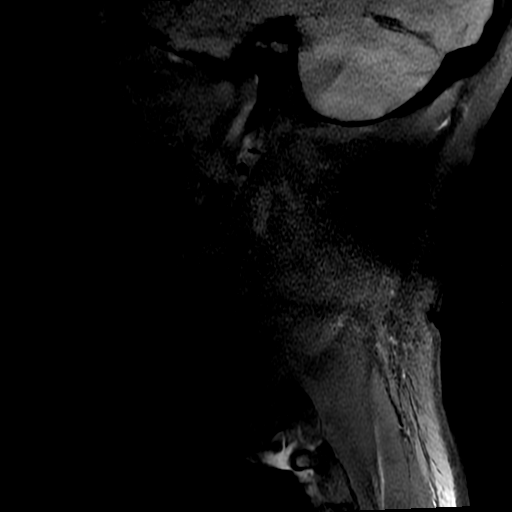

[18 of 48 positions shown; findings below may reference images not displayed]

FINDINGS: MRI CERVICAL SPINE FINDINGS

Alignment: Physiologic.

Vertebrae: No fracture, evidence of discitis, or bone lesion.

Cord: Hydrosyringomyelia of the upper cervical spinal cord from
C2-C4, measuring 6 mm in greatest transverse dimension. There is
hyperintense T2-weighted signal within the dorsal columns of the
spinal cord at the C5-6 levels.

Posterior Fossa, vertebral arteries, paraspinal tissues: Negative

Disc levels:

C2-3: Unremarkable.

C3-4: Small right subarticular disc protrusion with mild right
foraminal stenosis.

C4-5: Small disc bulge with uncovertebral hypertrophy. Moderate
spinal canal stenosis with flattening of the spinal cord.

C5-6: Left asymmetric disc bulge with uncovertebral hypertrophy.
Moderate left foraminal stenosis and mild spinal canal stenosis.

C6-7: No disc herniation or stenosis.

MRI THORACIC SPINE FINDINGS

Alignment:  Physiologic.

Vertebrae: No fracture, evidence of discitis, or bone lesion.

Cord: Hyperintense T2-weighted signal within the dorsal columns
within most of the lower thoracic spine, though detailed assessment
is limited by motion.

Paraspinal and other soft tissues: Negative

Disc levels:

No spinal canal stenosis.

MRI LUMBAR SPINE FINDINGS

Segmentation:  Standard.

Alignment:  Physiologic.

Vertebrae:  No fracture, evidence of discitis, or bone lesion.

Conus medullaris and cauda equina: Conus extends to the L2 level.
Conus and cauda equina appear normal.

Paraspinal and other soft tissues: Negative

Disc levels:

Small disc bulge at L4-5 with mild narrowing of the lateral
recesses. The other lumbar disc levels are unremarkable.

No abnormal contrast enhancement.
IMPRESSION: 1. Hydrosyringomyelia of the upper cervical spinal cord from C2-C4
measuring up to 6mm in transverse diameter. Unclear etiology.
2. Hyperintense T2-weighted signal lesions of the dorsal columns of
the spinal cord at the C5-6 levels and throughout the lower thoracic
spinal cord, most consistent with subacute combined degeneration (as
may be seen in B12 deficiency).
3. Cervical degenerative disc disease with moderate spinal canal
stenosis at C4-5 and mild spinal canal stenosis at C5-6.
4. Mild narrowing of the lateral recesses at L4-5.

## 2020-08-30 IMAGING — CT CT L SPINE W/O CM
3 series · 10 of 33 positions shown, 12 images · non-contrast
Comparison: None

CLINICAL DATA: Low back pain, cauda equina syndrome suspected.
Additional history provided: Extremity weakness, motor neuron
disease.

EXAM:
CT LUMBAR SPINE WITHOUT CONTRAST
TECHNIQUE: Multidetector CT imaging of the lumbar spine was performed without
intravenous contrast administration. Multiplanar CT image
reconstructions were also generated.

[Series 4: l spine soft · axial · 0.29mm/px · z∈[+395,+517]mm · 2 of 133 slices shown, 3 images]
[im 41/133  soft-tissue]
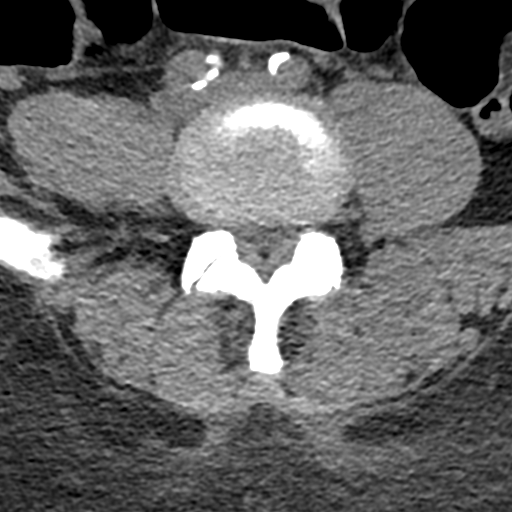
[im 41/133  bone]
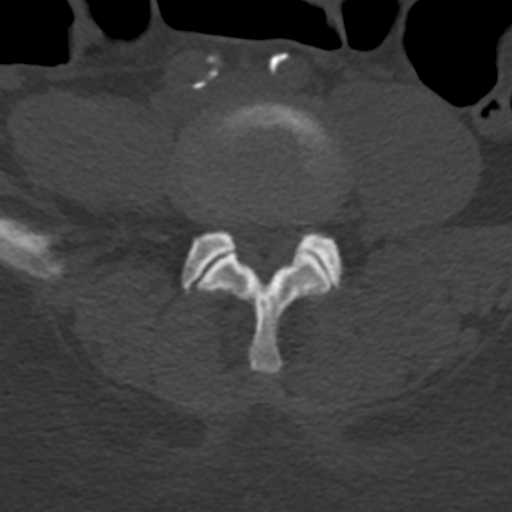
[im 102/133  bone]
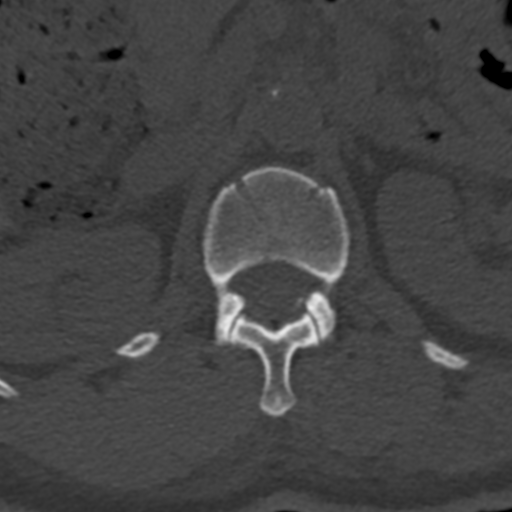

[Series 6: coronal bone · coronal · 0.36mm/px · 3 of 62 slices shown]
[im 13/62  bone]
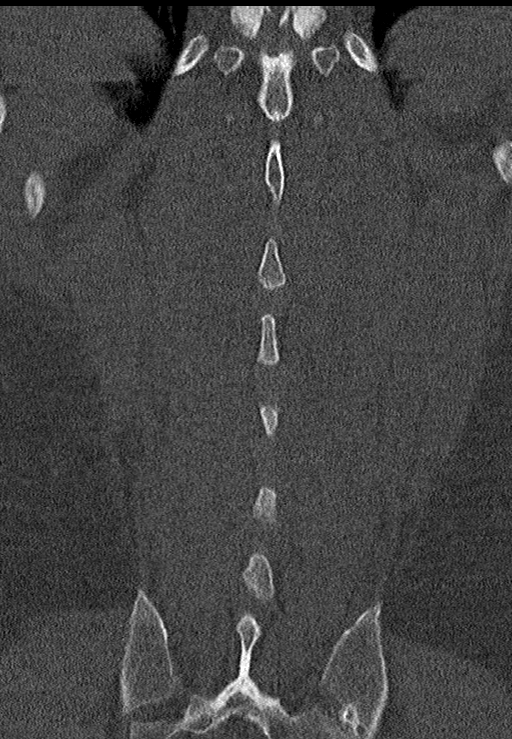
[im 25/62  bone]
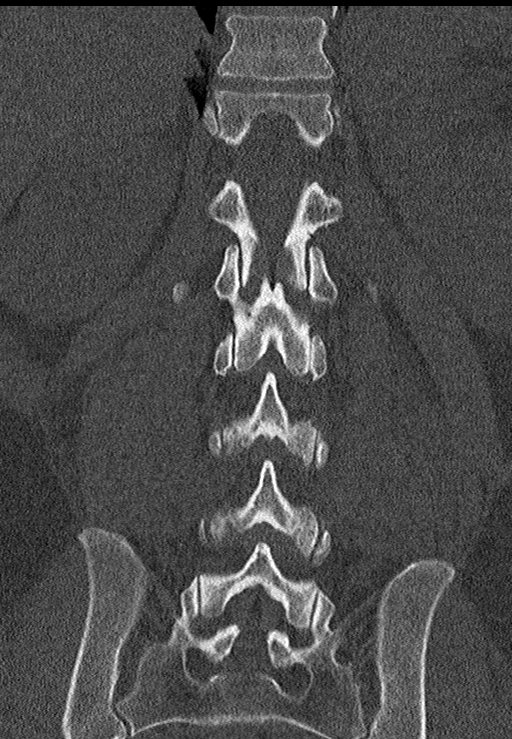
[im 37/62  bone]
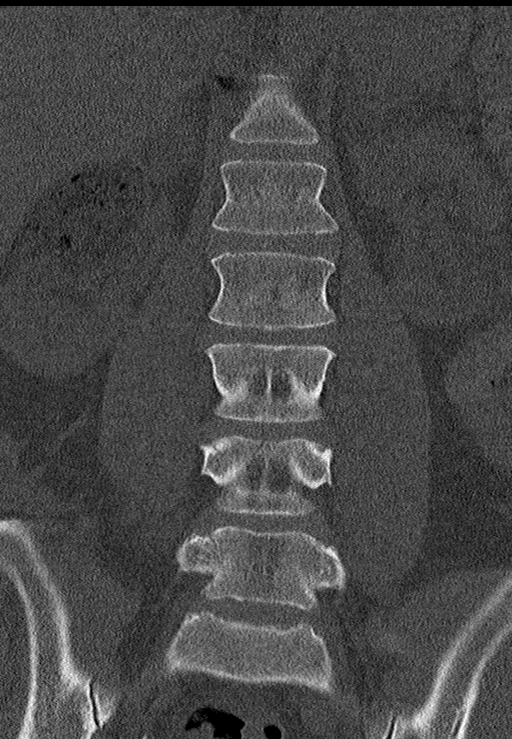

[Series 7: sagittal soft · sagittal · 0.30mm/px · 5 of 67 slices shown, 6 images]
[im 23/67  bone]
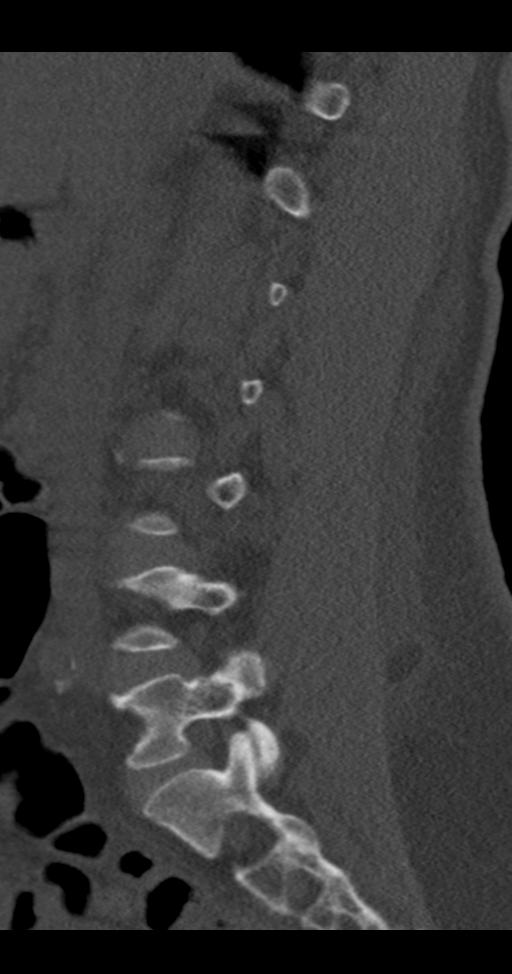
[im 28/67  bone]
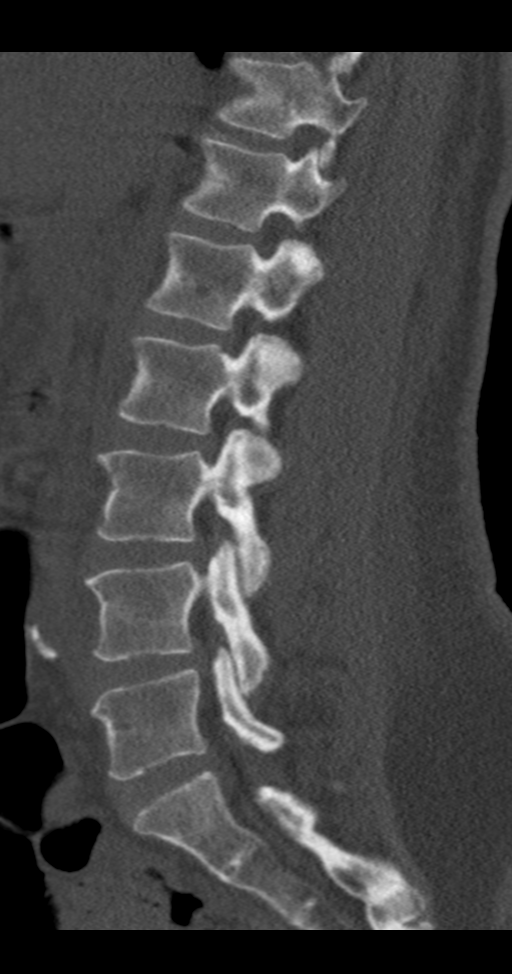
[im 34/67  soft-tissue]
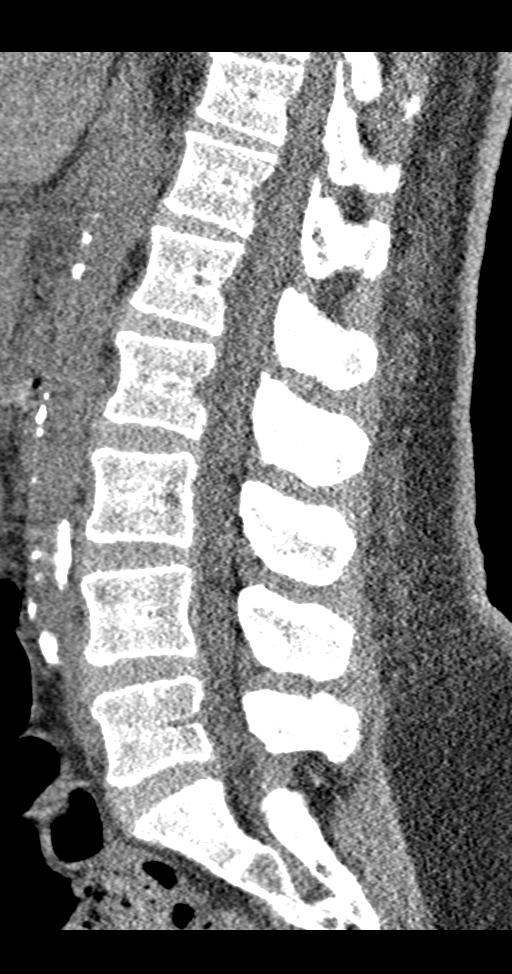
[im 34/67  bone]
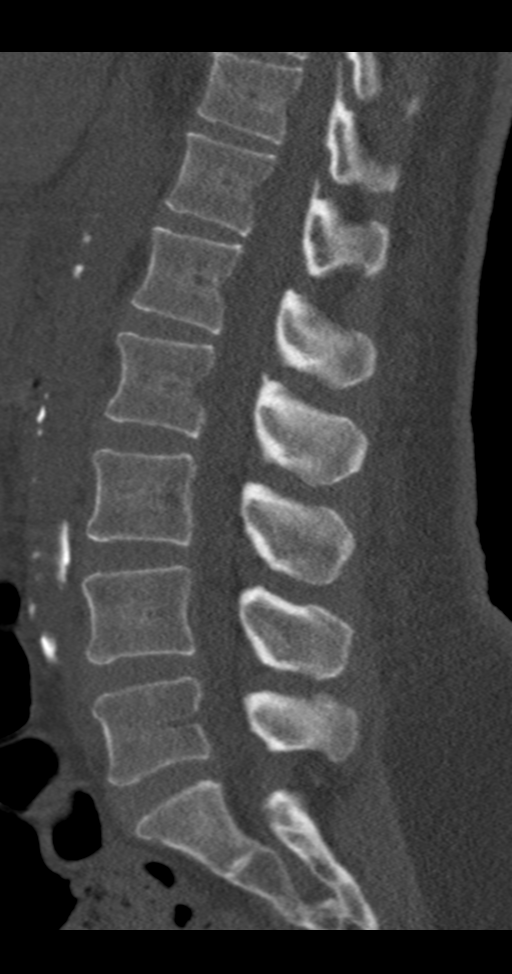
[im 39/67  bone]
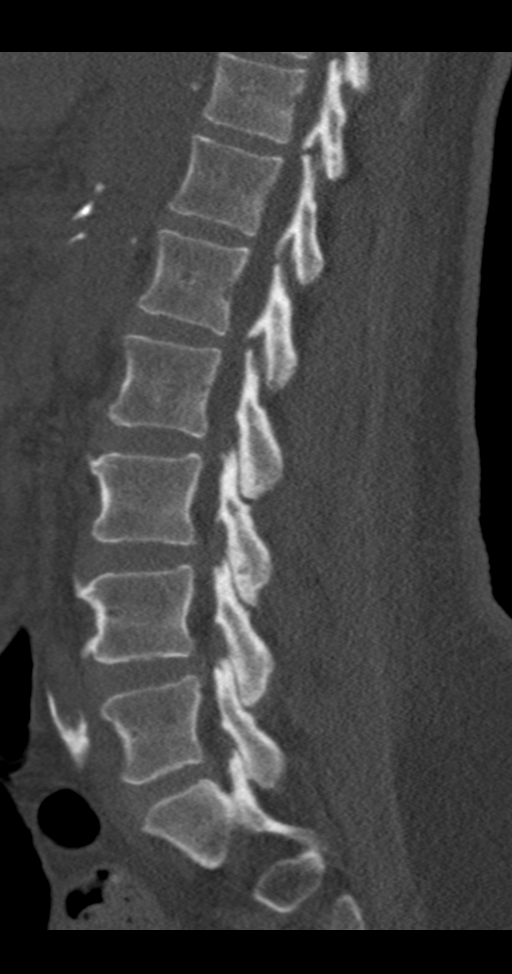
[im 45/67  bone]
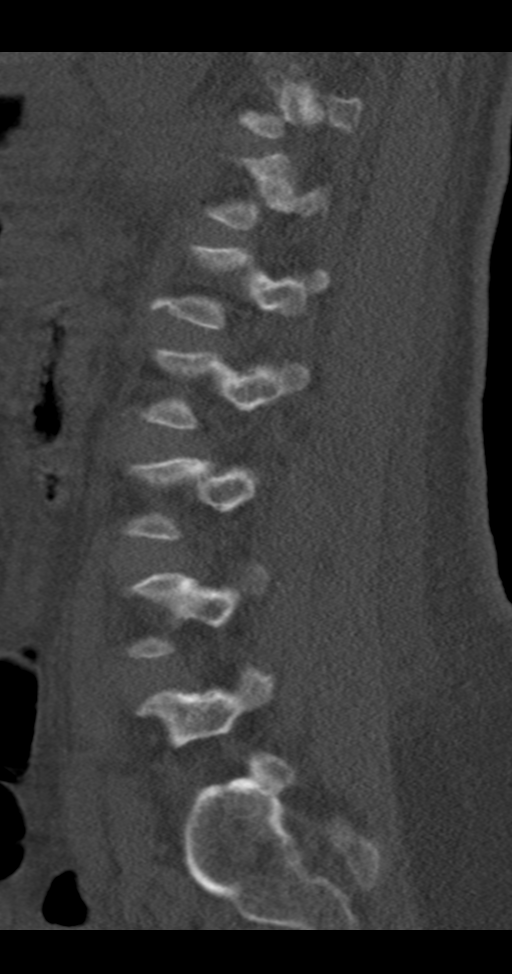

[10 of 33 positions shown; findings below may reference images not displayed]

FINDINGS: Segmentation: 5 lumbar vertebrae. The caudal most well-formed
intervertebral disc space is designated

Alignment: No significant spondylolisthesis.

Vertebrae: Vertebral body height is maintained. No evidence of
fracture to the lumbar spine.

Paraspinal and other soft tissues: Paraspinal soft tissues
unremarkable. Aortoiliac atherosclerosis.

Disc levels:

No more than mild disc space narrowing at any level.

T10-T11: Disc bulge. Facet arthrosis and ligamentum flavum
calcification. No appreciable significant spinal canal stenosis. The
neural foramina are incompletely included in the field of view.

T11-T12: Disc bulge. Facet arthrosis and ligamentum flavum
calcification. No appreciable significant spinal canal stenosis.
Moderate left neural foraminal narrowing.

T12-L1: Disc bulge. Mild facet arthrosis and ligamentum flavum
calcification. No appreciable significant spinal canal stenosis or
neural foraminal narrowing.

L1-L2: Disc bulge. Facet arthrosis with ligamentum flavum
hypertrophy. No appreciable significant spinal canal stenosis or
neural foraminal narrowing.

L2-L3: Disc bulge. Facet arthrosis with ligamentum flavum
hypertrophy. Suspected at least moderate spinal canal stenosis. Mild
left neural foraminal narrowing.

L3-L4: Disc bulge. Facet arthrosis with ligamentum flavum
hypertrophy. Suspected at least moderate spinal canal stenosis.
Bilateral neural foraminal narrowing (mild right, moderate/severe
left).

L4-L5: Disc bulge. Facet arthrosis with ligamentum flavum
hypertrophy. Suspected at least moderate spinal canal stenosis.
Bilateral neural foraminal narrowing (mild right, moderate left).

L5-S1: Disc bulge. Facet arthrosis with ligamentum flavum
hypertrophy. No appreciable significant spinal canal stenosis or
neural foraminal narrowing.
IMPRESSION: Lumbar spondylosis, as outlined. Multilevel spinal canal stenosis.
Most notably, there is suspected at least moderate spinal canal
stenosis at L2-L3, L3-L4 and L4-L5. Given the clinical concern for
cauda equina syndrome, an MRI should be considered for further
evaluation if the patient is able to have one. Multilevel foraminal
stenosis, as detailed and greatest on the left at T11-T12
(moderate), on the left at L3-L4 (moderate/severe) and on the left
at L4-L5 (moderate).

Aortic Atherosclerosis ([5A]-[5A]).

## 2020-08-30 IMAGING — CT CT HEAD W/O CM
4 series · 16 of 47 positions shown, 18 images · non-contrast
Comparison: None.

CLINICAL DATA: Low back pain and extremity weakness, initial
encounter

EXAM:
CT HEAD WITHOUT CONTRAST
TECHNIQUE: Contiguous axial images were obtained from the base of the skull
through the vertex without intravenous contrast.

[Series 2: head wo · axial · 0.45mm/px · z∈[+879,+984]mm · 7 of 29 slices shown, 9 images]
[im 4/29  brain]
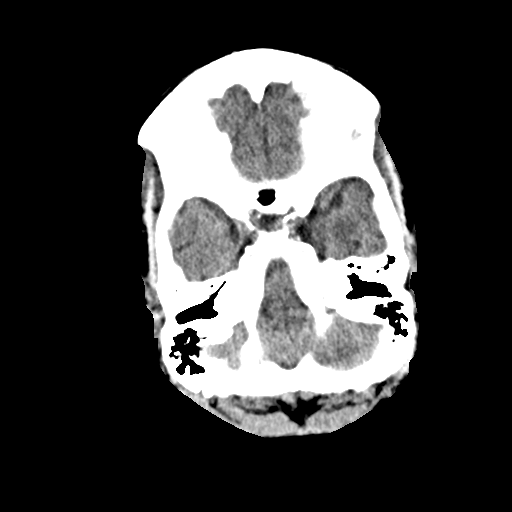
[im 4/29  bone]
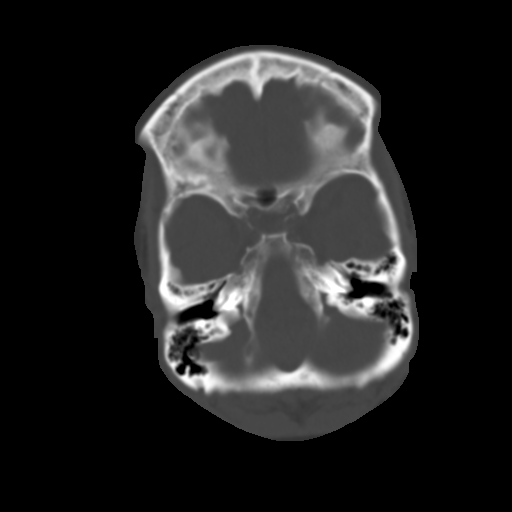
[im 8/29  brain]
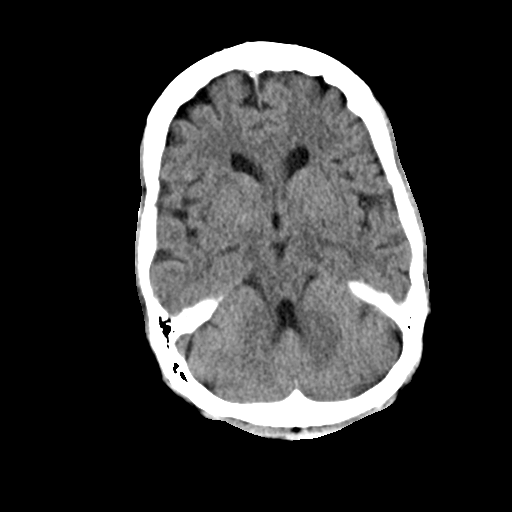
[im 11/29  brain]
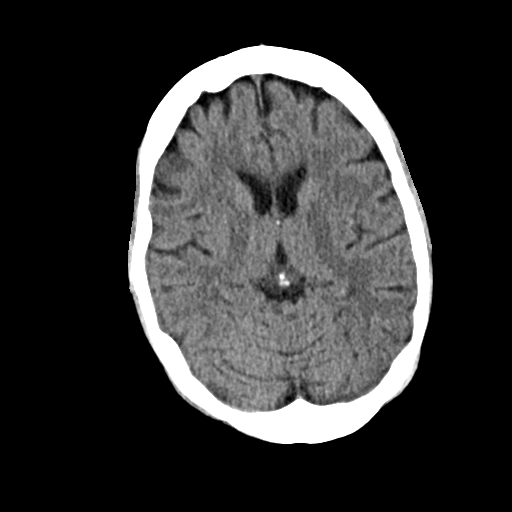
[im 15/29  brain]
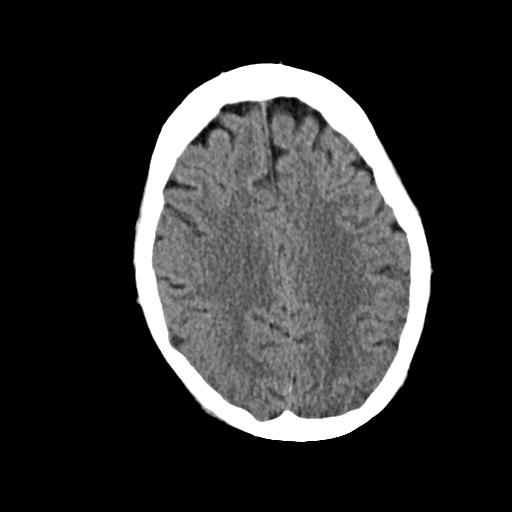
[im 18/29  brain]
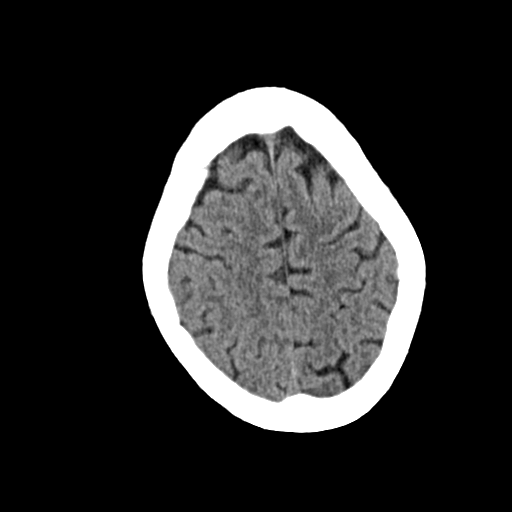
[im 18/29  bone]
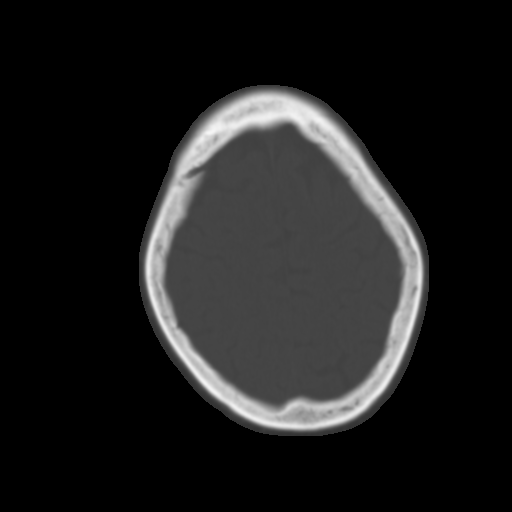
[im 22/29  brain]
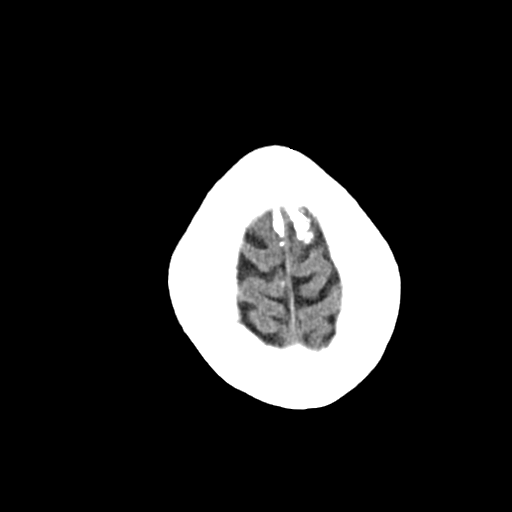
[im 25/29  brain]
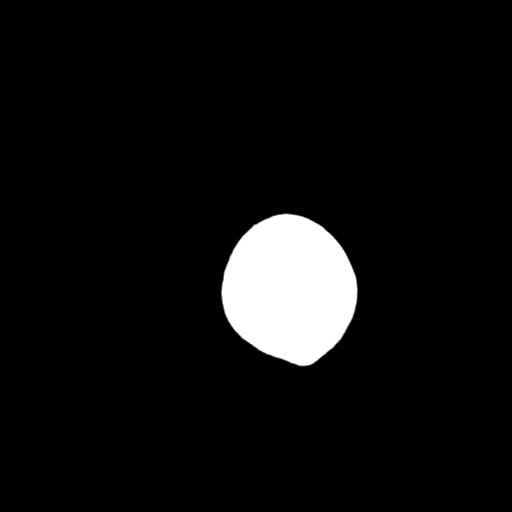

[Series 3: head bone · axial · 0.45mm/px · z∈[+878,+906]mm · 3 of 72 slices shown]
[im 8/72  bone]
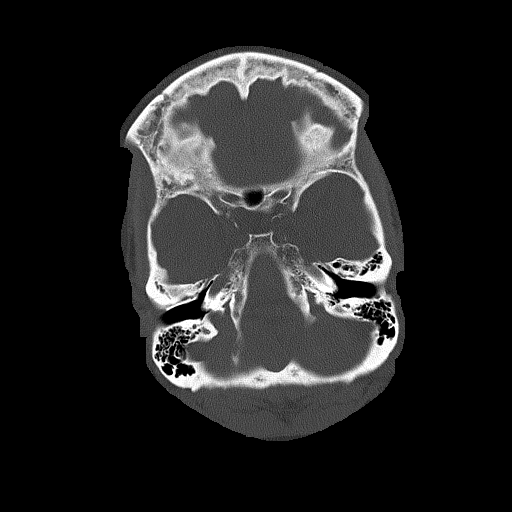
[im 15/72  bone]
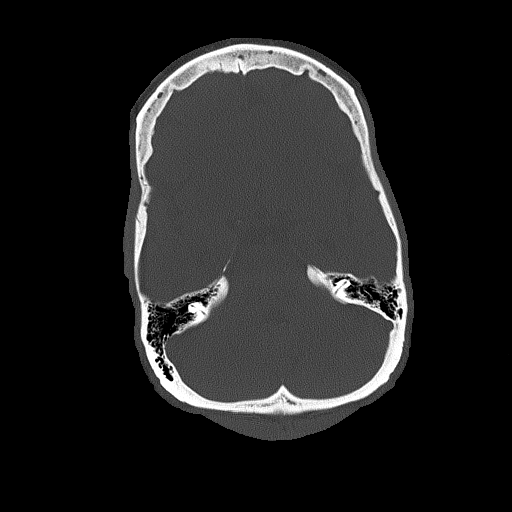
[im 22/72  bone]
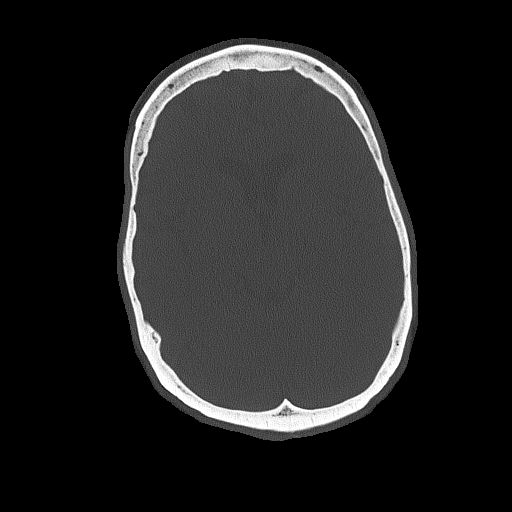

[Series 4: coronal soft · coronal · 0.28mm/px · 3 of 67 slices shown]
[im 23/67  brain]
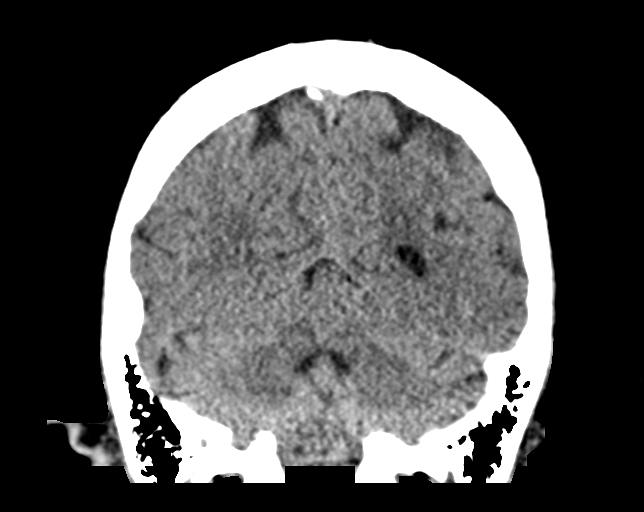
[im 30/67  brain]
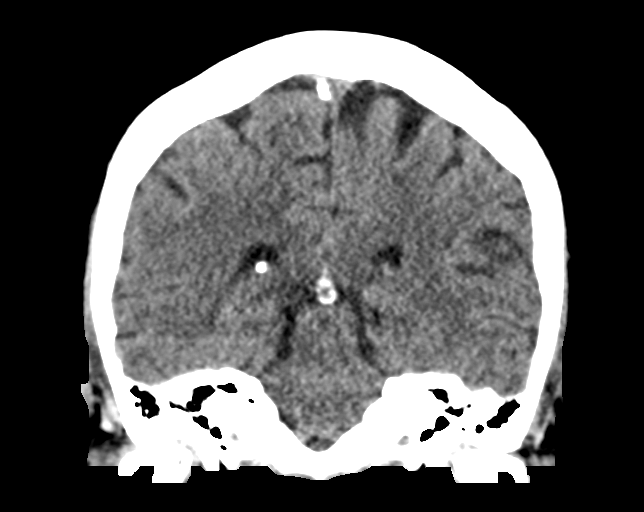
[im 37/67  brain]
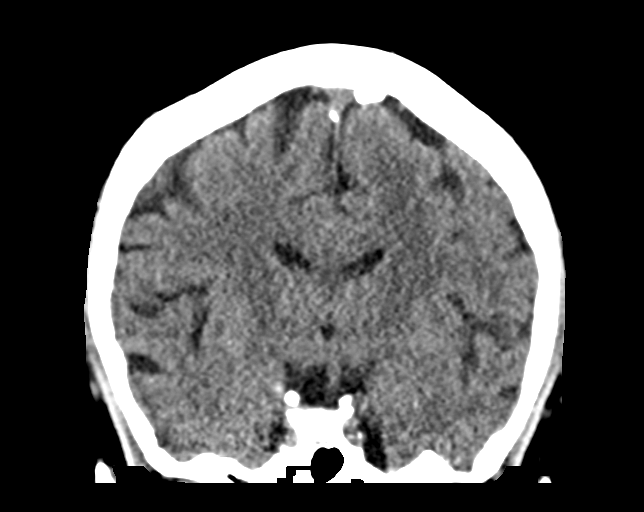

[Series 5: sag soft · sagittal · 0.28mm/px · 3 of 67 slices shown]
[im 23/67  brain]
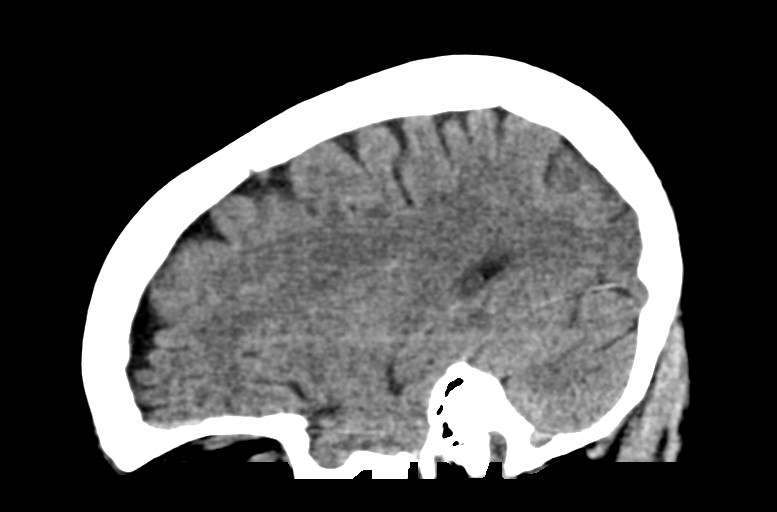
[im 34/67  brain]
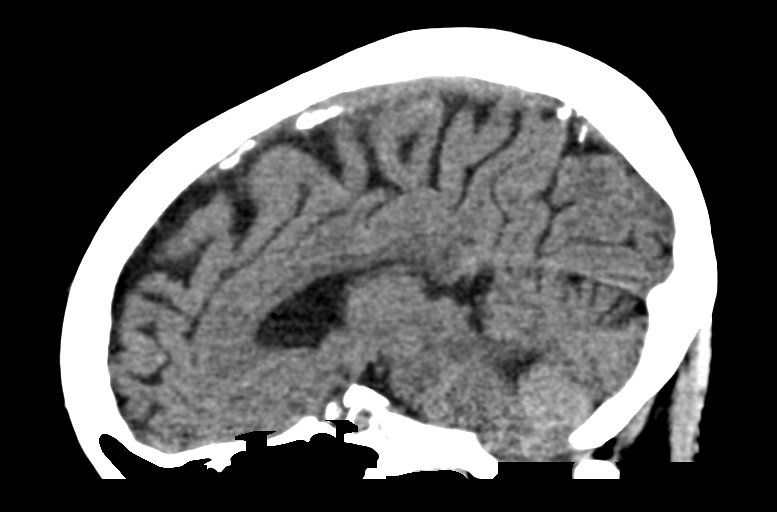
[im 45/67  brain]
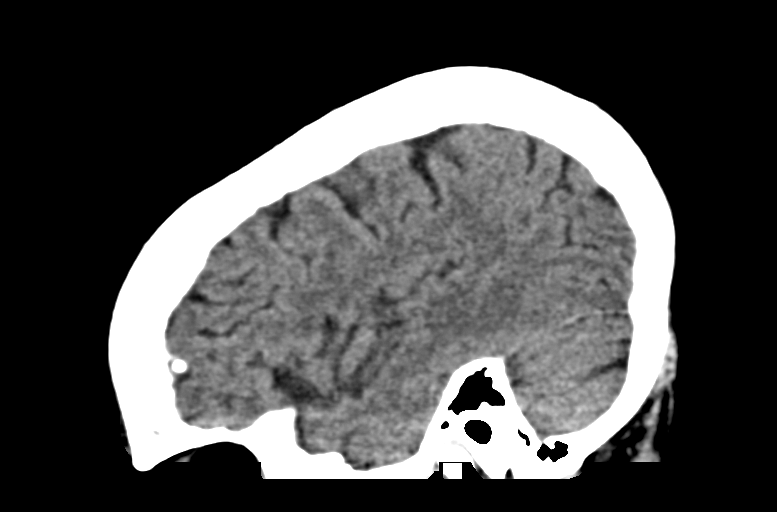

[16 of 47 positions shown; findings below may reference images not displayed]

FINDINGS: Brain: No evidence of acute infarction, hemorrhage, hydrocephalus,
extra-axial collection or mass lesion/mass effect. Mild atrophic
changes are noted. Mild basal ganglia calcifications are seen as
well.

Vascular: No hyperdense vessel or unexpected calcification.

Skull: Normal. Negative for fracture or focal lesion.

Sinuses/Orbits: No acute finding.

Other: None.
IMPRESSION: Chronic atrophic changes without acute abnormality.

## 2020-08-30 IMAGING — MR MR THORACIC SPINE WO/W CM
5 of 9 series · 18 of 48 positions shown · IV contrast (gadavist)
Comparison: None.

CLINICAL DATA: Demyelinating disease Motor neuron disease
Myelopathy, acute or progressive; Demyelinating disease Motor neuron
disease Myelopathy, acute or progressive Cord compression please
include cauda; Motor neuron disease Myelopathy, acute or progressive

EXAM:
MRI CERVICAL, THORACIC AND LUMBAR SPINE WITHOUT AND WITH CONTRAST
TECHNIQUE: Multiplanar and multiecho pulse sequences of the cervical spine, to
include the craniocervical junction and cervicothoracic junction,
and thoracic and lumbar spine, were obtained without and with
intravenous contrast.
CONTRAST:  7.5mL GADAVIST GADOBUTROL 1 MMOL/ML IV SOLN

[Series 9: T1 · sagittal · 3.0mm · 0.90mm/px · 3 of 16 slices shown (1 of 3)]
[im 1/16]
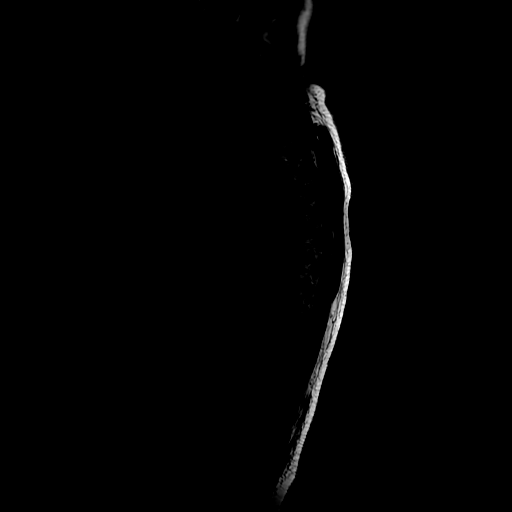
[im 8/16]
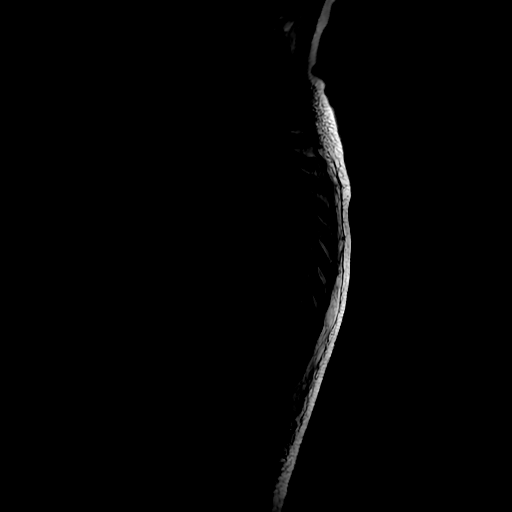
[im 16/16]
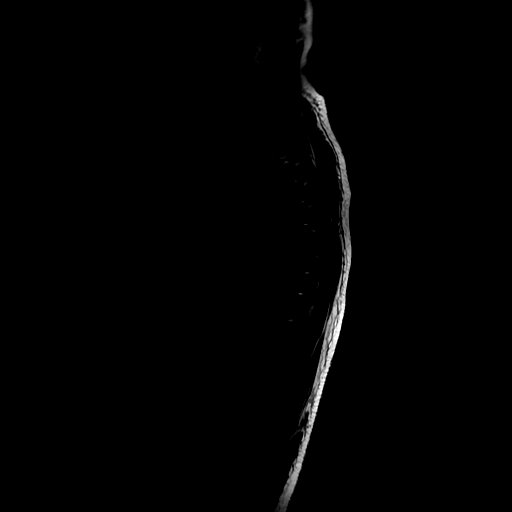

[Series 10: T2 · sagittal · 3.0mm · 0.66mm/px · 3 of 17 slices shown (1 of 2)]
[im 1/17]
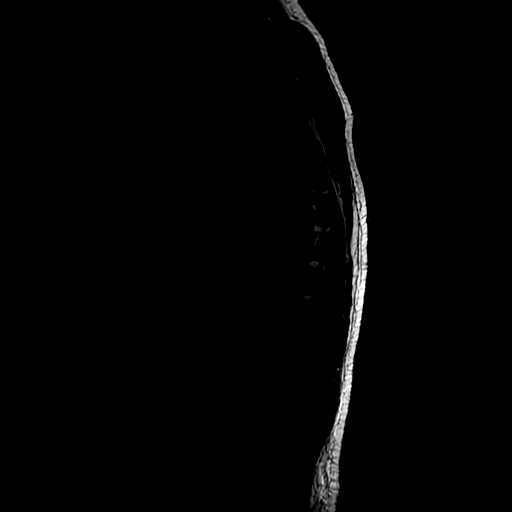
[im 9/17]
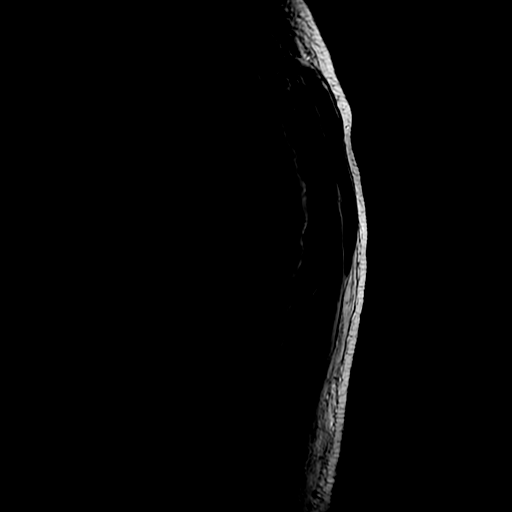
[im 17/17]
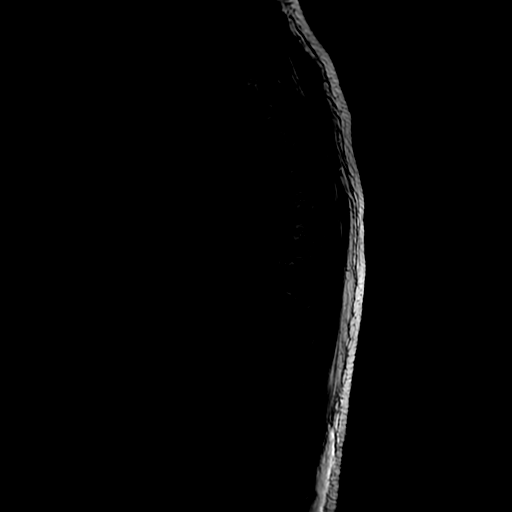

[Series 12: T1 · sagittal · 3.0mm · 0.66mm/px · 3 of 17 slices shown (2 of 3)]
[im 1/17]
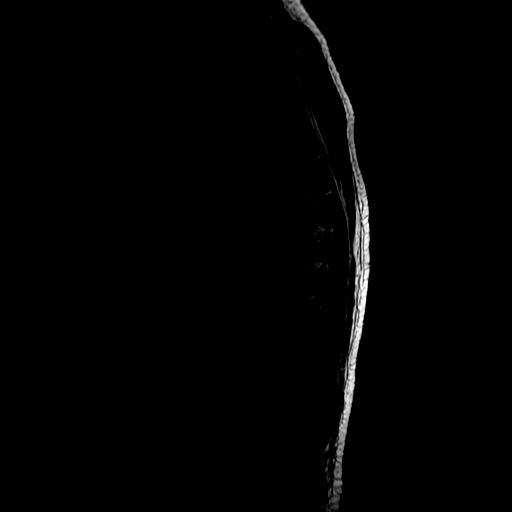
[im 9/17]
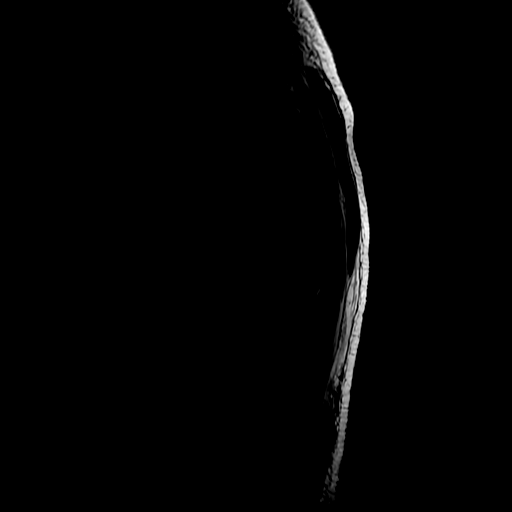
[im 17/17]
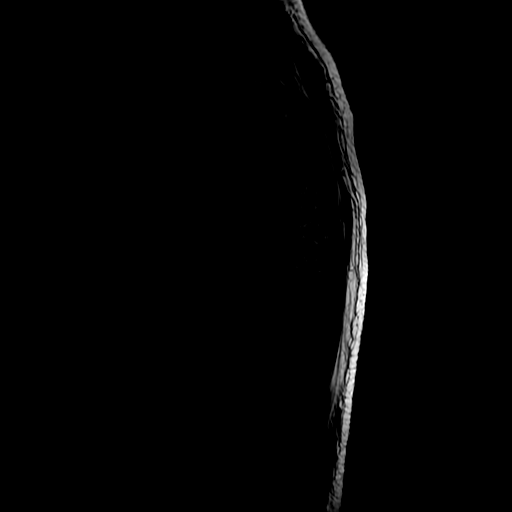

[Series 13: T2 · axial · 4.0mm · 0.39mm/px · z∈[-324,-61]mm · 8 of 49 slices shown (2 of 2)]
[im 1/49]
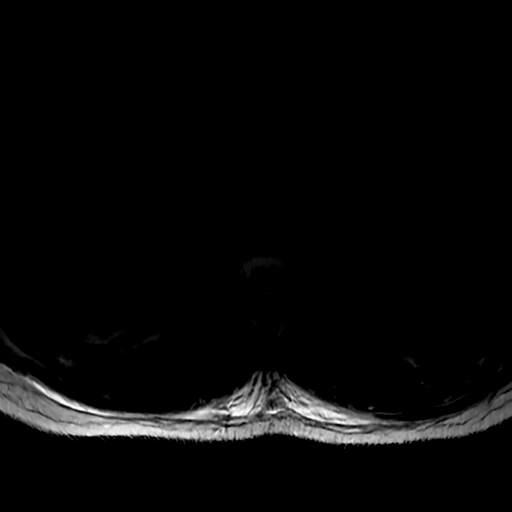
[im 7/49]
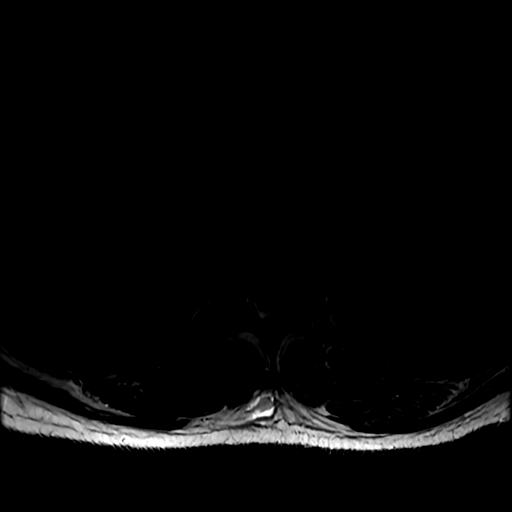
[im 14/49]
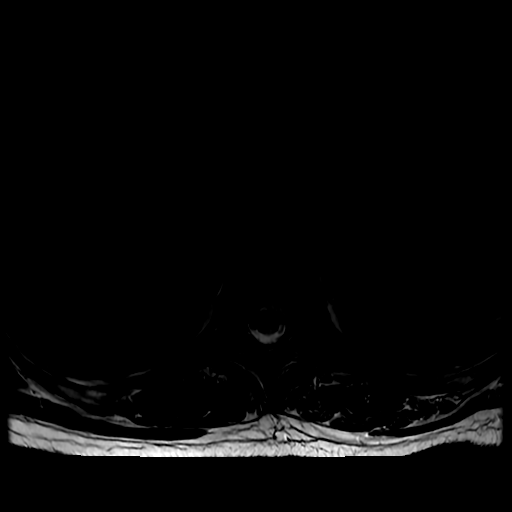
[im 21/49]
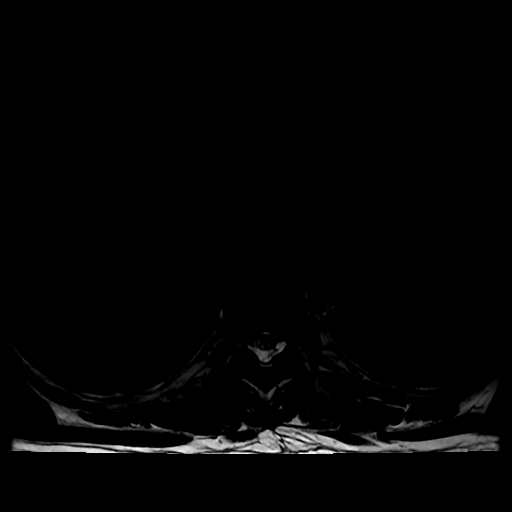
[im 28/49]
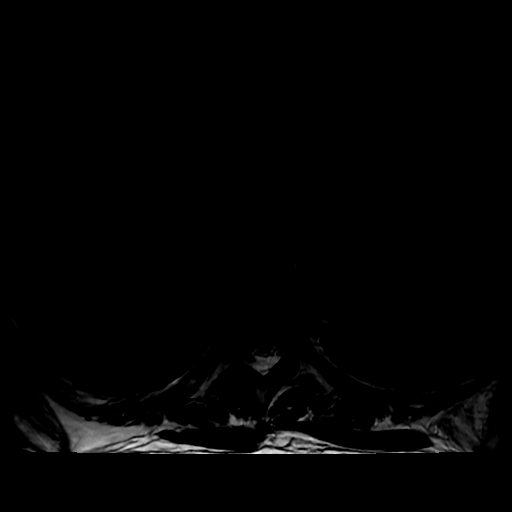
[im 35/49]
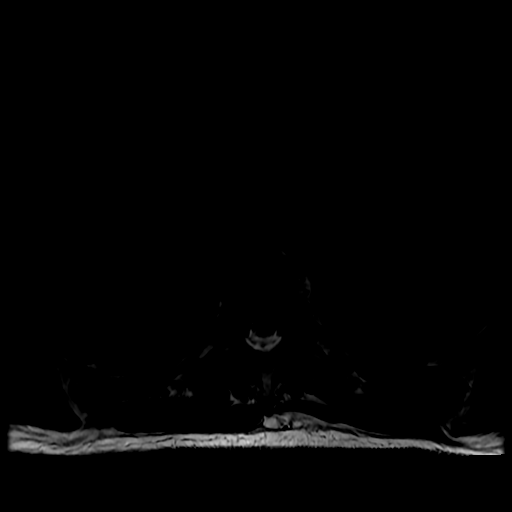
[im 42/49]
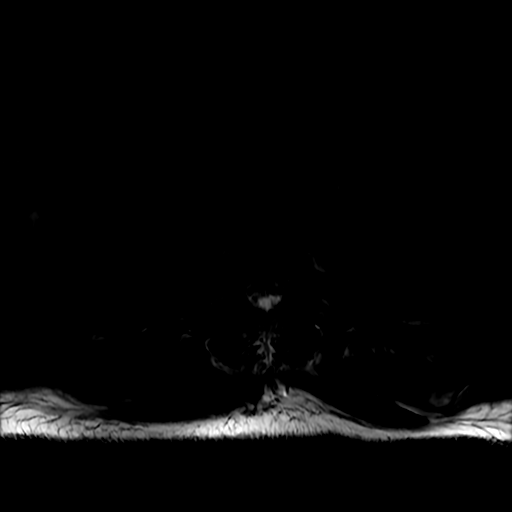
[im 49/49]
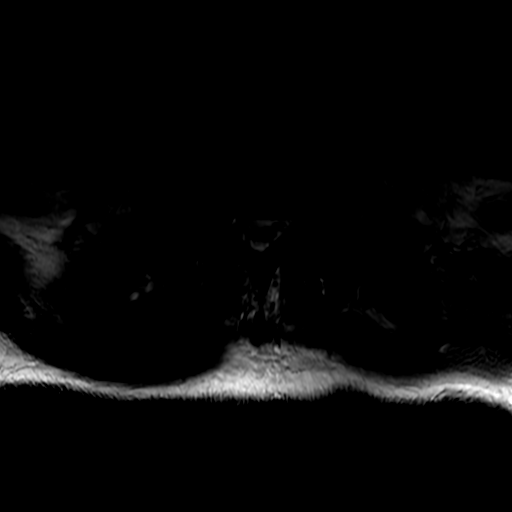

[Series 15: T1 · axial · non-contrast · 4.0mm · 0.39mm/px · 1 of 49 slices shown (3 of 3)]
[im 1/49]
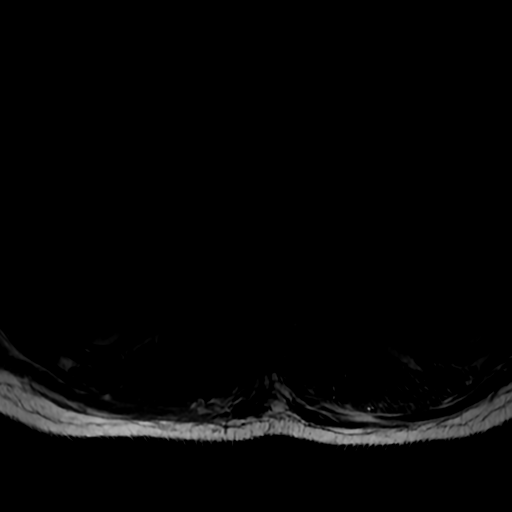

[18 of 48 positions shown; findings below may reference images not displayed]

FINDINGS: MRI CERVICAL SPINE FINDINGS

Alignment: Physiologic.

Vertebrae: No fracture, evidence of discitis, or bone lesion.

Cord: Hydrosyringomyelia of the upper cervical spinal cord from
C2-C4, measuring 6 mm in greatest transverse dimension. There is
hyperintense T2-weighted signal within the dorsal columns of the
spinal cord at the C5-6 levels.

Posterior Fossa, vertebral arteries, paraspinal tissues: Negative

Disc levels:

C2-3: Unremarkable.

C3-4: Small right subarticular disc protrusion with mild right
foraminal stenosis.

C4-5: Small disc bulge with uncovertebral hypertrophy. Moderate
spinal canal stenosis with flattening of the spinal cord.

C5-6: Left asymmetric disc bulge with uncovertebral hypertrophy.
Moderate left foraminal stenosis and mild spinal canal stenosis.

C6-7: No disc herniation or stenosis.

MRI THORACIC SPINE FINDINGS

Alignment:  Physiologic.

Vertebrae: No fracture, evidence of discitis, or bone lesion.

Cord: Hyperintense T2-weighted signal within the dorsal columns
within most of the lower thoracic spine, though detailed assessment
is limited by motion.

Paraspinal and other soft tissues: Negative

Disc levels:

No spinal canal stenosis.

MRI LUMBAR SPINE FINDINGS

Segmentation:  Standard.

Alignment:  Physiologic.

Vertebrae:  No fracture, evidence of discitis, or bone lesion.

Conus medullaris and cauda equina: Conus extends to the L2 level.
Conus and cauda equina appear normal.

Paraspinal and other soft tissues: Negative

Disc levels:

Small disc bulge at L4-5 with mild narrowing of the lateral
recesses. The other lumbar disc levels are unremarkable.

No abnormal contrast enhancement.
IMPRESSION: 1. Hydrosyringomyelia of the upper cervical spinal cord from C2-C4
measuring up to 6mm in transverse diameter. Unclear etiology.
2. Hyperintense T2-weighted signal lesions of the dorsal columns of
the spinal cord at the C5-6 levels and throughout the lower thoracic
spinal cord, most consistent with subacute combined degeneration (as
may be seen in B12 deficiency).
3. Cervical degenerative disc disease with moderate spinal canal
stenosis at C4-5 and mild spinal canal stenosis at C5-6.
4. Mild narrowing of the lateral recesses at L4-5.

## 2020-08-30 MED ORDER — GADOBUTROL 1 MMOL/ML IV SOLN: 7.5000 mL | Freq: Once | INTRAVENOUS | Status: DC | PRN

## 2020-08-30 MED ORDER — GADOBUTROL 1 MMOL/ML IV SOLN
7.5000 mL | Freq: Once | INTRAVENOUS | Status: AC | PRN
  Administered 2020-08-30: 7.5 mL via INTRAVENOUS

## 2020-08-30 NOTE — ED Provider Notes (Signed)
Patient transferred from [facility] for MRI evaluation for leg numbness and weakness.  According to documentation, she has had some abnormal symptoms for the last 3 weeks.  After the previous team spoke to neurology, they recommended MRI of her back.  Patient was transferred here for MRI.  If there is a surgical problem, plan to call neurosurgery.  If there is no neurosurgical abnormality, plan was to reconsult neurology.  Anticipate reassessment.  On my initial evaluation, patient resting comfortably without significant pain.  Care transferred to oncoming team while awaiting results of MRI.   Lorrene Graef, Canary Brim, MD 08/31/20 0040

## 2020-08-30 NOTE — ED Notes (Signed)
IV tegaderm noted to be pulled back from IV and tape dislodged, replaced dressing, flushed IV but unable to flush.  IV infiltrated.

## 2020-08-30 NOTE — ED Notes (Signed)
Pt bib [facility] from [facility] for spinal MRI. Pt reports having bilateral lower extremity weakness x3 weeks but worsened the past 2 days. 20g LAC on arrival.

## 2020-08-30 NOTE — ED Notes (Signed)
Patient transported to MRI 

## 2020-08-30 NOTE — ED Notes (Signed)
Patient transported to CT 

## 2020-08-30 NOTE — ED Provider Notes (Signed)
I personally evaluated the patient during the encounter and completed a history, physical, procedures, medical decision making to contribute to the overall care of the patient and decision making for the patient briefly, the patient is a 65 y.o. female is here with difficulty ambulating.  Normal vitals.  No fever.  States about 2 to 3 weeks ago she started to develop difficulty walking.  At that time she was started on gabapentin.  Gradually over the last 3 weeks she can even stand on her own 2 feet.  She has a sensation that she is got a fall when she even tries to stand up.  Over the last week she was able to do some transfers with her walker that was recently purchased but the last several days she states that she can even stand.  She has normal strength and sensation on exam.  She has no back pain or pain anywhere.  She has no symptoms that would be concerning for cauda equina such as loss of bowel or bladder, no urinary retention, no saddle anesthesia.  She appears to struggle with coordination in her lower legs with abnormal heel-to-shin bilaterally.  When I try to get her up to stand to walk she becomes very anxious and her feet just never really get underneath her.  Reflexes appear to be unremarkable.  CT of the head was unremarkable.  Lumbar CT showed some multilevel spinal stenosis.  Overall I have low suspicion for cauda equina but more concern for transverse myelitis or other type of myelopathy.  Does not appear to be consistent with a Guillain-Barr.  Talk to Dr. Amada Jupiter with neurology and will send her over to [facility] for MRI of her spine.  Hemodynamically stable otherwise.  This chart was dictated using voice recognition software.  Despite best efforts to proofread,  errors can occur which can change the documentation meaning.    EKG Interpretation None            Virgina Norfolk, DO 08/30/20 1811

## 2020-08-30 NOTE — ED Notes (Signed)
[facility] called for report eta 20 min

## 2020-08-30 NOTE — ED Provider Notes (Signed)
   6:15 PM  Patient signed out to me as ED to ED transfer from [facility].   Patient is a 65 yo female being transferred from [facility] for MRI lumbar spine as advised by neurologist Dr. Amada Jupiter to r/o transverse myelitis. Patient presented to ED for bilateral LE weakness. Baseline can walk without difficulties. No hx of falls or injuries. CT lumbar spine at [facility] stable.    Franne Forts, DO 08/30/20 [phone]

## 2020-08-30 NOTE — ED Triage Notes (Signed)
Started gabapentin 2 weeks ago for leg numbness, starting 2 days ago "feeling jumpy and feels like she is going to fall out of bed"  unable to ambulate r/t leg weakness. Denies pain

## 2020-08-30 NOTE — ED Provider Notes (Signed)
[facility] EMERGENCY DEPARTMENT Provider Note   CSN: [id number] Arrival date & time: 08/30/20  1553     History Chief Complaint  Patient presents with   Extremity Weakness    Diane Willis is a 65 y.o. female presenting for evaluation of lower extremity numbness and weakness.  Patient states she has had an abnormal sensation of both legs for the past 3 weeks.  She saw her PCP, who started her on gabapentin 2 weeks ago.  She has not noticed any improvement, and states that the medication makes her very sleepy.  She recently got a walker to try and help get around, as due to the abnormal sensation as she is having difficulty walking.  Today however she was unable to walk at all, unable to stand as her legs just give out.  She does not report any pain including back pain.  No history of back problems.  She denies fall, trauma, or injury.  She denies fevers, chills, history of cancer or history of IVDU.  No loss of bladder control.  She does report she has had issues with constipation recently, although these are improving.  No previous back surgeries.  Reports a history of diabetes and hypertension, no other medical problems. BGLs have been "up and down" recently.   HPI     Past Medical History:  Diagnosis Date   COVID-19 04/2012   Diabetes mellitus without complication (HCC)    Hypertension     Patient Active Problem List   Diagnosis Date Noted   Anemia 04/28/2019   Hypertension 04/28/2019   ARF (acute renal failure) (HCC) 04/28/2019   Acute respiratory failure with hypoxia (HCC) 04/28/2019   Acute hypoxemic respiratory failure due to COVID-19 [facility]) 04/27/2019   Bilateral pulmonary embolism (HCC) 04/27/2019    Past Surgical History:  Procedure Laterality Date   ABDOMINAL HYSTERECTOMY       OB History   No obstetric history on file.     Family History  Problem Relation Age of Onset   Healthy Mother    COPD Father    Healthy Sister    HIV Brother     Healthy Sister    HIV Sister    HIV Brother    HIV Brother    HIV Brother    HIV Brother    CAD Neg Hx    Diabetes Mellitus II Neg Hx     Social History   Tobacco Use   Smoking status: Never   Smokeless tobacco: Never  Vaping Use   Vaping Use: Never used  Substance Use Topics   Alcohol use: No   Drug use: No    Home Medications Prior to Admission medications   Medication Sig Start Date End Date Taking? Authorizing Provider  ALPRAZolam (XANAX) 0.25 MG tablet Take 1 tablet (0.25 mg total) by mouth 3 (three) times daily as needed for anxiety. 04/30/19   Ghimire, Werner Lean, MD  amLODipine (NORVASC) 10 MG tablet Take 10 mg by mouth every morning. 04/09/19   [provider]  dexamethasone (DECADRON) 6 MG tablet Take 1 tablet (6 mg total) by mouth daily. 04/30/19   Ghimire, Werner Lean, MD  docusate sodium (COLACE) 250 MG capsule Take 1 capsule (250 mg total) by mouth daily. 08/13/20   Tanda Rockers, PA-C  hydrocortisone (ANUSOL-HC) 2.5 % rectal cream Place 1 application rectally 2 (two) times daily. 08/18/20   Theron Arista, PA-C  hydrOXYzine (ATARAX/VISTARIL) 25 MG tablet Take 1 tablet (25 mg total) by  mouth every 6 (six) hours as needed for anxiety. 05/03/19   Horton, Mayer Masker, MD  insulin aspart protamine- aspart (NOVOLOG MIX 70/30) (70-30) 100 UNIT/ML injection Inject 0.18-0.35 mLs (18-35 Units total) into the skin See admin instructions. Inject 35 units in the morning and 18 units at night 04/30/19   Ghimire, Werner Lean, MD  lidocaine (XYLOCAINE) 2 % jelly Apply 1 application topically as needed. 08/13/20   Hyman Hopes, Margaux, PA-C  metFORMIN (GLUCOPHAGE) 500 MG tablet Take 1 tablet (500 mg total) by mouth 2 (two) times daily. 05/01/19   Ghimire, Werner Lean, MD  Nitroglycerin 0.4 % OINT After cleansing apply twice daily to rectum for 4 weeks 08/13/20   Tanda Rockers, PA-C  pantoprazole (PROTONIX) 40 MG tablet Take 1 tablet (40 mg total) by mouth daily at 12 noon. 04/30/19   Ghimire, Werner Lean, MD  rivaroxaban (XARELTO) 20 MG TABS tablet Take 1 tablet (20 mg total) by mouth daily with supper. 05/21/19   Ghimire, Werner Lean, MD  simvastatin (ZOCOR) 40 MG tablet Take 40 mg by mouth at bedtime. 02/06/19   [provider]  trolamine salicylate (ASPERCREME/ALOE) 10 % cream Apply 1 application topically as needed for muscle pain. 08/18/20   Theron Arista, PA-C    Allergies    Patient has no known allergies.  Review of Systems   Review of Systems  Gastrointestinal:  Positive for constipation.  Musculoskeletal:  Positive for gait problem.  Neurological:  Positive for weakness and numbness.  All other systems reviewed and are negative.  Physical Exam Updated Vital Signs BP 138/66 (BP Location: Left Arm)   Pulse 71   Temp 99.7 F (37.6 C) (Oral)   Resp 18   Ht 5\' 1"  (1.549 m)   Wt 76.2 kg   SpO2 100%   BMI 31.74 kg/m   Physical Exam Vitals and nursing note reviewed. Exam conducted with a chaperone present.  Constitutional:      General: She is not in acute distress.    Appearance: Normal appearance.     Comments: Resting in the bed in no acute distress  HENT:     Head: Normocephalic and atraumatic.  Eyes:     Conjunctiva/sclera: Conjunctivae normal.     Pupils: Pupils are equal, round, and reactive to light.  Cardiovascular:     Rate and Rhythm: Normal rate and regular rhythm.     Pulses: Normal pulses.  Pulmonary:     Effort: Pulmonary effort is normal. No respiratory distress.     Breath sounds: Normal breath sounds. No wheezing.     Comments: Speaking in full sentences.  Clear lung sounds in all fields. Abdominal:     General: There is no distension.     Palpations: Abdomen is soft. There is no mass.     Tenderness: There is no abdominal tenderness. There is no guarding or rebound.  Genitourinary:    Comments: Good rectal tone. No bleeding. No mass.  Musculoskeletal:        General: No tenderness. Normal range of motion.     Cervical back: Normal range of  motion and neck supple.     Comments: No ttp of the back  Skin:    General: Skin is warm and dry.     Capillary Refill: Capillary refill takes less than 2 seconds.  Neurological:     Mental Status: She is alert and oriented to person, place, and time.     GCS: GCS eye subscore is 4. GCS verbal  subscore is 5. GCS motor subscore is 6.     Sensory: Sensation is intact.     Comments: Good sensation on my evaluation.  Pedal pulses 2+.  No saddle anesthesia.  Patellar reflexes 2+ bilaterally.  Patient able to flex and extend at the ankle and knee without difficulty.  Able to perform straight leg raise off the bed without difficulty.  However when trying to stand up off the bed or ambulate, patient is unable to do so as she is unable to bear weight. Appears almost as if her legs are spasming or jerking. ?coordination issue  Psychiatric:        Mood and Affect: Mood and affect normal.        Speech: Speech normal.        Behavior: Behavior normal.    ED Results / Procedures / Treatments   Labs (all labs ordered are listed, but only abnormal results are displayed) Labs Reviewed  COMPREHENSIVE METABOLIC PANEL - Abnormal; Notable for the following components:      Result Value   Potassium 3.4 (*)    Glucose, Bld 135 (*)    BUN 30 (*)    Creatinine, Ser 1.14 (*)    GFR, Estimated 54 (*)    All other components within normal limits  RESP PANEL BY RT-PCR (FLU A&B, COVID) ARPGX2  CBC WITH DIFFERENTIAL/PLATELET  URINALYSIS, ROUTINE W REFLEX MICROSCOPIC  RAPID URINE DRUG SCREEN, HOSP PERFORMED    EKG None  Radiology CT HEAD WO CONTRAST (5MM)  Result Date: 08/30/2020 CLINICAL DATA:  Low back pain and extremity weakness, initial encounter EXAM: CT HEAD WITHOUT CONTRAST TECHNIQUE: Contiguous axial images were obtained from the base of the skull through the vertex without intravenous contrast. COMPARISON:  None. FINDINGS: Brain: No evidence of acute infarction, hemorrhage, hydrocephalus,  extra-axial collection or mass lesion/mass effect. Mild atrophic changes are noted. Mild basal ganglia calcifications are seen as well. Vascular: No hyperdense vessel or unexpected calcification. Skull: Normal. Negative for fracture or focal lesion. Sinuses/Orbits: No acute finding. Other: None. IMPRESSION: Chronic atrophic changes without acute abnormality. Electronically Signed   By: Alcide CleverMark  Lukens M.D.   On: 08/30/2020 17:29   CT Lumbar Spine Wo Contrast  Result Date: 08/30/2020 CLINICAL DATA:  Low back pain, cauda equina syndrome suspected. Additional history provided: Extremity weakness, motor neuron disease. EXAM: CT LUMBAR SPINE WITHOUT CONTRAST TECHNIQUE: Multidetector CT imaging of the lumbar spine was performed without intravenous contrast administration. Multiplanar CT image reconstructions were also generated. COMPARISON:  None FINDINGS: Segmentation: 5 lumbar vertebrae. The caudal most well-formed intervertebral disc space is designated Alignment: No significant spondylolisthesis. Vertebrae: Vertebral body height is maintained. No evidence of fracture to the lumbar spine. Paraspinal and other soft tissues: Paraspinal soft tissues unremarkable. Aortoiliac atherosclerosis. Disc levels: No more than mild disc space narrowing at any level. T10-T11: Disc bulge. Facet arthrosis and ligamentum flavum calcification. No appreciable significant spinal canal stenosis. The neural foramina are incompletely included in the field of view. T11-T12: Disc bulge. Facet arthrosis and ligamentum flavum calcification. No appreciable significant spinal canal stenosis. Moderate left neural foraminal narrowing. T12-L1: Disc bulge. Mild facet arthrosis and ligamentum flavum calcification. No appreciable significant spinal canal stenosis or neural foraminal narrowing. L1-L2: Disc bulge. Facet arthrosis with ligamentum flavum hypertrophy. No appreciable significant spinal canal stenosis or neural foraminal narrowing. L2-L3: Disc  bulge. Facet arthrosis with ligamentum flavum hypertrophy. Suspected at least moderate spinal canal stenosis. Mild left neural foraminal narrowing. L3-L4: Disc bulge. Facet arthrosis with ligamentum  flavum hypertrophy. Suspected at least moderate spinal canal stenosis. Bilateral neural foraminal narrowing (mild right, moderate/severe left). L4-L5: Disc bulge. Facet arthrosis with ligamentum flavum hypertrophy. Suspected at least moderate spinal canal stenosis. Bilateral neural foraminal narrowing (mild right, moderate left). L5-S1: Disc bulge. Facet arthrosis with ligamentum flavum hypertrophy. No appreciable significant spinal canal stenosis or neural foraminal narrowing. IMPRESSION: Lumbar spondylosis, as outlined. Multilevel spinal canal stenosis. Most notably, there is suspected at least moderate spinal canal stenosis at L2-L3, L3-L4 and L4-L5. Given the clinical concern for cauda equina syndrome, an MRI should be considered for further evaluation if the patient is able to have one. Multilevel foraminal stenosis, as detailed and greatest on the left at T11-T12 (moderate), on the left at L3-L4 (moderate/severe) and on the left at L4-L5 (moderate). Aortic Atherosclerosis (ICD10-I70.0). Electronically Signed   By: Jackey Loge D.O.   On: 08/30/2020 17:37    Procedures Procedures   Medications Ordered in ED Medications - No data to display  ED Course  I have reviewed the triage vital signs and the nursing notes.  Pertinent labs & imaging results that were available during my care of the patient were reviewed by me and considered in my medical decision making (see chart for details).    MDM Rules/Calculators/A&P                           Patient presenting for evaluation of bilateral leg weakness.  On exam, patient appears nontoxic.  No signs of infection and that patient is afebrile and reports no infectious symptoms.  No trauma or injury.  However patient is unable to ambulate or bear weight.   Unfortunately MRI is not available at this facility, as such we will obtain CT head and lumbar spine for further evaluation.  Will obtain basic labs.  Rectal tone and reflexes are reassuring, lower suspicion for cauda equina syndrome.  Consider transverse myelopathy versus compression.  CT head negative for acute findings.  CT lumbar spine shows stenosis, however no clear cause for patient's symptoms.  Discussed with Dr. Amada Jupiter from neurology who recommends MRI of the C and T-spine.  Recommends transfer to [facility] to get these images.  If there is something surgical, call consult neurosurgery.  If there is no signs of surgical or scans are negative, recommends reconsultation when patient is at [facility]  Discussed with Dr. Leanna Sato from [facility] ED who accepts patient for transfer.  Will plan for ED to ED transfer.   Final Clinical Impression(s) / ED Diagnoses Final diagnoses:  None    Rx / DC Orders ED Discharge Orders     None        Alveria Apley, PA-C 08/30/20 1901    Virgina Norfolk, DO 08/30/20 2207

## 2020-08-30 NOTE — ED Notes (Signed)
Report called to [facility], RN at [facility].  Pt stable for transfer.  [facility] at bedside

## 2020-08-30 NOTE — ED Notes (Signed)
[facility] TRANSPORT TEAM PHONED, SPOKE WITH DOUG IN COMMUNICATIONS, TRANSPORT REQUEST MADE FROM [facility] ED TO [place] FOR MRI

## 2020-08-31 ENCOUNTER — Encounter (HOSPITAL_COMMUNITY): Payer: Self-pay | Admitting: Internal Medicine

## 2020-08-31 DIAGNOSIS — I129 Hypertensive chronic kidney disease with stage 1 through stage 4 chronic kidney disease, or unspecified chronic kidney disease: Secondary | ICD-10-CM | POA: Diagnosis present

## 2020-08-31 DIAGNOSIS — E538 Deficiency of other specified B group vitamins: Secondary | ICD-10-CM | POA: Diagnosis not present

## 2020-08-31 DIAGNOSIS — Z8616 Personal history of COVID-19: Secondary | ICD-10-CM | POA: Diagnosis not present

## 2020-08-31 DIAGNOSIS — N319 Neuromuscular dysfunction of bladder, unspecified: Secondary | ICD-10-CM | POA: Diagnosis not present

## 2020-08-31 DIAGNOSIS — R7989 Other specified abnormal findings of blood chemistry: Secondary | ICD-10-CM | POA: Diagnosis not present

## 2020-08-31 DIAGNOSIS — E119 Type 2 diabetes mellitus without complications: Secondary | ICD-10-CM | POA: Diagnosis not present

## 2020-08-31 DIAGNOSIS — Z794 Long term (current) use of insulin: Secondary | ICD-10-CM | POA: Diagnosis not present

## 2020-08-31 DIAGNOSIS — G950 Syringomyelia and syringobulbia: Secondary | ICD-10-CM | POA: Diagnosis present

## 2020-08-31 DIAGNOSIS — Z86711 Personal history of pulmonary embolism: Secondary | ICD-10-CM | POA: Diagnosis not present

## 2020-08-31 DIAGNOSIS — F419 Anxiety disorder, unspecified: Secondary | ICD-10-CM | POA: Diagnosis present

## 2020-08-31 DIAGNOSIS — R270 Ataxia, unspecified: Secondary | ICD-10-CM | POA: Diagnosis present

## 2020-08-31 DIAGNOSIS — E1122 Type 2 diabetes mellitus with diabetic chronic kidney disease: Secondary | ICD-10-CM | POA: Diagnosis present

## 2020-08-31 DIAGNOSIS — N179 Acute kidney failure, unspecified: Secondary | ICD-10-CM | POA: Diagnosis not present

## 2020-08-31 DIAGNOSIS — R0989 Other specified symptoms and signs involving the circulatory and respiratory systems: Secondary | ICD-10-CM | POA: Diagnosis not present

## 2020-08-31 DIAGNOSIS — E1165 Type 2 diabetes mellitus with hyperglycemia: Secondary | ICD-10-CM

## 2020-08-31 DIAGNOSIS — Z79899 Other long term (current) drug therapy: Secondary | ICD-10-CM | POA: Diagnosis not present

## 2020-08-31 DIAGNOSIS — K592 Neurogenic bowel, not elsewhere classified: Secondary | ICD-10-CM | POA: Diagnosis not present

## 2020-08-31 DIAGNOSIS — D6959 Other secondary thrombocytopenia: Secondary | ICD-10-CM | POA: Diagnosis present

## 2020-08-31 DIAGNOSIS — Z90710 Acquired absence of both cervix and uterus: Secondary | ICD-10-CM | POA: Diagnosis not present

## 2020-08-31 DIAGNOSIS — E785 Hyperlipidemia, unspecified: Secondary | ICD-10-CM | POA: Diagnosis present

## 2020-08-31 DIAGNOSIS — Z20822 Contact with and (suspected) exposure to covid-19: Secondary | ICD-10-CM | POA: Diagnosis present

## 2020-08-31 DIAGNOSIS — E519 Thiamine deficiency, unspecified: Secondary | ICD-10-CM | POA: Diagnosis present

## 2020-08-31 DIAGNOSIS — D510 Vitamin B12 deficiency anemia due to intrinsic factor deficiency: Secondary | ICD-10-CM | POA: Diagnosis present

## 2020-08-31 DIAGNOSIS — R200 Anesthesia of skin: Secondary | ICD-10-CM | POA: Diagnosis present

## 2020-08-31 DIAGNOSIS — Z7901 Long term (current) use of anticoagulants: Secondary | ICD-10-CM | POA: Diagnosis not present

## 2020-08-31 DIAGNOSIS — Z7982 Long term (current) use of aspirin: Secondary | ICD-10-CM | POA: Diagnosis not present

## 2020-08-31 DIAGNOSIS — G378 Other specified demyelinating diseases of central nervous system: Secondary | ICD-10-CM | POA: Diagnosis present

## 2020-08-31 DIAGNOSIS — F418 Other specified anxiety disorders: Secondary | ICD-10-CM | POA: Diagnosis not present

## 2020-08-31 DIAGNOSIS — D539 Nutritional anemia, unspecified: Secondary | ICD-10-CM | POA: Diagnosis not present

## 2020-08-31 DIAGNOSIS — N182 Chronic kidney disease, stage 2 (mild): Secondary | ICD-10-CM | POA: Diagnosis present

## 2020-08-31 DIAGNOSIS — R7309 Other abnormal glucose: Secondary | ICD-10-CM | POA: Diagnosis not present

## 2020-08-31 DIAGNOSIS — Z825 Family history of asthma and other chronic lower respiratory diseases: Secondary | ICD-10-CM | POA: Diagnosis not present

## 2020-08-31 DIAGNOSIS — G320 Subacute combined degeneration of spinal cord in diseases classified elsewhere: Secondary | ICD-10-CM | POA: Diagnosis present

## 2020-08-31 DIAGNOSIS — R532 Functional quadriplegia: Secondary | ICD-10-CM | POA: Diagnosis present

## 2020-08-31 LAB — GLUCOSE, CAPILLARY: Glucose-Capillary: 227 mg/dL — ABNORMAL HIGH (ref 70–99)

## 2020-08-31 LAB — FOLATE: Folate: 50.5 ng/mL (ref >5.9)

## 2020-08-31 LAB — RAPID HIV SCREEN (HIV 1/2 AB+AG)
HIV 1/2 Antibodies: NONREACTIVE
HIV-1 P24 Antigen - HIV24: NONREACTIVE
Interpretation (HIV Ag Ab): NONREACTIVE

## 2020-08-31 LAB — CBG MONITORING, ED
Glucose-Capillary: 168 mg/dL — ABNORMAL HIGH (ref 70–99)
Glucose-Capillary: 224 mg/dL — ABNORMAL HIGH (ref 70–99)
Glucose-Capillary: 211 mg/dL — ABNORMAL HIGH (ref 70–99)
Glucose-Capillary: 287 mg/dL — ABNORMAL HIGH (ref 70–99)

## 2020-08-31 LAB — HEMOGLOBIN A1C
Hgb A1c MFr Bld: 7.8 % — ABNORMAL HIGH (ref 4.8–5.6)
Mean Plasma Glucose: 177.16 mg/dL

## 2020-08-31 LAB — RPR: RPR Ser Ql: NONREACTIVE

## 2020-08-31 LAB — TSH: TSH: 1.693 u[IU]/mL (ref 0.350–4.500)

## 2020-08-31 LAB — VITAMIN B12: Vitamin B-12: <50 pg/mL — ABNORMAL LOW (ref 180–914)

## 2020-08-31 LAB — MAGNESIUM: Magnesium: 2 mg/dL (ref 1.7–2.4)

## 2020-08-31 MED ORDER — CYANOCOBALAMIN 1000 MCG/ML IJ SOLN
1000.0000 ug | Freq: Every day | INTRAMUSCULAR | Status: AC
  Administered 2020-08-31 – 2020-09-06 (×7): 1000 ug via INTRAMUSCULAR
  Filled 2020-08-31 (×7): qty 1

## 2020-08-31 MED ORDER — DOCUSATE SODIUM 100 MG PO CAPS
100.0000 mg | ORAL_CAPSULE | Freq: Two times a day (BID) | ORAL | Status: DC | PRN
  Administered 2020-08-31: 100 mg via ORAL
  Filled 2020-08-31: qty 1

## 2020-08-31 MED ORDER — SIMVASTATIN 20 MG PO TABS
40.0000 mg | ORAL_TABLET | Freq: Every day | ORAL | Status: DC
  Administered 2020-08-31 – 2020-09-05 (×6): 40 mg via ORAL
  Filled 2020-08-31 (×6): qty 2

## 2020-08-31 MED ORDER — ASPIRIN EC 81 MG PO TBEC
81.0000 mg | DELAYED_RELEASE_TABLET | Freq: Every day | ORAL | Status: DC
  Administered 2020-08-31 – 2020-09-06 (×7): 81 mg via ORAL
  Filled 2020-08-31 (×7): qty 1

## 2020-08-31 MED ORDER — ACETAMINOPHEN 650 MG RE SUPP: 650.0000 mg | Freq: Four times a day (QID) | RECTAL | Status: DC | PRN

## 2020-08-31 MED ORDER — THIAMINE HCL 100 MG PO TABS
100.0000 mg | ORAL_TABLET | Freq: Every day | ORAL | Status: DC
  Administered 2020-09-01 – 2020-09-06 (×6): 100 mg via ORAL
  Filled 2020-08-31 (×6): qty 1

## 2020-08-31 MED ORDER — POTASSIUM CHLORIDE CRYS ER 20 MEQ PO TBCR
20.0000 meq | EXTENDED_RELEASE_TABLET | Freq: Once | ORAL | Status: AC
  Administered 2020-08-31: 20 meq via ORAL
  Filled 2020-08-31: qty 1

## 2020-08-31 MED ORDER — ALPRAZOLAM 0.25 MG PO TABS
0.2500 mg | ORAL_TABLET | Freq: Three times a day (TID) | ORAL | Status: DC | PRN
  Administered 2020-09-05: 0.25 mg via ORAL
  Filled 2020-08-31 (×2): qty 1

## 2020-08-31 MED ORDER — HYDROXYZINE HCL 25 MG PO TABS: 25.0000 mg | ORAL_TABLET | Freq: Four times a day (QID) | ORAL | Status: DC | PRN

## 2020-08-31 MED ORDER — INSULIN ASPART PROT & ASPART (70-30 MIX) 100 UNIT/ML ~~LOC~~ SUSP
20.0000 [IU] | Freq: Two times a day (BID) | SUBCUTANEOUS | Status: DC
  Filled 2020-08-31 (×2): qty 10

## 2020-08-31 MED ORDER — THIAMINE HCL 100 MG/ML IJ SOLN
100.0000 mg | Freq: Every day | INTRAMUSCULAR | Status: DC
  Administered 2020-08-31: 100 mg via INTRAVENOUS
  Filled 2020-08-31: qty 2

## 2020-08-31 MED ORDER — ACETAMINOPHEN 325 MG PO TABS
650.0000 mg | ORAL_TABLET | Freq: Four times a day (QID) | ORAL | Status: DC | PRN
  Administered 2020-09-01: 650 mg via ORAL
  Filled 2020-08-31: qty 2

## 2020-08-31 MED ORDER — HYDRALAZINE HCL 20 MG/ML IJ SOLN: 10.0000 mg | INTRAMUSCULAR | Status: DC | PRN

## 2020-08-31 MED ORDER — LISINOPRIL 20 MG PO TABS
20.0000 mg | ORAL_TABLET | Freq: Two times a day (BID) | ORAL | Status: DC
  Administered 2020-08-31 – 2020-09-01 (×4): 20 mg via ORAL
  Filled 2020-08-31 (×4): qty 1

## 2020-08-31 MED ORDER — CYANOCOBALAMIN 1000 MCG/ML IJ SOLN
1000.0000 ug | INTRAMUSCULAR | Status: DC
Start: 2020-09-07 — End: 2020-09-06

## 2020-08-31 MED ORDER — INSULIN ASPART 100 UNIT/ML IJ SOLN
0.0000 [IU] | Freq: Three times a day (TID) | INTRAMUSCULAR | Status: DC
  Administered 2020-08-31: 3 [IU] via SUBCUTANEOUS
  Administered 2020-08-31: 5 [IU] via SUBCUTANEOUS
  Administered 2020-08-31: 3 [IU] via SUBCUTANEOUS
  Administered 2020-09-01: 15 [IU] via SUBCUTANEOUS
  Administered 2020-09-01: 9 [IU] via SUBCUTANEOUS
  Administered 2020-09-02: 1 [IU] via SUBCUTANEOUS
  Administered 2020-09-02 – 2020-09-03 (×3): 2 [IU] via SUBCUTANEOUS
  Administered 2020-09-03: 7 [IU] via SUBCUTANEOUS
  Administered 2020-09-03: 2 [IU] via SUBCUTANEOUS
  Administered 2020-09-04: 9 [IU] via SUBCUTANEOUS
  Administered 2020-09-04: 2 [IU] via SUBCUTANEOUS
  Administered 2020-09-04: 3 [IU] via SUBCUTANEOUS
  Administered 2020-09-05: 5 [IU] via SUBCUTANEOUS
  Administered 2020-09-05: 9 [IU] via SUBCUTANEOUS
  Administered 2020-09-05: 1 [IU] via SUBCUTANEOUS

## 2020-08-31 MED ORDER — AMLODIPINE BESYLATE 10 MG PO TABS
10.0000 mg | ORAL_TABLET | Freq: Every morning | ORAL | Status: DC
  Administered 2020-08-31 – 2020-09-01 (×2): 10 mg via ORAL
  Filled 2020-08-31: qty 1
  Filled 2020-08-31: qty 2

## 2020-08-31 NOTE — Progress Notes (Signed)
64-year with history of DM2, HTN, HLD admitted for progressive bilateral lower extremity weakness, numbness and tingling for the past 3 weeks.  Patient underwent extensive work-up including CT head and MRI spine which showed some demyelination of her spine consistent with finding of vitamin B12 deficiency.  Started on B12 supplements.  Neurology team was following.  Seen and examined at bedside, no complaints  Vital signs are overall stable.  Vitamin B12 deficiency Bilateral lower extremity weakness, numbness and tingling - Start patient on aggressive vitamin B12 supplements.  Check antibody intrinsic factor to rule out pernicious anemia.  Supportive care. -THC-normal, RPR-nonreactive, A1c, 7.8, HIV-negative  Macrocytic anemia/Thrombocytopenia-secondary to B12 deficiency  Essential hypertension-on lisinopril.  HCTZ on hold  Insulin-dependent diabetes mellitus type 2-sliding scale and Accu-Chek  Hyperlipidemia-statin  AKI on CKD stage II-Baseline Cr 1.1. Admit Cr 1.5  Stephania Fragmin MD [facility]

## 2020-08-31 NOTE — ED Provider Notes (Signed)
Received patient in signout from Dr. Rush Landmark.  Briefly the patient is been having progressive lower extremity weakness and difficulty with ambulation over the past few weeks.  Found on MRI to have findings concerning for subacute combined degeneration.  Discussed with neurology, Dr. Derry Lory, will come and evaluate the patient at bedside recommended hospitalist admission.   Melene Plan, DO 08/31/20 [phone]

## 2020-08-31 NOTE — H&P (Addendum)
History and Physical    Diane Willis ZOX:[id number] DOB: 1955/11/11 DOA: 08/30/2020  PCP: Fleet Contras, MD  Patient coming from: Home.  Chief Complaint: Weakness of the lower extremities.  HPI: Diane Willis is a 65 y.o. female with history of diabetes mellitus type 2 on insulin, hypertension, hyperlipidemia presents to the ER after patient has been having lower extremity weakness.  Patient states her symptoms started about 3 weeks ago when patient started having tingling and numbness of the lower extremity.  Patient's primary care placed her on gabapentin.  Despite taking which patient's symptoms progressed.  Over the last couple of days patient has been having weakness very and patient is finding it difficult to walk.  Over the last week or so patient has been using walker to walk.  Denies any weakness of the upper extremity.  Denies any difficulty swallowing or speaking or any visual symptoms.  No incontinence of urine or bowel.  Patient was admitted last year for COVID infection at that time patient also had a pulmonary embolism.  Presently off anticoagulants.  ED Course: In the ER on exam patient has weakness of the lower extremity but is able to move the extremities.  Has poor sensation for vibration on the lower extremity.  Patient had CT head followed by MRI of the spine which shows features concerning for subacute combined degeneration of the cord for which neurology has been consulted.  Patient's lab show macrocytic anemia with hemoglobin of 9.7.  Creatinine 1.1 potassium 3.4.  COVID test negative.  Review of Systems: As per HPI, rest all negative.   Past Medical History:  Diagnosis Date   COVID-19 04/2012   Diabetes mellitus without complication (HCC)    Hypertension     Past Surgical History:  Procedure Laterality Date   ABDOMINAL HYSTERECTOMY       reports that she has never smoked. She has never used smokeless tobacco. She reports that she does not drink alcohol  and does not use drugs.  No Known Allergies  Family History  Problem Relation Age of Onset   Healthy Mother    COPD Father    Healthy Sister    HIV Brother    Healthy Sister    HIV Sister    HIV Brother    HIV Brother    HIV Brother    HIV Brother    CAD Neg Hx    Diabetes Mellitus II Neg Hx     Prior to Admission medications   Medication Sig Start Date End Date Taking? Authorizing Provider  acetaminophen (TYLENOL) 325 MG tablet Take 325-650 mg by mouth every 6 (six) hours as needed for moderate pain or headache.   Yes [provider]  ALPRAZolam (XANAX) 0.25 MG tablet Take 1 tablet (0.25 mg total) by mouth 3 (three) times daily as needed for anxiety. 04/30/19  Yes Ghimire, Werner Lean, MD  amLODipine (NORVASC) 10 MG tablet Take 10 mg by mouth every morning. 04/09/19  Yes [provider]  aspirin EC 81 MG tablet Take 81 mg by mouth daily. Swallow whole.   Yes [provider]  docusate sodium (COLACE) 250 MG capsule Take 1 capsule (250 mg total) by mouth daily. Patient taking differently: Take 250 mg by mouth daily as needed for constipation. 08/13/20  Yes Venter, Margaux, PA-C  famotidine (PEPCID) 20 MG tablet Take 20 mg by mouth daily as needed for heartburn or indigestion. 08/09/20  Yes [provider]  hydrocortisone (ANUSOL-HC) 2.5 % rectal cream  Place 1 application rectally 2 (two) times daily. 08/18/20  Yes Theron Arista, PA-C  hydrOXYzine (ATARAX/VISTARIL) 25 MG tablet Take 1 tablet (25 mg total) by mouth every 6 (six) hours as needed for anxiety. 05/03/19  Yes Horton, Mayer Masker, MD  insulin aspart protamine- aspart (NOVOLOG MIX 70/30) (70-30) 100 UNIT/ML injection Inject 0.18-0.35 mLs (18-35 Units total) into the skin See admin instructions. Inject 35 units in the morning and 18 units at night Patient taking differently: Inject 25-30 Units into the skin See admin instructions. Inject 30  units in the morning and  25 units at night 04/30/19  Yes Ghimire,  Werner Lean, MD  JANUVIA 100 MG tablet Take 100 mg by mouth daily. 04/23/20  Yes [provider]  lisinopril-hydrochlorothiazide (ZESTORETIC) 20-25 MG tablet Take 1 tablet by mouth 2 (two) times daily. 08/04/20  Yes [provider]  metFORMIN (GLUCOPHAGE) 500 MG tablet Take 1 tablet (500 mg total) by mouth 2 (two) times daily. 05/01/19  Yes Ghimire, Werner Lean, MD  pantoprazole (PROTONIX) 40 MG tablet Take 1 tablet (40 mg total) by mouth daily at 12 noon. Patient taking differently: Take 40 mg by mouth daily as needed (heartburn). 04/30/19  Yes Ghimire, Werner Lean, MD  simvastatin (ZOCOR) 40 MG tablet Take 40 mg by mouth at bedtime. 02/06/19  Yes [provider]  trolamine salicylate (ASPERCREME/ALOE) 10 % cream Apply 1 application topically as needed for muscle pain. 08/18/20  Yes Theron Arista, PA-C  Vitamin D, Ergocalciferol, (DRISDOL) 1.25 MG (50000 UNIT) CAPS capsule Take 50,000 Units by mouth every Friday. 08/05/20  Yes [provider]  dexamethasone (DECADRON) 6 MG tablet Take 1 tablet (6 mg total) by mouth daily. Patient not taking: No sig reported 04/30/19   Maretta Bees, MD  lidocaine (XYLOCAINE) 2 % jelly Apply 1 application topically as needed. Patient not taking: No sig reported 08/13/20   Tanda Rockers, PA-C  Nitroglycerin 0.4 % OINT After cleansing apply twice daily to rectum for 4 weeks Patient not taking: No sig reported 08/13/20   Tanda Rockers, PA-C  rivaroxaban (XARELTO) 20 MG TABS tablet Take 1 tablet (20 mg total) by mouth daily with supper. Patient not taking: Reported on 08/30/2020 05/21/19   Maretta Bees, MD    Physical Exam: Constitutional: Moderately built and nourished. Vitals:   08/30/20 1836 08/30/20 1959 08/30/20 2340 08/31/20 0448  BP: 138/66 137/66 126/72 122/73  Pulse: 71 79 72 71  Resp: 18 17 19 16   Temp:  98.8 F (37.1 C)    TempSrc:  Oral    SpO2: 100% 100% 100% 100%  Weight:      Height:       Eyes: Anicteric no  pallor. ENMT: No discharge from the ears eyes nose and mouth. Neck: No mass felt.  No neck rigidity. Respiratory: No rhonchi or crepitations. Cardiovascular: S1-S2 heard. Abdomen: Soft nontender bowel sounds present. Musculoskeletal: No edema. Skin: No rash. Neurologic: Alert awake oriented to time place and person.  Moves all extremities but mildly weak in both lower extremities with poor vibration sense.  Poor deep tendon reflexes. Psychiatric: Appears normal.  Normal affect.   Labs on Admission: I have personally reviewed following labs and imaging studies  CBC: Recent Labs  Lab 08/30/20 1644  WBC 3.7*  NEUTROABS 2.4  HGB 9.7*  HCT 28.3*  MCV 105.6*  PLT 98*   Basic Metabolic Panel: Recent Labs  Lab 08/30/20 1644  NA 140  K 3.4*  CL 104  CO2  25  GLUCOSE 135*  BUN 30*  CREATININE 1.14*  CALCIUM 9.6   GFR: Estimated Creatinine Clearance: 47.8 mL/min (A) (by C-G formula based on SCr of 1.14 mg/dL (H)). Liver Function Tests: Recent Labs  Lab 08/30/20 1644  AST 25  ALT 31  ALKPHOS 66  BILITOT 1.0  PROT 7.2  ALBUMIN 4.3   No results for input(s): LIPASE, AMYLASE in the last 168 hours. No results for input(s): AMMONIA in the last 168 hours. Coagulation Profile: No results for input(s): INR, PROTIME in the last 168 hours. Cardiac Enzymes: No results for input(s): CKTOTAL, CKMB, CKMBINDEX, TROPONINI in the last 168 hours. BNP (last 3 results) No results for input(s): PROBNP in the last 8760 hours. HbA1C: No results for input(s): HGBA1C in the last 72 hours. CBG: Recent Labs  Lab 08/31/20 0220  GLUCAP 168*   Lipid Profile: No results for input(s): CHOL, HDL, LDLCALC, TRIG, CHOLHDL, LDLDIRECT in the last 72 hours. Thyroid Function Tests: No results for input(s): TSH, T4TOTAL, FREET4, T3FREE, THYROIDAB in the last 72 hours. Anemia Panel: No results for input(s): VITAMINB12, FOLATE, FERRITIN, TIBC, IRON, RETICCTPCT in the last 72 hours. Urine analysis:     Component Value Date/Time   COLORURINE YELLOW 11/19/2014 1836   APPEARANCEUR CLEAR 11/19/2014 1836   LABSPEC 1.030 11/19/2014 1836   PHURINE 7.0 11/19/2014 1836   GLUCOSEU >1000 (A) 11/19/2014 1836   HGBUR NEGATIVE 11/19/2014 1836   BILIRUBINUR NEGATIVE 11/19/2014 1836   KETONESUR NEGATIVE 11/19/2014 1836   PROTEINUR NEGATIVE 11/19/2014 1836   UROBILINOGEN 0.2 11/19/2014 1836   NITRITE NEGATIVE 11/19/2014 1836   LEUKOCYTESUR SMALL (A) 11/19/2014 1836   Sepsis Labs: @LABRCNTIP (procalcitonin:4,lacticidven:4) ) Recent Results (from the past 240 hour(s))  Resp Panel by RT-PCR (Flu A&B, Covid) Nasopharyngeal Swab     Status: None   Collection Time: 08/30/20  6:10 PM   Specimen: Nasopharyngeal Swab; Nasopharyngeal(NP) swabs in vial transport medium  Result Value Ref Range Status   SARS Coronavirus 2 by RT PCR NEGATIVE NEGATIVE Final    Comment: (NOTE) SARS-CoV-2 target nucleic acids are NOT DETECTED.  The SARS-CoV-2 RNA is generally detectable in upper respiratory specimens during the acute phase of infection. The lowest concentration of SARS-CoV-2 viral copies this assay can detect is 138 copies/mL. A negative result does not preclude SARS-Cov-2 infection and should not be used as the sole basis for treatment or other patient management decisions. A negative result may occur with  improper specimen collection/handling, submission of specimen other than nasopharyngeal swab, presence of viral mutation(s) within the areas targeted by this assay, and inadequate number of viral copies(<138 copies/mL). A negative result must be combined with clinical observations, patient history, and epidemiological information. The expected result is Negative.  Fact Sheet for Patients:  09/01/20  Fact Sheet for Healthcare Providers:  BloggerCourse.com  This test is no t yet approved or cleared by the [employer] FDA and  has been  authorized for detection and/or diagnosis of SARS-CoV-2 by FDA under an Emergency Use Authorization (EUA). This EUA will remain  in effect (meaning this test can be used) for the duration of the COVID-19 declaration under Section 564(b)(1) of the Act, 21 U.S.C.section 360bbb-3(b)(1), unless the authorization is terminated  or revoked sooner.       Influenza A by PCR NEGATIVE NEGATIVE Final   Influenza B by PCR NEGATIVE NEGATIVE Final    Comment: (NOTE) The Xpert Xpress SARS-CoV-2/FLU/RSV plus assay is intended as an aid in the diagnosis of influenza from Nasopharyngeal  swab specimens and should not be used as a sole basis for treatment. Nasal washings and aspirates are unacceptable for Xpert Xpress SARS-CoV-2/FLU/RSV testing.  Fact Sheet for Patients: BloggerCourse.com  Fact Sheet for Healthcare Providers: [employer]ious[employer]ker[employer]it  This test is not yet approved or cleared by the [place] FDA and has been authorized for detection and/or diagnosis of SARS-CoV-2 by FDA under an Emergency Use Authorization (EUA). This EUA will remain in effect (meaning this test can be used) for the duration of the COVID-19 declaration under Section 564(b)(1) of the Act, 21 U.S.C. section 360bbb-3(b)(1), unless the authorization is terminated or revoked.  Performed at [facility], [address] [place], [place] [address]      Radiological Exams on Admission: CT HEAD WO CONTRAST ( )  Result Date: 08/30/2020 CLINICAL DATA:  Low back pain and extremity weakness, initial encounter EXAM: CT HEAD WITHOUT CONTRAST TECHNIQUE: Contiguous axial images were obtained from the base of the skull through the vertex without intravenous contrast. COMPARISON:  None. FINDINGS: Brain: No evidence of acute infarction, hemorrhage, hydrocephalus, extra-axial collection or mass lesion/mass effect. Mild atrophic changes are noted. Mild basal ganglia  calcifications are seen as well. Vascular: No hyperdense vessel or unexpected calcification. Skull: Normal. Negative for fracture or focal lesion. Sinuses/Orbits: No acute finding. Other: None. IMPRESSION: Chronic atrophic changes without acute abnormality. Electronically Signed   By: Alcide Clever M.D.   On: 08/30/2020 17:29   CT Lumbar Spine Wo Contrast  Result Date: 08/30/2020 CLINICAL DATA:  Low back pain, cauda equina syndrome suspected. Additional history provided: Extremity weakness, motor neuron disease. EXAM: CT LUMBAR SPINE WITHOUT CONTRAST TECHNIQUE: Multidetector CT imaging of the lumbar spine was performed without intravenous contrast administration. Multiplanar CT image reconstructions were also generated. COMPARISON:  None FINDINGS: Segmentation: 5 lumbar vertebrae. The caudal most well-formed intervertebral disc space is designated Alignment: No significant spondylolisthesis. Vertebrae: Vertebral body height is maintained. No evidence of fracture to the lumbar spine. Paraspinal and other soft tissues: Paraspinal soft tissues unremarkable. Aortoiliac atherosclerosis. Disc levels: No more than mild disc space narrowing at any level. T10-T11: Disc bulge. Facet arthrosis and ligamentum flavum calcification. No appreciable significant spinal canal stenosis. The neural foramina are incompletely included in the field of view. T11-T12: Disc bulge. Facet arthrosis and ligamentum flavum calcification. No appreciable significant spinal canal stenosis. Moderate left neural foraminal narrowing. T12-L1: Disc bulge. Mild facet arthrosis and ligamentum flavum calcification. No appreciable significant spinal canal stenosis or neural foraminal narrowing. L1-L2: Disc bulge. Facet arthrosis with ligamentum flavum hypertrophy. No appreciable significant spinal canal stenosis or neural foraminal narrowing. L2-L3: Disc bulge. Facet arthrosis with ligamentum flavum hypertrophy. Suspected at least moderate spinal canal  stenosis. Mild left neural foraminal narrowing. L3-L4: Disc bulge. Facet arthrosis with ligamentum flavum hypertrophy. Suspected at least moderate spinal canal stenosis. Bilateral neural foraminal narrowing (mild right, moderate/severe left). L4-L5: Disc bulge. Facet arthrosis with ligamentum flavum hypertrophy. Suspected at least moderate spinal canal stenosis. Bilateral neural foraminal narrowing (mild right, moderate left). L5-S1: Disc bulge. Facet arthrosis with ligamentum flavum hypertrophy. No appreciable significant spinal canal stenosis or neural foraminal narrowing. IMPRESSION: Lumbar spondylosis, as outlined. Multilevel spinal canal stenosis. Most notably, there is suspected at least moderate spinal canal stenosis at L2-L3, L3-L4 and L4-L5. Given the clinical concern for cauda equina syndrome, an MRI should be considered for further evaluation if the patient is able to have one. Multilevel foraminal stenosis, as detailed and greatest on the left at T11-T12 (moderate), on the left at  L3-L4 (moderate/severe) and on the left at L4-L5 (moderate). Aortic Atherosclerosis (ICD10-I70.0). Electronically Signed   By: Jackey Loge D.O.   On: 08/30/2020 17:37   MR Cervical Spine W or Wo Contrast  Result Date: 08/30/2020 CLINICAL DATA:  Demyelinating disease Motor neuron disease Myelopathy, acute or progressive; Demyelinating disease Motor neuron disease Myelopathy, acute or progressive Cord compression please include cauda; Motor neuron disease Myelopathy, acute or progressive EXAM: MRI CERVICAL, THORACIC AND LUMBAR SPINE WITHOUT AND WITH CONTRAST TECHNIQUE: Multiplanar and multiecho pulse sequences of the cervical spine, to include the craniocervical junction and cervicothoracic junction, and thoracic and lumbar spine, were obtained without and with intravenous contrast. CONTRAST:  7.7mL GADAVIST GADOBUTROL 1 MMOL/ML IV SOLN COMPARISON:  None. FINDINGS: MRI CERVICAL SPINE FINDINGS Alignment: Physiologic.  Vertebrae: No fracture, evidence of discitis, or bone lesion. 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 There is hyperintense T2-weighted signal within the dorsal columns of the spinal cord at the C5-6 levels. Posterior Fossa, vertebral arteries, paraspinal tissues: Negative Disc levels: C2-3: Unremarkable. C3-4: Small right subarticular disc protrusion with mild right foraminal stenosis. C4-5: Small disc bulge with uncovertebral hypertrophy. Moderate spinal canal stenosis with flattening of the spinal cord. C5-6: Left asymmetric disc bulge with uncovertebral hypertrophy. Moderate left foraminal stenosis and mild spinal canal stenosis. C6-7: No disc herniation or stenosis. MRI THORACIC SPINE FINDINGS Alignment:  Physiologic. Vertebrae: No fracture, evidence of discitis, or bone lesion. Cord: Hyperintense T2-weighted signal within the dorsal columns within most of the lower thoracic spine, though detailed assessment is limited by motion. Paraspinal and other soft tissues: Negative Disc levels: No spinal canal stenosis. MRI LUMBAR SPINE FINDINGS Segmentation:  Standard. Alignment:  Physiologic. Vertebrae:  No fracture, evidence of discitis, or bone lesion. Conus medullaris and cauda equina: Conus extends to the L2 level. Conus and cauda equina appear normal. Paraspinal and other soft tissues: Negative Disc levels: Small disc bulge at L4-5 with mild narrowing of the lateral recesses. The other lumbar disc levels are unremarkable. No abnormal contrast enhancement. IMPRESSION: 1. 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 Unclear etiology. 2. Hyperintense T2-weighted signal lesions of the dorsal columns of the spinal cord at the C5-6 levels and throughout the lower thoracic spinal cord, most consistent with subacute combined degeneration (as may be seen in B12 deficiency). 3.  Cervical degenerative disc disease with moderate spinal canal stenosis at C4-5 and mild spinal canal stenosis at C5-6. 4. Mild narrowing of the lateral recesses at L4-5. Electronically Signed   By: Deatra Robinson M.D.   On: 08/30/2020 22:07   MR THORACIC SPINE W WO CONTRAST  Result Date: 08/30/2020 CLINICAL DATA:  Demyelinating disease Motor neuron disease Myelopathy, acute or progressive; Demyelinating disease Motor neuron disease Myelopathy, acute or progressive Cord compression please include cauda; Motor neuron disease Myelopathy, acute or progressive EXAM: MRI CERVICAL, THORACIC AND LUMBAR SPINE WITHOUT AND WITH CONTRAST TECHNIQUE: Multiplanar and multiecho pulse sequences of the cervical spine, to include the craniocervical junction and cervicothoracic junction, and thoracic and lumbar spine, were obtained without and with intravenous contrast. CONTRAST:  7.87mL GADAVIST GADOBUTROL 1 MMOL/ML IV SOLN COMPARISON:  None. FINDINGS: MRI CERVICAL SPINE FINDINGS Alignment: Physiologic. Vertebrae: No fracture, evidence of discitis, or bone lesion. 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 There is hyperintense T2-weighted signal within the dorsal columns of the spinal cord  at the C5-6 levels. Posterior Fossa, vertebral arteries, paraspinal tissues: Negative Disc levels: C2-3: Unremarkable. C3-4: Small right subarticular disc protrusion with mild right foraminal stenosis. C4-5: Small disc bulge with uncovertebral hypertrophy. Moderate spinal canal stenosis with flattening of the spinal cord. C5-6: Left asymmetric disc bulge with uncovertebral hypertrophy. Moderate left foraminal stenosis and mild spinal canal stenosis. C6-7: No disc herniation or stenosis. MRI THORACIC SPINE FINDINGS Alignment:  Physiologic. Vertebrae: No fracture, evidence of discitis, or bone lesion. Cord: Hyperintense T2-weighted signal within the dorsal columns within  most of the lower thoracic spine, though detailed assessment is limited by motion. Paraspinal and other soft tissues: Negative Disc levels: No spinal canal stenosis. MRI LUMBAR SPINE FINDINGS Segmentation:  Standard. Alignment:  Physiologic. Vertebrae:  No fracture, evidence of discitis, or bone lesion. Conus medullaris and cauda equina: Conus extends to the L2 level. Conus and cauda equina appear normal. Paraspinal and other soft tissues: Negative Disc levels: Small disc bulge at L4-5 with mild narrowing of the lateral recesses. The other lumbar disc levels are unremarkable. No abnormal contrast enhancement. IMPRESSION: 1. 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 Unclear etiology. 2. Hyperintense T2-weighted signal lesions of the dorsal columns of the spinal cord at the C5-6 levels and throughout the lower thoracic spinal cord, most consistent with subacute combined degeneration (as may be seen in B12 deficiency). 3. Cervical degenerative disc disease with moderate spinal canal stenosis at C4-5 and mild spinal canal stenosis at C5-6. 4. Mild narrowing of the lateral recesses at L4-5. Electronically Signed   By: Deatra RobinsonKevin  Herman M.D.   On: 08/30/2020 22:07   MR Lumbar Spine W Wo Contrast  Result Date: 08/30/2020 CLINICAL DATA:  Demyelinating disease Motor neuron disease Myelopathy, acute or progressive; Demyelinating disease Motor neuron disease Myelopathy, acute or progressive Cord compression please include cauda; Motor neuron disease Myelopathy, acute or progressive EXAM: MRI CERVICAL, THORACIC AND LUMBAR SPINE WITHOUT AND WITH CONTRAST TECHNIQUE: Multiplanar and multiecho pulse sequences of the cervical spine, to include the craniocervical junction and cervicothoracic junction, and thoracic and lumbar spine, were obtained without and with intravenous contrast. CONTRAST:  7.725mL GADAVIST GADOBUTROL 1 MMOL/ML IV SOLN COMPARISON:  None. FINDINGS:  MRI CERVICAL SPINE FINDINGS Alignment: Physiologic. Vertebrae: No fracture, evidence of discitis, or bone lesion. 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 There is hyperintense T2-weighted signal within the dorsal columns of the spinal cord at the C5-6 levels. Posterior Fossa, vertebral arteries, paraspinal tissues: Negative Disc levels: C2-3: Unremarkable. C3-4: Small right subarticular disc protrusion with mild right foraminal stenosis. C4-5: Small disc bulge with uncovertebral hypertrophy. Moderate spinal canal stenosis with flattening of the spinal cord. C5-6: Left asymmetric disc bulge with uncovertebral hypertrophy. Moderate left foraminal stenosis and mild spinal canal stenosis. C6-7: No disc herniation or stenosis. MRI THORACIC SPINE FINDINGS Alignment:  Physiologic. Vertebrae: No fracture, evidence of discitis, or bone lesion. Cord: Hyperintense T2-weighted signal within the dorsal columns within most of the lower thoracic spine, though detailed assessment is limited by motion. Paraspinal and other soft tissues: Negative Disc levels: No spinal canal stenosis. MRI LUMBAR SPINE FINDINGS Segmentation:  Standard. Alignment:  Physiologic. Vertebrae:  No fracture, evidence of discitis, or bone lesion. Conus medullaris and cauda equina: Conus extends to the L2 level. Conus and cauda equina appear normal. Paraspinal and other soft tissues: Negative Disc levels: Small disc bulge at L4-5 with mild narrowing of the  lateral recesses. The other lumbar disc levels are unremarkable. No abnormal contrast enhancement. IMPRESSION: 1. 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 Unclear etiology. 2. Hyperintense T2-weighted signal lesions of the dorsal columns of the spinal cord at the C5-6 levels and throughout the lower thoracic spinal cord, most consistent with subacute combined  degeneration (as may be seen in B12 deficiency). 3. Cervical degenerative disc disease with moderate spinal canal stenosis at C4-5 and mild spinal canal stenosis at C5-6. 4. Mild narrowing of the lateral recesses at L4-5. Electronically Signed   By: Deatra Robinson M.D.   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  Discussed with neurologist.  At this time neurologist has ordered B12 levels, folate, RPR, thiamine level TSH and they will be seeing in consult. Macrocytic anemia follow-up folate and B12 levels.  Follow CBC. Hypertension we will continue lisinopril but hold hydrochlorothiazide until we correct potassium.  As needed IV hydralazine. Diabetes mellitus type 2 on NovoLog 70/30.  Sliding scale coverage. Hyperlipidemia on statins. Chronic kidney disease stage III creatinine appears to be at baseline. Previous history of pulm embolism last year of anticoagulation.  Since patient has significant weakness of the lower extremity with difficulty walking and subacute combined degeneration of the cord will need close monitoring and further management and inpatient status.   DVT prophylaxis: SCDs for now.  Holding anticoagulation until seen by neurologist and if lumbar puncture not anticipated or any procedure anticipated we will start Lovenox. Code Status: Full code. Family Communication: Discussed with patient. Disposition Plan: To be determined. Consults called: Neurology. Admission status: Inpatient.   Eduard Clos MD [facility] Pager [phone].  If 7PM-7AM, please contact night-coverage [email or web address] Password [facility]  08/31/2020, 5:47 AM

## 2020-08-31 NOTE — ED Notes (Signed)
Attempted to call report to unit and no one answered. Will attempt again after shift change.

## 2020-08-31 NOTE — ED Notes (Signed)
Report given to Christina C, RN  

## 2020-08-31 NOTE — ED Notes (Signed)
Pt cleaned up with full bed change.

## 2020-08-31 NOTE — ED Notes (Signed)
Difficulty obtaining labs due to vasculature. RN attempted unsuccessfully. Phleb asked to attempt.

## 2020-08-31 NOTE — Consult Note (Addendum)
NEUROLOGY CONSULTATION NOTE   Date of service: August 31, 2020 Patient Name: Diane Willis MRN:  [id number] DOB:  1955/09/17 Reason for consult: "BL lower ext numbness and incoordination" Requesting Provider: Eduard Clos, MD _ _ _   _ __   _ __ _ _  __ __   _ __   __ _  History of Present Illness  Diane Willis is a 65 y.o. female with PMH significant for hx of COVID 19, DM2, HTN who presents with BL lower ext numbness and difficulty walking. She first noticed trouble walking about 3 weeks ago along with tingling in the dorsal aspect of her feet BL. She saw her PCP and was thought to be due to longstanding diabetes and started on gabapentin. Symptoms continue to progress and over the last week has been using a walker and over the last couple of days, she has not been able to walk. No bowel or bladder incontinence, endorses chronic constipation, does not endorse lhermitte's sign. No recent Vaccination. No recent GI or URI or UTI like symptoms.  Endorses eating a good mix of meat, vegetables and dairy, takes a vitamin D supplement everyday. No smoking, Inhalant abuse, no alcohol use, no recreational substances. Works in a school. No heavy metal exposure. No hx of syphilis, no high risk sexual behavior. No hx of autoimmune disorders, no family hx of autoimmune disorders. No hx of truama with significant head or neck injury.   ROS   Constitutional Denies weight loss, fever and chills.   HEENT Denies changes in vision and hearing.   Respiratory Denies SOB and cough.   CV Denies palpitations and CP   GI Denies abdominal pain, nausea, vomiting and diarrhea.   GU Denies dysuria and urinary frequency.   MSK Denies myalgia and joint pain.   Skin Denies rash and pruritus.   Neurological Denies headache and syncope.   Psychiatric Denies recent changes in mood. Denies anxiety and depression.    Past History   Past Medical History:  Diagnosis Date  . COVID-19 04/2012  . Diabetes  mellitus without complication (HCC)   . Hypertension    Past Surgical History:  Procedure Laterality Date  . ABDOMINAL HYSTERECTOMY     Family History  Problem Relation Age of Onset  . Healthy Mother   . COPD Father   . Healthy Sister   . HIV Brother   . Healthy Sister   . HIV Sister   . HIV Brother   . HIV Brother   . HIV Brother   . HIV Brother   . CAD Neg Hx   . Diabetes Mellitus II Neg Hx    Social History   Socioeconomic History  . Marital status: Married    Spouse name: Not on file  . Number of children: 3  . Years of education: Not on file  . Highest education level: Not on file  Occupational History  . Not on file  Tobacco Use  . Smoking status: Never  . Smokeless tobacco: Never  Vaping Use  . Vaping Use: Never used  Substance and Sexual Activity  . Alcohol use: No  . Drug use: No  . Sexual activity: Not on file  Other Topics Concern  . Not on file  Social History Narrative  . Not on file   Social Determinants of Health   Financial Resource Strain: Not on file  Food Insecurity: Not on file  Transportation Needs: Not on file  Physical Activity: Not on  file  Stress: Not on file  Social Connections: Not on file   No Known Allergies  Medications  (Not in a hospital admission)    Vitals   Vitals:   08/30/20 1836 08/30/20 1959 08/30/20 2340 08/31/20 0448  BP: 138/66 137/66 126/72 122/73  Pulse: 71 79 72 71  Resp: 18 17 19 16   Temp:  98.8 F (37.1 C)    TempSrc:  Oral    SpO2: 100% 100% 100% 100%  Weight:      Height:         Body mass index is 31.74 kg/m.  Physical Exam   General: Laying comfortably in bed; in no acute distress.  HENT: Normal oropharynx and mucosa. Normal external appearance of ears and nose.  Neck: Supple, no pain or tenderness  CV: No JVD. No peripheral edema.  Pulmonary: Symmetric Chest rise. Normal respiratory effort.  Abdomen: Soft to touch, non-tender.  Ext: No cyanosis, edema, or deformity  Skin: No  rash. Normal palpation of skin.   Musculoskeletal: Normal digits and nails by inspection. No clubbing.   Neurologic Examination  Mental status/Cognition: Alert, oriented to self, place, month and year, good attention.  Speech/language: Fluent, comprehension intact, object naming intact, repetition intact.  Cranial nerves:   CN II Pupils equal and reactive to light, no VF deficits    CN III,IV,VI EOM intact, no gaze preference or deviation, no nystagmus    CN V normal sensation in V1, V2, and V3 segments bilaterally    CN VII no asymmetry, no nasolabial fold flattening    CN VIII normal hearing to speech    CN IX & X normal palatal elevation, no uvular deviation    CN XI 5/5 head turn and 5/5 shoulder shrug bilaterally    CN XII midline tongue protrusion    Motor:  Muscle bulk: normal, tone normal, pronator drift none. Mvmt Root Nerve  Muscle Right Left Comments  SA C5/6 Ax Deltoid 5 5   EF C5/6 Mc Biceps 5 5   EE C6/7/8 Rad Triceps 5 5   WF C6/7 Med FCR     WE C7/8 PIN ECU     F Ab C8/T1 U ADM/FDI 5 5   HF L1/2/3 Fem Illopsoas 5 5   KE L2/3/4 Fem Quad 5 5   DF L4/5 D Peron Tib Ant 5 5   PF S1/2 Tibial Grc/Sol 5 5    Reflexes:  Right Left Comments  Pectoralis      Biceps (C5/6) 2 2   Brachioradialis (C5/6) 2 2    Triceps (C6/7) 2 2    Patellar (L3/4) 2+ 2+    Achilles (S1)      Hoffman      Plantar     Jaw jerk    Sensation:  Light touch Paresthesias in dorsal aspect of BL feet.   Pin prick intact   Temperature    Vibration Absent in BL feet, BL knees, decreased in BL finger tips.  Proprioception Absent in BL ankles and BL thumbs. Refused to let me remove her socks.   Coordination/Complex Motor:  - Finger to Nose with ataxia BL - Heel to shin unable to do - Rapid alternating movement are slowed. - Gait: Deferred due to poor coordination in BL feet.  Labs   CBC:  Recent Labs  Lab 08/30/20 1644  WBC 3.7*  NEUTROABS 2.4  HGB 9.7*  HCT 28.3*  MCV 105.6*   PLT 98*    Basic Metabolic Panel:  Lab Results  Component Value Date   NA 140 08/30/2020   K 3.4 (L) 08/30/2020   CO2 25 08/30/2020   GLUCOSE 135 (H) 08/30/2020   BUN 30 (H) 08/30/2020   CREATININE 1.14 (H) 08/30/2020   CALCIUM 9.6 08/30/2020   GFRNONAA 54 (L) 08/30/2020   GFRAA 45 (L) 04/30/2019   Lipid Panel: No results found for: LDLCALC HgbA1c: No results found for: HGBA1C Urine Drug Screen: No results found for: LABOPIA, COCAINSCRNUR, LABBENZ, AMPHETMU, THCU, LABBARB  Alcohol Level No results found for: ETH  CT Head without contrast: Personally reviewed and CTH was negative for a large hypodensity concerning for a large territory infarct or hyperdensity concerning for an ICH  MR C, T, L spine with and without contrast(personally reviewed): 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 2. 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  Impression   ERIEL DUNCKEL is a 65 y.o. female with PMH significant for hx of COVID 19, DM2, HTN who presents with BL lower ext numbness and difficulty walking. 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 Her neurologic examination is notable for decreased vibration and proprioception along with ataxic movements in BL upper extremities which resolve with her eyes closed. She has intact reflexes and 5 out of 5 strength in all extremities.  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 Other differentials include pernicious anemia, syphilis, copper deficiency myeloneuropathy. Less likely zinc excess myeloneuropathy, nitrous oxide myelopathy.   Will start with testing for above and if non  revealing, may need an LP.  Recommendations  - I ordered serum B12, folate, TSH, homocysteine, methylmalonic acid, Thiamine, RPR, HIV Ab, intrinsic factor antibodies, Cu, Zn, Ceruloplasmin. - After the labs were drawn, I ordered Vit B12 IM daily x 7 days, followed by IM weekly - I ordered thiamine 100mg  daily IV or PO after the labs were drawn. - May need an LP if the labs above are non revealing. - PT and OT - Neurology will continue to follow.  Plan discussed with Dr. . ______________________________________________________________________   Thank you for the opportunity to take part in the care of this patient. If you have any further questions, please contact the neurology consultation attending.  Signed,  Toniann Fail [facility] Pager Number Erick Blinks _ _ _   _ __   _ __ _ _  __ __   _ __   __ _

## 2020-08-31 NOTE — ED Notes (Signed)
Pt with episode of urinary incontinence. Pt cleaned up with bed sheets and brief changed.

## 2020-09-01 DIAGNOSIS — G320 Subacute combined degeneration of spinal cord in diseases classified elsewhere: Secondary | ICD-10-CM | POA: Diagnosis not present

## 2020-09-01 DIAGNOSIS — E538 Deficiency of other specified B group vitamins: Secondary | ICD-10-CM | POA: Diagnosis not present

## 2020-09-01 LAB — GLUCOSE, CAPILLARY
Glucose-Capillary: 416 mg/dL — ABNORMAL HIGH (ref 70–99)
Glucose-Capillary: 389 mg/dL — ABNORMAL HIGH (ref 70–99)
Glucose-Capillary: 148 mg/dL — ABNORMAL HIGH (ref 70–99)
Glucose-Capillary: 111 mg/dL — ABNORMAL HIGH (ref 70–99)
Glucose-Capillary: 55 mg/dL — ABNORMAL LOW (ref 70–99)
Glucose-Capillary: 64 mg/dL — ABNORMAL LOW (ref 70–99)
Glucose-Capillary: 100 mg/dL — ABNORMAL HIGH (ref 70–99)

## 2020-09-01 LAB — BASIC METABOLIC PANEL
Anion gap: 10 (ref 5–15)
BUN: 27 mg/dL — ABNORMAL HIGH (ref 8–23)
CO2: 21 mmol/L — ABNORMAL LOW (ref 22–32)
Calcium: 8.7 mg/dL — ABNORMAL LOW (ref 8.9–10.3)
Chloride: 102 mmol/L (ref 98–111)
Creatinine, Ser: 1.41 mg/dL — ABNORMAL HIGH (ref 0.44–1.00)
GFR, Estimated: 42 mL/min — ABNORMAL LOW (ref >60)
Glucose, Bld: 366 mg/dL — ABNORMAL HIGH (ref 70–99)
Potassium: 4.5 mmol/L (ref 3.5–5.1)
Sodium: 133 mmol/L — ABNORMAL LOW (ref 135–145)

## 2020-09-01 LAB — CBC
HCT: 25 % — ABNORMAL LOW (ref 36.0–46.0)
Hemoglobin: 8.5 g/dL — ABNORMAL LOW (ref 12.0–15.0)
MCH: 36.2 pg — ABNORMAL HIGH (ref 26.0–34.0)
MCHC: 34 g/dL (ref 30.0–36.0)
MCV: 106.4 fL — ABNORMAL HIGH (ref 80.0–100.0)
Platelets: 99 10*3/uL — ABNORMAL LOW (ref 150–400)
RBC: 2.35 MIL/uL — ABNORMAL LOW (ref 3.87–5.11)
RDW: 14.9 % (ref 11.5–15.5)
WBC: 3.4 10*3/uL — ABNORMAL LOW (ref 4.0–10.5)
nRBC: 1.2 % — ABNORMAL HIGH (ref 0.0–0.2)

## 2020-09-01 LAB — CERULOPLASMIN: Ceruloplasmin: 28.1 mg/dL (ref 19.0–39.0)

## 2020-09-01 LAB — MAGNESIUM: Magnesium: 1.9 mg/dL (ref 1.7–2.4)

## 2020-09-01 LAB — HOMOCYSTEINE: Homocysteine: >250 umol/L — ABNORMAL HIGH (ref 0.0–17.2)

## 2020-09-01 MED ORDER — GLUCAGON HCL RDNA (DIAGNOSTIC) 1 MG IJ SOLR
INTRAMUSCULAR | Status: AC
  Administered 2020-09-01: 1 mg
  Filled 2020-09-01: qty 1

## 2020-09-01 MED ORDER — INSULIN ASPART 100 UNIT/ML IJ SOLN
15.0000 [IU] | Freq: Once | INTRAMUSCULAR | Status: AC
  Administered 2020-09-01: 15 [IU] via SUBCUTANEOUS

## 2020-09-01 MED ORDER — INSULIN ASPART PROT & ASPART (70-30 MIX) 100 UNIT/ML ~~LOC~~ SUSP: 30.0000 [IU] | Freq: Two times a day (BID) | SUBCUTANEOUS | Status: DC

## 2020-09-01 MED ORDER — INSULIN ASPART PROT & ASPART (70-30 MIX) 100 UNIT/ML ~~LOC~~ SUSP
30.0000 [IU] | Freq: Two times a day (BID) | SUBCUTANEOUS | Status: DC
  Administered 2020-09-01: 20 [IU] via SUBCUTANEOUS

## 2020-09-01 MED ORDER — INSULIN ASPART PROT & ASPART (70-30 MIX) 100 UNIT/ML ~~LOC~~ SUSP
20.0000 [IU] | Freq: Two times a day (BID) | SUBCUTANEOUS | Status: DC
  Administered 2020-09-01: 20 [IU] via SUBCUTANEOUS

## 2020-09-01 MED ORDER — LINAGLIPTIN 5 MG PO TABS
5.0000 mg | ORAL_TABLET | Freq: Every day | ORAL | Status: DC
  Administered 2020-09-01: 5 mg via ORAL
  Filled 2020-09-01 (×2): qty 1

## 2020-09-01 MED ORDER — SODIUM CHLORIDE 0.9 % IV BOLUS: 500.0000 mL | Freq: Once | INTRAVENOUS | Status: DC

## 2020-09-01 NOTE — Evaluation (Signed)
Occupational Therapy Evaluation Patient Details Name: Diane Willis MRN: [id number] DOB: Jan 06, 1956 Today's Date: 09/01/2020    History of Present Illness 64-year with history of DM2, HTN, HLD admitted for progressive bilateral lower extremity weakness, numbness and tingling for the past 3 weeks.  Patient underwent extensive work-up including CT head and MRI spine which showed some demyelination of her spine consistent with finding of vitamin B12 deficiency.  Admission 4/21 for Covid.   Clinical Impression   Patient admitted for the diagnosis above.  PTA she lives with her spouse, but her MIL is able to stay with her when the husband is at work.  She has had progressive weakness over the last 3 weeks, and has needed increasing assist with mobility and ADL performance.  Deficits to independence are listed below.  Currently, she is needing +3 to transfer, and up to total assist bed level for ADL.  CIR is recommended due to patient's motivation, ability to participate with intensive rehab, and prior level of function.  Acute OT to follow her in the acute setting to maximize her functional status.      Follow Up Recommendations  CIR    Equipment Recommendations  Wheelchair (measurements OT);Wheelchair cushion (measurements OT);Tub/shower bench    Recommendations for Other Services Rehab consult     Precautions / Restrictions Precautions Precautions: Fall Restrictions Weight Bearing Restrictions: No      Mobility Bed Mobility Overal bed mobility: Needs Assistance Bed Mobility: Supine to Sit;Sit to Supine     Supine to sit: Mod assist;+2 for physical assistance Sit to supine: Max assist;+2 for physical assistance     Patient Response: Cooperative  Transfers Overall transfer level: Needs assistance Equipment used: 2 person hand held assist Transfers: Sit to/from [employer];Lateral/Scoot Transfers Sit to Stand: Total assist;+2 safety/equipment;+2 physical  assistance Stand pivot transfers: Total assist;+2 physical assistance;+2 safety/equipment Squat pivot transfers: Total assist;+2 safety/equipment;+2 physical assistance    Lateral/Scoot Transfers: Total assist;+2 physical assistance;+2 safety/equipment      Balance Overall balance assessment: Needs assistance Sitting-balance support: Bilateral upper extremity supported;Feet supported Sitting balance-Leahy Scale: Poor   Postural control: Posterior lean Standing balance support: Bilateral upper extremity supported Standing balance-Leahy Scale: Zero                             ADL either performed or assessed with clinical judgement   ADL Overall ADL's : Needs assistance/impaired     Grooming: Wash/dry hands;Wash/dry face;Set up;Bed level           Upper Body Dressing : Moderate assistance;Bed level   Lower Body Dressing: Maximal assistance;Bed level               Functional mobility during ADLs: Total assistance General ADL Comments: +3 for physical assist and safety.  Patient having difficulty coordinating trunk, UE and LE.     Vision Patient Visual Report: No change from baseline       Perception  WFL   Praxis  Intact    Pertinent Vitals/Pain Pain Assessment: No/denies pain     Hand Dominance Right   Extremity/Trunk Assessment Upper Extremity Assessment Upper Extremity Assessment: Overall WFL for tasks assessed;RUE deficits/detail;LUE deficits/detail RUE Deficits / Details: Ataxia RUE Coordination: decreased fine motor;decreased gross motor LUE Deficits / Details: Ataxia LUE Coordination: decreased fine motor;decreased gross motor   Lower Extremity Assessment Lower Extremity Assessment: Defer to PT evaluation   Cervical / Trunk Assessment Cervical / Trunk Assessment: Normal  Communication Communication Communication: No difficulties   Cognition Arousal/Alertness: Awake/alert Behavior During Therapy: WFL for tasks  assessed/performed Overall Cognitive Status: Impaired/Different from baseline Area of Impairment: Following commands;Problem solving                       Following Commands: Follows one step commands inconsistently;Follows one step commands with increased time     Problem Solving: Slow processing;Difficulty sequencing;Requires verbal cues;Requires tactile cues     General Comments       Exercises     Shoulder Instructions      Home Living Family/patient expects to be discharged to:: Private residence Living Arrangements: Spouse/significant other Available Help at Discharge: Family;Available 24 hours/day Type of Home: House Home Access: Stairs to enter [employer] of Steps: 4 Entrance Stairs-Rails: None Home Layout: One level     Bathroom Shower/Tub: Producer, television/film/video: Standard Bathroom Accessibility: Yes How Accessible: Accessible via walker Home Equipment: Walker - 4 wheels          Prior Functioning/Environment Level of Independence: Independent with assistive device(s)        Comments: Was using 4WRW at times for mobility, increasing use over the past 3 weeks.  Needing increasing assist for ADL/IADL as weakness has progressed.        OT Problem List: Decreased strength;Decreased activity tolerance;Impaired balance (sitting and/or standing);Decreased coordination      OT Treatment/Interventions: Self-care/ADL training;Therapeutic exercise;DME and/or AE instruction;Balance training;Patient/family education;Therapeutic activities    OT Goals(Current goals can be found in the care plan section) Acute Rehab OT Goals Patient Stated Goal: Get better and return home OT Goal Formulation: With patient Potential to Achieve Goals: Good ADL Goals Pt Will Perform Eating: with set-up;sitting Pt Will Perform Grooming: with set-up;sitting Pt Will Perform Upper Body Bathing: with set-up;sitting Pt Will Perform Upper Body Dressing:  with set-up;sitting  OT Frequency: Min 2X/week   Barriers to D/C:    none noted       Co-evaluation PT/OT/SLP Co-Evaluation/Treatment: Yes Reason for Co-Treatment: Complexity of the patient's impairments (multi-system involvement);For patient/therapist safety;To address functional/ADL transfers   OT goals addressed during session: ADL's and self-care      AM-PAC OT "6 Clicks" Daily Activity     Outcome Measure Help from another person eating meals?: A Little Help from another person taking care of personal grooming?: A Little Help from another person toileting, which includes using toliet, bedpan, or urinal?: A Lot Help from another person bathing (including washing, rinsing, drying)?: A Lot Help from another person to put on and taking off regular upper body clothing?: A Lot Help from another person to put on and taking off regular lower body clothing?: A Lot 6 Click Score: 14   End of Session Equipment Utilized During Treatment: Gait belt Nurse Communication: Mobility status  Activity Tolerance: Patient tolerated treatment well Patient left: in bed;with call bell/phone within reach;with bed alarm set  OT Visit Diagnosis: Unsteadiness on feet (R26.81);Other abnormalities of gait and mobility (R26.89);Ataxia, unspecified (R27.0)                Time: 7628-3151 OT Time Calculation (min): 24 min Charges:  OT General Charges $OT Visit: 1 Visit OT Evaluation $OT Eval Moderate Complexity: 1 Mod  09/01/2020  RP, OTR/L  Acute Rehabilitation Services  Office:  [phone]   Suzanna Obey 09/01/2020, 2:16 PM

## 2020-09-01 NOTE — Progress Notes (Signed)
PROGRESS NOTE    Diane Willis  WIO:[id number] DOB: Jul 27, 1955 DOA: 08/30/2020 PCP: Fleet Contras, MD   Brief Narrative:  64-year with history of DM2, HTN, HLD admitted for progressive bilateral lower extremity weakness, numbness and tingling for the past 3 weeks.  Patient underwent extensive work-up including CT head and MRI spine which showed some demyelination of her spine consistent with finding of vitamin B12 deficiency.  Started on B12 supplements.  Neurology team was following.   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   -Intrinsic factor antibody-pending -THC-normal, RPR-nonreactive, A1c, 7.8, HIV-negative   Macrocytic anemia/Thrombocytopenia-secondary to B12 deficiency  Essential hypertension-on lisinopril.  HCTZ on hold  Insulin-dependent diabetes mellitus type 2 uncontrolled secondary to hyperglycemia -sliding scale and Accu-Chek.  70/30 to 20U BID. Add Trajenta (sub for Jardiance in hosp).    Hyperlipidemia-statin   AKI on CKD stage II-Baseline Cr 1.1. Admit Cr 1.5 >1.14>1.41. Will order 500cc bolus   PT/OT pending.    DVT prophylaxis: SCDs Start: 08/31/20 0547 Code Status: Full  Family Communication:    Status is: Inpatient  Remains inpatient appropriate because:Inpatient level of care appropriate due to severity of illness  Dispo: The patient is from: Home              Anticipated d/c is to: Home              Patient currently is not medically stable to d/c.  Still feeling weak in lower extremities, pending PT/OT evaluation.   Difficult to place patient No     Subjective: 70/30 insulin not given yesterday in the ED for unknown reason  Patient seen  and examined at bedside this morning, she still feels weak and lower extremity with tingling but better compared to yesterday.  Review of Systems Otherwise negative except as per HPI, including: General = no fevers, chills, dizziness,  fatigue HEENT/EYES = negative for loss of vision, double vision, blurred vision,  sore throa Cardiovascular= negative for chest pain, palpitation Respiratory/lungs= negative for shortness of breath, cough, wheezing; hemoptysis,  Gastrointestinal= negative for nausea, vomiting, abdominal pain Genitourinary= negative for Dysuria MSK = Negative for arthralgia, myalgias Neurology= Negative for headache, numbness, tingling  Psychiatry= Negative for suicidal and homocidal ideation Skin= Negative for Rash   Examination: Constitutional: Not in acute distress Respiratory: Clear to auscultation bilaterally Cardiovascular: Normal sinus rhythm, no rubs Abdomen: Nontender nondistended good bowel sounds Musculoskeletal: No edema noted Skin: No rashes seen Neurologic: CN 2-12 grossly intact.  And nonfocal Psychiatric: Normal judgment and insight. Alert and oriented x 3. Normal mood.      Objective: Vitals:   08/31/20 2112 08/31/20 2352 09/01/20 0455 09/01/20 0730  BP: (!) 96/44 116/60 (!) 106/48 (!) 115/44  Pulse: 66 71 73 90  Resp: 16 16 16 14   Temp: 98.6 F (37 C) 98 F (36.7 C) 98.4 F (36.9 C) 98.5 F (36.9 C)  TempSrc: Oral Oral Oral Oral  SpO2: 100% 100% 100% 100%  Weight: 79.7 kg     Height: 5\' 1"  (1.549 m)      No intake or output data in the 24 hours ending 09/01/20 0802 Filed Weights   08/30/20 1555 08/31/20 2112  Weight: 76.2 kg 79.7 kg  Data Reviewed:   CBC: Recent Labs  Lab 08/30/20 1644 09/01/20 0208  WBC 3.7* 3.4*  NEUTROABS 2.4  --   HGB 9.7* 8.5*  HCT 28.3* 25.0*  MCV 105.6* 106.4*  PLT 98* 99*   Basic Metabolic Panel: Recent Labs  Lab 08/30/20 1644 08/31/20 0607 09/01/20 0208  NA 140  --  133*  K 3.4*  --   4.5  CL 104  --  102  CO2 25  --  21*  GLUCOSE 135*  --  366*  BUN 30*  --  27*  CREATININE 1.14*  --  1.41*  CALCIUM 9.6  --  8.7*  MG  --  2.0 1.9   GFR: Estimated Creatinine Clearance: 39.6 mL/min (A) (by C-G formula based on SCr of 1.41 mg/dL (H)). Liver Function Tests: Recent Labs  Lab 08/30/20 1644  AST 25  ALT 31  ALKPHOS 66  BILITOT 1.0  PROT 7.2  ALBUMIN 4.3   No results for input(s): LIPASE, AMYLASE in the last 168 hours. No results for input(s): AMMONIA in the last 168 hours. Coagulation Profile: No results for input(s): INR, PROTIME in the last 168 hours. Cardiac Enzymes: No results for input(s): CKTOTAL, CKMB, CKMBINDEX, TROPONINI in the last 168 hours. BNP (last 3 results) No results for input(s): PROBNP in the last 8760 hours. HbA1C: Recent Labs    08/31/20 0607  HGBA1C 7.8*   CBG: Recent Labs  Lab 08/31/20 0813 08/31/20 1156 08/31/20 1853 08/31/20 2116 09/01/20 0609  GLUCAP 224* 211* 287* 227* 416*   Lipid Profile: No results for input(s): CHOL, HDL, LDLCALC, TRIG, CHOLHDL, LDLDIRECT in the last 72 hours. Thyroid Function Tests: Recent Labs    08/31/20 0607  TSH 1.693   Anemia Panel: Recent Labs    08/31/20 0607  VITAMINB12 <50*  FOLATE 50.5   Sepsis Labs: No results for input(s): PROCALCITON, LATICACIDVEN in the last 168 hours.  Recent Results (from the past 240 hour(s))  Resp Panel by RT-PCR (Flu A&B, Covid) Nasopharyngeal Swab     Status: None   Collection Time: 08/30/20  6:10 PM   Specimen: Nasopharyngeal Swab; Nasopharyngeal(NP) swabs in vial transport medium  Result Value Ref Range Status   SARS Coronavirus 2 by RT PCR NEGATIVE NEGATIVE Final    Comment: (NOTE) SARS-CoV-2 target nucleic acids are NOT DETECTED.  The SARS-CoV-2 RNA is generally detectable in upper respiratory specimens during the acute phase of infection. The lowest concentration of SARS-CoV-2 viral copies this assay can detect is 138 copies/mL. A  negative result does not preclude SARS-Cov-2 infection and should not be used as the sole basis for treatment or other patient management decisions. A negative result may occur with  improper specimen collection/handling, submission of specimen other than nasopharyngeal swab, presence of viral mutation(s) within the areas targeted by this assay, and inadequate number of viral copies(<138 copies/mL). A negative result must be combined with clinical observations, patient history, and epidemiological information. The expected result is Negative.  Fact Sheet for Patients:  BloggerCourse.com  Fact Sheet for Healthcare Providers:  SeriousBroker.it  This test is no t yet approved or cleared by the [place] FDA and  has been authorized for detection and/or diagnosis of SARS-CoV-2 by FDA under an Emergency Use Authorization (EUA). This EUA will remain  in effect (meaning this test can be used) for the duration of the COVID-19 declaration under Section 564(b)(1) of the Act, 21 U.S.C.section 360bbb-3(b)(1), unless the authorization is terminated  or revoked sooner.  Influenza A by PCR NEGATIVE NEGATIVE Final   Influenza B by PCR NEGATIVE NEGATIVE Final    Comment: (NOTE) The Xpert Xpress SARS-CoV-2/FLU/RSV plus assay is intended as an aid in the diagnosis of influenza from Nasopharyngeal swab specimens and should not be used as a sole basis for treatment. Nasal washings and aspirates are unacceptable for Xpert Xpress SARS-CoV-2/FLU/RSV testing.  Fact Sheet for Patients: BloggerCourse.com  Fact Sheet for Healthcare Providers: SeriousBroker.it  This test is not yet approved or cleared by the [place] FDA and has been authorized for detection and/or diagnosis of SARS-CoV-2 by FDA under an Emergency Use Authorization (EUA). This EUA will remain in effect (meaning this test can  be used) for the duration of the COVID-19 declaration under Section 564(b)(1) of the Act, 21 U.S.C. section 360bbb-3(b)(1), unless the authorization is terminated or revoked.  Performed at [facility], [address] [place], [place] [address]          Radiology Studies: CT HEAD WO CONTRAST ( )  Result Date: 08/30/2020 CLINICAL DATA:  Low back pain and extremity weakness, initial encounter EXAM: CT HEAD WITHOUT CONTRAST TECHNIQUE: Contiguous axial images were obtained from the base of the skull through the vertex without intravenous contrast. COMPARISON:  None. FINDINGS: Brain: No evidence of acute infarction, hemorrhage, hydrocephalus, extra-axial collection or mass lesion/mass effect. Mild atrophic changes are noted. Mild basal ganglia calcifications are seen as well. Vascular: No hyperdense vessel or unexpected calcification. Skull: Normal. Negative for fracture or focal lesion. Sinuses/Orbits: No acute finding. Other: None. IMPRESSION: Chronic atrophic changes without acute abnormality. Electronically Signed   By: Alcide Clever M.D.   On: 08/30/2020 17:29   CT Lumbar Spine Wo Contrast  Result Date: 08/30/2020 CLINICAL DATA:  Low back pain, cauda equina syndrome suspected. Additional history provided: Extremity weakness, motor neuron disease. EXAM: CT LUMBAR SPINE WITHOUT CONTRAST TECHNIQUE: Multidetector CT imaging of the lumbar spine was performed without intravenous contrast administration. Multiplanar CT image reconstructions were also generated. COMPARISON:  None FINDINGS: Segmentation: 5 lumbar vertebrae. The caudal most well-formed intervertebral disc space is designated Alignment: No significant spondylolisthesis. Vertebrae: Vertebral body height is maintained. No evidence of fracture to the lumbar spine. Paraspinal and other soft tissues: Paraspinal soft tissues unremarkable. Aortoiliac atherosclerosis. Disc levels: No more than mild disc space narrowing at any level.  T10-T11: Disc bulge. Facet arthrosis and ligamentum flavum calcification. No appreciable significant spinal canal stenosis. The neural foramina are incompletely included in the field of view. T11-T12: Disc bulge. Facet arthrosis and ligamentum flavum calcification. No appreciable significant spinal canal stenosis. Moderate left neural foraminal narrowing. T12-L1: Disc bulge. Mild facet arthrosis and ligamentum flavum calcification. No appreciable significant spinal canal stenosis or neural foraminal narrowing. L1-L2: Disc bulge. Facet arthrosis with ligamentum flavum hypertrophy. No appreciable significant spinal canal stenosis or neural foraminal narrowing. L2-L3: Disc bulge. Facet arthrosis with ligamentum flavum hypertrophy. Suspected at least moderate spinal canal stenosis. Mild left neural foraminal narrowing. L3-L4: Disc bulge. Facet arthrosis with ligamentum flavum hypertrophy. Suspected at least moderate spinal canal stenosis. Bilateral neural foraminal narrowing (mild right, moderate/severe left). L4-L5: Disc bulge. Facet arthrosis with ligamentum flavum hypertrophy. Suspected at least moderate spinal canal stenosis. Bilateral neural foraminal narrowing (mild right, moderate left). L5-S1: Disc bulge. Facet arthrosis with ligamentum flavum hypertrophy. No appreciable significant spinal canal stenosis or neural foraminal narrowing. IMPRESSION: Lumbar spondylosis, as outlined. Multilevel spinal canal stenosis. Most notably, there is suspected at least moderate spinal canal stenosis at L2-L3, L3-L4 and L4-L5.  Given the clinical concern for cauda equina syndrome, an MRI should be considered for further evaluation if the patient is able to have one. Multilevel foraminal stenosis, as detailed and greatest on the left at T11-T12 (moderate), on the left at L3-L4 (moderate/severe) and on the left at L4-L5 (moderate). Aortic Atherosclerosis (ICD10-I70.0). Electronically Signed   By: Jackey LogeKyle  Golden D.O.   On: 08/30/2020  17:37   MR Cervical Spine W or Wo Contrast  Result Date: 08/30/2020 CLINICAL DATA:  Demyelinating disease Motor neuron disease Myelopathy, acute or progressive; Demyelinating disease Motor neuron disease Myelopathy, acute or progressive Cord compression please include cauda; Motor neuron disease Myelopathy, acute or progressive EXAM: MRI CERVICAL, THORACIC AND LUMBAR SPINE WITHOUT AND WITH CONTRAST TECHNIQUE: Multiplanar and multiecho pulse sequences of the cervical spine, to include the craniocervical junction and cervicothoracic junction, and thoracic and lumbar spine, were obtained without and with intravenous contrast. CONTRAST:  7.485mL GADAVIST GADOBUTROL 1 MMOL/ML IV SOLN COMPARISON:  None. FINDINGS: MRI CERVICAL SPINE FINDINGS Alignment: Physiologic. Vertebrae: No fracture, evidence of discitis, or bone lesion. 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 There is hyperintense T2-weighted signal within the dorsal columns of the spinal cord at the C5-6 levels. Posterior Fossa, vertebral arteries, paraspinal tissues: Negative Disc levels: C2-3: Unremarkable. C3-4: Small right subarticular disc protrusion with mild right foraminal stenosis. C4-5: Small disc bulge with uncovertebral hypertrophy. Moderate spinal canal stenosis with flattening of the spinal cord. C5-6: Left asymmetric disc bulge with uncovertebral hypertrophy. Moderate left foraminal stenosis and mild spinal canal stenosis. C6-7: No disc herniation or stenosis. MRI THORACIC SPINE FINDINGS Alignment:  Physiologic. Vertebrae: No fracture, evidence of discitis, or bone lesion. Cord: Hyperintense T2-weighted signal within the dorsal columns within most of the lower thoracic spine, though detailed assessment is limited by motion. Paraspinal and other soft tissues: Negative Disc levels: No spinal canal stenosis. MRI LUMBAR SPINE FINDINGS Segmentation:  Standard. Alignment:   Physiologic. Vertebrae:  No fracture, evidence of discitis, or bone lesion. Conus medullaris and cauda equina: Conus extends to the L2 level. Conus and cauda equina appear normal. Paraspinal and other soft tissues: Negative Disc levels: Small disc bulge at L4-5 with mild narrowing of the lateral recesses. The other lumbar disc levels are unremarkable. No abnormal contrast enhancement. IMPRESSION: 1. 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 Unclear etiology. 2. Hyperintense T2-weighted signal lesions of the dorsal columns of the spinal cord at the C5-6 levels and throughout the lower thoracic spinal cord, most consistent with subacute combined degeneration (as may be seen in B12 deficiency). 3. Cervical degenerative disc disease with moderate spinal canal stenosis at C4-5 and mild spinal canal stenosis at C5-6. 4. Mild narrowing of the lateral recesses at L4-5. Electronically Signed   By: Deatra RobinsonKevin  Herman M.D.   On: 08/30/2020 22:07   MR THORACIC SPINE W WO CONTRAST  Result Date: 08/30/2020 CLINICAL DATA:  Demyelinating disease Motor neuron disease Myelopathy, acute or progressive; Demyelinating disease Motor neuron disease Myelopathy, acute or progressive Cord compression please include cauda; Motor neuron disease Myelopathy, acute or progressive EXAM: MRI CERVICAL, THORACIC AND LUMBAR SPINE WITHOUT AND WITH CONTRAST TECHNIQUE: Multiplanar and multiecho pulse sequences of the cervical spine, to include the craniocervical junction and cervicothoracic junction, and thoracic and lumbar spine, were obtained without and with intravenous contrast. CONTRAST:  7.575mL GADAVIST GADOBUTROL 1 MMOL/ML IV SOLN COMPARISON:  None. FINDINGS: MRI CERVICAL SPINE FINDINGS  Alignment: Physiologic. Vertebrae: No fracture, evidence of discitis, or bone lesion. 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 There is hyperintense T2-weighted signal within the dorsal columns of the spinal cord at the C5-6 levels. Posterior Fossa, vertebral arteries, paraspinal tissues: Negative Disc levels: C2-3: Unremarkable. C3-4: Small right subarticular disc protrusion with mild right foraminal stenosis. C4-5: Small disc bulge with uncovertebral hypertrophy. Moderate spinal canal stenosis with flattening of the spinal cord. C5-6: Left asymmetric disc bulge with uncovertebral hypertrophy. Moderate left foraminal stenosis and mild spinal canal stenosis. C6-7: No disc herniation or stenosis. MRI THORACIC SPINE FINDINGS Alignment:  Physiologic. Vertebrae: No fracture, evidence of discitis, or bone lesion. Cord: Hyperintense T2-weighted signal within the dorsal columns within most of the lower thoracic spine, though detailed assessment is limited by motion. Paraspinal and other soft tissues: Negative Disc levels: No spinal canal stenosis. MRI LUMBAR SPINE FINDINGS Segmentation:  Standard. Alignment:  Physiologic. Vertebrae:  No fracture, evidence of discitis, or bone lesion. Conus medullaris and cauda equina: Conus extends to the L2 level. Conus and cauda equina appear normal. Paraspinal and other soft tissues: Negative Disc levels: Small disc bulge at L4-5 with mild narrowing of the lateral recesses. The other lumbar disc levels are unremarkable. No abnormal contrast enhancement. IMPRESSION: 1. 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 Unclear etiology. 2. Hyperintense T2-weighted signal lesions of the dorsal columns of the spinal cord at the C5-6 levels and throughout the lower thoracic spinal cord, most consistent with subacute combined degeneration (as may be seen in B12 deficiency). 3. Cervical degenerative disc disease with moderate spinal canal stenosis at C4-5 and mild spinal canal stenosis at C5-6. 4. Mild narrowing of the lateral recesses at L4-5.  Electronically Signed   By: Deatra Robinson M.D.   On: 08/30/2020 22:07   MR Lumbar Spine W Wo Contrast  Result Date: 08/30/2020 CLINICAL DATA:  Demyelinating disease Motor neuron disease Myelopathy, acute or progressive; Demyelinating disease Motor neuron disease Myelopathy, acute or progressive Cord compression please include cauda; Motor neuron disease Myelopathy, acute or progressive EXAM: MRI CERVICAL, THORACIC AND LUMBAR SPINE WITHOUT AND WITH CONTRAST TECHNIQUE: Multiplanar and multiecho pulse sequences of the cervical spine, to include the craniocervical junction and cervicothoracic junction, and thoracic and lumbar spine, were obtained without and with intravenous contrast. CONTRAST:  7.31mL GADAVIST GADOBUTROL 1 MMOL/ML IV SOLN COMPARISON:  None. FINDINGS: MRI CERVICAL SPINE FINDINGS Alignment: Physiologic. Vertebrae: No fracture, evidence of discitis, or bone lesion. 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 There is hyperintense T2-weighted signal within the dorsal columns of the spinal cord at the C5-6 levels. Posterior Fossa, vertebral arteries, paraspinal tissues: Negative Disc levels: C2-3: Unremarkable. C3-4: Small right subarticular disc protrusion with mild right foraminal stenosis. C4-5: Small disc bulge with uncovertebral hypertrophy. Moderate spinal canal stenosis with flattening of the spinal cord. C5-6: Left asymmetric disc bulge with uncovertebral hypertrophy. Moderate left foraminal stenosis and mild spinal canal stenosis. C6-7: No disc herniation or stenosis. MRI THORACIC SPINE FINDINGS Alignment:  Physiologic. Vertebrae: No fracture, evidence of discitis, or bone lesion. Cord: Hyperintense T2-weighted signal within the dorsal columns within most of the lower thoracic spine, though detailed assessment is limited by motion. Paraspinal and other soft tissues: Negative Disc levels: No spinal canal stenosis. MRI  LUMBAR SPINE FINDINGS Segmentation:  Standard. Alignment:  Physiologic. Vertebrae:  No  fracture, evidence of discitis, or bone lesion. Conus medullaris and cauda equina: Conus extends to the L2 level. Conus and cauda equina appear normal. Paraspinal and other soft tissues: Negative Disc levels: Small disc bulge at L4-5 with mild narrowing of the lateral recesses. The other lumbar disc levels are unremarkable. No abnormal contrast enhancement. IMPRESSION: 1. 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 Unclear etiology. 2. Hyperintense T2-weighted signal lesions of the dorsal columns of the spinal cord at the C5-6 levels and throughout the lower thoracic spinal cord, most consistent with subacute combined degeneration (as may be seen in B12 deficiency). 3. Cervical degenerative disc disease with moderate spinal canal stenosis at C4-5 and mild spinal canal stenosis at C5-6. 4. Mild narrowing of the lateral recesses at L4-5. Electronically Signed   By: Deatra Robinson M.D.   On: 08/30/2020 22:07        Scheduled Meds:  amLODipine  10 mg Oral q morning   aspirin EC  81 mg Oral Daily   cyanocobalamin  1,000 mcg Intramuscular Daily   Followed by   Melene Muller ON 09/07/2020] cyanocobalamin  1,000 mcg Intramuscular Weekly   insulin aspart  0-9 Units Subcutaneous TID WC   insulin aspart protamine- aspart  30 Units Subcutaneous BID WC   linagliptin  5 mg Oral Daily   lisinopril  20 mg Oral BID   simvastatin  40 mg Oral QHS   thiamine injection  100 mg Intravenous Daily   Or   thiamine  100 mg Oral Daily   Continuous Infusions:   LOS: 1 day   Time spent= 35 mins    Shauna Bodkins Joline Maxcy, MD [facility]  If 7PM-7AM, please contact night-coverage  09/01/2020, 8:02 AM

## 2020-09-01 NOTE — Progress Notes (Signed)
[facility] night shift telemetry coverage note.  The staff reported that the patient's CBG this morning was 416 mg/dL.  NovoLog 15 units SQ x1 dose ordered.  CBG will be followed again before breakfast.  Sanda Klein, MD.

## 2020-09-01 NOTE — Progress Notes (Signed)
Pt CBG @ 2124 was 55. Pt was given 4 oz of apple juice with one packet of sugar added. Pt did not drink all of the juice, so upon re-check her CBG had only risen to 64. One mg glucagon was subsequently administered IV. Will re-check CBG and will continue to monitor.

## 2020-09-01 NOTE — Evaluation (Signed)
Physical Therapy Evaluation Patient Details Name: Diane Willis MRN: [id number] DOB: 1955/07/26 Today's Date: 09/01/2020   History of Present Illness  65 y/o female presented to ED on 8/18 with bilateral LE numbness and difficulty walking x 3 weeks. 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 Suspect due to vitamin B 12 deficiency. PMH: COVID, DM type 2, HTN  Clinical Impression  PTA, patient lives with husband and MIL able to stay with her while husband is at work. Prior to 3 weeks ago, patient was independent however patient has progressively gotten weaker over that time period. Patient presents with ataxia/impaired coordination, functional weakness, impaired balance, decreased activity tolerance, and impaired functional mobility. Patient requires totalA+2 for transfers and will require +3 for safety and blocking of B LE to avoid severe ataxia when moving. Patient will benefit from skilled PT services during acute stay to address listed deficits. Recommend CIR following discharge to maximize functional mobility and safety due to current functional decline.     Follow Up Recommendations CIR    Equipment Recommendations  Other (comment) (TBD)    Recommendations for Other Services Rehab consult     Precautions / Restrictions Precautions Precautions: Fall Precaution Comments: very ataxic Restrictions Weight Bearing Restrictions: No      Mobility  Bed Mobility Overal bed mobility: Needs Assistance Bed Mobility: Supine to Sit;Sit to Supine     Supine to sit: Mod assist;+2 for physical assistance Sit to supine: Max assist;+2 for physical assistance        Transfers Overall transfer level: Needs assistance Equipment used: 2 person hand held assist Transfers: Sit to/from ;Lateral/Scoot Transfers Sit to Stand: Total assist;+2 safety/equipment;+2 physical assistance Stand pivot transfers: Total assist;+2 physical assistance;+2 safety/equipment Squat pivot transfers: Total assist;+2 safety/equipment;+2 physical assistance    Lateral/Scoot Transfers: Total assist;+2 physical assistance;+2 safety/equipment General transfer comment: totalA+2 to stand due to patient's bilateral LE ataxia so severe that patient unable to keep LEs on ground during standing or transfer. Patient unable to coordinate use of UE to assist with transfer. Will need +3 to brace/block LEs  Ambulation/Gait                Stairs            Wheelchair Mobility    Modified Rankin (Stroke Patients Only)       Balance Overall balance assessment: Needs assistance Sitting-balance support: Bilateral upper extremity supported;Feet supported Sitting balance-Leahy Scale: Poor Sitting balance - Comments: requires up to maxA to maintain balance due to truncal ataxia. Postural control: Posterior lean Standing balance support: Bilateral upper extremity supported Standing balance-Leahy Scale: Zero Standing balance comment: totalA+2                             Pertinent Vitals/Pain Pain Assessment: No/denies pain    Home Living Family/patient expects to be discharged to:: Private residence Living Arrangements: Spouse/significant other Available Help at Discharge: Family;Available 24 hours/day Type of Home: House Home Access: Stairs to enter Entrance Stairs-Rails: None Entrance Stairs-Number of Steps: 4 Home Layout: One level Home Equipment: Lonnette Shrode - 4 wheels      Prior Function Level of Independence: Independent with assistive device(s)         Comments: Was using 4WRW at times for mobility, increasing use over the past 3 weeks.  Needing increasing assist for ADL/IADL as weakness has progressed.     Hand Dominance   Dominant Hand: Right     Extremity/Trunk Assessment   Upper Extremity Assessment Upper Extremity Assessment: Defer to OT evaluation RUE Deficits / Details: Ataxia RUE Coordination: decreased fine motor;decreased gross motor LUE Deficits / Details: Ataxia LUE Coordination: decreased fine motor;decreased gross motor    Lower Extremity Assessment Lower Extremity Assessment: RLE deficits/detail;LLE deficits/detail RLE Deficits / Details: 5/5 strength; very ataxic RLE Sensation: decreased light touch RLE Coordination: decreased fine motor;decreased gross motor LLE Deficits / Details: 5/5 strength; very ataxic LLE Sensation: decreased light touch LLE Coordination: decreased fine motor;decreased gross motor    Cervical / Trunk Assessment Cervical / Trunk Assessment: Other exceptions Cervical / Trunk Exceptions: truncal ataxia  Communication   Communication: No difficulties  Cognition Arousal/Alertness: Awake/alert Behavior During Therapy: WFL for tasks assessed/performed Overall Cognitive Status: Impaired/Different from baseline Area of Impairment: Following commands;Problem solving;Awareness;Safety/judgement                       Following Commands: Follows one step commands inconsistently;Follows one step commands with increased time Safety/Judgement: Decreased awareness of deficits Awareness: Emergent Problem Solving: Slow processing;Difficulty sequencing;Requires verbal cues;Requires tactile cues General Comments: slow processing and following commands inconsistently. requires increased time to perform tasks. Decreased awareness into deficits      General Comments      Exercises     Assessment/Plan    PT Assessment Patient needs continued PT services  PT Problem List Decreased strength;Decreased activity tolerance;Decreased balance;Decreased coordination;Decreased mobility;Decreased knowledge of use of DME;Decreased cognition;Decreased safety awareness;Impaired sensation       PT  Treatment Interventions DME instruction;Gait training;Functional mobility training;Therapeutic activities;Stair training;Balance training;Therapeutic exercise;Neuromuscular re-education;Patient/family education    PT Goals (Current goals can be found in the Care Plan section)  Acute Rehab PT Goals Patient Stated Goal: Get better and return home PT Goal Formulation: With patient Time For Goal Achievement: 09/15/20 Potential to Achieve Goals: Fair    Frequency Min 3X/week   Barriers to discharge        Co-evaluation PT/OT/SLP Co-Evaluation/Treatment: Yes Reason for Co-Treatment: Complexity of the patient's impairments (multi-system involvement);For patient/therapist safety;To address functional/ADL transfers PT goals addressed during session: Mobility/safety with mobility;Balance OT goals addressed during session: ADL's and self-care       AM-PAC PT "6 Clicks" Mobility  Outcome Measure Help needed turning from your back to your side while in a flat bed without using bedrails?: Total Help needed moving from lying on your back to sitting on the side of a flat bed without using bedrails?: Total Help needed moving to and from a bed to a chair (including a wheelchair)?: Total Help needed standing up from a chair using your arms (e.g., wheelchair or bedside chair)?: Total Help needed to walk in hospital room?: Total Help needed climbing 3-5 steps with a railing? : Total 6 Click Score: 6    End of Session Equipment Utilized During Treatment: Gait belt Activity Tolerance: Patient tolerated treatment well Patient left: in bed;with call bell/phone within reach;with bed alarm set Nurse Communication: Mobility status PT Visit Diagnosis: Unsteadiness on feet (R26.81);Muscle weakness (generalized) (M62.81);Ataxic gait (R26.0);Other abnormalities of gait and mobility (R26.89);Other symptoms and signs involving the nervous system (R29.898)    Time: 7253-6644 PT Time Calculation (min) (ACUTE  ONLY): 27 min   Charges:   PT Evaluation $PT Eval Moderate Complexity: 1 Mod PT Treatments $Therapeutic Activity: 8-22 mins  Joyanne Eddinger A. Dan Humphreys PT, DPT Acute Rehabilitation Services Pager [phone] Office [phone]   Viviann Spare 09/01/2020, 4:15 PM

## 2020-09-01 NOTE — Progress Notes (Signed)
Upon re-check, pt CBG has now risen to 100. Will continue to monitor for hypoglycemia.

## 2020-09-01 NOTE — Progress Notes (Signed)
Inpatient Rehab Admissions Coordinator Note:   Per therapy recommendations, pt was screened for CIR candidacy by Megan Salon, MS CCC-SLP. At this time, Pt. Appears to have functional decline but medical necessity for CIR admission is unclear. I will request rehab MD review case for medical necessity and place a consult order if appropriate.  Megan Salon, MS, CCC-SLP Rehab Admissions Coordinator  [phone] (celll) [phone] (office)

## 2020-09-02 ENCOUNTER — Inpatient Hospital Stay (HOSPITAL_COMMUNITY): Payer: BC Managed Care – PPO

## 2020-09-02 DIAGNOSIS — G320 Subacute combined degeneration of spinal cord in diseases classified elsewhere: Secondary | ICD-10-CM | POA: Diagnosis not present

## 2020-09-02 DIAGNOSIS — E538 Deficiency of other specified B group vitamins: Secondary | ICD-10-CM | POA: Diagnosis not present

## 2020-09-02 LAB — GLUCOSE, CAPILLARY
Glucose-Capillary: 154 mg/dL — ABNORMAL HIGH (ref 70–99)
Glucose-Capillary: 154 mg/dL — ABNORMAL HIGH (ref 70–99)
Glucose-Capillary: 124 mg/dL — ABNORMAL HIGH (ref 70–99)
Glucose-Capillary: 188 mg/dL — ABNORMAL HIGH (ref 70–99)

## 2020-09-02 LAB — BASIC METABOLIC PANEL
Anion gap: 8 (ref 5–15)
BUN: 43 mg/dL — ABNORMAL HIGH (ref 8–23)
CO2: 26 mmol/L (ref 22–32)
Calcium: 9.4 mg/dL (ref 8.9–10.3)
Chloride: 104 mmol/L (ref 98–111)
Creatinine, Ser: 2.02 mg/dL — ABNORMAL HIGH (ref 0.44–1.00)
GFR, Estimated: 27 mL/min — ABNORMAL LOW (ref >60)
Glucose, Bld: 166 mg/dL — ABNORMAL HIGH (ref 70–99)
Potassium: 4.2 mmol/L (ref 3.5–5.1)
Sodium: 138 mmol/L (ref 135–145)

## 2020-09-02 LAB — CBC
HCT: 27.5 % — ABNORMAL LOW (ref 36.0–46.0)
Hemoglobin: 9.2 g/dL — ABNORMAL LOW (ref 12.0–15.0)
MCH: 35.8 pg — ABNORMAL HIGH (ref 26.0–34.0)
MCHC: 33.5 g/dL (ref 30.0–36.0)
MCV: 107 fL — ABNORMAL HIGH (ref 80.0–100.0)
Platelets: 83 10*3/uL — ABNORMAL LOW (ref 150–400)
RBC: 2.57 MIL/uL — ABNORMAL LOW (ref 3.87–5.11)
RDW: 14.9 % (ref 11.5–15.5)
WBC: 5.2 10*3/uL (ref 4.0–10.5)
nRBC: 1.7 % — ABNORMAL HIGH (ref 0.0–0.2)

## 2020-09-02 LAB — ZINC: Zinc: 82 ug/dL (ref 44–115)

## 2020-09-02 LAB — COPPER, SERUM: Copper: 143 ug/dL (ref 80–158)

## 2020-09-02 LAB — MAGNESIUM: Magnesium: 2.3 mg/dL (ref 1.7–2.4)

## 2020-09-02 IMAGING — US US RENAL
1 series · 14 of 25 positions shown · non-contrast
Comparison: None.

CLINICAL DATA: Acute kidney injury.

EXAM:
RENAL / URINARY TRACT ULTRASOUND COMPLETE

[Series 1: us renal · 14 of 49 slices shown]
[im 1/49]
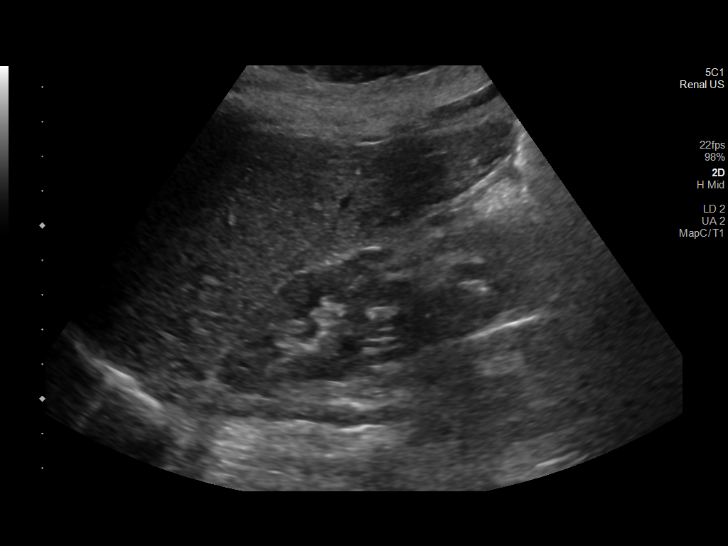
[im 5/49]
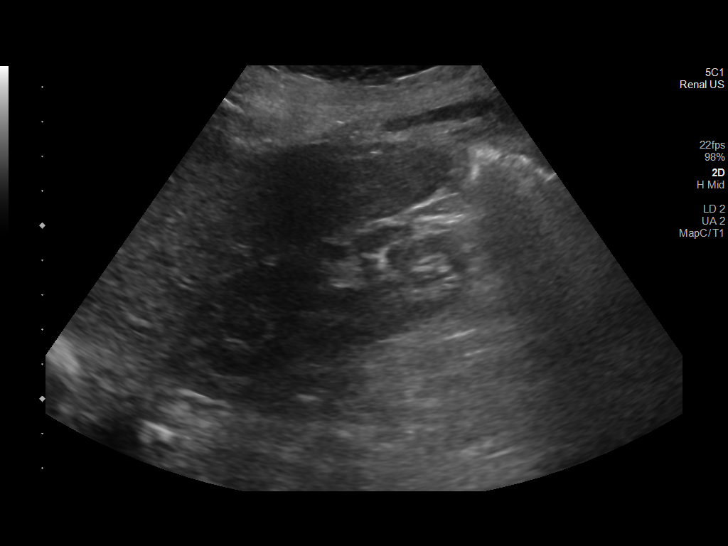
[im 9/49]
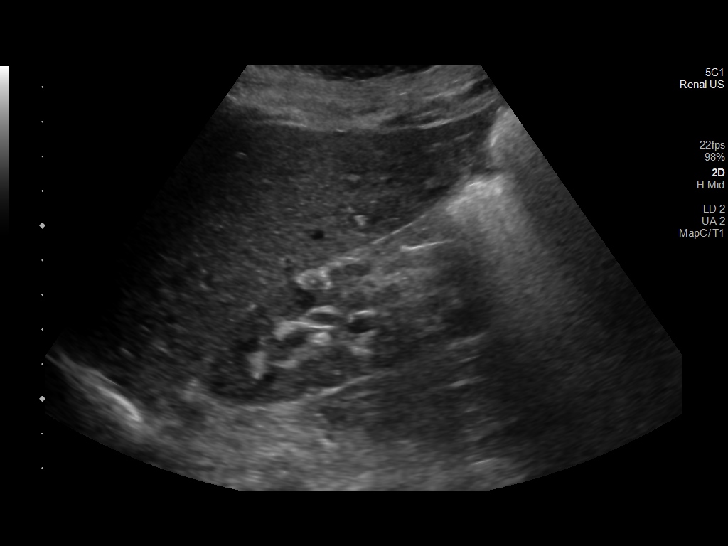
[im 13/49]
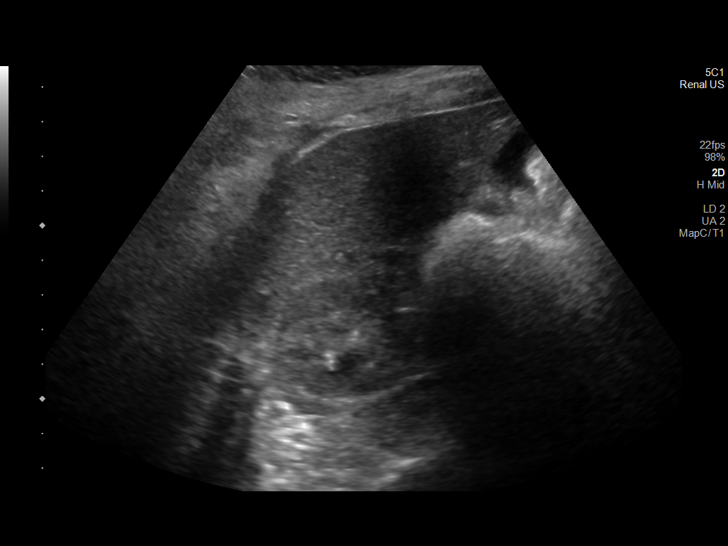
[im 17/49]
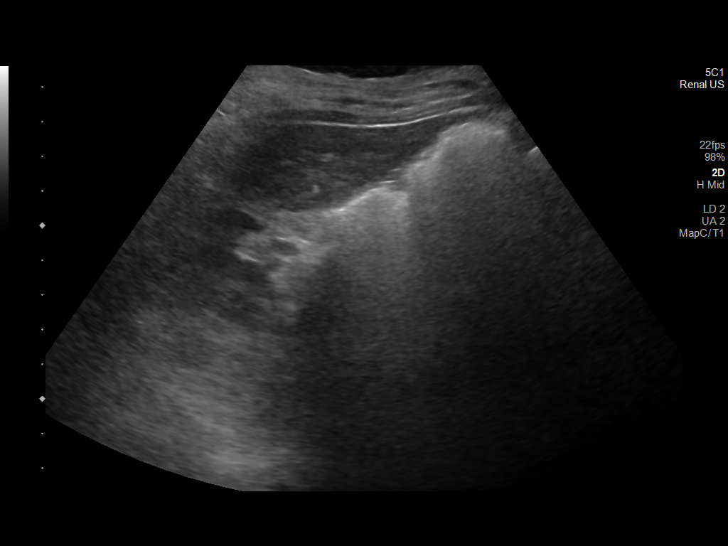
[im 19/49]
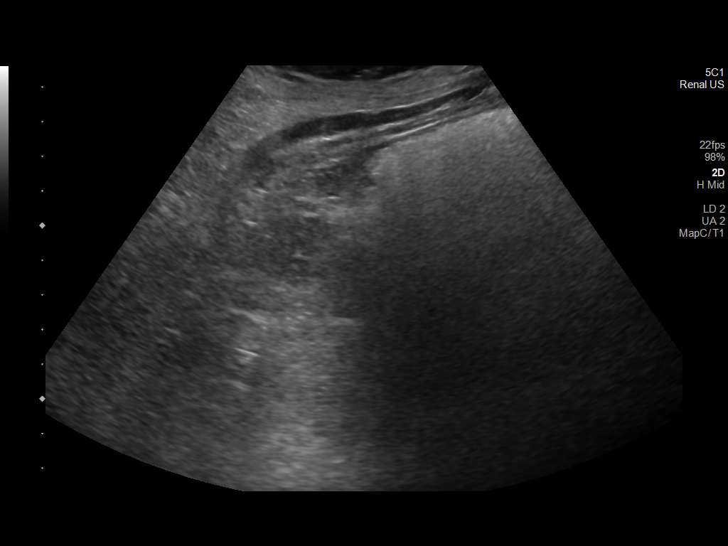
[im 23/49]
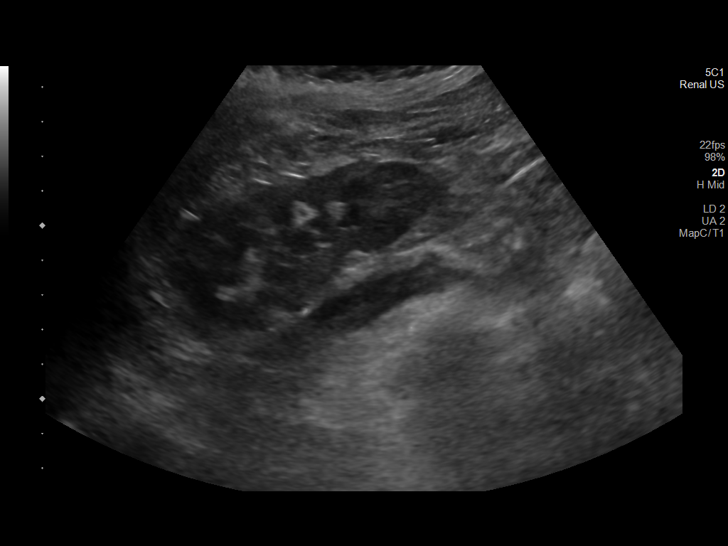
[im 27/49]
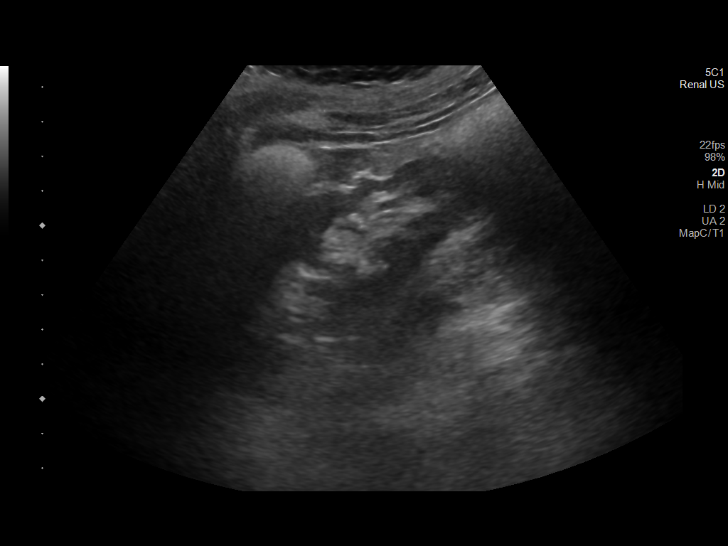
[im 31/49]
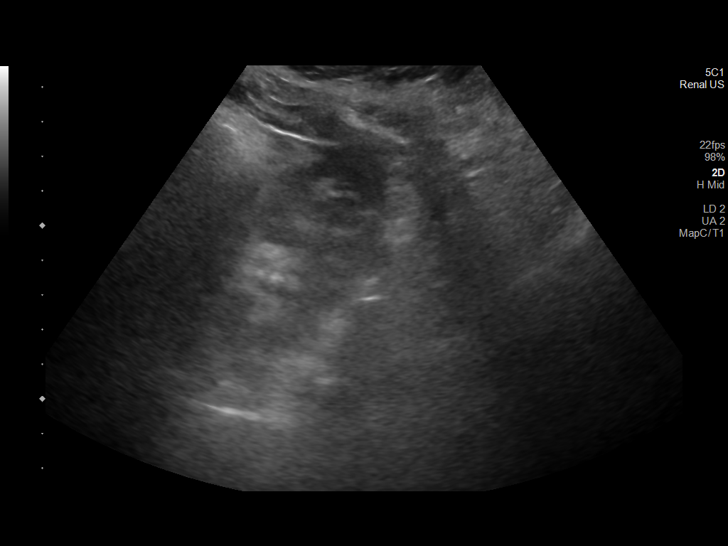
[im 33/49]
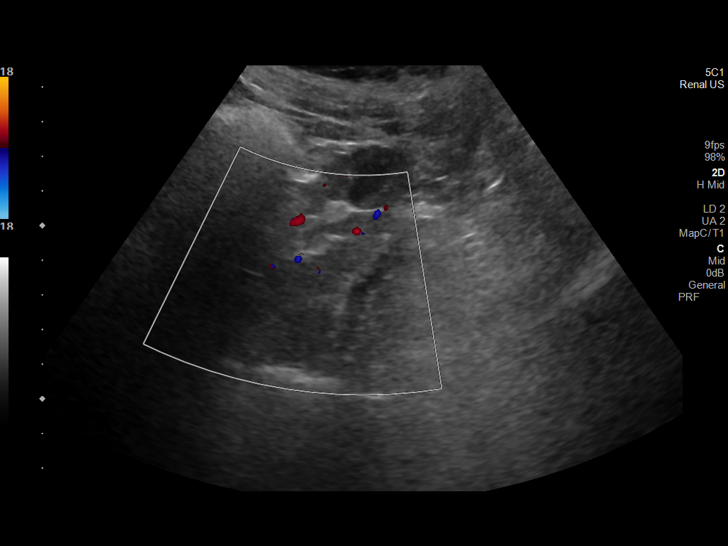
[im 37/49]
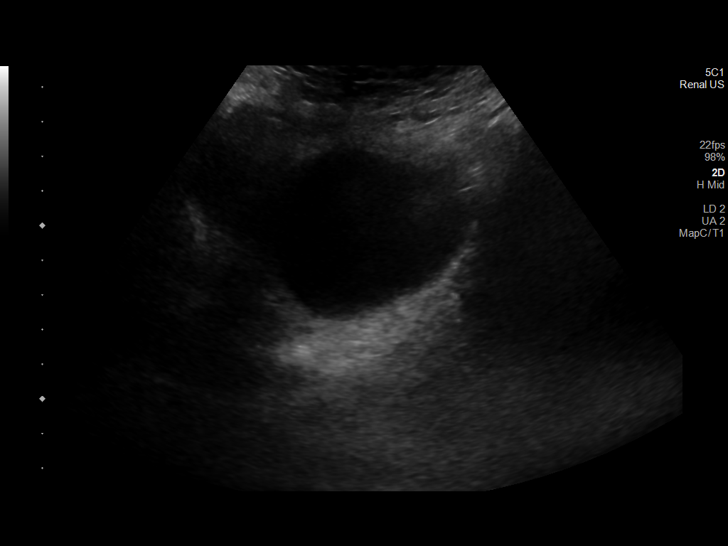
[im 41/49]
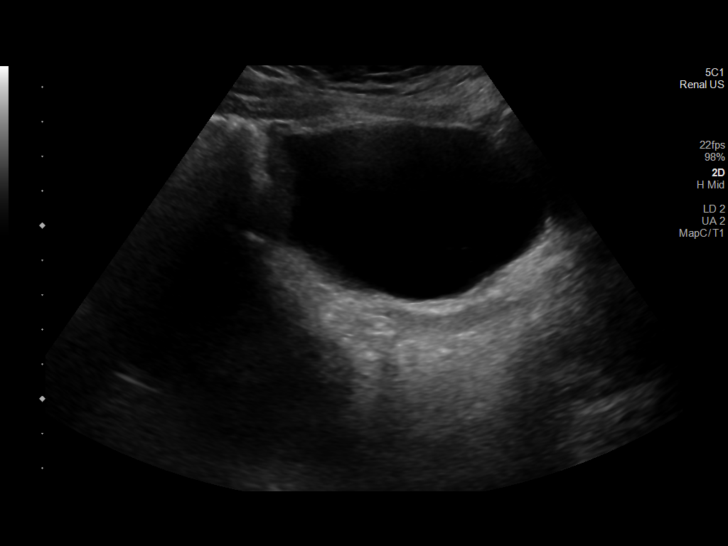
[im 45/49]
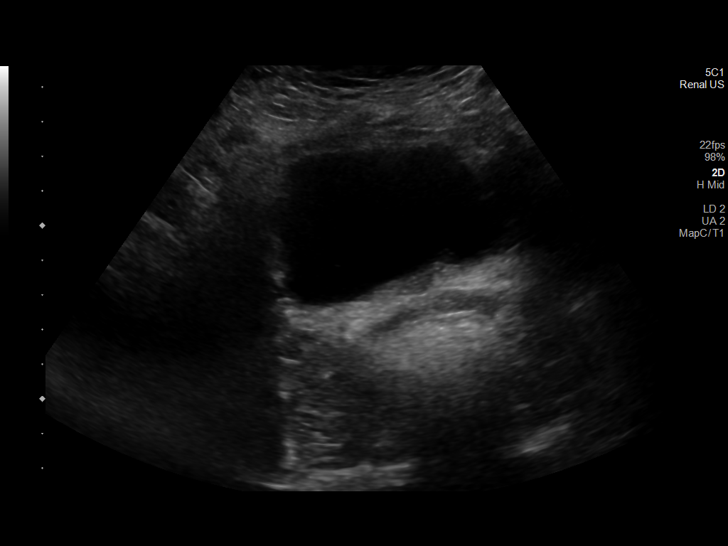
[im 49/49]
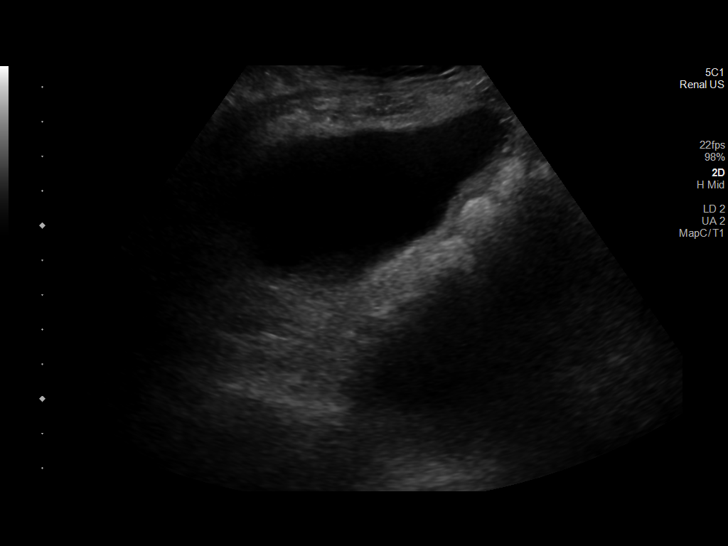

[14 of 25 positions shown; findings below may reference images not displayed]

FINDINGS: Right Kidney:

Renal measurements: 9.9 x 3.3 x 3.4 cm = volume: 57.3 mL. Normal
renal parenchymal echogenicity. Cortical thinning suggested of the
mid to lower pole. No mass or stone. No hydronephrosis.

Left Kidney:

Renal measurements: 8.5 x 3.7 x 3.5 cm = volume: 57.9 mL. Normal
parenchymal echogenicity. There is cortical thinning along the lower
pole consistent with scarring. No mass, stone or hydronephrosis.

Bladder:

Appears normal for degree of bladder distention.

Other:

None.
IMPRESSION: 1. No acute findings. No hydronephrosis. No renal masses or stones.
2. Bilateral renal cortical thinning most evident along the lower
poles, appearance consistent with chronic scarring. Overall mild
reduction in bilateral renal sizes.

## 2020-09-02 MED ORDER — SODIUM CHLORIDE 0.9 % IV SOLN: INTRAVENOUS | Status: AC

## 2020-09-02 MED ORDER — INSULIN ASPART PROT & ASPART (70-30 MIX) 100 UNIT/ML ~~LOC~~ SUSP
10.0000 [IU] | Freq: Two times a day (BID) | SUBCUTANEOUS | Status: DC
  Administered 2020-09-02 – 2020-09-03 (×4): 10 [IU] via SUBCUTANEOUS
  Filled 2020-09-02: qty 10

## 2020-09-02 NOTE — Progress Notes (Signed)
PROGRESS NOTE    Diane Willis  VVO:[id number] DOB: 18-Nov-1955 DOA: 08/30/2020 PCP: Fleet Contras, MD   Brief Narrative:  64-year with history of DM2, HTN, HLD admitted for progressive bilateral lower extremity weakness, numbness and tingling for the past 3 weeks.  Patient underwent extensive work-up including CT head and MRI spine which showed some demyelination of her spine consistent with finding of vitamin B12 deficiency.  Started on B12 supplements.  Neurology team was following.  It was not creatinine she had to receive IV fluid during hospitalization.   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 Admit Cr 1.5 >1.14>1.41>2.02. DC Norvasc, lisinopril and Tradjenta due to borderline low blood pressure.  Renal ultrasound, IV fluids.  Vitamin B12 deficiency Bilateral lower extremity weakness, numbness and tingling - B12 supplements -Intrinsic factor antibody-pending -THC-normal, RPR-nonreactive, A1c, 7.8, HIV-negative   Macrocytic anemia/Thrombocytopenia-secondary to B12 deficiency  Essential hypertension-on lisinopril and HCTZ on hold  Insulin-dependent diabetes mellitus type 2 uncontrolled secondary to hyperglycemia -sliding scale and Accu-Chek.  Due to low blood blood glucose, reduce 7030 to 10 units daily.  Discontinue Tradjenta.   Hyperlipidemia-statin      PT/OT pending.    DVT prophylaxis: SCDs Start: 08/31/20 0547 Code Status: Full  Family Communication:    Status is: Inpatient  Remains inpatient appropriate because:Inpatient level of care appropriate due to severity of illness  Dispo: The patient is from: Home              Anticipated d/c is to: CIR              Patient currently is not medically stable to d/c.  Maintain hospital stay due to worsening renal  function.  Rehab team consulted   Difficult to place patient No     Subjective: States she feels okay this morning, no new complaints besides weakness.  Review of Systems Otherwise negative except as per HPI, including: General: Denies fever, chills, night sweats or unintended weight loss. Resp: Denies cough, wheezing, shortness of breath. Cardiac: Denies chest pain, palpitations, orthopnea, paroxysmal nocturnal dyspnea. GI: Denies abdominal pain, nausea, vomiting, diarrhea or constipation GU: Denies dysuria, frequency, hesitancy or incontinence MS: Denies muscle aches, joint pain or swelling Neuro: Denies headache, neurologic deficits (focal weakness, numbness, tingling), abnormal gait Psych: Denies anxiety, depression, SI/HI/AVH Skin: Denies new rashes or lesions ID: Denies sick contacts, exotic exposures, travel  Examination: Constitutional: Not in acute distress Respiratory: Clear to auscultation bilaterally Cardiovascular: Normal sinus rhythm, no rubs Abdomen: Nontender nondistended good bowel sounds Musculoskeletal: No edema noted Skin: No rashes seen Neurologic: CN 2-12 grossly intact.  And nonfocal Psychiatric: Normal judgment and insight. Alert and oriented x 3. Normal mood.   Objective: Vitals:   09/01/20 2055 09/02/20 0019 09/02/20 0455 09/02/20 0809  BP: (!) 82/49 (!) 87/46 (!) 102/54 (!) 101/55  Pulse: 66 61 (!) 58 (!) 59  Resp: 17 18 18  (!) 22  Temp: (!) 97.4 F (36.3 C) 97.8 F (36.6 C) 97.9 F (36.6 C) 98.4 F (36.9 C)  TempSrc: Oral Oral Oral Oral  SpO2: 99% 100% 100% 100%  Weight:      Height:        Intake/Output Summary (Last 24 hours) at 09/02/2020 1148 Last data filed at 09/01/2020 1700 Gross per 24 hour  Intake 240  ml  Output 200 ml  Net 40 ml   Filed Weights   08/30/20 1555 08/31/20 2112  Weight: 76.2 kg 79.7 kg     Data Reviewed:   CBC: Recent Labs  Lab 08/30/20 1644 09/01/20 0208 09/02/20 0200  WBC 3.7* 3.4* 5.2   NEUTROABS 2.4  --   --   HGB 9.7* 8.5* 9.2*  HCT 28.3* 25.0* 27.5*  MCV 105.6* 106.4* 107.0*  PLT 98* 99* 83*   Basic Metabolic Panel: Recent Labs  Lab 08/30/20 1644 08/31/20 0607 09/01/20 0208 09/02/20 0200  NA 140  --  133* 138  K 3.4*  --  4.5 4.2  CL 104  --  102 104  CO2 25  --  21* 26  GLUCOSE 135*  --  366* 166*  BUN 30*  --  27* 43*  CREATININE 1.14*  --  1.41* 2.02*  CALCIUM 9.6  --  8.7* 9.4  MG  --  2.0 1.9 2.3   GFR: Estimated Creatinine Clearance: 27.6 mL/min (A) (by C-G formula based on SCr of 2.02 mg/dL (H)). Liver Function Tests: Recent Labs  Lab 08/30/20 1644  AST 25  ALT 31  ALKPHOS 66  BILITOT 1.0  PROT 7.2  ALBUMIN 4.3   No results for input(s): LIPASE, AMYLASE in the last 168 hours. No results for input(s): AMMONIA in the last 168 hours. Coagulation Profile: No results for input(s): INR, PROTIME in the last 168 hours. Cardiac Enzymes: No results for input(s): CKTOTAL, CKMB, CKMBINDEX, TROPONINI in the last 168 hours. BNP (last 3 results) No results for input(s): PROBNP in the last 8760 hours. HbA1C: Recent Labs    08/31/20 0607  HGBA1C 7.8*   CBG: Recent Labs  Lab 09/01/20 1747 09/01/20 2124 09/01/20 2221 09/01/20 2311 09/02/20 0622  GLUCAP 111* 55* 64* 100* 154*   Lipid Profile: No results for input(s): CHOL, HDL, LDLCALC, TRIG, CHOLHDL, LDLDIRECT in the last 72 hours. Thyroid Function Tests: Recent Labs    08/31/20 0607  TSH 1.693   Anemia Panel: Recent Labs    08/31/20 0607  VITAMINB12 <50*  FOLATE 50.5   Sepsis Labs: No results for input(s): PROCALCITON, LATICACIDVEN in the last 168 hours.  Recent Results (from the past 240 hour(s))  Resp Panel by RT-PCR (Flu A&B, Covid) Nasopharyngeal Swab     Status: None   Collection Time: 08/30/20  6:10 PM   Specimen: Nasopharyngeal Swab; Nasopharyngeal(NP) swabs in vial transport medium  Result Value Ref Range Status   SARS Coronavirus 2 by RT PCR NEGATIVE NEGATIVE  Final    Comment: (NOTE) SARS-CoV-2 target nucleic acids are NOT DETECTED.  The SARS-CoV-2 RNA is generally detectable in upper respiratory specimens during the acute phase of infection. The lowest concentration of SARS-CoV-2 viral copies this assay can detect is 138 copies/mL. A negative result does not preclude SARS-Cov-2 infection and should not be used as the sole basis for treatment or other patient management decisions. A negative result may occur with  improper specimen collection/handling, submission of specimen other than nasopharyngeal swab, presence of viral mutation(s) within the areas targeted by this assay, and inadequate number of viral copies(<138 copies/mL). A negative result must be combined with clinical observations, patient history, and epidemiological information. The expected result is Negative.  Fact Sheet for Patients:  BloggerCourse.com  Fact Sheet for Healthcare Providers:  SeriousBroker.it  This test is no t yet approved or cleared by the [place] FDA and  has been authorized for detection and/or diagnosis of  SARS-CoV-2 by FDA under an Emergency Use Authorization (EUA). This EUA will remain  in effect (meaning this test can be used) for the duration of the COVID-19 declaration under Section 564(b)(1) of the Act, 21 U.S.C.section 360bbb-3(b)(1), unless the authorization is terminated  or revoked sooner.       Influenza A by PCR NEGATIVE NEGATIVE Final   Influenza B by PCR NEGATIVE NEGATIVE Final    Comment: (NOTE) The Xpert Xpress SARS-CoV-2/FLU/RSV plus assay is intended as an aid in the diagnosis of influenza from Nasopharyngeal swab specimens and should not be used as a sole basis for treatment. Nasal washings and aspirates are unacceptable for Xpert Xpress SARS-CoV-2/FLU/RSV testing.  Fact Sheet for Patients: BloggerCourse.com  Fact Sheet for Healthcare  Providers: SeriousBroker.it  This test is not yet approved or cleared by the [place] FDA and has been authorized for detection and/or diagnosis of SARS-CoV-2 by FDA under an Emergency Use Authorization (EUA). This EUA will remain in effect (meaning this test can be used) for the duration of the COVID-19 declaration under Section 564(b)(1) of the Act, 21 U.S.C. section 360bbb-3(b)(1), unless the authorization is terminated or revoked.  Performed at [facility], [address] [place], [place] [address]          Radiology Studies: No results found.      Scheduled Meds:  aspirin EC  81 mg Oral Daily   cyanocobalamin  1,000 mcg Intramuscular Daily   Followed by   Melene Muller ON 09/07/2020] cyanocobalamin  1,000 mcg Intramuscular Weekly   insulin aspart  0-9 Units Subcutaneous TID WC   insulin aspart protamine- aspart  10 Units Subcutaneous BID WC   simvastatin  40 mg Oral QHS   thiamine injection  100 mg Intravenous Daily   Or   thiamine  100 mg Oral Daily   Continuous Infusions:  sodium chloride 100 mL/hr at 09/02/20 1012   sodium chloride       LOS: 2 days   Time spent= 35 mins    Shaliah Wann Joline Maxcy, MD [facility]  If 7PM-7AM, please contact night-coverage  09/02/2020, 11:48 AM

## 2020-09-02 NOTE — Progress Notes (Cosign Needed)
Neurology Progress Note  S: Patient states she is feeling better today. Says she feels a little stronger. Has not been up with PT, but has been doing exercises in the bed. No n/v/d, HA, fever, chills, pain, numbness/tingling, vision changes, or weakness in one particular extremity. Hypoglycemic overnight.   Before 3 weeks ago, patient was completely independent.   O: Current vital signs: BP (!) 101/55 (BP Location: Right Arm)   Pulse (!) 59   Temp 98.4 F (36.9 C) (Oral)   Resp (!) 22   Ht 5\' 1"  (1.549 m)   Wt 79.7 kg   SpO2 100%   BMI 33.20 kg/m  Vital signs in last 24 hours: Temp:  [97.4 F (36.3 C)-98.9 F (37.2 C)] 98.4 F (36.9 C) (08/21 0809) Pulse Rate:  [58-82] 59 (08/21 0809) Resp:  [14-22] 22 (08/21 0809) BP: (82-124)/(46-55) 101/55 (08/21 0809) SpO2:  [99 %-100 %] 100 % (08/21 0809)  GENERAL: Well appearing female. Awake, alert in NAD. HEENT: Normocephalic and atraumatic. LUNGS: Normal respiratory effort.  Ext: warm. SCDs on.   NEURO:  Mental Status: Alert  and oriented x4. Follows commands.  Speech/Language: speech is without aphasia or dysarthria.  Naming, repetition, fluency, and comprehension intact.  Cranial Nerves:  II: PERRL. Visual fields full.  III, IV, VI: EOMI. Eyelids elevate symmetrically.  V: Sensation is intact to light touch and symmetrical to face.  VII: Smile is symmetrical. Able to puff cheeks and raise eyebrows.  VIII: hearing intact to voice. IX, X: Palate elevates symmetrically. Phonation is normal.  09-26-1984 shrug 5/5. XII: tongue is midline without fasciculations. Motor:  5/5 throughout.  Tone: is normal and bulk is normal. Sensation- Intact to light touch bilaterally. Extinction absent to DSS.  Vibration is intact to hands, fingers, wrists, knees, ankles and toes.  Coordination: FTN intact bilaterally with possible slight ataxia. No drift. Proprioception is intact today.  DTRs:  2+ throughout.  Gait-  deferred.  Medications  Current Facility-Administered Medications:    0.9 %  sodium chloride infusion, , Intravenous, Continuous, Amin, Ankit Chirag, MD   acetaminophen (TYLENOL) tablet 650 mg, 650 mg, Oral, Q6H PRN, 650 mg at 09/01/20 0113 **OR** acetaminophen (TYLENOL) suppository 650 mg, 650 mg, Rectal, Q6H PRN, 09/03/20, MD   ALPRAZolam Eduard Clos) tablet 0.25 mg, 0.25 mg, Oral, TID PRN, Prudy Feeler, MD   aspirin EC tablet 81 mg, 81 mg, Oral, Daily, Eduard Clos, MD, 81 mg at 09/01/20 1159   cyanocobalamin ((VITAMIN B-12)) injection 1,000 mcg, 1,000 mcg, Intramuscular, Daily, 1,000 mcg at 09/01/20 1202 **FOLLOWED BY** [START ON 09/07/2020] cyanocobalamin ((VITAMIN B-12)) injection 1,000 mcg, 1,000 mcg, Intramuscular, Weekly, 09/09/2020, MD   docusate sodium (COLACE) capsule 100 mg, 100 mg, Oral, BID PRN, Erick Blinks, MD, 100 mg at 08/31/20 09/02/20   hydrALAZINE (APRESOLINE) injection 10 mg, 10 mg, Intravenous, Q4H PRN, 0923, MD   hydrOXYzine (ATARAX/VISTARIL) tablet 25 mg, 25 mg, Oral, Q6H PRN, Eduard Clos, MD   insulin aspart (novoLOG) injection 0-9 Units, 0-9 Units, Subcutaneous, TID WC, Eduard Clos, MD, 2 Units at 09/02/20 09/04/20   insulin aspart protamine- aspart (NOVOLOG MIX 70/30) injection 10 Units, 10 Units, Subcutaneous, BID WC, Amin, Ankit Chirag, MD   simvastatin (ZOCOR) tablet 40 mg, 40 mg, Oral, QHS, 3007, MD, 40 mg at 09/01/20 2257   sodium chloride 0.9 % bolus 500 mL, 500 mL, Intravenous, Once, Amin, Ankit Chirag, MD   thiamine (B-1) injection 100 mg, 100 mg,  Intravenous, Daily, 100 mg at 08/31/20 0847 **OR** thiamine tablet 100 mg, 100 mg, Oral, Daily, Erick Blinks, MD, 100 mg at 09/01/20 1158  Pertinent Labs B12 less than 50. Folate normal. Homocysteine over 250. TSH normal. RPR -, HIV NR, copper and zinc pending.   No new imaging  Assessment: 65 yo female presenting with gait  disturbance and BLE numbness. Found to have subacute combined degeneration on C and T spine imaging (dorsal column syndrome). Lab results consistent with severe Vit B12 deficiency and this is being supplemented. Her exam is improved over consult note. Do not think LP is necessary since her Vitamin labs are low and likely the etiology of her presentation. She has no meningeal signs on exam. Agree with CIR. Copper and Zinc are pending, but we do not have to be concerned as we already have an etiology  Impression: -Dorsal column syndrome.  -severe B12 deficiency, supplementing.  -gait disturbance secondary to above.   Recommendations/Plan:  -f/up intrinsic factor Ab.   -Continue B12 supplementation as ordered, but start IM monthly after orders on chart are complete and continue lifetime.  -Continue Thiamine supplementation. -PT/OT, recommendation is for CIR. Agree.  -Neurology will be available for questions.    Pt seen by Jimmye Norman, MSN, APN-BC/Nurse Practitioner/Neuro.   Pager: [phone]

## 2020-09-03 DIAGNOSIS — E538 Deficiency of other specified B group vitamins: Secondary | ICD-10-CM | POA: Diagnosis not present

## 2020-09-03 DIAGNOSIS — G320 Subacute combined degeneration of spinal cord in diseases classified elsewhere: Secondary | ICD-10-CM | POA: Diagnosis not present

## 2020-09-03 LAB — BASIC METABOLIC PANEL
Anion gap: 7 (ref 5–15)
BUN: 50 mg/dL — ABNORMAL HIGH (ref 8–23)
CO2: 22 mmol/L (ref 22–32)
Calcium: 8.7 mg/dL — ABNORMAL LOW (ref 8.9–10.3)
Chloride: 109 mmol/L (ref 98–111)
Creatinine, Ser: 1.68 mg/dL — ABNORMAL HIGH (ref 0.44–1.00)
GFR, Estimated: 34 mL/min — ABNORMAL LOW (ref >60)
Glucose, Bld: 264 mg/dL — ABNORMAL HIGH (ref 70–99)
Potassium: 4.9 mmol/L (ref 3.5–5.1)
Sodium: 138 mmol/L (ref 135–145)

## 2020-09-03 LAB — INTRINSIC FACTOR ANTIBODIES: Intrinsic Factor: 214.8 AU/mL — ABNORMAL HIGH (ref 0.0–1.1)

## 2020-09-03 LAB — GLUCOSE, CAPILLARY
Glucose-Capillary: 307 mg/dL — ABNORMAL HIGH (ref 70–99)
Glucose-Capillary: 259 mg/dL — ABNORMAL HIGH (ref 70–99)
Glucose-Capillary: 195 mg/dL — ABNORMAL HIGH (ref 70–99)
Glucose-Capillary: 175 mg/dL — ABNORMAL HIGH (ref 70–99)
Glucose-Capillary: 215 mg/dL — ABNORMAL HIGH (ref 70–99)

## 2020-09-03 LAB — CBC
HCT: 25.8 % — ABNORMAL LOW (ref 36.0–46.0)
Hemoglobin: 8.4 g/dL — ABNORMAL LOW (ref 12.0–15.0)
MCH: 35.3 pg — ABNORMAL HIGH (ref 26.0–34.0)
MCHC: 32.6 g/dL (ref 30.0–36.0)
MCV: 108.4 fL — ABNORMAL HIGH (ref 80.0–100.0)
Platelets: 77 10*3/uL — ABNORMAL LOW (ref 150–400)
RBC: 2.38 MIL/uL — ABNORMAL LOW (ref 3.87–5.11)
RDW: 15.5 % (ref 11.5–15.5)
WBC: 5.9 10*3/uL (ref 4.0–10.5)
nRBC: 6.7 % — ABNORMAL HIGH (ref 0.0–0.2)

## 2020-09-03 LAB — MAGNESIUM: Magnesium: 2.2 mg/dL (ref 1.7–2.4)

## 2020-09-03 MED ORDER — ONDANSETRON HCL 4 MG/2ML IJ SOLN
4.0000 mg | Freq: Four times a day (QID) | INTRAMUSCULAR | Status: DC | PRN
  Administered 2020-09-03: 4 mg via INTRAVENOUS
  Filled 2020-09-03: qty 2

## 2020-09-03 NOTE — Progress Notes (Signed)
Physical Therapy Treatment Patient Details Name: Diane Willis MRN: [id number] DOB: 12/24/1955 Today's Date: 09/03/2020    History of Present Illness 65 y/o female presented to ED on 8/18 with bilateral LE numbness and difficulty walking x 3 weeks. 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 Suspect due to vitamin B 12 deficiency. PMH: COVID, DM type 2, HTN    PT Comments    Pt with increased anxiety this session. She reports intense feeling of falling during positional changes to sidelying and then sitting up. Pt denies feeling like she is spinning or the room is spinning, just a feeling of falling. Because of this, pt required up to +2 max assist to prevent pt sliding off EOB (extending LE's out and extending trunk backwards). Pt began screaming while sitting EOB. Husband on speaker phone throughout session but did not attempt to communicate with her. We were unable to progress to OOB mobility this session. Will continue to follow and progress as able per POC.     Follow Up Recommendations  CIR     Equipment Recommendations  Other (comment) (TBD)    Recommendations for Other Services Rehab consult     Precautions / Restrictions Precautions Precautions: Fall Precaution Comments: very ataxic, sensation of falling, screaming during session due to fear Restrictions Weight Bearing Restrictions: No    Mobility  Bed Mobility Overal bed mobility: Needs Assistance Bed Mobility: Supine to Sit;Sit to Supine     Supine to sit: Mod assist;+2 for physical assistance Sit to supine: Total assist;+2 for physical assistance   General bed mobility comments: Pt able to advance LE's towards EOB (to the L) with min assist, however required increased assist to elevate trunk to full sitting position. Pt with fear of  falling and reports she feels like she is falling even though she was not moving at the time. +2 total assist required for return to supine.    Transfers                 General transfer comment: Pt unable to attempt standing at this time.  Ambulation/Gait             General Gait Details: Unable to attempt   Stairs             Wheelchair Mobility    Modified Rankin (Stroke Patients Only)       Balance Overall balance assessment: Needs assistance Sitting-balance support: Bilateral upper extremity supported;Feet supported Sitting balance-Leahy Scale: Zero Sitting balance - Comments: requires up to +2 max A to maintain balance due to truncal ataxia, feeling of falling, and difficulty maintaining LE placement. Postural control: Posterior lean                                  Cognition Arousal/Alertness: Awake/alert Behavior During Therapy: WFL for tasks assessed/performed Overall Cognitive Status: Impaired/Different from baseline Area of Impairment: Following commands;Problem solving;Awareness;Safety/judgement                       Following Commands: Follows one step commands inconsistently;Follows one step commands with increased time Safety/Judgement: Decreased awareness of deficits Awareness: Emergent Problem Solving: Slow processing;Difficulty sequencing;Requires verbal cues;Requires tactile cues General Comments: slow processing and following commands inconsistently. requires increased time to perform tasks. Decreased awareness  into deficits      Exercises      General Comments        Pertinent Vitals/Pain Pain Assessment: No/denies pain    Home Living                      Prior Function            PT Goals (current goals can now be found in the care plan section) Acute Rehab PT Goals Patient Stated Goal: Get better and return home PT Goal Formulation: With patient Time For Goal Achievement:  09/15/20 Potential to Achieve Goals: Fair Progress towards PT goals: Progressing toward goals    Frequency    Min 3X/week      PT Plan Current plan remains appropriate    Co-evaluation              AM-PAC PT "6 Clicks" Mobility   Outcome Measure  Help needed turning from your back to your side while in a flat bed without using bedrails?: Total Help needed moving from lying on your back to sitting on the side of a flat bed without using bedrails?: Total Help needed moving to and from a bed to a chair (including a wheelchair)?: Total Help needed standing up from a chair using your arms (e.g., wheelchair or bedside chair)?: Total Help needed to walk in hospital room?: Total Help needed climbing 3-5 steps with a railing? : Total 6 Click Score: 6    End of Session   Activity Tolerance: Other (comment) (Limited by sensation of falling and fear) Patient left: in bed;with call bell/phone within reach;with bed alarm set Nurse Communication: Mobility status PT Visit Diagnosis: Unsteadiness on feet (R26.81);Muscle weakness (generalized) (M62.81);Ataxic gait (R26.0);Other abnormalities of gait and mobility (R26.89);Other symptoms and signs involving the nervous system (R29.898)     Time: 5631-4970 PT Time Calculation (min) (ACUTE ONLY): 31 min  Charges:  $Therapeutic Activity: 23-37 mins                     Conni Slipper, PT, DPT Acute Rehabilitation Services Pager: [phone] Office: [phone]    [name] 09/03/2020, 12:17 PM

## 2020-09-03 NOTE — Progress Notes (Signed)
Inpatient Diabetes Program Recommendations  AACE/ADA: New Consensus Statement on Inpatient Glycemic Control (2015)  Target Ranges:  Prepandial:   less than 140 mg/dL      Peak postprandial:   less than 180 mg/dL (1-2 hours)      Critically ill patients:  140 - 180 mg/dL   Lab Results  Component Value Date   GLUCAP 195 (H) 09/03/2020   HGBA1C 7.8 (H) 08/31/2020    Review of Glycemic Control Results for ABBIGAEL, DETLEFSEN (MRN [id number]) as of 09/03/2020 13:14  Ref. Range 09/02/2020 21:40 09/03/2020 06:07 09/03/2020 08:13 09/03/2020 11:01  Glucose-Capillary Latest Ref Range: 70 - 99 mg/dL 947 (H) 654 (H) 650 (H) 195 (H)   Diabetes history: Type 2 DM Outpatient Diabetes medications: Novolog 70/30 30 units QAM, 25 units QP, Metformin 500 mg BID, Januvia 100 mg QD Current orders for Inpatient glycemic control: Novolog 70/30 10 units BID, Novolog 0-9 units TID  Inpatient Diabetes Program Recommendations:    Assuming patient is eating well, consider increasing Novolog 70/30 to 14 units BID.   Thanks, Lujean Rave, MSN, RNC-OB Diabetes Coordinator [phone] (8a-5p)

## 2020-09-03 NOTE — Progress Notes (Signed)
[facility] night shift telemetry coverage note.  The nursing staff reported that the patient requested something for nausea.  Ondansetron 4 mg IVP ordered.  Sanda Klein, MD.

## 2020-09-03 NOTE — Progress Notes (Signed)
Inpatient Rehab Admissions Coordinator:   Per PT/OT,  patient was screened for CIR candidacy by Megan Salon, MS, CCC-SLP  At this time, Pt. Appears to demonstrate medical necessity, functional decline, and ability to tolerate intensity of CIR. Pt. is a potential candidate for CIR. I will  place  order for rehab consult per protocol for full assessment. Please contact me any with questions.  Megan Salon, MS, CCC-SLP Rehab Admissions Coordinator  [phone] (celll) [phone] (office)

## 2020-09-03 NOTE — Progress Notes (Signed)
PROGRESS NOTE    Diane Willis  PYP:[id number] DOB: 1955-07-05 DOA: 08/30/2020 PCP: Fleet Contras, MD    Brief Narrative:  64-year with history of DM2, HTN, HLD admitted for progressive bilateral lower extremity weakness, numbness and tingling for the past 3 weeks.  Patient underwent extensive work-up including CT head and MRI spine which showed some demyelination of her spine consistent with finding of vitamin B12 deficiency.  Started on B12 supplements.  Neurology team was following.  Also developed acute kidney injury.  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  Multifactorial acute kidney injury. Creatinine 1.5-1.14-1.41-2.02-1.68.  Trending down. Remains on isotonic fluid.  Renal ultrasound was essentially normal.  Antihypertensives and lisinopril on hold. Recheck tomorrow morning.  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 Folate normal. Homocysteine over 250. TSH normal. RPR -, HIV NR, copper 143 , Zinc level is 82 Treating with vitamin B12 supplementation. Vitamin B12 1000 mcg daily for 7 days Vitamin B12 1000 mcg weekly for 7 weeks then vitamin B12 1000 mcg every month lifelong.  Continue thiamine supplementation.  Transfer to rehab.  Essential hypertension: On lisinopril and hydrochlorothiazide.  On hold due to AKI.  Will resume next 24 to 48 hours.  Type 2 diabetes, uncontrolled with hyperglycemia: On reduced dose of insulin.  Blood sugars are stable today.  Hyperlipidemia: On a statin.     DVT prophylaxis: SCDs Start: 08/31/20 0547   Code Status: Full code Family Communication: None Disposition Plan: Status is:  Inpatient  Remains inpatient appropriate because:Unsafe d/c plan  Dispo: The patient is from: Home              Anticipated d/c is to: CIR              Patient currently is medically stable to d/c.  Only to rehab level of care.   Difficult to place patient No         Consultants:  Neurology  Procedures:  None  Antimicrobials:  None   Subjective: Patient seen and examined.  She was getting ready to work with therapist.  She thinks her legs are little bit stronger, still needed 2+ max assist to mobilize.  Looking forward for aggressive rehab. Patient was amazed to know that she developed all the symptoms in 3 days.  Objective: Vitals:   09/03/20 0000 09/03/20 0400 09/03/20 0814 09/03/20 1212  BP: (!) 142/63 (!) 113/55 137/65 (!) 101/55  Pulse: 93 77 79 72  Resp: 18 18 18 16   Temp: 99 F (37.2 C) 98.9 F (37.2 C) 98.4 F (36.9 C) 98.7 F (37.1 C)  TempSrc: Oral Oral Oral Oral  SpO2: 99% 99% 100% 100%  Weight:      Height:        Intake/Output Summary (Last 24 hours) at 09/03/2020 1252 Last data filed at 09/03/2020 0100 Gross per 24 hour  Intake --  Output 350 ml  Net -350 ml   Filed Weights   08/30/20 1555 08/31/20 2112  Weight: 76.2 kg 79.7 kg    Examination:  General exam: Appears calm and comfortable  Respiratory system: Clear to auscultation. Respiratory effort normal. Cardiovascular system: S1 & S2 heard, RRR. No JVD,  murmurs, rubs, gallops or clicks. No pedal edema. Gastrointestinal system: Abdomen is nondistended, soft and nontender. No organomegaly or masses felt. Normal bowel sounds heard. Central nervous system: Alert and oriented.  No gross neurological deficits. Gait not examined. On attempted mobility, she had feeling of falling.     Data Reviewed: I have personally reviewed following labs and imaging studies  CBC: Recent Labs  Lab 08/30/20 1644 09/01/20 0208 09/02/20 0200 09/03/20 0232  WBC 3.7* 3.4* 5.2 5.9  NEUTROABS 2.4  --    --   --   HGB 9.7* 8.5* 9.2* 8.4*  HCT 28.3* 25.0* 27.5* 25.8*  MCV 105.6* 106.4* 107.0* 108.4*  PLT 98* 99* 83* 77*   Basic Metabolic Panel: Recent Labs  Lab 08/30/20 1644 08/31/20 0607 09/01/20 0208 09/02/20 0200 09/03/20 0136  NA 140  --  133* 138 138  K 3.4*  --  4.5 4.2 4.9  CL 104  --  102 104 109  CO2 25  --  21* 26 22  GLUCOSE 135*  --  366* 166* 264*  BUN 30*  --  27* 43* 50*  CREATININE 1.14*  --  1.41* 2.02* 1.68*  CALCIUM 9.6  --  8.7* 9.4 8.7*  MG  --  2.0 1.9 2.3 2.2   GFR: Estimated Creatinine Clearance: 33.2 mL/min (A) (by C-G formula based on SCr of 1.68 mg/dL (H)). Liver Function Tests: Recent Labs  Lab 08/30/20 1644  AST 25  ALT 31  ALKPHOS 66  BILITOT 1.0  PROT 7.2  ALBUMIN 4.3   No results for input(s): LIPASE, AMYLASE in the last 168 hours. No results for input(s): AMMONIA in the last 168 hours. Coagulation Profile: No results for input(s): INR, PROTIME in the last 168 hours. Cardiac Enzymes: No results for input(s): CKTOTAL, CKMB, CKMBINDEX, TROPONINI in the last 168 hours. BNP (last 3 results) No results for input(s): PROBNP in the last 8760 hours. HbA1C: No results for input(s): HGBA1C in the last 72 hours. CBG: Recent Labs  Lab 09/02/20 1529 09/02/20 2140 09/03/20 0607 09/03/20 0813 09/03/20 1101  GLUCAP 124* 188* 307* 259* 195*   Lipid Profile: No results for input(s): CHOL, HDL, LDLCALC, TRIG, CHOLHDL, LDLDIRECT in the last 72 hours. Thyroid Function Tests: No results for input(s): TSH, T4TOTAL, FREET4, T3FREE, THYROIDAB in the last 72 hours. Anemia Panel: No results for input(s): VITAMINB12, FOLATE, FERRITIN, TIBC, IRON, RETICCTPCT in the last 72 hours. Sepsis Labs: No results for input(s): PROCALCITON, LATICACIDVEN in the last 168 hours.  Recent Results (from the past 240 hour(s))  Resp Panel by RT-PCR (Flu A&B, Covid) Nasopharyngeal Swab     Status: None   Collection Time: 08/30/20  6:10 PM   Specimen: Nasopharyngeal  Swab; Nasopharyngeal(NP) swabs in vial transport medium  Result Value Ref Range Status   SARS Coronavirus 2 by RT PCR NEGATIVE NEGATIVE Final    Comment: (NOTE) SARS-CoV-2 target nucleic acids are NOT DETECTED.  The SARS-CoV-2 RNA is generally detectable in upper respiratory specimens during the acute phase of infection. The lowest concentration of SARS-CoV-2 viral copies this assay can detect is 138 copies/mL. A negative result does not preclude SARS-Cov-2 infection and should not be used as the sole basis for treatment or other patient management decisions. A negative result may occur with  improper specimen collection/handling, submission of specimen other than nasopharyngeal swab, presence of viral mutation(s) within the areas targeted by this assay, and inadequate number of viral copies(<138 copies/mL). A negative result must be combined with clinical  observations, patient history, and epidemiological information. The expected result is Negative.  Fact Sheet for Patients:  BloggerCourse.com  Fact Sheet for Healthcare Providers:  SeriousBroker.it  This test is no t yet approved or cleared by the [place] FDA and  has been authorized for detection and/or diagnosis of SARS-CoV-2 by FDA under an Emergency Use Authorization (EUA). This EUA will remain  in effect (meaning this test can be used) for the duration of the COVID-19 declaration under Section 564(b)(1) of the Act, 21 U.S.C.section 360bbb-3(b)(1), unless the authorization is terminated  or revoked sooner.       Influenza A by PCR NEGATIVE NEGATIVE Final   Influenza B by PCR NEGATIVE NEGATIVE Final    Comment: (NOTE) The Xpert Xpress SARS-CoV-2/FLU/RSV plus assay is intended as an aid in the diagnosis of influenza from Nasopharyngeal swab specimens and should not be used as a sole basis for treatment. Nasal washings and aspirates are unacceptable for Xpert Xpress  SARS-CoV-2/FLU/RSV testing.  Fact Sheet for Patients: BloggerCourse.com  Fact Sheet for Healthcare Providers: SeriousBroker.it  This test is not yet approved or cleared by the [place] FDA and has been authorized for detection and/or diagnosis of SARS-CoV-2 by FDA under an Emergency Use Authorization (EUA). This EUA will remain in effect (meaning this test can be used) for the duration of the COVID-19 declaration under Section 564(b)(1) of the Act, 21 U.S.C. section 360bbb-3(b)(1), unless the authorization is terminated or revoked.  Performed at [facility], [address] [place], [place] [address]          Radiology Studies: US RENAL  Result Date: 09/02/2020 CLINICAL DATA:  Acute kidney injury. EXAM: RENAL / URINARY TRACT ULTRASOUND COMPLETE COMPARISON:  None. FINDINGS: Right Kidney: Renal measurements: 9.9 x 3.3 x 3.4 cm = volume: 57.3 mL. Normal renal parenchymal echogenicity. Cortical thinning suggested of the mid to lower pole. No mass or stone. No hydronephrosis. Left Kidney: Renal measurements: 8.5 x 3.7 x 3.5 cm = volume: 57.9 mL. Normal parenchymal echogenicity. There is cortical thinning along the lower pole consistent with scarring. No mass, stone or hydronephrosis. Bladder: Appears normal for degree of bladder distention. Other: None. IMPRESSION: 1. No acute findings. No hydronephrosis. No renal masses or stones. 2. Bilateral renal cortical thinning most evident along the lower poles, appearance consistent with chronic scarring. Overall mild reduction in bilateral renal sizes. Electronically Signed   By: Amie Portland M.D.   On: 09/02/2020 12:14        Scheduled Meds:  aspirin EC  81 mg Oral Daily   cyanocobalamin  1,000 mcg Intramuscular Daily   Followed by   Melene Muller ON 09/07/2020] cyanocobalamin  1,000 mcg Intramuscular Weekly   insulin aspart  0-9 Units Subcutaneous TID WC   insulin aspart  protamine- aspart  10 Units Subcutaneous BID WC   simvastatin  40 mg Oral QHS   thiamine injection  100 mg Intravenous Daily   Or   thiamine  100 mg Oral Daily   Continuous Infusions:  sodium chloride       LOS: 3 days    Time spent: 30 minutes    Dorcas Carrow, MD [facility] Pager [phone]

## 2020-09-03 NOTE — Progress Notes (Signed)
Inpatient Rehabilitation Admissions Coordinator   Inpatient rehab consult received. I will complete assessment Tuesday for rehab venue options.  Ottie Glazier, RN, MSN Rehab Admissions Coordinator [phone] 09/03/2020 5:08 PM

## 2020-09-04 DIAGNOSIS — G320 Subacute combined degeneration of spinal cord in diseases classified elsewhere: Secondary | ICD-10-CM | POA: Diagnosis not present

## 2020-09-04 DIAGNOSIS — E538 Deficiency of other specified B group vitamins: Secondary | ICD-10-CM | POA: Diagnosis not present

## 2020-09-04 LAB — GLUCOSE, CAPILLARY
Glucose-Capillary: 361 mg/dL — ABNORMAL HIGH (ref 70–99)
Glucose-Capillary: 233 mg/dL — ABNORMAL HIGH (ref 70–99)
Glucose-Capillary: 200 mg/dL — ABNORMAL HIGH (ref 70–99)
Glucose-Capillary: 198 mg/dL — ABNORMAL HIGH (ref 70–99)

## 2020-09-04 LAB — BASIC METABOLIC PANEL
Anion gap: 4 — ABNORMAL LOW (ref 5–15)
BUN: 44 mg/dL — ABNORMAL HIGH (ref 8–23)
CO2: 23 mmol/L (ref 22–32)
Calcium: 8.6 mg/dL — ABNORMAL LOW (ref 8.9–10.3)
Chloride: 113 mmol/L — ABNORMAL HIGH (ref 98–111)
Creatinine, Ser: 1.34 mg/dL — ABNORMAL HIGH (ref 0.44–1.00)
GFR, Estimated: 45 mL/min — ABNORMAL LOW (ref >60)
Glucose, Bld: 313 mg/dL — ABNORMAL HIGH (ref 70–99)
Potassium: 5.2 mmol/L — ABNORMAL HIGH (ref 3.5–5.1)
Sodium: 140 mmol/L (ref 135–145)

## 2020-09-04 LAB — CBC
HCT: 23.9 % — ABNORMAL LOW (ref 36.0–46.0)
Hemoglobin: 7.9 g/dL — ABNORMAL LOW (ref 12.0–15.0)
MCH: 36.4 pg — ABNORMAL HIGH (ref 26.0–34.0)
MCHC: 33.1 g/dL (ref 30.0–36.0)
MCV: 110.1 fL — ABNORMAL HIGH (ref 80.0–100.0)
Platelets: 68 10*3/uL — ABNORMAL LOW (ref 150–400)
RBC: 2.17 MIL/uL — ABNORMAL LOW (ref 3.87–5.11)
RDW: 16.1 % — ABNORMAL HIGH (ref 11.5–15.5)
WBC: 5 10*3/uL (ref 4.0–10.5)
nRBC: 3.6 % — ABNORMAL HIGH (ref 0.0–0.2)

## 2020-09-04 LAB — MAGNESIUM: Magnesium: 2.2 mg/dL (ref 1.7–2.4)

## 2020-09-04 LAB — PATHOLOGIST SMEAR REVIEW

## 2020-09-04 MED ORDER — INSULIN ASPART PROT & ASPART (70-30 MIX) 100 UNIT/ML ~~LOC~~ SUSP
14.0000 [IU] | Freq: Two times a day (BID) | SUBCUTANEOUS | Status: DC
  Administered 2020-09-04 – 2020-09-05 (×3): 14 [IU] via SUBCUTANEOUS

## 2020-09-04 NOTE — Progress Notes (Signed)
Inpatient Rehabilitation Admissions Coordinator   I met at bedside with patient for rehab assessment.  We discussed goals, expectations, intensity of therapy . She states she is not in pain but very fearful of falling with therapy. That was why she was screaming yesterday. I await further progress with therapy to before proceeding with insurance Auth with [employer] for possible CIR admit.   Danne Baxter, RN, MSN Rehab Admissions Coordinator [phone] 09/04/2020 11:31 AM

## 2020-09-04 NOTE — Plan of Care (Signed)

## 2020-09-04 NOTE — Progress Notes (Addendum)
Inpatient Diabetes Program Recommendations  AACE/ADA: New Consensus Statement on Inpatient Glycemic Control (2015)  Target Ranges:  Prepandial:   less than 140 mg/dL      Peak postprandial:   less than 180 mg/dL (1-2 hours)      Critically ill patients:  140 - 180 mg/dL   Lab Results  Component Value Date   GLUCAP 233 (H) 09/04/2020   HGBA1C 7.8 (H) 08/31/2020    Review of Glycemic Control Results for Diane Willis, Diane Willis (MRN [id number]) as of 09/04/2020 13:16  Ref. Range 09/03/2020 16:34 09/03/2020 21:46 09/04/2020 06:10 09/04/2020 12:09  Glucose-Capillary Latest Ref Range: 70 - 99 mg/dL 102 (H) 585 (H) 277 (H) 233 (H)  Diabetes history: Type 2 DM Outpatient Diabetes medications: Novolog 70/30 30 units QAM, 25 units QP, Metformin 500 mg BID, Januvia 100 mg QD Current orders for Inpatient glycemic control: Novolog 70/30 14 units BID, Novolog 0-9 units TID   Inpatient Diabetes Program Recommendations:     Assuming patient is eating well, consider increasing Novolog 70/30 to 18 units BID and adding Novolog 0-5 units QHS.   Thanks, Lujean Rave, MSN, RNC-OB Diabetes Coordinator [phone] (8a-5p)

## 2020-09-04 NOTE — Progress Notes (Signed)
PROGRESS NOTE    Diane Willis  ZOX:[id number] DOB: [birth year] DOA: 08/30/2020 PCP: Fleet Contras, MD    Brief Narrative:  64-year with history of DM2, HTN, HLD admitted for progressive bilateral lower extremity weakness, numbness and tingling for the past 3 weeks.  Patient underwent extensive work-up including CT head and MRI spine which showed some demyelination of her spine consistent with finding of vitamin B12 deficiency.  Started on B12 supplements.  Neurology team was following.  Also developed acute kidney injury.  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  Multifactorial acute kidney injury. Creatinine 1.5-1.14-1.41-2.02-1.68-1.34. Trending down. Renal ultrasound was essentially normal.  Antihypertensives and lisinopril on hold. Will discontinue IV fluids and monitor.  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 Folate normal. Homocysteine over 250. TSH normal. RPR -, HIV NR, copper 143 , Zinc level is 82 Treating with vitamin B12 supplementation. Vitamin B12 1000 mcg daily for 7 days Vitamin B12 1000 mcg weekly for 7 weeks then vitamin B12 1000 mcg every month lifelong.  Continue thiamine supplementation.  Transfer to rehab.  Essential hypertension: On lisinopril and hydrochlorothiazide.  On hold due to AKI.  Will resume next 24 to 48 hours.  Type 2 diabetes, uncontrolled with hyperglycemia: On increasing dose of insulin.  Blood sugars are stable today.  Hyperlipidemia: On statin.  Anxiety disorder: On Xanax and Atarax that she can use.     DVT prophylaxis: SCDs Start: 08/31/20 0547   Code Status: Full code Family Communication:  None Disposition Plan: Status is: Inpatient  Remains inpatient appropriate because:Unsafe d/c plan  Dispo: The patient is from: Home              Anticipated d/c is to: CIR              Patient currently is medically stable to d/c.  Only to rehab level of care.   Difficult to place patient No         Consultants:  Neurology  Procedures:  None  Antimicrobials:  None   Subjective: Patient seen and examined.  No problem at rest.  Extremely anxious with fear of fall on mobility.  Wants to use some medications before restarting to work with rehab.  Objective: Vitals:   09/03/20 2321 09/04/20 0402 09/04/20 0717 09/04/20 1209  BP: (!) 114/50 (!) 110/48 (!) 103/47 (!) 110/55  Pulse: 68 (!) 58 67 66  Resp: 16 17 14 14   Temp: 98.3 F (36.8 C) 98.8 F (37.1 C) 99.3 F (37.4 C) 98.8 F (37.1 C)  TempSrc: Oral Oral Oral Oral  SpO2: 98% 99% 100% 100%  Weight:      Height:        Intake/Output Summary (Last 24 hours) at 09/04/2020 1418 Last data filed at 09/04/2020 0800 Gross per 24 hour  Intake 240 ml  Output 200 ml  Net 40 ml   Filed Weights   08/30/20 1555 08/31/20 2112  Weight: 76.2 kg 79.7 kg    Examination:  General exam: Appears calm and comfortable  Respiratory system: Clear to auscultation. Respiratory effort normal. Cardiovascular system: S1 & S2 heard, RRR. No JVD, murmurs, rubs, gallops or clicks. No pedal edema. Gastrointestinal  system: Abdomen is nondistended, soft and nontender. No organomegaly or masses felt. Normal bowel sounds heard. Central nervous system: Alert and oriented.  No gross neurological deficits. Gait not examined. On attempted mobility, she had feeling of falling.     Data Reviewed: I have personally reviewed following labs and imaging studies  CBC: Recent Labs  Lab 08/30/20 1644 09/01/20 0208 09/02/20 0200 09/03/20 0232 09/04/20 0334  WBC 3.7* 3.4* 5.2 5.9 5.0  NEUTROABS 2.4  --   --   --   --   HGB 9.7* 8.5* 9.2* 8.4*  7.9*  HCT 28.3* 25.0* 27.5* 25.8* 23.9*  MCV 105.6* 106.4* 107.0* 108.4* 110.1*  PLT 98* 99* 83* 77* 68*   Basic Metabolic Panel: Recent Labs  Lab 08/30/20 1644 08/31/20 0607 09/01/20 0208 09/02/20 0200 09/03/20 0136 09/04/20 0334  NA 140  --  133* 138 138 140  K 3.4*  --  4.5 4.2 4.9 5.2*  CL 104  --  102 104 109 113*  CO2 25  --  21* 26 22 23   GLUCOSE 135*  --  366* 166* 264* 313*  BUN 30*  --  27* 43* 50* 44*  CREATININE 1.14*  --  1.41* 2.02* 1.68* 1.34*  CALCIUM 9.6  --  8.7* 9.4 8.7* 8.6*  MG  --  2.0 1.9 2.3 2.2 2.2   GFR: Estimated Creatinine Clearance: 41.6 mL/min (A) (by C-G formula based on SCr of 1.34 mg/dL (H)). Liver Function Tests: Recent Labs  Lab 08/30/20 1644  AST 25  ALT 31  ALKPHOS 66  BILITOT 1.0  PROT 7.2  ALBUMIN 4.3   No results for input(s): LIPASE, AMYLASE in the last 168 hours. No results for input(s): AMMONIA in the last 168 hours. Coagulation Profile: No results for input(s): INR, PROTIME in the last 168 hours. Cardiac Enzymes: No results for input(s): CKTOTAL, CKMB, CKMBINDEX, TROPONINI in the last 168 hours. BNP (last 3 results) No results for input(s): PROBNP in the last 8760 hours. HbA1C: No results for input(s): HGBA1C in the last 72 hours. CBG: Recent Labs  Lab 09/03/20 1101 09/03/20 1634 09/03/20 2146 09/04/20 0610 09/04/20 1209  GLUCAP 195* 175* 215* 361* 233*   Lipid Profile: No results for input(s): CHOL, HDL, LDLCALC, TRIG, CHOLHDL, LDLDIRECT in the last 72 hours. Thyroid Function Tests: No results for input(s): TSH, T4TOTAL, FREET4, T3FREE, THYROIDAB in the last 72 hours. Anemia Panel: No results for input(s): VITAMINB12, FOLATE, FERRITIN, TIBC, IRON, RETICCTPCT in the last 72 hours. Sepsis Labs: No results for input(s): PROCALCITON, LATICACIDVEN in the last 168 hours.  Recent Results (from the past 240 hour(s))  Resp Panel by RT-PCR (Flu A&B, Covid) Nasopharyngeal Swab     Status: None   Collection Time:  08/30/20  6:10 PM   Specimen: Nasopharyngeal Swab; Nasopharyngeal(NP) swabs in vial transport medium  Result Value Ref Range Status   SARS Coronavirus 2 by RT PCR NEGATIVE NEGATIVE Final    Comment: (NOTE) SARS-CoV-2 target nucleic acids are NOT DETECTED.  The SARS-CoV-2 RNA is generally detectable in upper respiratory specimens during the acute phase of infection. The lowest concentration of SARS-CoV-2 viral copies this assay can detect is 138 copies/mL. A negative result does not preclude SARS-Cov-2 infection and should not be used as the sole basis for treatment or other patient management decisions. A negative result may occur with  improper specimen collection/handling, submission of specimen other than nasopharyngeal swab, presence of viral mutation(s) within the areas targeted by this assay, and inadequate number  of viral copies(<138 copies/mL). A negative result must be combined with clinical observations, patient history, and epidemiological information. The expected result is Negative.  Fact Sheet for Patients:  BloggerCourse.com  Fact Sheet for Healthcare Providers:  SeriousBroker.it  This test is no t yet approved or cleared by the [place] FDA and  has been authorized for detection and/or diagnosis of SARS-CoV-2 by FDA under an Emergency Use Authorization (EUA). This EUA will remain  in effect (meaning this test can be used) for the duration of the COVID-19 declaration under Section 564(b)(1) of the Act, 21 U.S.C.section 360bbb-3(b)(1), unless the authorization is terminated  or revoked sooner.       Influenza A by PCR NEGATIVE NEGATIVE Final   Influenza B by PCR NEGATIVE NEGATIVE Final    Comment: (NOTE) The Xpert Xpress SARS-CoV-2/FLU/RSV plus assay is intended as an aid in the diagnosis of influenza from Nasopharyngeal swab specimens and should not be used as a sole basis for treatment. Nasal washings  and aspirates are unacceptable for Xpert Xpress SARS-CoV-2/FLU/RSV testing.  Fact Sheet for Patients: BloggerCourse.com  Fact Sheet for Healthcare Providers: SeriousBroker.it  This test is not yet approved or cleared by the [place] FDA and has been authorized for detection and/or diagnosis of SARS-CoV-2 by FDA under an Emergency Use Authorization (EUA). This EUA will remain in effect (meaning this test can be used) for the duration of the COVID-19 declaration under Section 564(b)(1) of the Act, 21 U.S.C. section 360bbb-3(b)(1), unless the authorization is terminated or revoked.  Performed at [facility], [address], [place], [place] [address]          Radiology Studies: No results found.      Scheduled Meds:  aspirin EC  81 mg Oral Daily   cyanocobalamin  1,000 mcg Intramuscular Daily   Followed by   Melene Muller ON 09/07/2020] cyanocobalamin  1,000 mcg Intramuscular Weekly   insulin aspart  0-9 Units Subcutaneous TID WC   insulin aspart protamine- aspart  14 Units Subcutaneous BID WC   simvastatin  40 mg Oral QHS   thiamine injection  100 mg Intravenous Daily   Or   thiamine  100 mg Oral Daily   Continuous Infusions:  sodium chloride       LOS: 4 days    Time spent: 30 minutes    Dorcas Carrow, MD [facility] Pager [phone]

## 2020-09-04 NOTE — Progress Notes (Signed)
Occupational Therapy Treatment Patient Details Name: Diane Willis MRN: [id number] DOB: March 14, 1955 Today's Date: 09/04/2020    History of present illness 65 y/o female presented to ED on 8/18 with bilateral LE numbness and difficulty walking x 3 weeks. 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 Suspect due to vitamin B 12 deficiency. PMH: COVID, DM type 2, HTN   OT comments  t requested medication for anxiety at start of session, notified RN, but pt did not receive medication during session. Pt received in bed, agreeable to BUE/BLE therex and sitting upright in longsitting/semicircle in bed. Pt required setup assistance for grooming while in supported chair position in bed. Pt then completed BUE/BLE therex, demonstrating 5/5 elbow flexion/extension. Pt able to progress self into long sitting with modA-minA and BUE support on bed rails. During long sitting, pt did not appear to have increased anxiety. Pt will continue to benefit from skilled OT services to maximize safety and independence with ADL/IADL and functional mobility. Will continue to follow acutely and progress as tolerated.    Follow Up Recommendations  CIR    Equipment Recommendations  Wheelchair (measurements OT);Wheelchair cushion (measurements OT);Tub/shower bench    Recommendations for Other Services Rehab consult    Precautions / Restrictions Precautions Precautions: Fall Precaution Comments: very ataxic, sensation of falling, previous sessions screaming during session due to fear Restrictions Weight Bearing Restrictions: No       Mobility Bed Mobility Overal bed mobility: Needs Assistance Bed Mobility: Supine to Sit     Supine to sit: Mod assist;HOB elevated     General bed mobility comments: HOB elevated, pt able to use bilateral  rails to pull trunk into upright seated positiong and walk hands further for forward flexion of the trunk. Pt initially required modA to progress trunk to upright but was then able to progress with minA. Pt pulled into long sitting x3 and upright position with semicircle legs (1x Rknee bent, Lknee stright)(1x Lknee bent, Rknee stright)(1x full both in semicircle) noted pt to have increased knee extension with BLE in semicircle, required therapist to support BLE.    Transfers                 General transfer comment: Pt declined OOB mobility, declined progression to EOB;agreeable to long sitting n bed    Balance Overall balance assessment: Needs assistance Sitting-balance support: Bilateral upper extremity supported;Feet supported Sitting balance-Leahy Scale: Zero Sitting balance - Comments: Pt able to support trunk in upright position in long sitting with BUE support on bilateral bed rails. Postural control: Posterior lean                                 ADL either performed or assessed with clinical judgement   ADL Overall ADL's : Needs assistance/impaired     Grooming: Wash/dry hands;Wash/dry face;Set up;Bed level Grooming Details (indicate cue type and reason): required assistance with toothpaste cap;pt otherwise completed ADL after setup in supported chair position in bed Upper Body Bathing: Set up;Sitting           Lower Body Dressing: Maximal assistance;Bed level Lower Body Dressing Details (indicate cue type and reason): to don/doff socks               General ADL Comments: pt tolerated long sitting in bed  with BUE support on rails to address fear of falling and anxiety with movement     Vision       Perception     Praxis      Cognition Arousal/Alertness: Awake/alert Behavior During Therapy: WFL for tasks assessed/performed Overall Cognitive Status: Impaired/Different from baseline Area of Impairment: Following commands;Problem  solving;Awareness;Safety/judgement                       Following Commands: Follows one step commands inconsistently;Follows one step commands with increased time Safety/Judgement: Decreased awareness of deficits Awareness: Emergent Problem Solving: Slow processing;Difficulty sequencing;Requires verbal cues;Requires tactile cues General Comments: slow processing and following commands inconsistently. requires increased time to perform tasks. Decreased awareness into deficits        Exercises Exercises: General Upper Extremity;General Lower Extremity General Exercises - Upper Extremity Shoulder Flexion: AROM;Both;10 reps;Seated Shoulder Extension: AROM;Both;10 reps;Seated Elbow Flexion: Strengthening;Both;10 reps;Seated Elbow Extension: Strengthening;Both;10 reps;Seated Digit Composite Flexion: AROM;Both;10 reps;Seated Composite Extension: AROM;Both;10 reps;Seated General Exercises - Lower Extremity Ankle Circles/Pumps: AROM;Both;10 reps;Seated Quad Sets: AROM;Both;10 reps;Seated Heel Slides: AROM;Both;10 reps;Seated Hip ABduction/ADduction: AROM;Both;10 reps;Seated   Shoulder Instructions       General Comments vss    Pertinent Vitals/ Pain       Pain Assessment: No/denies pain  Home Living                                          Prior Functioning/Environment              Frequency  Min 2X/week        Progress Toward Goals  OT Goals(current goals can now be found in the care plan section)  Progress towards OT goals: Progressing toward goals  Acute Rehab OT Goals Patient Stated Goal: Get better and return home OT Goal Formulation: With patient Potential to Achieve Goals: Good ADL Goals Pt Will Perform Eating: with set-up;sitting Pt Will Perform Grooming: with set-up;sitting Pt Will Perform Upper Body Bathing: with set-up;sitting Pt Will Perform Upper Body Dressing: with set-up;sitting  Plan Discharge plan remains appropriate     Co-evaluation                 AM-PAC OT "6 Clicks" Daily Activity     Outcome Measure   Help from another person eating meals?: A Little Help from another person taking care of personal grooming?: A Little Help from another person toileting, which includes using toliet, bedpan, or urinal?: A Lot Help from another person bathing (including washing, rinsing, drying)?: A Lot Help from another person to put on and taking off regular upper body clothing?: A Lot Help from another person to put on and taking off regular lower body clothing?: A Lot 6 Click Score: 14    End of Session    OT Visit Diagnosis: Unsteadiness on feet (R26.81);Other abnormalities of gait and mobility (R26.89);Ataxia, unspecified (R27.0)   Activity Tolerance Patient tolerated treatment well   Patient Left in bed;with call bell/phone within reach;with bed alarm set   Nurse Communication Mobility status        Time: 3875-6433 OT Time Calculation (min): 38 min  Charges: OT General Charges $OT Visit: 1 Visit OT Treatments $Self Care/Home Management : 8-22 mins $Therapeutic Exercise: 23-37 mins  Rosey Bath OTR/L Acute Rehabilitation Services Office: [phone]    Rebeca Alert 09/04/2020, 4:21 PM

## 2020-09-05 DIAGNOSIS — G320 Subacute combined degeneration of spinal cord in diseases classified elsewhere: Secondary | ICD-10-CM | POA: Diagnosis not present

## 2020-09-05 DIAGNOSIS — E538 Deficiency of other specified B group vitamins: Secondary | ICD-10-CM | POA: Diagnosis not present

## 2020-09-05 LAB — BASIC METABOLIC PANEL
Anion gap: 7 (ref 5–15)
BUN: 45 mg/dL — ABNORMAL HIGH (ref 8–23)
CO2: 21 mmol/L — ABNORMAL LOW (ref 22–32)
Calcium: 8.6 mg/dL — ABNORMAL LOW (ref 8.9–10.3)
Chloride: 111 mmol/L (ref 98–111)
Creatinine, Ser: 1.25 mg/dL — ABNORMAL HIGH (ref 0.44–1.00)
GFR, Estimated: 49 mL/min — ABNORMAL LOW (ref >60)
Glucose, Bld: 301 mg/dL — ABNORMAL HIGH (ref 70–99)
Potassium: 4.8 mmol/L (ref 3.5–5.1)
Sodium: 139 mmol/L (ref 135–145)

## 2020-09-05 LAB — VITAMIN B1: Vitamin B1 (Thiamine): 53.1 nmol/L — ABNORMAL LOW (ref 66.5–200.0)

## 2020-09-05 LAB — GLUCOSE, CAPILLARY
Glucose-Capillary: 362 mg/dL — ABNORMAL HIGH (ref 70–99)
Glucose-Capillary: 198 mg/dL — ABNORMAL HIGH (ref 70–99)
Glucose-Capillary: 266 mg/dL — ABNORMAL HIGH (ref 70–99)

## 2020-09-05 LAB — METHYLMALONIC ACID, SERUM: Methylmalonic Acid, Quantitative: 72515 nmol/L — ABNORMAL HIGH (ref 0–378)

## 2020-09-05 LAB — MAGNESIUM: Magnesium: 2.2 mg/dL (ref 1.7–2.4)

## 2020-09-05 MED ORDER — INSULIN ASPART PROT & ASPART (70-30 MIX) 100 UNIT/ML ~~LOC~~ SUSP
20.0000 [IU] | Freq: Two times a day (BID) | SUBCUTANEOUS | Status: DC
  Administered 2020-09-05: 20 [IU] via SUBCUTANEOUS

## 2020-09-05 NOTE — Progress Notes (Signed)
Physical Therapy Treatment Patient Details Name: Diane Willis MRN: [id number] DOB: 04-Mar-1955 Today's Date: 09/05/2020    History of Present Illness 65 y/o female presented to ED on 8/18 with bilateral LE numbness and difficulty walking x 3 weeks. 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 Suspect due to vitamin B 12 deficiency. PMH: COVID, DM type 2, HTN    PT Comments    Pt progressing towards physical therapy goals. Premedicated for anxiety prior to session and overall tolerated treatment well. She required hands-on support at times for pt to feel secure while sitting EOB, but progressed to being able to sit unsupported for short bouts by end of session. Stood x4 throughout session with use of Stedy, and on last stand achieved almost full upright posture. Pt is currently appropriate for OOB to chair with the [facility] with nursing staff between sessions, and therapy continuing to work on progressing mobility. Pt motivated and was willing to continue working after our time was over - feel overall she will be able to tolerate the increased intensity of a CIR admission. Will continue to follow.    Follow Up Recommendations  CIR     Equipment Recommendations  Other (comment) (TBD)    Recommendations for Other Services Rehab consult     Precautions / Restrictions Precautions Precautions: Fall Precaution Comments: ataxic, sensation of falling with movement, anxious Restrictions Weight Bearing Restrictions: No    Mobility  Bed Mobility Overal bed mobility: Needs Assistance Bed Mobility: Supine to Sit     Supine to sit: Mod assist;HOB elevated Sit to supine: Total assist;+2 for physical assistance   General bed mobility comments: HOB elevated, pt able to use bilateral rails to pull trunk into upright  seated position and bring LE's over EOB to sit. Bed pad utilized for scooting out fully. +2 for return to supine at end of session due to anxiety.    Transfers Overall transfer level: Needs assistance Equipment used: 2 person hand held assist Transfers: Sit to/from Stand Sit to Stand: +2 physical assistance;Mod assist;Max assist         General transfer comment: Attempted with Stedy. Pt required block of feet to remain on platform, otherwise sliding forward in preparation to stand. Stood x4 with last stand being the best. Pt with difficulty fully extending hips and knees enough to get Stedy flaps under hips. On last stand, peri-care performed, and pt was able to achieve more upright posture.  Ambulation/Gait             General Gait Details: Unable to attempt   Stairs             Wheelchair Mobility    Modified Rankin (Stroke Patients Only)       Balance Overall balance assessment: Needs assistance Sitting-balance support: Bilateral upper extremity supported;Feet supported Sitting balance-Leahy Scale: Poor Sitting balance - Comments: At EOB - mainly limited by anxiety Postural control: Posterior lean Standing balance support: Bilateral upper extremity supported Standing balance-Leahy Scale: Zero Standing balance comment: +2 required                            Cognition Arousal/Alertness: Awake/alert Behavior During Therapy: Anxious Overall Cognitive Status: Impaired/Different from baseline Area of Impairment: Following commands;Problem solving;Awareness;Safety/judgement  Following Commands: Follows one step commands inconsistently;Follows one step commands with increased time Safety/Judgement: Decreased awareness of deficits Awareness: Emergent Problem Solving: Slow processing;Difficulty sequencing;Requires verbal cues;Requires tactile cues General Comments: slow processing and following commands inconsistently.  requires increased time to perform tasks. Decreased awareness into deficits. Very anxious, requiring physical touch from therapist to feel secure.      Exercises      General Comments        Pertinent Vitals/Pain Pain Assessment: No/denies pain    Home Living                      Prior Function            PT Goals (current goals can now be found in the care plan section) Acute Rehab PT Goals Patient Stated Goal: Get better and return home PT Goal Formulation: With patient Time For Goal Achievement: 09/15/20 Potential to Achieve Goals: Fair Progress towards PT goals: Progressing toward goals    Frequency    Min 3X/week      PT Plan Current plan remains appropriate    Co-evaluation              AM-PAC PT "6 Clicks" Mobility   Outcome Measure  Help needed turning from your back to your side while in a flat bed without using bedrails?: A Lot Help needed moving from lying on your back to sitting on the side of a flat bed without using bedrails?: A Lot Help needed moving to and from a bed to a chair (including a wheelchair)?: Total Help needed standing up from a chair using your arms (e.g., wheelchair or bedside chair)?: Total Help needed to walk in hospital room?: Total Help needed climbing 3-5 steps with a railing? : Total 6 Click Score: 8    End of Session Equipment Utilized During Treatment: Gait belt Activity Tolerance: Patient tolerated treatment well Patient left: in bed;with call bell/phone within reach;with bed alarm set Nurse Communication: Mobility status PT Visit Diagnosis: Unsteadiness on feet (R26.81);Muscle weakness (generalized) (M62.81);Ataxic gait (R26.0);Other abnormalities of gait and mobility (R26.89);Other symptoms and signs involving the nervous system (B71.696)     Time: 7893-8101 PT Time Calculation (min) (ACUTE ONLY): 29 min  Charges:  $Therapeutic Activity: 23-37 mins                     Conni Slipper, PT, DPT Acute  Rehabilitation Services Pager: [phone] Office: [phone]    [name] 09/05/2020, 2:42 PM

## 2020-09-05 NOTE — Progress Notes (Signed)
PROGRESS NOTE    Diane Willis  YTK:[id number] DOB: 09/21/1955 DOA: 08/30/2020 PCP: Fleet Contras, MD    Brief Narrative:  64-year with history of DM2, HTN, HLD admitted for progressive bilateral lower extremity weakness, numbness and tingling for the past 3 weeks.  Patient underwent extensive work-up including CT head and MRI spine which showed some demyelination of her spine consistent with finding of vitamin B12 deficiency.  Started on B12 supplements.  Neurology team was following.  Also developed acute kidney injury that is improving. Patient does not let us examine her feet even after multiple counseling.  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  Multifactorial acute kidney injury. Creatinine 1.5-1.14-1.41-2.02-1.68-1.34-1.25. Trending down. Renal ultrasound was essentially normal.  Antihypertensives and lisinopril on hold. Keeping off IV fluids. Maintaining on oral intake.   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 Folate normal. Homocysteine over 250. TSH normal. RPR -, HIV NR, copper 143 , Zinc level is 82 Treating with vitamin B12 supplementation. Vitamin B12 1000 mcg daily for 7 days Vitamin B12 1000 mcg weekly for 7 weeks then vitamin B12 1000 mcg every month lifelong.  Continue thiamine supplementation.  Transfer to rehab.  Essential hypertension: On lisinopril and hydrochlorothiazide.  On hold due to AKI.  Blood pressures are stable, low normal.  No indication for antihypertensive at this time.  Type 2 diabetes, uncontrolled with hyperglycemia: On increasing dose of insulin.  Blood sugars are stable today.  Increased insulin to 20  units twice daily today.  Hyperlipidemia: On statin.  Anxiety disorder: On Xanax and Atarax that she can use.     DVT prophylaxis: SCDs Start: 08/31/20 0547   Code Status: Full code Family Communication: Husband on the phone. Disposition Plan: Status is: Inpatient  Remains inpatient appropriate because:Unsafe d/c plan  Dispo: The patient is from: Home              Anticipated d/c is to: [facility]              Patient currently is medically stable to d/c.  Only to rehab level of care.   Difficult to place patient No         Consultants:  Neurology  Procedures:  None  Antimicrobials:  None   Subjective: Patient seen and examined.  No overnight events.  She thinks she will be more comfortable today working with therapist. I try to remove her socks to examine her feet, patient was so adamant that nobody looks at her feet.  Multiple counseling done.  Nursing staff at the bedside but patient will not let us examine her feet.  She is wearing her socks from home then she is wearing hospital socks on top of it. I called and talked to her husband about it, if patient does not let me examine her feet, we may be missing diagnosis like ischemic leg or any lesions.  Objective: Vitals:   09/05/20 0013 09/05/20 0404 09/05/20 0920 09/05/20 1250  BP: (!) 124/58 (!) 120/56 (!) 112/59 (!) 97/47  Pulse: 63 (!) 53 (!) 58 62  Resp: 19 19 16 19   Temp: 99 F (37.2 C) 98.4 F (36.9 C) 98.2 F (36.8 C) 98.2  F (36.8 C)  TempSrc: Oral Oral Oral Oral  SpO2: 100% 98% 100% 100%  Weight:      Height:        Intake/Output Summary (Last 24 hours) at 09/05/2020 1347 Last data filed at 09/05/2020 1310 Gross per 24 hour  Intake 480 ml  Output 1000 ml  Net -520 ml   Filed Weights   08/30/20 1555 08/31/20 2112  Weight: 76.2 kg 79.7 kg    Examination: General exam: Appears calm and comfortable  Respiratory system: Clear to auscultation. Respiratory effort normal. Cardiovascular system: S1  & S2 heard, RRR. No JVD, murmurs, rubs, gallops or clicks. No pedal edema. Gastrointestinal system: Abdomen is nondistended, soft and nontender. No organomegaly or masses felt. Normal bowel sounds heard. Central nervous system: Alert and oriented.  No gross neurological deficits. Gait not examined. On attempted mobility, she had feeling of falling. Adamantly refused feet and ankle exam despite multiple attempts and knowing that we may miss any major problems.     Data Reviewed: I have personally reviewed following labs and imaging studies  CBC: Recent Labs  Lab 08/30/20 1644 09/01/20 0208 09/02/20 0200 09/03/20 0232 09/04/20 0334  WBC 3.7* 3.4* 5.2 5.9 5.0  NEUTROABS 2.4  --   --   --   --   HGB 9.7* 8.5* 9.2* 8.4* 7.9*  HCT 28.3* 25.0* 27.5* 25.8* 23.9*  MCV 105.6* 106.4* 107.0* 108.4* 110.1*  PLT 98* 99* 83* 77* 68*   Basic Metabolic Panel: Recent Labs  Lab 09/01/20 0208 09/02/20 0200 09/03/20 0136 09/04/20 0334 09/05/20 0326  NA 133* 138 138 140 139  K 4.5 4.2 4.9 5.2* 4.8  CL 102 104 109 113* 111  CO2 21* 26 22 23  21*  GLUCOSE 366* 166* 264* 313* 301*  BUN 27* 43* 50* 44* 45*  CREATININE 1.41* 2.02* 1.68* 1.34* 1.25*  CALCIUM 8.7* 9.4 8.7* 8.6* 8.6*  MG 1.9 2.3 2.2 2.2 2.2   GFR: Estimated Creatinine Clearance: 44.6 mL/min (A) (by C-G formula based on SCr of 1.25 mg/dL (H)). Liver Function Tests: Recent Labs  Lab 08/30/20 1644  AST 25  ALT 31  ALKPHOS 66  BILITOT 1.0  PROT 7.2  ALBUMIN 4.3   No results for input(s): LIPASE, AMYLASE in the last 168 hours. No results for input(s): AMMONIA in the last 168 hours. Coagulation Profile: No results for input(s): INR, PROTIME in the last 168 hours. Cardiac Enzymes: No results for input(s): CKTOTAL, CKMB, CKMBINDEX, TROPONINI in the last 168 hours. BNP (last 3 results) No results for input(s): PROBNP in the last 8760 hours. HbA1C: No results for input(s): HGBA1C in the last 72 hours. CBG: Recent Labs   Lab 09/04/20 1209 09/04/20 1650 09/04/20 2102 09/05/20 0607 09/05/20 1108  GLUCAP 233* 200* 198* 362* 198*   Lipid Profile: No results for input(s): CHOL, HDL, LDLCALC, TRIG, CHOLHDL, LDLDIRECT in the last 72 hours. Thyroid Function Tests: No results for input(s): TSH, T4TOTAL, FREET4, T3FREE, THYROIDAB in the last 72 hours. Anemia Panel: No results for input(s): VITAMINB12, FOLATE, FERRITIN, TIBC, IRON, RETICCTPCT in the last 72 hours. Sepsis Labs: No results for input(s): PROCALCITON, LATICACIDVEN in the last 168 hours.  Recent Results (from the past 240 hour(s))  Resp Panel by RT-PCR (Flu A&B, Covid) Nasopharyngeal Swab     Status: None   Collection Time: 08/30/20  6:10 PM   Specimen: Nasopharyngeal Swab; Nasopharyngeal(NP) swabs in vial transport medium  Result Value Ref Range Status   SARS Coronavirus 2 by RT  PCR NEGATIVE NEGATIVE Final    Comment: (NOTE) SARS-CoV-2 target nucleic acids are NOT DETECTED.  The SARS-CoV-2 RNA is generally detectable in upper respiratory specimens during the acute phase of infection. The lowest concentration of SARS-CoV-2 viral copies this assay can detect is 138 copies/mL. A negative result does not preclude SARS-Cov-2 infection and should not be used as the sole basis for treatment or other patient management decisions. A negative result may occur with  improper specimen collection/handling, submission of specimen other than nasopharyngeal swab, presence of viral mutation(s) within the areas targeted by this assay, and inadequate number of viral copies(<138 copies/mL). A negative result must be combined with clinical observations, patient history, and epidemiological information. The expected result is Negative.  Fact Sheet for Patients:  BloggerCourse.com  Fact Sheet for Healthcare Providers:  SeriousBroker.it  This test is no t yet approved or cleared by the [place] FDA and   has been authorized for detection and/or diagnosis of SARS-CoV-2 by FDA under an Emergency Use Authorization (EUA). This EUA will remain  in effect (meaning this test can be used) for the duration of the COVID-19 declaration under Section 564(b)(1) of the Act, 21 U.S.C.section 360bbb-3(b)(1), unless the authorization is terminated  or revoked sooner.       Influenza A by PCR NEGATIVE NEGATIVE Final   Influenza B by PCR NEGATIVE NEGATIVE Final    Comment: (NOTE) The Xpert Xpress SARS-CoV-2/FLU/RSV plus assay is intended as an aid in the diagnosis of influenza from Nasopharyngeal swab specimens and should not be used as a sole basis for treatment. Nasal washings and aspirates are unacceptable for Xpert Xpress SARS-CoV-2/FLU/RSV testing.  Fact Sheet for Patients: BloggerCourse.com  Fact Sheet for Healthcare Providers: SeriousBroker.it  This test is not yet approved or cleared by the [place] FDA and has been authorized for detection and/or diagnosis of SARS-CoV-2 by FDA under an Emergency Use Authorization (EUA). This EUA will remain in effect (meaning this test can be used) for the duration of the COVID-19 declaration under Section 564(b)(1) of the Act, 21 U.S.C. section 360bbb-3(b)(1), unless the authorization is terminated or revoked.  Performed at [facility], [address], [place], [place] [address]          Radiology Studies: No results found.      Scheduled Meds:  aspirin EC  81 mg Oral Daily   cyanocobalamin  1,000 mcg Intramuscular Daily   Followed by   Melene Muller ON 09/07/2020] cyanocobalamin  1,000 mcg Intramuscular Weekly   insulin aspart  0-9 Units Subcutaneous TID WC   insulin aspart protamine- aspart  20 Units Subcutaneous BID WC   simvastatin  40 mg Oral QHS   thiamine injection  100 mg Intravenous Daily   Or   thiamine  100 mg Oral Daily   Continuous Infusions:     LOS: 5  days    Time spent: 30 minutes    Dorcas Carrow, MD [facility] Pager [phone]

## 2020-09-05 NOTE — Progress Notes (Signed)
Patient refuses to let nurses assess her feet. Informed provider, and pt will not allow provider to assess her feet as well.

## 2020-09-05 NOTE — Plan of Care (Signed)

## 2020-09-05 NOTE — Progress Notes (Addendum)
Inpatient Rehabilitation Admissions Coordinator   Once I have updated PT notes from today, I will then begin insurance Auth with [employer] for a possible [facility] admit pending insurance approval. Therapy has been notified. I met with patient at bedside and she is aware and in agreement.  Danne Baxter, RN, MSN Rehab Admissions Coordinator [phone] 09/05/2020 2:31 PM

## 2020-09-05 NOTE — Progress Notes (Signed)
Inpatient Diabetes Program Recommendations  AACE/ADA: New Consensus Statement on Inpatient Glycemic Control (2015)  Target Ranges:  Prepandial:   less than 140 mg/dL      Peak postprandial:   less than 180 mg/dL (1-2 hours)      Critically ill patients:  140 - 180 mg/dL   Lab Results  Component Value Date   GLUCAP 362 (H) 09/05/2020   HGBA1C 7.8 (H) 08/31/2020    Review of Glycemic Control Results for Diane Willis, Diane Willis (MRN [id number]) as of 09/05/2020 10:20  Ref. Range 09/04/2020 06:10 09/04/2020 12:09 09/04/2020 16:50 09/04/2020 21:02 09/05/2020 06:07  Glucose-Capillary Latest Ref Range: 70 - 99 mg/dL 614 (H) 431 (H) 540 (H) 198 (H) 362 (H)   Diabetes history: Type 2 DM Outpatient Diabetes medications: Novolog 70/30 30 units QAM, 25 units QP, Metformin 500 mg BID, Januvia 100 mg QD Current orders for Inpatient glycemic control: Novolog 70/30 14 units BID, Novolog 0-9 units TID   Inpatient Diabetes Program Recommendations:     Assuming patient is eating well, consider increasing Novolog 70/30 to 20 units BID and adding Novolog 0-5 units QHS.   Secure chat sent.   Thanks, Lujean Rave, MSN, RNC-OB Diabetes Coordinator [phone] (8a-5p)

## 2020-09-05 NOTE — PMR Pre-admission (Signed)
PMR Admission Coordinator Pre-Admission Assessment  Patient: Diane Willis is an 65 y.o., female MRN: [id number] DOB: 03-28-1955 Height: 5' 1" (154.9 cm) Weight: 79.7 kg Insurance Information HMO:     PPO: yes     PCP:      IPA:      80/20:      OTHER:  PRIMARY: [place] [employer] [place]      Policy#: [id number]      Subscriber: pt CM Name: Elige Ko.      Phone#: [phone]     Fax#: [phone] Pre-Cert#: [id number] updates to same on 09/19/20    Employer: retired [employer] Benefits:  Phone #: [phone]     Name: 8/24 Eff. Date: 01/14/2020     Deduct: $1250      Out of Pocket Max: $4890      Life Max: none CIR: 80%      SNF: 80% 100 days Outpatient: $10 to $52 co pay per visit     Co-Pay: visits per medical neccesity Home Health: 80%      Co-Pay: visits per medical neccesity DME: 80%     Co-Pay: 20% Providers: in network  SECONDARY: none  Financial Counselor:       Phone#:   The Engineer, petroleum" for patients in Inpatient Rehabilitation Facilities with attached "Privacy Act [place] Records" was provided and verbally reviewed with: N/A  Emergency Contact Information Contact Information     Name Relation Home Work Mobile   Kimball Spouse [phone]        Current Medical History  Patient Admitting Diagnosis: Subacute combined degeneration of spinal cord  History of Present Illness: 65 year old female with history of type 2 DM on insulin, HTN, HLD who presented on 08/30/2020 with complaints of Lower extremity weakness that began about 3 weeks ago. Started with tingling and numbness amd PCP placed patient on gabapentin. Symptoms progressive.   Workup including CT head and MRI spine which showed some demyelination of her spine consistent with finding of vitamin B12 deficiency. Started on B12 supplements and thiamine supplementation. . Neurology consulted.    Also developed Acute kidney injury that is improving. AKI on CKD stage II  with baseline creat 1.1. Felt multifactorial. Renal ultrasound normal. Antihypertensives on hold and lisinopril on hold. Maintain oral intake and Off IV fluid for now.   Type 2 DM, uncontrolled with hyperglycemia. Increased 70/30 insulin and keep on sliding scale insulin.   Anxiety disorder on Xanax and Atarax. Does have fear of falling.  Patient's medical record from [facility]  has been reviewed by the rehabilitation admission coordinator and physician.  Past Medical History  Past Medical History:  Diagnosis Date   COVID-19 04/2012   Diabetes mellitus without complication ([place])    Hypertension     Has the patient had major surgery during 100 days prior to admission? No  Family History   family history includes COPD in her father; HIV in her brother, brother, brother, brother, brother, and sister; Healthy in her mother, sister, and sister.  Current Medications  Current Facility-Administered Medications:    acetaminophen (TYLENOL) tablet 650 mg, 650 mg, Oral, Q6H PRN, 650 mg at 09/01/20 0113 **OR** acetaminophen (TYLENOL) suppository 650 mg, 650 mg, Rectal, Q6H PRN, Rise Patience, MD   ALPRAZolam Duanne Moron) tablet 0.25 mg, 0.25 mg, Oral, TID PRN, Rise Patience, MD, 0.25 mg at 09/05/20 1053   aspirin EC tablet 81 mg, 81 mg, Oral, Daily, Kakrakandy, Arshad N,  MD, 81 mg at 09/06/20 1116   [COMPLETED] cyanocobalamin ((VITAMIN B-12)) injection 1,000 mcg, 1,000 mcg, Intramuscular, Daily, 1,000 mcg at 09/06/20 1134 **FOLLOWED BY** [START ON 09/07/2020] cyanocobalamin ((VITAMIN B-12)) injection 1,000 mcg, 1,000 mcg, Intramuscular, Weekly, Donnetta Simpers, MD   docusate sodium (COLACE) capsule 100 mg, 100 mg, Oral, BID PRN, Donnetta Simpers, MD, 100 mg at 08/31/20 6283   hydrALAZINE (APRESOLINE) injection 10 mg, 10 mg, Intravenous, Q4H PRN, Rise Patience, MD   hydrOXYzine (ATARAX/VISTARIL) tablet 25 mg, 25 mg, Oral, Q6H PRN, Rise Patience, MD   insulin  aspart (novoLOG) injection 0-5 Units, 0-5 Units, Subcutaneous, QHS, Ghimire, Kuber, MD   insulin aspart (novoLOG) injection 0-9 Units, 0-9 Units, Subcutaneous, TID WC, Ghimire, Kuber, MD   insulin aspart protamine- aspart (NOVOLOG MIX 70/30) injection 25 Units, 25 Units, Subcutaneous, BID WC, Ghimire, Kuber, MD, 25 Units at 09/06/20 1118   ondansetron (ZOFRAN) injection 4 mg, 4 mg, Intravenous, Q6H PRN, Reubin Milan, MD, 4 mg at 09/03/20 2116   simvastatin (ZOCOR) tablet 40 mg, 40 mg, Oral, QHS, Rise Patience, MD, 40 mg at 09/05/20 2115   thiamine (B-1) injection 100 mg, 100 mg, Intravenous, Daily, 100 mg at 08/31/20 0847 **OR** thiamine tablet 100 mg, 100 mg, Oral, Daily, Donnetta Simpers, MD, 100 mg at 09/06/20 1116  Patients Current Diet:  Diet Order             Diet - low sodium heart healthy           Diet Carb Modified           Diet heart healthy/carb modified Room service appropriate? Yes; Fluid consistency: Thin  Diet effective now                  Precautions / Restrictions Precautions Precautions: Fall Precaution Comments: ataxic, sensation of falling with movement, anxious Restrictions Weight Bearing Restrictions: No   Has the patient had 2 or more falls or a fall with injury in the past year? Yes  Prior Activity Level Limited Community (1-2x/wk): Mod I with RW as needed, but increased use over past 3 weeks pta  Prior Functional Level Self Care: Did the patient need help bathing, dressing, using the toilet or eating? Needed some help  Indoor Mobility: Did the patient need assistance with walking from room to room (with or without device)? Needed some help  Stairs: Did the patient need assistance with internal or external stairs (with or without device)? Needed some help  Functional Cognition: Did the patient need help planning regular tasks such as shopping or remembering to take medications? Independent  Patient Information Are you of  Hispanic, Latino/a,or Spanish origin?: A. No, not of Hispanic, Latino/a, or Spanish origin What is your race?: B. Black or African American Do you need or want an interpreter to communicate with a doctor or health care staff?: 0. No  Patient's Response To:  Health Literacy and Transportation Is the patient able to respond to health literacy and transportation needs?: Yes Health Literacy - How often do you need to have someone help you when you read instructions, pamphlets, or other written material from your doctor or pharmacy?: Never In the past 12 months, has lack of transportation kept you from medical appointments or from getting medications?: No In the past 12 months, has lack of transportation kept you from meetings, work, or from getting things needed for daily living?: No  Home Assistive Devices / Equipment Home Equipment: Environmental consultant - 4 wheels  Prior Device Use: Indicate devices/aids used by the patient prior to current illness, exacerbation or injury? Walker  Current Functional Level Cognition  Overall Cognitive Status: Impaired/Different from baseline Orientation Level: Oriented X4 Following Commands: Follows one step commands inconsistently, Follows one step commands with increased time Safety/Judgement: Decreased awareness of deficits General Comments: slow processing and following commands inconsistently. requires increased time to perform tasks. Decreased awareness into deficits. Very anxious, requiring physical touch from therapist to feel secure.    Extremity Assessment (includes Sensation/Coordination)  Upper Extremity Assessment: RUE deficits/detail, LUE deficits/detail RUE Deficits / Details: Ataxia; digits in extension, mass grasp, pt is able to grasp bed rails, grossly elbow flexion/extension 5/5MMT;shoulder flexion AROm ~170, pt reports pain with further range; decreased fine motor skills, RUE Coordination: decreased fine motor, decreased gross motor LUE Deficits /  Details: Ataxia;LUE noted 3rd digit with increased extension, gross grasp; LUE elbow flexion/extension 5/5;full AROM shoulder flexion LUE Coordination: decreased fine motor, decreased gross motor  Lower Extremity Assessment: Defer to PT evaluation RLE Deficits / Details: 5/5 strength; very ataxic RLE Sensation: decreased light touch RLE Coordination: decreased fine motor, decreased gross motor LLE Deficits / Details: 5/5 strength; very ataxic LLE Sensation: decreased light touch LLE Coordination: decreased fine motor, decreased gross motor    ADLs  Overall ADL's : Needs assistance/impaired Grooming: Wash/dry hands, Wash/dry face, Set up, Bed level Grooming Details (indicate cue type and reason): required assistance with toothpaste cap;pt otherwise completed ADL after setup in supported chair position in bed Upper Body Bathing: Set up, Sitting Upper Body Dressing : Moderate assistance, Bed level Lower Body Dressing: Maximal assistance, Bed level Lower Body Dressing Details (indicate cue type and reason): to don/doff socks Functional mobility during ADLs: Total assistance General ADL Comments: pt tolerated long sitting in bed with BUE support on rails to address fear of falling and anxiety with movement    Mobility  Overal bed mobility: Needs Assistance Bed Mobility: Supine to Sit Supine to sit: Mod assist, HOB elevated Sit to supine: Total assist, +2 for physical assistance General bed mobility comments: HOB elevated, pt able to use bilateral rails to pull trunk into upright seated position and bring LE's over EOB to sit. Bed pad utilized for scooting out fully. +2 for return to supine at end of session due to anxiety.    Transfers  Overall transfer level: Needs assistance Equipment used: 2 person hand held assist Transfer via Lift Equipment: Stedy Transfers: Sit to/from Stand Sit to Stand: +2 physical assistance, Mod assist, Max assist Stand pivot transfers: Total assist, +2  physical assistance, +2 safety/equipment Squat pivot transfers: Total assist, +2 safety/equipment, +2 physical assistance  Lateral/Scoot Transfers: Total assist, +2 physical assistance, +2 safety/equipment General transfer comment: Attempted with Stedy. Pt required block of feet to remain on platform, otherwise sliding forward in preparation to stand. Stood x4 with last stand being the best. Pt with difficulty fully extending hips and knees enough to get Stedy flaps under hips. On last stand, peri-care performed, and pt was able to achieve more upright posture.    Ambulation / Gait / Stairs / Wheelchair Mobility  Ambulation/Gait General Gait Details: Unable to attempt    Posture / Balance Dynamic Sitting Balance Sitting balance - Comments: At EOB - mainly limited by anxiety Balance Overall balance assessment: Needs assistance Sitting-balance support: Bilateral upper extremity supported, Feet supported Sitting balance-Leahy Scale: Poor Sitting balance - Comments: At EOB - mainly limited by anxiety Postural control: Posterior lean Standing balance support: Bilateral upper extremity  supported Standing balance-Leahy Scale: Zero Standing balance comment: +2 required    Special needs/care consideration Hgb A1c 7.8   Previous Home Environment  Living Arrangements: Spouse/significant other Available Help at Discharge: Family, Available 24 hours/day Type of Home: House Home Layout: One level Home Access: Stairs to enter Entrance Stairs-Rails: None Entrance Stairs-Number of Steps: 4 Bathroom Shower/Tub: Multimedia programmer: Standard Bathroom Accessibility: Yes How Accessible: Accessible via walker  Discharge Living Setting Plans for Discharge Living Setting: Patient's home, Lives with (comment) (spouse) Type of Home at Discharge: House Discharge Home Layout: One level Discharge Home Access: Stairs to enter Entrance Stairs-Rails: None Entrance Stairs-Number of Steps:  4 Discharge Bathroom Shower/Tub: Horticulturist, commercial: Standard Discharge Bathroom Accessibility: Yes How Accessible: Accessible via walker Does the patient have any problems obtaining your medications?: No  Social/Family/Support Systems Patient Roles: Spouse Contact Information: spouse, [name] Anticipated Caregiver: spouse Anticipated Ambulance person Information: see above Ability/Limitations of Caregiver: spouse works 10 pm night shift driving truck for [employer], does have leave time Caregiver Availability: 24/7 (pt's mother in Sports coach and sisters can assistt when spouse works) Discharge Plan Discussed with Primary Caregiver: Yes Is Caregiver In Agreement with Plan?: Yes Does Caregiver/Family have Issues with Lodging/Transportation while Pt is in Rehab?: No  Goals Patient/Family Goal for Rehab: supervision to min asisst with PT and OT/ SPouse aware that this may be at wheelchair level Expected length of stay: ELOS 2 to 3 weeks Pt/Family Agrees to Admission and willing to participate: Yes Program Orientation Provided & Reviewed with Pt/Caregiver Including Roles  & Responsibilities: Yes  Decrease burden of Care through IP rehab admission: n/a  Possible need for SNF placement upon discharge: not anticipated  Patient Condition: I have reviewed medical records from [facility] , spoken with CM, and patient. I met with patient at the bedside for inpatient rehabilitation assessment.  Patient will benefit from ongoing PT and OT, can actively participate in 3 hours of therapy a day 5 days of the week, and can make measurable gains during the admission.  Patient will also benefit from the coordinated team approach during an Inpatient Acute Rehabilitation admission.  The patient will receive intensive therapy as well as Rehabilitation physician, nursing, social worker, and care management interventions.  Due to bladder management, bowel management, safety, skin/wound  care, disease management, medication administration, pain management, and patient education the patient requires 24 hour a day rehabilitation nursing.  The patient is currently mod to max assist with Stedy  with mobility and basic ADLs.  Discharge setting and therapy post discharge at home with home health is anticipated.  Patient has agreed to participate in the Acute Inpatient Rehabilitation Program and will admit today.  Preadmission Screen Completed By:  Cleatrice Burke, 09/06/2020 12:35 PM ______________________________________________________________________   Discussed status with Dr. Dagoberto Ligas on 09/06/2020 at 1235 and received approval for admission today.  Admission Coordinator:  Cleatrice Burke, RN, time 2683 Date 09/06/2020   Assessment/Plan: Diagnosis: Does the need for close, 24 hr/day Medical supervision in concert with the patient's rehab needs make it unreasonable for this patient to be served in a less intensive setting? Yes Co-Morbidities requiring supervision/potential complications: demyelination/degeneration of Spinal cord due to Vit B12 deficiency- CKD II; AKI; anxiety- severe; DM on insulin; HTN; HLD Due to bladder management, bowel management, safety, skin/wound care, disease management, medication administration, pain management, and patient education, does the patient require 24 hr/day rehab nursing? Yes Does the patient require coordinated care of a physician,  rehab nurse, PT, OT, and SLP to address physical and functional deficits in the context of the above medical diagnosis(es)? Yes Addressing deficits in the following areas: balance, endurance, locomotion, strength, transferring, bowel/bladder control, bathing, dressing, feeding, grooming, toileting, cognition, and psychosocial support Can the patient actively participate in an intensive therapy program of at least 3 hrs of therapy 5 days a week? Yes The potential for patient to make measurable gains while on  inpatient rehab is good and fair Anticipated functional outcomes upon discharge from inpatient rehab: min assist PT, min assist OT, modified independent SLP Estimated rehab length of stay to reach the above functional goals is: 2-3 weeks Anticipated discharge destination: Home 10. Overall Rehab/Functional Prognosis: good and fair   MD Signature:

## 2020-09-06 ENCOUNTER — Encounter (HOSPITAL_COMMUNITY): Payer: Self-pay | Admitting: Physical Medicine and Rehabilitation

## 2020-09-06 ENCOUNTER — Other Ambulatory Visit: Payer: Self-pay

## 2020-09-06 ENCOUNTER — Encounter (HOSPITAL_COMMUNITY): Payer: Self-pay | Admitting: Internal Medicine

## 2020-09-06 ENCOUNTER — Inpatient Hospital Stay (HOSPITAL_COMMUNITY)
Admission: RE | Admit: 2020-09-06 | Discharge: 2020-10-03 | DRG: 640 | Disposition: A | Discharge status: Home / Self Care | Payer: BC Managed Care – PPO | Source: Intra-hospital | Attending: Physical Medicine and Rehabilitation | Admitting: Physical Medicine and Rehabilitation

## 2020-09-06 DIAGNOSIS — R7989 Other specified abnormal findings of blood chemistry: Secondary | ICD-10-CM | POA: Diagnosis not present

## 2020-09-06 DIAGNOSIS — G320 Subacute combined degeneration of spinal cord in diseases classified elsewhere: Secondary | ICD-10-CM | POA: Diagnosis present

## 2020-09-06 DIAGNOSIS — G950 Syringomyelia and syringobulbia: Secondary | ICD-10-CM | POA: Diagnosis present

## 2020-09-06 DIAGNOSIS — N318 Other neuromuscular dysfunction of bladder: Secondary | ICD-10-CM | POA: Diagnosis present

## 2020-09-06 DIAGNOSIS — R531 Weakness: Secondary | ICD-10-CM | POA: Diagnosis present

## 2020-09-06 DIAGNOSIS — E11649 Type 2 diabetes mellitus with hypoglycemia without coma: Secondary | ICD-10-CM | POA: Diagnosis not present

## 2020-09-06 DIAGNOSIS — R7309 Other abnormal glucose: Secondary | ICD-10-CM

## 2020-09-06 DIAGNOSIS — E538 Deficiency of other specified B group vitamins: Principal | ICD-10-CM | POA: Diagnosis present

## 2020-09-06 DIAGNOSIS — Z8249 Family history of ischemic heart disease and other diseases of the circulatory system: Secondary | ICD-10-CM | POA: Diagnosis not present

## 2020-09-06 DIAGNOSIS — F419 Anxiety disorder, unspecified: Secondary | ICD-10-CM | POA: Diagnosis present

## 2020-09-06 DIAGNOSIS — N39498 Other specified urinary incontinence: Secondary | ICD-10-CM | POA: Diagnosis present

## 2020-09-06 DIAGNOSIS — F418 Other specified anxiety disorders: Secondary | ICD-10-CM | POA: Diagnosis not present

## 2020-09-06 DIAGNOSIS — D539 Nutritional anemia, unspecified: Secondary | ICD-10-CM | POA: Diagnosis present

## 2020-09-06 DIAGNOSIS — I10 Essential (primary) hypertension: Secondary | ICD-10-CM | POA: Diagnosis present

## 2020-09-06 DIAGNOSIS — G8250 Quadriplegia, unspecified: Secondary | ICD-10-CM | POA: Diagnosis present

## 2020-09-06 DIAGNOSIS — N319 Neuromuscular dysfunction of bladder, unspecified: Secondary | ICD-10-CM | POA: Diagnosis not present

## 2020-09-06 DIAGNOSIS — Z8616 Personal history of COVID-19: Secondary | ICD-10-CM

## 2020-09-06 DIAGNOSIS — Z86711 Personal history of pulmonary embolism: Secondary | ICD-10-CM

## 2020-09-06 DIAGNOSIS — I959 Hypotension, unspecified: Secondary | ICD-10-CM | POA: Diagnosis present

## 2020-09-06 DIAGNOSIS — E1165 Type 2 diabetes mellitus with hyperglycemia: Secondary | ICD-10-CM | POA: Diagnosis present

## 2020-09-06 DIAGNOSIS — D61818 Other pancytopenia: Secondary | ICD-10-CM | POA: Diagnosis present

## 2020-09-06 DIAGNOSIS — R0989 Other specified symptoms and signs involving the circulatory and respiratory systems: Secondary | ICD-10-CM

## 2020-09-06 DIAGNOSIS — R4589 Other symptoms and signs involving emotional state: Secondary | ICD-10-CM

## 2020-09-06 DIAGNOSIS — K592 Neurogenic bowel, not elsewhere classified: Secondary | ICD-10-CM | POA: Diagnosis present

## 2020-09-06 DIAGNOSIS — E519 Thiamine deficiency, unspecified: Secondary | ICD-10-CM | POA: Diagnosis present

## 2020-09-06 DIAGNOSIS — E119 Type 2 diabetes mellitus without complications: Secondary | ICD-10-CM | POA: Diagnosis not present

## 2020-09-06 DIAGNOSIS — R269 Unspecified abnormalities of gait and mobility: Secondary | ICD-10-CM | POA: Diagnosis not present

## 2020-09-06 LAB — GLUCOSE, CAPILLARY
Glucose-Capillary: 412 mg/dL — ABNORMAL HIGH (ref 70–99)
Glucose-Capillary: 267 mg/dL — ABNORMAL HIGH (ref 70–99)
Glucose-Capillary: 105 mg/dL — ABNORMAL HIGH (ref 70–99)
Glucose-Capillary: 280 mg/dL — ABNORMAL HIGH (ref 70–99)

## 2020-09-06 MED ORDER — FLEET ENEMA 7-19 GM/118ML RE ENEM: 1.0000 | ENEMA | Freq: Once | RECTAL | Status: DC | PRN

## 2020-09-06 MED ORDER — INSULIN ASPART PROT & ASPART (70-30 MIX) 100 UNIT/ML ~~LOC~~ SUSP
25.0000 [IU] | Freq: Two times a day (BID) | SUBCUTANEOUS | Status: DC
  Administered 2020-09-07 – 2020-09-08 (×2): 25 [IU] via SUBCUTANEOUS
  Filled 2020-09-06: qty 10

## 2020-09-06 MED ORDER — VITAMIN D (ERGOCALCIFEROL) 1.25 MG (50000 UNIT) PO CAPS
50000.0000 [IU] | ORAL_CAPSULE | ORAL | Status: DC
  Administered 2020-09-07 – 2020-09-28 (×4): 50000 [IU] via ORAL
  Filled 2020-09-06 (×4): qty 1

## 2020-09-06 MED ORDER — ALPRAZOLAM 0.25 MG PO TABS
0.2500 mg | ORAL_TABLET | Freq: Three times a day (TID) | ORAL | Status: DC | PRN
  Administered 2020-09-06 – 2020-09-28 (×6): 0.25 mg via ORAL
  Filled 2020-09-06 (×6): qty 1

## 2020-09-06 MED ORDER — INSULIN ASPART 100 UNIT/ML IJ SOLN
0.0000 [IU] | Freq: Every day | INTRAMUSCULAR | Status: DC
Start: 2020-09-06 — End: 2020-10-03
  Administered 2020-09-06: 3 [IU] via SUBCUTANEOUS
  Administered 2020-09-07: 4 [IU] via SUBCUTANEOUS
  Administered 2020-09-17: 2 [IU] via SUBCUTANEOUS
  Administered 2020-09-22: 4 [IU] via SUBCUTANEOUS
  Administered 2020-09-26: 2 [IU] via SUBCUTANEOUS
  Administered 2020-09-29: 4 [IU] via SUBCUTANEOUS

## 2020-09-06 MED ORDER — THIAMINE HCL 100 MG/ML IJ SOLN: 100.0000 mg | Freq: Every day | INTRAMUSCULAR | Status: DC

## 2020-09-06 MED ORDER — CYANOCOBALAMIN 1000 MCG/ML IJ SOLN: 1000.0000 ug | INTRAMUSCULAR | 0 refills | Status: DC

## 2020-09-06 MED ORDER — PROCHLORPERAZINE 25 MG RE SUPP: 12.5000 mg | Freq: Four times a day (QID) | RECTAL | Status: DC | PRN

## 2020-09-06 MED ORDER — INSULIN ASPART PROT & ASPART (70-30 MIX) 100 UNIT/ML ~~LOC~~ SUSP
25.0000 [IU] | Freq: Two times a day (BID) | SUBCUTANEOUS | Status: DC
  Administered 2020-09-06: 25 [IU] via SUBCUTANEOUS

## 2020-09-06 MED ORDER — THIAMINE HCL 100 MG PO TABS
100.0000 mg | ORAL_TABLET | Freq: Every day | ORAL | Status: DC
  Administered 2020-09-07 – 2020-10-03 (×27): 100 mg via ORAL
  Filled 2020-09-06 (×27): qty 1

## 2020-09-06 MED ORDER — INSULIN ASPART 100 UNIT/ML IJ SOLN
0.0000 [IU] | Freq: Three times a day (TID) | INTRAMUSCULAR | Status: DC
  Administered 2020-09-06: 5 [IU] via SUBCUTANEOUS

## 2020-09-06 MED ORDER — ACETAMINOPHEN 325 MG PO TABS
325.0000 mg | ORAL_TABLET | ORAL | Status: DC | PRN
  Administered 2020-09-19: 650 mg via ORAL
  Filled 2020-09-06: qty 2

## 2020-09-06 MED ORDER — TRAZODONE HCL 50 MG PO TABS: 25.0000 mg | ORAL_TABLET | Freq: Every evening | ORAL | Status: DC | PRN

## 2020-09-06 MED ORDER — POLYETHYLENE GLYCOL 3350 17 G PO PACK: 17.0000 g | PACK | Freq: Every day | ORAL | Status: DC | PRN

## 2020-09-06 MED ORDER — ALUM & MAG HYDROXIDE-SIMETH 200-200-20 MG/5ML PO SUSP
30.0000 mL | ORAL | Status: DC | PRN
  Filled 2020-09-06: qty 30

## 2020-09-06 MED ORDER — BISACODYL 10 MG RE SUPP: 10.0000 mg | Freq: Every day | RECTAL | Status: DC | PRN

## 2020-09-06 MED ORDER — INSULIN ASPART 100 UNIT/ML IJ SOLN
0.0000 [IU] | Freq: Three times a day (TID) | INTRAMUSCULAR | Status: DC
Start: 2020-09-06 — End: 2020-10-03
  Administered 2020-09-07 (×3): 7 [IU] via SUBCUTANEOUS
  Administered 2020-09-08: 3 [IU] via SUBCUTANEOUS
  Administered 2020-09-08: 2 [IU] via SUBCUTANEOUS
  Administered 2020-09-08: 5 [IU] via SUBCUTANEOUS
  Administered 2020-09-09: 1 [IU] via SUBCUTANEOUS
  Administered 2020-09-09 (×2): 2 [IU] via SUBCUTANEOUS
  Administered 2020-09-10: 1 [IU] via SUBCUTANEOUS
  Administered 2020-09-10: 2 [IU] via SUBCUTANEOUS
  Administered 2020-09-10: 1 [IU] via SUBCUTANEOUS
  Administered 2020-09-11 – 2020-09-12 (×3): 2 [IU] via SUBCUTANEOUS
  Administered 2020-09-12 (×2): 3 [IU] via SUBCUTANEOUS
  Administered 2020-09-13: 1 [IU] via SUBCUTANEOUS
  Administered 2020-09-13: 2 [IU] via SUBCUTANEOUS
  Administered 2020-09-13: 3 [IU] via SUBCUTANEOUS
  Administered 2020-09-14: 2 [IU] via SUBCUTANEOUS
  Administered 2020-09-14: 3 [IU] via SUBCUTANEOUS
  Administered 2020-09-15 (×2): 1 [IU] via SUBCUTANEOUS
  Administered 2020-09-15: 2 [IU] via SUBCUTANEOUS
  Administered 2020-09-16: 5 [IU] via SUBCUTANEOUS
  Administered 2020-09-17: 2 [IU] via SUBCUTANEOUS
  Administered 2020-09-17: 7 [IU] via SUBCUTANEOUS
  Administered 2020-09-18: 1 [IU] via SUBCUTANEOUS
  Administered 2020-09-18: 3 [IU] via SUBCUTANEOUS
  Administered 2020-09-18: 2 [IU] via SUBCUTANEOUS
  Administered 2020-09-19 (×2): 1 [IU] via SUBCUTANEOUS
  Administered 2020-09-20 (×2): 2 [IU] via SUBCUTANEOUS
  Administered 2020-09-21: 5 [IU] via SUBCUTANEOUS
  Administered 2020-09-22: 3 [IU] via SUBCUTANEOUS
  Administered 2020-09-23: 5 [IU] via SUBCUTANEOUS
  Administered 2020-09-23: 2 [IU] via SUBCUTANEOUS
  Administered 2020-09-24: 3 [IU] via SUBCUTANEOUS
  Administered 2020-09-24 – 2020-09-25 (×2): 1 [IU] via SUBCUTANEOUS
  Administered 2020-09-25: 3 [IU] via SUBCUTANEOUS
  Administered 2020-09-26 (×2): 5 [IU] via SUBCUTANEOUS
  Administered 2020-09-27: 7 [IU] via SUBCUTANEOUS
  Administered 2020-09-27: 5 [IU] via SUBCUTANEOUS
  Administered 2020-09-28: 2 [IU] via SUBCUTANEOUS
  Administered 2020-09-28: 5 [IU] via SUBCUTANEOUS
  Administered 2020-09-29: 2 [IU] via SUBCUTANEOUS
  Administered 2020-09-29 – 2020-09-30 (×2): 5 [IU] via SUBCUTANEOUS
  Administered 2020-09-30: 3 [IU] via SUBCUTANEOUS
  Administered 2020-10-01: 9 [IU] via SUBCUTANEOUS
  Administered 2020-10-01: 1 [IU] via SUBCUTANEOUS
  Administered 2020-10-01: 2 [IU] via SUBCUTANEOUS
  Administered 2020-10-02: 3 [IU] via SUBCUTANEOUS
  Administered 2020-10-02: 2 [IU] via SUBCUTANEOUS
  Administered 2020-10-02: 1 [IU] via SUBCUTANEOUS
  Administered 2020-10-03: 5 [IU] via SUBCUTANEOUS
  Administered 2020-10-03: 2 [IU] via SUBCUTANEOUS

## 2020-09-06 MED ORDER — PROCHLORPERAZINE MALEATE 5 MG PO TABS
5.0000 mg | ORAL_TABLET | Freq: Four times a day (QID) | ORAL | Status: DC | PRN
  Administered 2020-09-08: 10 mg via ORAL
  Filled 2020-09-06: qty 2

## 2020-09-06 MED ORDER — GUAIFENESIN-DM 100-10 MG/5ML PO SYRP: 5.0000 mL | ORAL_SOLUTION | Freq: Four times a day (QID) | ORAL | Status: DC | PRN

## 2020-09-06 MED ORDER — PROCHLORPERAZINE EDISYLATE 10 MG/2ML IJ SOLN: 5.0000 mg | Freq: Four times a day (QID) | INTRAMUSCULAR | Status: DC | PRN

## 2020-09-06 MED ORDER — INSULIN ASPART 100 UNIT/ML IJ SOLN: 0.0000 [IU] | Freq: Every day | INTRAMUSCULAR | Status: DC

## 2020-09-06 MED ORDER — CYANOCOBALAMIN 1000 MCG/ML IJ SOLN
1000.0000 ug | INTRAMUSCULAR | Status: DC
  Administered 2020-09-07 – 2020-09-28 (×4): 1000 ug via INTRAMUSCULAR
  Filled 2020-09-06 (×4): qty 1

## 2020-09-06 MED ORDER — SIMVASTATIN 20 MG PO TABS
40.0000 mg | ORAL_TABLET | Freq: Every day | ORAL | Status: DC
  Administered 2020-09-06 – 2020-10-02 (×27): 40 mg via ORAL
  Filled 2020-09-06 (×30): qty 2

## 2020-09-06 MED ORDER — THIAMINE HCL 100 MG PO TABS: 100.0000 mg | ORAL_TABLET | Freq: Every day | ORAL | Status: DC

## 2020-09-06 MED ORDER — INSULIN ASPART 100 UNIT/ML IJ SOLN
9.0000 [IU] | Freq: Once | INTRAMUSCULAR | Status: AC
  Administered 2020-09-06: 9 [IU] via SUBCUTANEOUS

## 2020-09-06 MED ORDER — DIPHENHYDRAMINE HCL 12.5 MG/5ML PO ELIX: 12.5000 mg | ORAL_SOLUTION | Freq: Four times a day (QID) | ORAL | Status: DC | PRN

## 2020-09-06 MED ORDER — ASPIRIN EC 81 MG PO TBEC
81.0000 mg | DELAYED_RELEASE_TABLET | Freq: Every day | ORAL | Status: DC
  Administered 2020-09-07 – 2020-10-03 (×27): 81 mg via ORAL
  Filled 2020-09-06 (×28): qty 1

## 2020-09-06 MED ORDER — DOCUSATE SODIUM 100 MG PO CAPS: 100.0000 mg | ORAL_CAPSULE | Freq: Two times a day (BID) | ORAL | Status: DC | PRN

## 2020-09-06 NOTE — Progress Notes (Signed)
Results for ANONA, GIOVANNINI (MRN [id number]) as of 09/06/2020 09:05  Ref. Range 09/04/2020 21:02 09/05/2020 06:07 09/05/2020 11:08 09/05/2020 17:20 09/06/2020 06:27  Glucose-Capillary Latest Ref Range: 70 - 99 mg/dL 947 (H) 096 (H) 283 (H) 266 (H) 412 (H)  Noted that fasting CBG today was 412 mg/dl. Noted that 70/30 insulin was increased to 25 units BID.   If blood sugars continue to be elevated, recommend adding Novolog 0-5 units correction scale at HS.   Will continue to monitor blood sugars while in the hospital.  Smith Mince RN BSN CDE Diabetes Coordinator Pager: [phone]  8am-5pm

## 2020-09-06 NOTE — H&P (Signed)
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  Neurology consulted for input and patient noted to have sensory and proprioceptive deficits bilateral feet BLE and finger tips concerning for large fiber neuropathy and concerning for B12 deficiency. She has refused to remove socks for exam of feet.  Labs revealed Vitamin B12 and thiamine deficiency with elevated MMA and homocysteine levels. Zinc and copper WNL.  She was started on IM B12 supplement with improvement in symptoms. Neurology recommends life long vitamin B 12 supplement after loading dose for dorsal column syndrome.  and    Pt reports eats a "nromal diet" but avoids beef; also no MVI; denies any EotOH use.    LBM 3 days ago- required miralax at home to have regular BM's- says has "some control" but it's not normal of Bowel and bladder.  Denies spasticity- weak all over- not just in 1 area.  Severe nerve pain in Legs- knees down- cannot tolerate being touched- has SCDs and grippy socks on.  C/o feeling off balance- no dizziness- when stands- like falling forward- freaked her out- Xanax has helped.    Review of Systems  Constitutional:  Negative for chills and fever.  HENT:  Negative for hearing loss and tinnitus.   Eyes:  Negative for blurred vision and double vision.  Respiratory:  Negative for cough and shortness of breath.   Cardiovascular:   Negative for chest pain and palpitations.  Gastrointestinal:  Positive for constipation (has had issues lately--had to take miralax). Negative for heartburn and nausea.  Genitourinary:        Has had problems with control  Musculoskeletal:  Negative for back pain, joint pain, myalgias and neck pain.  Skin:  Negative for rash.  Neurological:  Positive for sensory change and weakness. Negative for dizziness and headaches.  Psychiatric/Behavioral:  The patient is nervous/anxious. The patient does not have insomnia.   All other systems reviewed and are negative.         Past Medical History:  Diagnosis Date   COVID-19 04/2012   Diabetes mellitus without complication (HCC)     Hypertension             Past Surgical History:  Procedure Laterality Date   ABDOMINAL HYSTERECTOMY               Family History  Problem Relation Age of Onset   Healthy Mother     COPD Father     Healthy Sister     HIV Brother     Healthy Sister     HIV Sister     HIV Brother     HIV Brother     HIV Brother     HIV Brother     CAD Neg Hx     Diabetes Mellitus II Neg Hx          Social History:  Married.  Used to work in a [employer].  She reports that she has never smoked. She has never used smokeless tobacco. She reports that she does not drink alcohol and does not use drugs.     Allergies: No Known Allergies           Medications Prior to Admission  Medication Sig Dispense Refill   acetaminophen (TYLENOL) 325 MG tablet Take 325-650 mg by mouth every 6 (six) hours as needed for moderate pain or headache.       ALPRAZolam (XANAX) 0.25 MG tablet Take 1 tablet (0.25 mg total) by mouth 3 (three) times daily as needed for anxiety. 10 tablet 0   amLODipine (NORVASC) 10 MG tablet Take 10 mg by mouth every morning.       aspirin EC 81 MG tablet Take 81 mg by mouth daily. Swallow whole.       docusate sodium (COLACE) 250 MG capsule Take 1 capsule (250 mg total) by mouth daily. (Patient taking  differently: Take 250 mg by mouth daily as needed for constipation.) 10 capsule 0   famotidine (PEPCID) 20 MG tablet Take 20 mg by mouth daily as needed for heartburn or indigestion.       hydrocortisone (ANUSOL-HC) 2.5 % rectal cream Place 1 application rectally 2 (two) times daily. 30 g 0   hydrOXYzine (ATARAX/VISTARIL) 25 MG tablet Take 1 tablet (25 mg total) by mouth every 6 (six) hours as needed for anxiety. 12 tablet 0   insulin aspart protamine- aspart (NOVOLOG MIX 70/30) (70-30) 100 UNIT/ML injection Inject 0.18-0.35 mLs (18-35 Units total) into the skin See admin instructions. Inject 35 units in the morning and 18 units at night (Patient taking differently: Inject 25-30 Units into the skin See admin instructions. Inject 30  units in the morning and  25 units at night) 10 mL 11   JANUVIA 100 MG tablet Take 100 mg by mouth daily.       lisinopril-hydrochlorothiazide (ZESTORETIC) 20-25 MG tablet Take 1 tablet by mouth 2 (two) times daily.       metFORMIN (GLUCOPHAGE) 500 MG tablet Take 1 tablet (500 mg total) by mouth 2 (two) times daily.       pantoprazole (PROTONIX) 40 MG tablet Take 1 tablet (40 mg total) by mouth daily at 12 noon. (Patient taking differently: Take 40 mg by mouth daily as needed (heartburn).) 30 tablet 0   simvastatin (ZOCOR) 40 MG tablet Take 40 mg by mouth at bedtime.       trolamine salicylate (ASPERCREME/ALOE) 10 % cream Apply 1 application topically as needed for muscle pain. 85 g 0   Vitamin D, Ergocalciferol, (DRISDOL) 1.25 MG (50000 UNIT) CAPS capsule Take 50,000 Units by mouth every Friday.       dexamethasone (DECADRON) 6 MG tablet Take 1 tablet (6 mg total) by mouth daily. (Patient not taking: No sig reported) 3 tablet 0   lidocaine (XYLOCAINE) 2 % jelly Apply 1 application topically as needed. (Patient not taking: No sig reported) 30 mL 0   Nitroglycerin 0.4 % OINT After cleansing apply twice daily to rectum for 4 weeks (Patient not taking: No sig reported) 30 g 0       Drug Regimen Review  Drug regimen was reviewed and remains appropriate with no significant issues identified   Home: Home Living Family/patient expects to be discharged to:: Private residence Living Arrangements: Spouse/significant other Available Help at Discharge: Family, Available 24 hours/day Type of Home: House Home Access: Stairs to enter Entrance  Stairs-Number of Steps: 4 Entrance Stairs-Rails: None Home Layout: One level Bathroom Shower/Tub: Health visitor: Standard Bathroom Accessibility: Yes Home Equipment: Environmental consultant - 4 wheels   Functional History: Prior Function Level of Independence: Independent with assistive device(s) Comments: Was using 4WRW at times for mobility, increasing use over the past 3 weeks.  Needing increasing assist for ADL/IADL as weakness has progressed.   Functional Status:  Mobility: Bed Mobility Overal bed mobility: Needs Assistance Bed Mobility: Supine to Sit Supine to sit: Mod assist, HOB elevated Sit to supine: Total assist, +2 for physical assistance General bed mobility comments: HOB elevated, pt able to use bilateral rails to pull trunk into upright seated position and bring LE's over EOB to sit. Bed pad utilized for scooting out fully. +2 for return to supine at end of session due to anxiety. Transfers Overall transfer level: Needs assistance Equipment used: 2 person hand held assist Transfer via Lift Equipment: Stedy Transfers: Sit to/from Stand Sit to Stand: +2 physical assistance, Mod assist, Max assist Stand pivot transfers: Total assist, +2 physical assistance, +2 safety/equipment Squat pivot transfers: Total assist, +2 safety/equipment, +2 physical assistance  Lateral/Scoot Transfers: Total assist, +2 physical assistance, +2 safety/equipment General transfer comment: Attempted with Stedy. Pt required block of feet to remain on platform, otherwise sliding forward in preparation to stand. Stood x4 with last stand  being the best. Pt with difficulty fully extending hips and knees enough to get Stedy flaps under hips. On last stand, peri-care performed, and pt was able to achieve more upright posture. Ambulation/Gait General Gait Details: Unable to attempt   ADL: ADL Overall ADL's : Needs assistance/impaired Grooming: Wash/dry hands, Wash/dry face, Set up, Bed level Grooming Details (indicate cue type and reason): required assistance with toothpaste cap;pt otherwise completed ADL after setup in supported chair position in bed Upper Body Bathing: Set up, Sitting Upper Body Dressing : Moderate assistance, Bed level Lower Body Dressing: Maximal assistance, Bed level Lower Body Dressing Details (indicate cue type and reason): to don/doff socks Functional mobility during ADLs: Total assistance General ADL Comments: pt tolerated long sitting in bed with BUE support on rails to address fear of falling and anxiety with movement   Cognition: Cognition Overall Cognitive Status: Impaired/Different from baseline Orientation Level: Oriented X4 Cognition Arousal/Alertness: Awake/alert Behavior During Therapy: Anxious Overall Cognitive Status: Impaired/Different from baseline Area of Impairment: Following commands, Problem solving, Awareness, Safety/judgement Following Commands: Follows one step commands inconsistently, Follows one step commands with increased time Safety/Judgement: Decreased awareness of deficits Awareness: Emergent Problem Solving: Slow processing, Difficulty sequencing, Requires verbal cues, Requires tactile cues General Comments: slow processing and following commands inconsistently. requires increased time to perform tasks. Decreased awareness into deficits. Very anxious, requiring physical touch from therapist to feel secure.     Blood pressure (!) 142/67, pulse 80, temperature 99.3 F (37.4 C), temperature source Oral, resp. rate 18, height 5\' 1"  (1.549 m), weight 79.7 kg, SpO2 100  %. Physical Exam Vitals and nursing note reviewed. Exam conducted with a chaperone present.  Constitutional:      Appearance: Normal appearance.     Comments: Younger than stated age woman sitting up in bed; appropriate, NAD; PA at bedside  HENT:     Head: Normocephalic and atraumatic.     Comments: Smile equal     Right Ear: External ear normal.     Left Ear: External ear normal.     Nose: No congestion.     Mouth/Throat:     Mouth:  Mucous membranes are dry.     Pharynx: Oropharynx is clear. No oropharyngeal exudate.  Eyes:     General:        Right eye: No discharge.        Left eye: No discharge.     Extraocular Movements: Extraocular movements intact.  Cardiovascular:     Rate and Rhythm: Normal rate and regular rhythm.     Heart sounds: Normal heart sounds. No murmur heard.   No gallop.  Pulmonary:     Comments: CTA B/L- no W/R/R- good air movement Abdominal:     Comments: Soft, NT, ND, (+)BS - hypoactive- no tinkling  Genitourinary:    Comments: Purewick in place- medium amber urine in container; Skin on privates and buttock look great- no skin breakdown Musculoskeletal:     Cervical back: Normal range of motion. No rigidity.     Comments: RUE- 4/5 except FA 4/5 LUE- 4/5 except FA 4-/5 RLE- HF 3-/5, at least 3/5 otherwise- so painful, cannot tolerate me touching her feet/legs LLE- HF 3+/5 -otherwise at least 3/5  Skin:    Comments: Bilateral fingers and palms with discoloration and mild edema.  Slightly cool to touch Buttocks and privates- no skin breakdown R forearm IV- looks OK  Neurological:     Mental Status: She is alert.     Comments: Intact to light touch in UE B/L- she says "couldn't feel at all 2 days ago, but now normal"??? I think might be less /decreased to light touch knees down B/L    Psychiatric:     Comments: Appropriate, interactive      Lab Results Last 48 Hours        Results for orders placed or performed during the hospital encounter of  08/30/20 (from the past 48 hour(s))  Glucose, capillary     Status: Abnormal    Collection Time: 09/04/20  4:50 PM  Result Value Ref Range    Glucose-Capillary 200 (H) 70 - 99 mg/dL      Comment: Glucose reference range applies only to samples taken after fasting for at least 8 hours.  Glucose, capillary     Status: Abnormal    Collection Time: 09/04/20  9:02 PM  Result Value Ref Range    Glucose-Capillary 198 (H) 70 - 99 mg/dL      Comment: Glucose reference range applies only to samples taken after fasting for at least 8 hours.    Comment 1 Notify RN      Comment 2 Document in Chart    Basic metabolic panel     Status: Abnormal    Collection Time: 09/05/20  3:26 AM  Result Value Ref Range    Sodium 139 135 - 145 mmol/L    Potassium 4.8 3.5 - 5.1 mmol/L    Chloride 111 98 - 111 mmol/L    CO2 21 (L) 22 - 32 mmol/L    Glucose, Bld 301 (H) 70 - 99 mg/dL      Comment: Glucose reference range applies only to samples taken after fasting for at least 8 hours.    BUN 45 (H) 8 - 23 mg/dL    Creatinine, Ser 3.29 (H) 0.44 - 1.00 mg/dL    Calcium 8.6 (L) 8.9 - 10.3 mg/dL    GFR, Estimated 49 (L) >60 mL/min      Comment: (NOTE) Calculated using the CKD-EPI Creatinine Equation (2021)      Anion gap 7 5 - 15      Comment:  Performed at [facility], [address] [place], [place] [address]  Magnesium     Status: None    Collection Time: 09/05/20  3:26 AM  Result Value Ref Range    Magnesium 2.2 1.7 - 2.4 mg/dL      Comment: Performed at [facility], [address], [place], [place] [address]  Glucose, capillary     Status: Abnormal    Collection Time: 09/05/20  6:07 AM  Result Value Ref Range    Glucose-Capillary 362 (H) 70 - 99 mg/dL      Comment: Glucose reference range applies only to samples taken after fasting for at least 8 hours.    Comment 1 Notify RN      Comment 2 Document in Chart    Glucose, capillary     Status: Abnormal    Collection Time: 09/05/20 11:08 AM   Result Value Ref Range    Glucose-Capillary 198 (H) 70 - 99 mg/dL      Comment: Glucose reference range applies only to samples taken after fasting for at least 8 hours.  Glucose, capillary     Status: Abnormal    Collection Time: 09/05/20  5:20 PM  Result Value Ref Range    Glucose-Capillary 266 (H) 70 - 99 mg/dL      Comment: Glucose reference range applies only to samples taken after fasting for at least 8 hours.  Glucose, capillary     Status: Abnormal    Collection Time: 09/06/20  6:27 AM  Result Value Ref Range    Glucose-Capillary 412 (H) 70 - 99 mg/dL      Comment: Glucose reference range applies only to samples taken after fasting for at least 8 hours.  Glucose, capillary     Status: Abnormal    Collection Time: 09/06/20 11:42 AM  Result Value Ref Range    Glucose-Capillary 267 (H) 70 - 99 mg/dL      Comment: Glucose reference range applies only to samples taken after fasting for at least 8 hours.      Imaging Results (Last 48 hours)  No results found.           Medical Problem List and Plan: 1.  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  Antithrombotics: -DVT/anticoagulation:  Mechanical: Sequential compression devices, below knee Bilateral lower extremities since Plts are 66k             -antiplatelet therapy: ASA             --H/o PE last year-- will check BLE dopplers with recent immobility/BLE weakness 3. Pain Management: Tylenol prn.  4. Mood: LCSW to follow for evaluation and support.              -antipsychotic agents: N/A 5. Neuropsych: This patient is capable of making decisions on his own behalf. 6. Skin/Wound Care: Routine pressure relief measures.  7. Fluids/Electrolytes/Nutrition: Monitor I/O. Check lytes in am.  8.  T2DM: Hgb A1C-7.8--monitor BS ac/hs and use SSI for elevated Bs             --Continue 70/30  insulin 25 units bid.  9. Vitamin B12: B12< 50.               --B12  1000 mcg daily X 7 days-->weekly for 7 weeks then monthly.  --lifelong B12 1000 mcg monthly supplement after loading doses.  10. 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 Thrombocytopenia: Platelets with steady drop 98 --> 68.             --was 149-171 range earlier this month and not on any heparin products.              --pancytopenia noted-->drop in WBC/HGB/plts over past year w/hypersegmented neutrophils --? Hematology consult.   11. Vitamin B1 deficiency: Thiamine 53.1--continue supplement.  12. Anxiety d/o: Continue Xanax prn.  13. 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         Jacquelynn Cree, PA-C 09/06/2020    I have personally performed a face to face diagnostic evaluation of this patient and formulated the key components of the plan.  Additionally, I have personally reviewed laboratory data, imaging studies, as well as relevant notes and concur with the physician assistant's documentation above.   The patient's status has not changed from the original H&P.  Any changes in documentation from the acute care chart have been noted above.

## 2020-09-06 NOTE — Progress Notes (Signed)
During admission skin assessment, patient asked [place] not to remove socks and assess skin on feet. Demeanor was anxious and tearful. Deferred to patient's wishes and provided emotional support.

## 2020-09-06 NOTE — Congregational Nurse Program (Signed)
Pt will not allow RN to assess the feet. MD made aware.

## 2020-09-06 NOTE — Progress Notes (Signed)
Courtney Heys, MD   Physician  Physical Medicine and Rehabilitation  PMR Pre-admission     Signed  Date of Service:  09/05/2020  3:57 PM       Related encounter: ED to Hosp-Admission (Discharged) from 08/30/2020 in [facility]       Signed      Show:Clear all [x] Written[x] Templated[] Copied  Added by: [x] Julious Payer Vertis Kelch, RN[x] Courtney Heys, MD  [] Hover for details                                                                                                                                                                                                                                                                                                                                                                                                                                     PMR Admission Coordinator Pre-Admission Assessment   Patient: Diane Willis is an 65 y.o., female MRN: [id number] DOB: 1955/04/18 Height: 5' 1"  (154.9 cm) Weight: 79.7 kg Insurance Information HMO:     PPO: yes     PCP:      IPA:      80/20:      OTHER:  PRIMARY: [place] BCBS of       Policy#: [id number]      Subscriber: pt CM Name: Elige Ko.      Phone#: [phone]     Fax#: [phone] Pre-Cert#: [id number] updates to same on 09/19/20  Employer: retired [employer] Benefits:  Phone #: [phone]     Name: 8/24 Eff. Date: 01/14/2020     Deduct: $1250      Out of Pocket Max: $4890      Life Max: none CIR: 80%      SNF: 80% 100 days Outpatient: $10 to $52 co pay per visit     Co-Pay: visits per medical neccesity Home Health: 80%      Co-Pay: visits per medical neccesity DME: 80%     Co-Pay: 20% Providers: in network  SECONDARY: none   Financial Counselor:       Phone#:    The Engineer, [employer]" for patients in  Inpatient Rehabilitation Facilities with attached "Privacy Act [place] Records" was provided and verbally reviewed with: N/A   Emergency Contact Information Contact Information       Name Relation Home Work Mobile    Start Spouse [phone]             Current Medical History  Patient Admitting Diagnosis: Subacute combined degeneration of spinal cord   History of Present Illness: 65 year old female with history of type 2 DM on insulin, HTN, HLD who presented on 08/30/2020 with complaints of Lower extremity weakness that began about 3 weeks ago. Started with tingling and numbness amd PCP placed patient on gabapentin. Symptoms progressive.    Workup including CT head and MRI spine which showed some demyelination of her spine consistent with finding of vitamin B12 deficiency. Started on B12 supplements and thiamine supplementation. . Neurology consulted.     Also developed Acute kidney injury that is improving. AKI on CKD stage II with baseline creat 1.1. Felt multifactorial. Renal ultrasound normal. Antihypertensives on hold and lisinopril on hold. Maintain oral intake and Off IV fluid for now.    Type 2 DM, uncontrolled with hyperglycemia. Increased 70/30 insulin and keep on sliding scale insulin.    Anxiety disorder on Xanax and Atarax. Does have fear of falling.   Patient's medical record from [facility]  has been reviewed by the rehabilitation admission coordinator and physician.   Past Medical History      Past Medical History:  Diagnosis Date   COVID-19 04/2012   Diabetes mellitus without complication ([place])     Hypertension        Has the patient had major surgery during 100 days prior to admission? No   Family History   family history includes COPD in her father; HIV in her brother, brother, brother, brother, brother, and sister; Healthy in her mother, sister, and sister.   Current Medications   Current Facility-Administered Medications:     acetaminophen (TYLENOL) tablet 650 mg, 650 mg, Oral, Q6H PRN, 650 mg at 09/01/20 0113 **OR** acetaminophen (TYLENOL) suppository 650 mg, 650 mg, Rectal, Q6H PRN, Rise Patience, MD   ALPRAZolam Duanne Moron) tablet 0.25 mg, 0.25 mg, Oral, TID PRN, Rise Patience, MD, 0.25 mg at 09/05/20 1053   aspirin EC tablet 81 mg, 81 mg, Oral, Daily, Rise Patience, MD, 81 mg at 09/06/20 1116   [COMPLETED] cyanocobalamin ((VITAMIN B-12)) injection 1,000 mcg, 1,000 mcg, Intramuscular, Daily, 1,000 mcg at 09/06/20 1134 **FOLLOWED BY** [START ON 09/07/2020] cyanocobalamin ((VITAMIN B-12)) injection 1,000 mcg, 1,000 mcg, Intramuscular, Weekly, Donnetta Simpers, MD   docusate sodium (COLACE) capsule 100 mg, 100 mg, Oral, BID PRN, Donnetta Simpers, MD, 100 mg at 08/31/20 0634   hydrALAZINE (APRESOLINE) injection 10 mg, 10 mg, Intravenous, Q4H PRN, Hal Hope,  Doreatha Lew, MD   hydrOXYzine (ATARAX/VISTARIL) tablet 25 mg, 25 mg, Oral, Q6H PRN, Rise Patience, MD   insulin aspart (novoLOG) injection 0-5 Units, 0-5 Units, Subcutaneous, QHS, Ghimire, Kuber, MD   insulin aspart (novoLOG) injection 0-9 Units, 0-9 Units, Subcutaneous, TID WC, Ghimire, Kuber, MD   insulin aspart protamine- aspart (NOVOLOG MIX 70/30) injection 25 Units, 25 Units, Subcutaneous, BID WC, Ghimire, Kuber, MD, 25 Units at 09/06/20 1118   ondansetron (ZOFRAN) injection 4 mg, 4 mg, Intravenous, Q6H PRN, Reubin Milan, MD, 4 mg at 09/03/20 2116   simvastatin (ZOCOR) tablet 40 mg, 40 mg, Oral, QHS, Rise Patience, MD, 40 mg at 09/05/20 2115   thiamine (B-1) injection 100 mg, 100 mg, Intravenous, Daily, 100 mg at 08/31/20 0847 **OR** thiamine tablet 100 mg, 100 mg, Oral, Daily, Donnetta Simpers, MD, 100 mg at 09/06/20 1116   Patients Current Diet:  Diet Order                  Diet - low sodium heart healthy             Diet Carb Modified             Diet heart healthy/carb modified Room service appropriate? Yes;  Fluid consistency: Thin  Diet effective now                       Precautions / Restrictions Precautions Precautions: Fall Precaution Comments: ataxic, sensation of falling with movement, anxious Restrictions Weight Bearing Restrictions: No    Has the patient had 2 or more falls or a fall with injury in the past year? Yes   Prior Activity Level Limited Community (1-2x/wk): Mod I with RW as needed, but increased use over past 3 weeks pta   Prior Functional Level Self Care: Did the patient need help bathing, dressing, using the toilet or eating? Needed some help   Indoor Mobility: Did the patient need assistance with walking from room to room (with or without device)? Needed some help   Stairs: Did the patient need assistance with internal or external stairs (with or without device)? Needed some help   Functional Cognition: Did the patient need help planning regular tasks such as shopping or remembering to take medications? Independent   Patient Information Are you of Hispanic, Latino/a,or Spanish origin?: A. No, not of Hispanic, Latino/a, or Spanish origin What is your race?: B. Black or African American Do you need or want an interpreter to communicate with a doctor or health care staff?: 0. No   Patient's Response To:  Health Literacy and Transportation Is the patient able to respond to health literacy and transportation needs?: Yes Health Literacy - How often do you need to have someone help you when you read instructions, pamphlets, or other written material from your doctor or pharmacy?: Never In the past 12 months, has lack of transportation kept you from medical appointments or from getting medications?: No In the past 12 months, has lack of transportation kept you from meetings, work, or from getting things needed for daily living?: No   Home Assistive Devices / Equipment Home Equipment: Environmental consultant - 4 wheels   Prior Device Use: Indicate devices/aids used by the patient  prior to current illness, exacerbation or injury? Walker   Current Functional Level Cognition   Overall Cognitive Status: Impaired/Different from baseline Orientation Level: Oriented X4 Following Commands: Follows one step commands inconsistently, Follows one step commands with increased time Safety/Judgement: Decreased  awareness of deficits General Comments: slow processing and following commands inconsistently. requires increased time to perform tasks. Decreased awareness into deficits. Very anxious, requiring physical touch from therapist to feel secure.    Extremity Assessment (includes Sensation/Coordination)   Upper Extremity Assessment: RUE deficits/detail, LUE deficits/detail RUE Deficits / Details: Ataxia; digits in extension, mass grasp, pt is able to grasp bed rails, grossly elbow flexion/extension 5/5MMT;shoulder flexion AROm ~170, pt reports pain with further range; decreased fine motor skills, RUE Coordination: decreased fine motor, decreased gross motor LUE Deficits / Details: Ataxia;LUE noted 3rd digit with increased extension, gross grasp; LUE elbow flexion/extension 5/5;full AROM shoulder flexion LUE Coordination: decreased fine motor, decreased gross motor  Lower Extremity Assessment: Defer to PT evaluation RLE Deficits / Details: 5/5 strength; very ataxic RLE Sensation: decreased light touch RLE Coordination: decreased fine motor, decreased gross motor LLE Deficits / Details: 5/5 strength; very ataxic LLE Sensation: decreased light touch LLE Coordination: decreased fine motor, decreased gross motor     ADLs   Overall ADL's : Needs assistance/impaired Grooming: Wash/dry hands, Wash/dry face, Set up, Bed level Grooming Details (indicate cue type and reason): required assistance with toothpaste cap;pt otherwise completed ADL after setup in supported chair position in bed Upper Body Bathing: Set up, Sitting Upper Body Dressing : Moderate assistance, Bed level Lower Body  Dressing: Maximal assistance, Bed level Lower Body Dressing Details (indicate cue type and reason): to don/doff socks Functional mobility during ADLs: Total assistance General ADL Comments: pt tolerated long sitting in bed with BUE support on rails to address fear of falling and anxiety with movement     Mobility   Overal bed mobility: Needs Assistance Bed Mobility: Supine to Sit Supine to sit: Mod assist, HOB elevated Sit to supine: Total assist, +2 for physical assistance General bed mobility comments: HOB elevated, pt able to use bilateral rails to pull trunk into upright seated position and bring LE's over EOB to sit. Bed pad utilized for scooting out fully. +2 for return to supine at end of session due to anxiety.     Transfers   Overall transfer level: Needs assistance Equipment used: 2 person hand held assist Transfer via Lift Equipment: Stedy Transfers: Sit to/from Stand Sit to Stand: +2 physical assistance, Mod assist, Max assist Stand pivot transfers: Total assist, +2 physical assistance, +2 safety/equipment Squat pivot transfers: Total assist, +2 safety/equipment, +2 physical assistance  Lateral/Scoot Transfers: Total assist, +2 physical assistance, +2 safety/equipment General transfer comment: Attempted with Stedy. Pt required block of feet to remain on platform, otherwise sliding forward in preparation to stand. Stood x4 with last stand being the best. Pt with difficulty fully extending hips and knees enough to get Stedy flaps under hips. On last stand, peri-care performed, and pt was able to achieve more upright posture.     Ambulation / Gait / Stairs / Wheelchair Mobility   Ambulation/Gait General Gait Details: Unable to attempt     Posture / Balance Dynamic Sitting Balance Sitting balance - Comments: At EOB - mainly limited by anxiety Balance Overall balance assessment: Needs assistance Sitting-balance support: Bilateral upper extremity supported, Feet  supported Sitting balance-Leahy Scale: Poor Sitting balance - Comments: At EOB - mainly limited by anxiety Postural control: Posterior lean Standing balance support: Bilateral upper extremity supported Standing balance-Leahy Scale: Zero Standing balance comment: +2 required     Special needs/care consideration Hgb A1c 7.8    Previous Home Environment  Living Arrangements: Spouse/significant other Available Help at Discharge: Family, Available 24 hours/day  Type of Home: House Home Layout: One level Home Access: Stairs to enter Entrance Stairs-Rails: None Entrance Stairs-Number of Steps: 4 Bathroom Shower/Tub: Multimedia programmer: Standard Bathroom Accessibility: Yes How Accessible: Accessible via walker   Discharge Living Setting Plans for Discharge Living Setting: Patient's home, Lives with (comment) (spouse) Type of Home at Discharge: House Discharge Home Layout: One level Discharge Home Access: Stairs to enter Entrance Stairs-Rails: None Entrance Stairs-Number of Steps: 4 Discharge Bathroom Shower/Tub: Horticulturist, commercial: Standard Discharge Bathroom Accessibility: Yes How Accessible: Accessible via walker Does the patient have any problems obtaining your medications?: No   Social/Family/Support Systems Patient Roles: Spouse Contact Information: spouse, [name] Anticipated Caregiver: spouse Anticipated Ambulance person Information: see above Ability/Limitations of Caregiver: spouse works 10 pm night shift driving truck for [employer], does have leave time Caregiver Availability: 24/7 (pt's mother in Sports coach and sisters can assistt when spouse works) Discharge Plan Discussed with Primary Caregiver: Yes Is Caregiver In Agreement with Plan?: Yes Does Caregiver/Family have Issues with Lodging/Transportation while Pt is in Rehab?: No   Goals Patient/Family Goal for Rehab: supervision to min asisst with PT and OT/ SPouse aware that this may be at  wheelchair level Expected length of stay: ELOS 2 to 3 weeks Pt/Family Agrees to Admission and willing to participate: Yes Program Orientation Provided & Reviewed with Pt/Caregiver Including Roles  & Responsibilities: Yes   Decrease burden of Care through IP rehab admission: n/a   Possible need for SNF placement upon discharge: not anticipated   Patient Condition: I have reviewed medical records from [facility] , spoken with CM, and patient. I met with patient at the bedside for inpatient rehabilitation assessment.  Patient will benefit from ongoing PT and OT, can actively participate in 3 hours of therapy a day 5 days of the week, and can make measurable gains during the admission.  Patient will also benefit from the coordinated team approach during an Inpatient Acute Rehabilitation admission.  The patient will receive intensive therapy as well as Rehabilitation physician, nursing, social worker, and care management interventions.  Due to bladder management, bowel management, safety, skin/wound care, disease management, medication administration, pain management, and patient education the patient requires 24 hour a day rehabilitation nursing.  The patient is currently mod to max assist with Stedy  with mobility and basic ADLs.  Discharge setting and therapy post discharge at home with home health is anticipated.  Patient has agreed to participate in the Acute Inpatient Rehabilitation Program and will admit today.   Preadmission Screen Completed By:  Cleatrice Burke, 09/06/2020 12:35 PM ______________________________________________________________________   Discussed status with Dr. Dagoberto Ligas on 09/06/2020 at 1235 and received approval for admission today.   Admission Coordinator:  Cleatrice Burke, RN, time [id number] Date 09/06/2020    Assessment/Plan: Diagnosis: Does the need for close, 24 hr/day Medical supervision in concert with the patient's rehab needs make it unreasonable for  this patient to be served in a less intensive setting? Yes Co-Morbidities requiring supervision/potential complications: demyelination/degeneration of Spinal cord due to Vit B12 deficiency- CKD II; AKI; anxiety- severe; DM on insulin; HTN; HLD Due to bladder management, bowel management, safety, skin/wound care, disease management, medication administration, pain management, and patient education, does the patient require 24 hr/day rehab nursing? Yes Does the patient require coordinated care of a physician, rehab nurse, PT, OT, and SLP to address physical and functional deficits in the context of the above medical diagnosis(es)? Yes Addressing deficits in the following areas: balance,  endurance, locomotion, strength, transferring, bowel/bladder control, bathing, dressing, feeding, grooming, toileting, cognition, and psychosocial support Can the patient actively participate in an intensive therapy program of at least 3 hrs of therapy 5 days a week? Yes The potential for patient to make measurable gains while on inpatient rehab is good and fair Anticipated functional outcomes upon discharge from inpatient rehab: min assist PT, min assist OT, modified independent SLP Estimated rehab length of stay to reach the above functional goals is: 2-3 weeks Anticipated discharge destination: Home 10. Overall Rehab/Functional Prognosis: good and fair     MD Signature:           Revision History                               Note Details  Jan Fireman, MD File Time 09/06/2020 12:50 PM  Author Type Physician Status Signed  Last Editor Courtney Heys, MD Service Physical Medicine and [place] # [id number] Admit Date 09/06/2020

## 2020-09-06 NOTE — H&P (Signed)
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  Neurology consulted for input and patient noted to have sensory and proprioceptive deficits bilateral feet BLE and finger tips concerning for large fiber neuropathy and concerning for B12 deficiency. She has refused to remove socks for exam of feet.  Labs revealed Vitamin B12 and thiamine deficiency with elevated MMA and homocysteine levels. Zinc and copper WNL.  She was started on IM B12 supplement with improvement in symptoms. Neurology recommends life long vitamin B 12 supplement after loading dose for dorsal column syndrome.  and   Pt reports eats a "nromal diet" but avoids beef; also no MVI; denies any EotOH use.   LBM 3 days ago- required miralax at home to have regular BM's- says has "some control" but it's not normal of Bowel and bladder.  Denies spasticity- weak all over- not just in 1 area.  Severe nerve pain in Legs- knees down- cannot tolerate being touched- has SCDs and grippy socks on.  C/o feeling off balance- no dizziness- when stands- like falling forward- freaked her out- Xanax has helped.   Review of Systems  Constitutional:  Negative for chills and fever.  HENT:  Negative for hearing loss and tinnitus.   Eyes:  Negative for blurred vision and double vision.  Respiratory:  Negative for cough and shortness of breath.   Cardiovascular:  Negative  for chest pain and palpitations.  Gastrointestinal:  Positive for constipation (has had issues lately--had to take miralax). Negative for heartburn and nausea.  Genitourinary:        Has had problems with control  Musculoskeletal:  Negative for back pain, joint pain, myalgias and neck pain.  Skin:  Negative for rash.  Neurological:  Positive for sensory change and weakness. Negative for dizziness and headaches.  Psychiatric/Behavioral:  The patient is nervous/anxious. The patient does not have insomnia.   All other systems reviewed and are negative.   Past Medical History:  Diagnosis Date   COVID-19 04/2012   Diabetes mellitus without complication (HCC)    Hypertension     Past Surgical History:  Procedure Laterality Date   ABDOMINAL HYSTERECTOMY      Family History  Problem Relation Age of Onset   Healthy Mother    COPD Father    Healthy Sister    HIV Brother    Healthy Sister    HIV Sister    HIV Brother    HIV Brother    HIV Brother    HIV Brother    CAD Neg Hx    Diabetes Mellitus II Neg Hx      Social History:  Married. Used to work in a [employer].  She reports that she has never smoked. She has never used smokeless tobacco. She reports that she does not drink alcohol and does not use drugs.   Allergies: No Known Allergies   Medications Prior to  Admission  Medication Sig Dispense Refill   acetaminophen (TYLENOL) 325 MG tablet Take 325-650 mg by mouth every 6 (six) hours as needed for moderate pain or headache.     ALPRAZolam (XANAX) 0.25 MG tablet Take 1 tablet (0.25 mg total) by mouth 3 (three) times daily as needed for anxiety. 10 tablet 0   amLODipine (NORVASC) 10 MG tablet Take 10 mg by mouth every morning.     aspirin EC 81 MG tablet Take 81 mg by mouth daily. Swallow whole.     docusate sodium (COLACE) 250 MG capsule Take 1 capsule (250 mg total) by mouth daily. (Patient taking differently: Take 250 mg by mouth daily as needed for constipation.)  10 capsule 0   famotidine (PEPCID) 20 MG tablet Take 20 mg by mouth daily as needed for heartburn or indigestion.     hydrocortisone (ANUSOL-HC) 2.5 % rectal cream Place 1 application rectally 2 (two) times daily. 30 g 0   hydrOXYzine (ATARAX/VISTARIL) 25 MG tablet Take 1 tablet (25 mg total) by mouth every 6 (six) hours as needed for anxiety. 12 tablet 0   insulin aspart protamine- aspart (NOVOLOG MIX 70/30) (70-30) 100 UNIT/ML injection Inject 0.18-0.35 mLs (18-35 Units total) into the skin See admin instructions. Inject 35 units in the morning and 18 units at night (Patient taking differently: Inject 25-30 Units into the skin See admin instructions. Inject 30  units in the morning and  25 units at night) 10 mL 11   JANUVIA 100 MG tablet Take 100 mg by mouth daily.     lisinopril-hydrochlorothiazide (ZESTORETIC) 20-25 MG tablet Take 1 tablet by mouth 2 (two) times daily.     metFORMIN (GLUCOPHAGE) 500 MG tablet Take 1 tablet (500 mg total) by mouth 2 (two) times daily.     pantoprazole (PROTONIX) 40 MG tablet Take 1 tablet (40 mg total) by mouth daily at 12 noon. (Patient taking differently: Take 40 mg by mouth daily as needed (heartburn).) 30 tablet 0   simvastatin (ZOCOR) 40 MG tablet Take 40 mg by mouth at bedtime.     trolamine salicylate (ASPERCREME/ALOE) 10 % cream Apply 1 application topically as needed for muscle pain. 85 g 0   Vitamin D, Ergocalciferol, (DRISDOL) 1.25 MG (50000 UNIT) CAPS capsule Take 50,000 Units by mouth every Friday.     dexamethasone (DECADRON) 6 MG tablet Take 1 tablet (6 mg total) by mouth daily. (Patient not taking: No sig reported) 3 tablet 0   lidocaine (XYLOCAINE) 2 % jelly Apply 1 application topically as needed. (Patient not taking: No sig reported) 30 mL 0   Nitroglycerin 0.4 % OINT After cleansing apply twice daily to rectum for 4 weeks (Patient not taking: No sig reported) 30 g 0    Drug Regimen Review  Drug regimen was reviewed and remains appropriate  with no significant issues identified  Home: Home Living Family/patient expects to be discharged to:: Private residence Living Arrangements: Spouse/significant other Available Help at Discharge: Family, Available 24 hours/day Type of Home: House Home Access: Stairs to enter [employer] of Steps: 4 Entrance Stairs-Rails: None Home Layout: One level Bathroom Shower/Tub: Health visitor: Standard Bathroom Accessibility: Yes Home Equipment: Environmental consultant - 4 wheels   Functional History: Prior Function Level of Independence: Independent with assistive device(s) Comments: Was using 4WRW at times for mobility, increasing use over the past 3 weeks.  Needing increasing assist for ADL/IADL as weakness has progressed.  Functional Status:  Mobility: Bed Mobility Overal bed mobility: Needs  Assistance Bed Mobility: Supine to Sit Supine to sit: Mod assist, HOB elevated Sit to supine: Total assist, +2 for physical assistance General bed mobility comments: HOB elevated, pt able to use bilateral rails to pull trunk into upright seated position and bring LE's over EOB to sit. Bed pad utilized for scooting out fully. +2 for return to supine at end of session due to anxiety. Transfers Overall transfer level: Needs assistance Equipment used: 2 person hand held assist Transfer via Lift Equipment: Stedy Transfers: Sit to/from Stand Sit to Stand: +2 physical assistance, Mod assist, Max assist Stand pivot transfers: Total assist, +2 physical assistance, +2 safety/equipment Squat pivot transfers: Total assist, +2 safety/equipment, +2 physical assistance  Lateral/Scoot Transfers: Total assist, +2 physical assistance, +2 safety/equipment General transfer comment: Attempted with Stedy. Pt required block of feet to remain on platform, otherwise sliding forward in preparation to stand. Stood x4 with last stand being the best. Pt with difficulty fully extending hips and knees enough to get  Stedy flaps under hips. On last stand, peri-care performed, and pt was able to achieve more upright posture. Ambulation/Gait General Gait Details: Unable to attempt    ADL: ADL Overall ADL's : Needs assistance/impaired Grooming: Wash/dry hands, Wash/dry face, Set up, Bed level Grooming Details (indicate cue type and reason): required assistance with toothpaste cap;pt otherwise completed ADL after setup in supported chair position in bed Upper Body Bathing: Set up, Sitting Upper Body Dressing : Moderate assistance, Bed level Lower Body Dressing: Maximal assistance, Bed level Lower Body Dressing Details (indicate cue type and reason): to don/doff socks Functional mobility during ADLs: Total assistance General ADL Comments: pt tolerated long sitting in bed with BUE support on rails to address fear of falling and anxiety with movement  Cognition: Cognition Overall Cognitive Status: Impaired/Different from baseline Orientation Level: Oriented X4 Cognition Arousal/Alertness: Awake/alert Behavior During Therapy: Anxious Overall Cognitive Status: Impaired/Different from baseline Area of Impairment: Following commands, Problem solving, Awareness, Safety/judgement Following Commands: Follows one step commands inconsistently, Follows one step commands with increased time Safety/Judgement: Decreased awareness of deficits Awareness: Emergent Problem Solving: Slow processing, Difficulty sequencing, Requires verbal cues, Requires tactile cues General Comments: slow processing and following commands inconsistently. requires increased time to perform tasks. Decreased awareness into deficits. Very anxious, requiring physical touch from therapist to feel secure.   Blood pressure (!) 142/67, pulse 80, temperature 99.3 F (37.4 C), temperature source Oral, resp. rate 18, height  (1.549 m), weight 79.7 kg, SpO2 100 %. Physical Exam Vitals and nursing note reviewed. Exam conducted with a chaperone  present.  Constitutional:      Appearance: Normal appearance.     Comments: Younger than stated age woman sitting up in bed; appropriate, NAD; PA at bedside  HENT:     Head: Normocephalic and atraumatic.     Comments: Smile equal     Right Ear: External ear normal.     Left Ear: External ear normal.     Nose: No congestion.     Mouth/Throat:     Mouth: Mucous membranes are dry.     Pharynx: Oropharynx is clear. No oropharyngeal exudate.  Eyes:     General:        Right eye: No discharge.        Left eye: No discharge.     Extraocular Movements: Extraocular movements intact.  Cardiovascular:     Rate and Rhythm: Normal rate and regular rhythm.     Heart sounds: Normal heart sounds. No murmur heard.  No gallop.  Pulmonary:     Comments: CTA B/L- no W/R/R- good air movement Abdominal:     Comments: Soft, NT, ND, (+)BS - hypoactive- no tinkling  Genitourinary:    Comments: Purewick in place- medium amber urine in container; Skin on privates and buttock look great- no skin breakdown Musculoskeletal:     Cervical back: Normal range of motion. No rigidity.     Comments: RUE- 4/5 except FA 4/5 LUE- 4/5 except FA 4-/5 RLE- HF 3-/5, at least 3/5 otherwise- so painful, cannot tolerate me touching her feet/legs LLE- HF 3+/5 -otherwise at least 3/5  Skin:    Comments: Bilateral fingers and palms with discoloration and mild edema.  Slightly cool to touch Buttocks and privates- no skin breakdown R forearm IV- looks OK  Neurological:     Mental Status: She is alert.     Comments: Intact to light touch in UE B/L- she says "couldn't feel at all 2 days ago, but now normal"??? I think might be less /decreased to light touch knees down B/L    Psychiatric:     Comments: Appropriate, interactive    Results for orders placed or performed during the hospital encounter of 08/30/20 (from the past 48 hour(s))  Glucose, capillary     Status: Abnormal   Collection Time: 09/04/20  4:50 PM   Result Value Ref Range   Glucose-Capillary 200 (H) 70 - 99 mg/dL    Comment: Glucose reference range applies only to samples taken after fasting for at least 8 hours.  Glucose, capillary     Status: Abnormal   Collection Time: 09/04/20  9:02 PM  Result Value Ref Range   Glucose-Capillary 198 (H) 70 - 99 mg/dL    Comment: Glucose reference range applies only to samples taken after fasting for at least 8 hours.   Comment 1 Notify RN    Comment 2 Document in Chart   Basic metabolic panel     Status: Abnormal   Collection Time: 09/05/20  3:26 AM  Result Value Ref Range   Sodium 139 135 - 145 mmol/L   Potassium 4.8 3.5 - 5.1 mmol/L   Chloride 111 98 - 111 mmol/L   CO2 21 (L) 22 - 32 mmol/L   Glucose, Bld 301 (H) 70 - 99 mg/dL    Comment: Glucose reference range applies only to samples taken after fasting for at least 8 hours.   BUN 45 (H) 8 - 23 mg/dL   Creatinine, Ser 8.00 (H) 0.44 - 1.00 mg/dL   Calcium 8.6 (L) 8.9 - 10.3 mg/dL   GFR, Estimated 49 (L) >60 mL/min    Comment: (NOTE) Calculated using the CKD-EPI Creatinine Equation (2021)    Anion gap 7 5 - 15    Comment: Performed at [facility], [address] [place], [place] [address]  Magnesium     Status: None   Collection Time: 09/05/20  3:26 AM  Result Value Ref Range   Magnesium 2.2 1.7 - 2.4 mg/dL    Comment: Performed at [facility], [address], [place], [place] [address]  Glucose, capillary     Status: Abnormal   Collection Time: 09/05/20  6:07 AM  Result Value Ref Range   Glucose-Capillary 362 (H) 70 - 99 mg/dL    Comment: Glucose reference range applies only to samples taken after fasting for at least 8 hours.   Comment 1 Notify RN    Comment 2 Document in Chart  Glucose, capillary     Status: Abnormal   Collection Time: 09/05/20 11:08 AM  Result Value Ref Range   Glucose-Capillary 198 (H) 70 - 99 mg/dL    Comment: Glucose reference range applies only to samples taken after fasting for at  least 8 hours.  Glucose, capillary     Status: Abnormal   Collection Time: 09/05/20  5:20 PM  Result Value Ref Range   Glucose-Capillary 266 (H) 70 - 99 mg/dL    Comment: Glucose reference range applies only to samples taken after fasting for at least 8 hours.  Glucose, capillary     Status: Abnormal   Collection Time: 09/06/20  6:27 AM  Result Value Ref Range   Glucose-Capillary 412 (H) 70 - 99 mg/dL    Comment: Glucose reference range applies only to samples taken after fasting for at least 8 hours.  Glucose, capillary     Status: Abnormal   Collection Time: 09/06/20 11:42 AM  Result Value Ref Range   Glucose-Capillary 267 (H) 70 - 99 mg/dL    Comment: Glucose reference range applies only to samples taken after fasting for at least 8 hours.   No results found.     Medical Problem List and Plan: 1.  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  Antithrombotics: -DVT/anticoagulation:  Mechanical: Sequential compression devices, below knee Bilateral lower extremities since Plts are 66k  -antiplatelet therapy: ASA  --H/o PE last year-- will check BLE dopplers with recent immobility/BLE weakness 3. Pain Management: Tylenol prn.  4. Mood: LCSW to follow for evaluation and support.   -antipsychotic agents: N/A 5. Neuropsych: This patient is capable of making decisions on his own behalf. 6. Skin/Wound Care: Routine pressure relief measures.  7. Fluids/Electrolytes/Nutrition: Monitor I/O. Check lytes in am.  8.  T2DM: Hgb A1C-7.8--monitor BS ac/hs and use SSI for elevated Bs  --Continue 70/30 insulin 25 units bid.  9. Vitamin B12: B12< 50.    --B12 1000 mcg daily X 7 days-->weekly for 7 weeks then monthly.  --lifelong B12 1000 mcg monthly supplement after loading doses.  10. 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 Thrombocytopenia: Platelets with steady  drop 98 --> 68.  --was 149-171 range earlier this month and not on any heparin products.   --pancytopenia noted-->drop in WBC/HGB/plts over past year w/hypersegmented neutrophils --? Hematology consult.   11. Vitamin B1 deficiency: Thiamine 53.1--continue supplement.  12. Anxiety d/o: Continue Xanax prn.  13. 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     Jacquelynn Cree, PA-C 09/06/2020   I have personally performed a face to face diagnostic evaluation of this patient and formulated the key components of the plan.  Additionally, I have personally reviewed laboratory data, imaging studies, as well as relevant notes and concur with the physician assistant's documentation above.   The patient's status has not changed from the original H&P.  Any changes in documentation from the acute care chart have been noted above.

## 2020-09-06 NOTE — Progress Notes (Addendum)
0630- Patient morning CBG: [id number]. Notified Dr. Antionette Char. Awaiting response  [id number]- Received call back with orders for one time 9 unit dose of novolog

## 2020-09-06 NOTE — Discharge Summary (Signed)
Physician Discharge Summary  LORELIE BIERMANN ZOX:[id number] DOB: 1955/09/19 DOA: 08/30/2020  PCP: Fleet Contras, MD  Admit date: 08/30/2020 Discharge date: 09/06/2020  Admitted From: Home Disposition: Acute inpatient rehab  Recommendations for Outpatient Follow-up:  Follow up with PCP in 1-2 weeks after discharge. Please obtain BMP/CBC in one week Schedule follow-up with neurology on discharge from rehab.  Home Health: Not applicable Equipment/Devices: Not applicable  Discharge Condition: Stable CODE STATUS: Full code Diet recommendation: Low-salt low-carb diet  Discharge summary: 64-year with history of DM2, HTN, HLD admitted for progressive bilateral lower extremity weakness, numbness and tingling for the past 3 weeks.  Patient underwent extensive work-up including CT head and MRI spine which showed some demyelination of her spine consistent with finding of vitamin B12 deficiency.  Started on B12 supplements.  Neurology team was following.  Also developed acute kidney injury that is improving. Patient does not let us examine her feet even after multiple counseling.   Assessment & Plan:   AKI on CKD stage II: Baseline creatinine 1.1.  Multifactorial acute kidney injury. Creatinine 1.5-1.14-1.41-2.02-1.68-1.34-1.25. Trending down. Renal ultrasound was essentially normal.  Antihypertensives and lisinopril on hold.  Does not need antihypertensives at this time. Maintaining with oral intake.   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 Folate normal. Homocysteine over 250. TSH normal. RPR -, HIV NR, copper 143 , Zinc level is 82 Treating with vitamin B12 supplementation. Vitamin B12 1000 mcg daily for 7/7 days, completed. Please provide vitamin B12 1000 mcg weekly for 7 weeks and then 1000 mcg every month lifelong.  Continue oral thiamine replacement.  Transfer to rehab.   Essential hypertension: On lisinopril and hydrochlorothiazide.  On hold due to AKI.  Blood pressures are stable, low normal.  No indication for antihypertensive at this time.  Type 2 diabetes, uncontrolled with hyperglycemia:  Increase 70/30 insulin to home doses of 35 and 25 units.Marland Kitchen  Keep on sliding scale insulin.     Hyperlipidemia: On statin.   Anxiety disorder: On Xanax and Atarax that she can use.   Stable to go transfer to acute inpatient rehab.   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) Active Problems:   Anemia   Hypertension   Controlled type 2 diabetes mellitus with hyperglycemia [facility])    Discharge Instructions  Discharge Instructions     Diet - low sodium heart healthy   Complete by: As directed    Diet Carb Modified   Complete by: As directed    Increase activity slowly   Complete by: As directed       Allergies as of 09/06/2020   No Known Allergies      Medication List     STOP taking these medications    amLODipine 10 MG tablet Commonly known as: NORVASC   Aspercreme/Aloe 10 % cream Generic drug: trolamine salicylate   dexamethasone 6 MG tablet Commonly known as: DECADRON   lidocaine 2 % jelly Commonly known as: XYLOCAINE   lisinopril-hydrochlorothiazide 20-25 MG tablet Commonly known as: ZESTORETIC   Nitroglycerin 0.4 % Oint       TAKE these medications    acetaminophen 325 MG tablet Commonly known as: TYLENOL Take 325-650 mg by mouth every 6 (six) hours as needed for moderate pain or headache.   ALPRAZolam 0.25 MG tablet Commonly known as: XANAX Take 1 tablet (0.25 mg total) by  mouth 3 (three) times daily as needed for anxiety.   aspirin EC 81 MG tablet Take 81 mg by mouth daily. Swallow whole.   cyanocobalamin 1000 MCG/ML injection Commonly known as: (VITAMIN B-12) Inject 1 mL (1,000 mcg total) into the muscle once a week for 7 doses. Start  taking on: September 07, 2020   docusate sodium 250 MG capsule Commonly known as: COLACE Take 1 capsule (250 mg total) by mouth daily. What changed:  when to take this reasons to take this   famotidine 20 MG tablet Commonly known as: PEPCID Take 20 mg by mouth daily as needed for heartburn or indigestion.   hydrocortisone 2.5 % rectal cream Commonly known as: ANUSOL-HC Place 1 application rectally 2 (two) times daily.   hydrOXYzine 25 MG tablet Commonly known as: ATARAX/VISTARIL Take 1 tablet (25 mg total) by mouth every 6 (six) hours as needed for anxiety.   insulin aspart protamine- aspart (70-30) 100 UNIT/ML injection Commonly known as: NOVOLOG MIX 70/30 Inject 0.18-0.35 mLs (18-35 Units total) into the skin See admin instructions. Inject 35 units in the morning and 18 units at night What changed:  how much to take additional instructions   Januvia 100 MG tablet Generic drug: sitaGLIPtin Take 100 mg by mouth daily.   metFORMIN 500 MG tablet Commonly known as: GLUCOPHAGE Take 1 tablet (500 mg total) by mouth 2 (two) times daily.   pantoprazole 40 MG tablet Commonly known as: PROTONIX Take 1 tablet (40 mg total) by mouth daily at 12 noon. What changed:  when to take this reasons to take this   simvastatin 40 MG tablet Commonly known as: ZOCOR Take 40 mg by mouth at bedtime.   thiamine 100 MG tablet Take 1 tablet (100 mg total) by mouth daily. Start taking on: September 07, 2020   Vitamin D (Ergocalciferol) 1.25 MG (50000 UNIT) Caps capsule Commonly known as: DRISDOL Take 50,000 Units by mouth every Friday.        No Known Allergies  Consultations: Neurology   Procedures/Studies: DG Abdomen 1 View  Result Date: 08/18/2020 CLINICAL DATA:  Abdominal pain and constipation. EXAM: ABDOMEN - 1 VIEW COMPARISON:  None. FINDINGS: Moderate stool throughout the colon is noted. No dilated bowel loops are noted. No suspicious calcifications are present. No acute bony  abnormalities are identified. IMPRESSION: Moderate colonic stool. No evidence of bowel obstruction. Electronically Signed   By: Harmon Pier M.D.   On: 08/18/2020 14:41   CT HEAD WO CONTRAST ( )  Result Date: 08/30/2020 CLINICAL DATA:  Low back pain and extremity weakness, initial encounter EXAM: CT HEAD WITHOUT CONTRAST TECHNIQUE: Contiguous axial images were obtained from the base of the skull through the vertex without intravenous contrast. COMPARISON:  None. FINDINGS: Brain: No evidence of acute infarction, hemorrhage, hydrocephalus, extra-axial collection or mass lesion/mass effect. Mild atrophic changes are noted. Mild basal ganglia calcifications are seen as well. Vascular: No hyperdense vessel or unexpected calcification. Skull: Normal. Negative for fracture or focal lesion. Sinuses/Orbits: No acute finding. Other: None. IMPRESSION: Chronic atrophic changes without acute abnormality. Electronically Signed   By: Alcide Clever M.D.   On: 08/30/2020 17:29   CT Lumbar Spine Wo Contrast  Result Date: 08/30/2020 CLINICAL DATA:  Low back pain, cauda equina syndrome suspected. Additional history provided: Extremity weakness, motor neuron disease. EXAM: CT LUMBAR SPINE WITHOUT CONTRAST TECHNIQUE: Multidetector CT imaging of the lumbar spine was performed without intravenous contrast administration. Multiplanar CT image reconstructions were also generated. COMPARISON:  None FINDINGS: Segmentation:  5 lumbar vertebrae. The caudal most well-formed intervertebral disc space is designated Alignment: No significant spondylolisthesis. Vertebrae: Vertebral body height is maintained. No evidence of fracture to the lumbar spine. Paraspinal and other soft tissues: Paraspinal soft tissues unremarkable. Aortoiliac atherosclerosis. Disc levels: No more than mild disc space narrowing at any level. T10-T11: Disc bulge. Facet arthrosis and ligamentum flavum calcification. No appreciable significant spinal canal stenosis. The  neural foramina are incompletely included in the field of view. T11-T12: Disc bulge. Facet arthrosis and ligamentum flavum calcification. No appreciable significant spinal canal stenosis. Moderate left neural foraminal narrowing. T12-L1: Disc bulge. Mild facet arthrosis and ligamentum flavum calcification. No appreciable significant spinal canal stenosis or neural foraminal narrowing. L1-L2: Disc bulge. Facet arthrosis with ligamentum flavum hypertrophy. No appreciable significant spinal canal stenosis or neural foraminal narrowing. L2-L3: Disc bulge. Facet arthrosis with ligamentum flavum hypertrophy. Suspected at least moderate spinal canal stenosis. Mild left neural foraminal narrowing. L3-L4: Disc bulge. Facet arthrosis with ligamentum flavum hypertrophy. Suspected at least moderate spinal canal stenosis. Bilateral neural foraminal narrowing (mild right, moderate/severe left). L4-L5: Disc bulge. Facet arthrosis with ligamentum flavum hypertrophy. Suspected at least moderate spinal canal stenosis. Bilateral neural foraminal narrowing (mild right, moderate left). L5-S1: Disc bulge. Facet arthrosis with ligamentum flavum hypertrophy. No appreciable significant spinal canal stenosis or neural foraminal narrowing. IMPRESSION: Lumbar spondylosis, as outlined. Multilevel spinal canal stenosis. Most notably, there is suspected at least moderate spinal canal stenosis at L2-L3, L3-L4 and L4-L5. Given the clinical concern for cauda equina syndrome, an MRI should be considered for further evaluation if the patient is able to have one. Multilevel foraminal stenosis, as detailed and greatest on the left at T11-T12 (moderate), on the left at L3-L4 (moderate/severe) and on the left at L4-L5 (moderate). Aortic Atherosclerosis (ICD10-I70.0). Electronically Signed   By: Jackey LogeKyle  Golden D.O.   On: 08/30/2020 17:37   CT PELVIS W CONTRAST  Result Date: 08/13/2020 CLINICAL DATA:  Rectal pain, hemorrhoids for 3 weeks, concern for  rectal abscess EXAM: CT PELVIS WITH CONTRAST TECHNIQUE: Multidetector CT imaging of the pelvis was performed using the standard protocol following the bolus administration of intravenous contrast. CONTRAST:  75mL OMNIPAQUE IOHEXOL 300 MG/ML  SOLN COMPARISON:  None. FINDINGS: Urinary Tract: Distal ureters and bladder are unremarkable. No urinary tract calculi. Bowel: No bowel obstruction or ileus. No acute inflammatory changes. At the anal verge, there is a linear gas lucency along the anterior margin of the anal rectal junction, which could reflect an anal fissure. No fluid collection or abscess. Vascular/Lymphatic: Atherosclerosis of the distal aorta and its branches. No pathologic adenopathy. Reproductive:  Uterus is surgically absent.  No adnexal masses. Other: No free fluid or free intraperitoneal gas. No abdominal wall hernia. Musculoskeletal: No acute or destructive bony lesions. Reconstructed images demonstrate no additional findings. IMPRESSION: 1. Linear gas lucency arising from the anterior margin of the anal rectal junction, which could reflect an anal fissure. No fluid collection or abscess. Please correlate with physical exam findings. 2.  Aortic Atherosclerosis (ICD10-I70.0). Electronically Signed   By: Sharlet SalinaMichael  Brown M.D.   On: 08/13/2020 19:46   MR Cervical Spine W or Wo Contrast  Result Date: 08/30/2020 CLINICAL DATA:  Demyelinating disease Motor neuron disease Myelopathy, acute or progressive; Demyelinating disease Motor neuron disease Myelopathy, acute or progressive Cord compression please include cauda; Motor neuron disease Myelopathy, acute or progressive EXAM: MRI CERVICAL, THORACIC AND LUMBAR SPINE WITHOUT AND WITH CONTRAST TECHNIQUE: Multiplanar and multiecho pulse sequences of the cervical spine, to include  the craniocervical junction and cervicothoracic junction, and thoracic and lumbar spine, were obtained without and with intravenous contrast. CONTRAST:  7.91mL GADAVIST GADOBUTROL 1  MMOL/ML IV SOLN COMPARISON:  None. FINDINGS: MRI CERVICAL SPINE FINDINGS Alignment: Physiologic. Vertebrae: No fracture, evidence of discitis, or bone lesion. 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 There is hyperintense T2-weighted signal within the dorsal columns of the spinal cord at the C5-6 levels. Posterior Fossa, vertebral arteries, paraspinal tissues: Negative Disc levels: C2-3: Unremarkable. C3-4: Small right subarticular disc protrusion with mild right foraminal stenosis. C4-5: Small disc bulge with uncovertebral hypertrophy. Moderate spinal canal stenosis with flattening of the spinal cord. C5-6: Left asymmetric disc bulge with uncovertebral hypertrophy. Moderate left foraminal stenosis and mild spinal canal stenosis. C6-7: No disc herniation or stenosis. MRI THORACIC SPINE FINDINGS Alignment:  Physiologic. Vertebrae: No fracture, evidence of discitis, or bone lesion. Cord: Hyperintense T2-weighted signal within the dorsal columns within most of the lower thoracic spine, though detailed assessment is limited by motion. Paraspinal and other soft tissues: Negative Disc levels: No spinal canal stenosis. MRI LUMBAR SPINE FINDINGS Segmentation:  Standard. Alignment:  Physiologic. Vertebrae:  No fracture, evidence of discitis, or bone lesion. Conus medullaris and cauda equina: Conus extends to the L2 level. Conus and cauda equina appear normal. Paraspinal and other soft tissues: Negative Disc levels: Small disc bulge at L4-5 with mild narrowing of the lateral recesses. The other lumbar disc levels are unremarkable. No abnormal contrast enhancement. IMPRESSION: 1. 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 Unclear etiology. 2. Hyperintense T2-weighted signal lesions of the dorsal columns of the spinal cord at the C5-6 levels and throughout the lower thoracic spinal  cord, most consistent with subacute combined degeneration (as may be seen in B12 deficiency). 3. Cervical degenerative disc disease with moderate spinal canal stenosis at C4-5 and mild spinal canal stenosis at C5-6. 4. Mild narrowing of the lateral recesses at L4-5. Electronically Signed   By: Deatra Robinson M.D.   On: 08/30/2020 22:07   MR THORACIC SPINE W WO CONTRAST  Result Date: 08/30/2020 CLINICAL DATA:  Demyelinating disease Motor neuron disease Myelopathy, acute or progressive; Demyelinating disease Motor neuron disease Myelopathy, acute or progressive Cord compression please include cauda; Motor neuron disease Myelopathy, acute or progressive EXAM: MRI CERVICAL, THORACIC AND LUMBAR SPINE WITHOUT AND WITH CONTRAST TECHNIQUE: Multiplanar and multiecho pulse sequences of the cervical spine, to include the craniocervical junction and cervicothoracic junction, and thoracic and lumbar spine, were obtained without and with intravenous contrast. CONTRAST:  7.56mL GADAVIST GADOBUTROL 1 MMOL/ML IV SOLN COMPARISON:  None. FINDINGS: MRI CERVICAL SPINE FINDINGS Alignment: Physiologic. Vertebrae: No fracture, evidence of discitis, or bone lesion. 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 There is hyperintense T2-weighted signal within the dorsal columns of the spinal cord at the C5-6 levels. Posterior Fossa, vertebral arteries, paraspinal tissues: Negative Disc levels: C2-3: Unremarkable. C3-4: Small right subarticular disc protrusion with mild right foraminal stenosis. C4-5: Small disc bulge with uncovertebral hypertrophy. Moderate spinal canal stenosis with flattening of the spinal cord. C5-6: Left asymmetric disc bulge with uncovertebral hypertrophy. Moderate left foraminal stenosis and mild spinal canal stenosis. C6-7: No disc herniation or stenosis. MRI THORACIC SPINE FINDINGS Alignment:  Physiologic. Vertebrae: No fracture, evidence of  discitis, or bone lesion. Cord: Hyperintense T2-weighted signal within the dorsal columns within most of the  lower thoracic spine, though detailed assessment is limited by motion. Paraspinal and other soft tissues: Negative Disc levels: No spinal canal stenosis. MRI LUMBAR SPINE FINDINGS Segmentation:  Standard. Alignment:  Physiologic. Vertebrae:  No fracture, evidence of discitis, or bone lesion. Conus medullaris and cauda equina: Conus extends to the L2 level. Conus and cauda equina appear normal. Paraspinal and other soft tissues: Negative Disc levels: Small disc bulge at L4-5 with mild narrowing of the lateral recesses. The other lumbar disc levels are unremarkable. No abnormal contrast enhancement. IMPRESSION: 1. 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 Unclear etiology. 2. Hyperintense T2-weighted signal lesions of the dorsal columns of the spinal cord at the C5-6 levels and throughout the lower thoracic spinal cord, most consistent with subacute combined degeneration (as may be seen in B12 deficiency). 3. Cervical degenerative disc disease with moderate spinal canal stenosis at C4-5 and mild spinal canal stenosis at C5-6. 4. Mild narrowing of the lateral recesses at L4-5. Electronically Signed   By: Deatra Robinson M.D.   On: 08/30/2020 22:07   MR Lumbar Spine W Wo Contrast  Result Date: 08/30/2020 CLINICAL DATA:  Demyelinating disease Motor neuron disease Myelopathy, acute or progressive; Demyelinating disease Motor neuron disease Myelopathy, acute or progressive Cord compression please include cauda; Motor neuron disease Myelopathy, acute or progressive EXAM: MRI CERVICAL, THORACIC AND LUMBAR SPINE WITHOUT AND WITH CONTRAST TECHNIQUE: Multiplanar and multiecho pulse sequences of the cervical spine, to include the craniocervical junction and cervicothoracic junction, and thoracic and lumbar spine, were obtained without and with intravenous  contrast. CONTRAST:  7.35mL GADAVIST GADOBUTROL 1 MMOL/ML IV SOLN COMPARISON:  None. FINDINGS: MRI CERVICAL SPINE FINDINGS Alignment: Physiologic. Vertebrae: No fracture, evidence of discitis, or bone lesion. 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 There is hyperintense T2-weighted signal within the dorsal columns of the spinal cord at the C5-6 levels. Posterior Fossa, vertebral arteries, paraspinal tissues: Negative Disc levels: C2-3: Unremarkable. C3-4: Small right subarticular disc protrusion with mild right foraminal stenosis. C4-5: Small disc bulge with uncovertebral hypertrophy. Moderate spinal canal stenosis with flattening of the spinal cord. C5-6: Left asymmetric disc bulge with uncovertebral hypertrophy. Moderate left foraminal stenosis and mild spinal canal stenosis. C6-7: No disc herniation or stenosis. MRI THORACIC SPINE FINDINGS Alignment:  Physiologic. Vertebrae: No fracture, evidence of discitis, or bone lesion. Cord: Hyperintense T2-weighted signal within the dorsal columns within most of the lower thoracic spine, though detailed assessment is limited by motion. Paraspinal and other soft tissues: Negative Disc levels: No spinal canal stenosis. MRI LUMBAR SPINE FINDINGS Segmentation:  Standard. Alignment:  Physiologic. Vertebrae:  No fracture, evidence of discitis, or bone lesion. Conus medullaris and cauda equina: Conus extends to the L2 level. Conus and cauda equina appear normal. Paraspinal and other soft tissues: Negative Disc levels: Small disc bulge at L4-5 with mild narrowing of the lateral recesses. The other lumbar disc levels are unremarkable. No abnormal contrast enhancement. IMPRESSION: 1. 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 Unclear etiology. 2. Hyperintense T2-weighted signal lesions of the dorsal columns of the spinal cord at the C5-6  levels and throughout the lower thoracic spinal cord, most consistent with subacute combined degeneration (as may be seen in B12 deficiency). 3. Cervical degenerative disc disease with moderate spinal canal stenosis at C4-5 and mild spinal canal stenosis at C5-6. 4. Mild narrowing of the  lateral recesses at L4-5. Electronically Signed   By: Deatra Robinson M.D.   On: 08/30/2020 22:07   US RENAL  Result Date: 09/02/2020 CLINICAL DATA:  Acute kidney injury. EXAM: RENAL / URINARY TRACT ULTRASOUND COMPLETE COMPARISON:  None. FINDINGS: Right Kidney: Renal measurements: 9.9 x 3.3 x 3.4 cm = volume: 57.3 mL. Normal renal parenchymal echogenicity. Cortical thinning suggested of the mid to lower pole. No mass or stone. No hydronephrosis. Left Kidney: Renal measurements: 8.5 x 3.7 x 3.5 cm = volume: 57.9 mL. Normal parenchymal echogenicity. There is cortical thinning along the lower pole consistent with scarring. No mass, stone or hydronephrosis. Bladder: Appears normal for degree of bladder distention. Other: None. IMPRESSION: 1. No acute findings. No hydronephrosis. No renal masses or stones. 2. Bilateral renal cortical thinning most evident along the lower poles, appearance consistent with chronic scarring. Overall mild reduction in bilateral renal sizes. Electronically Signed   By: Amie Portland M.D.   On: 09/02/2020 12:14   (Echo, Carotid, EGD, Colonoscopy, ERCP)    Subjective: Patient seen and examined.  No overnight events.  Still has tingling numbness of the hands and feet. She has hyperpigmentation of the hands, after multiple discussions she will still not let me examine her feet even though she knows we may be missing something very important. Looking forward to go to rehab.   Discharge Exam: Vitals:   09/06/20 0721 09/06/20 1132  BP: 127/64 (!) 142/67  Pulse: 71 80  Resp: 16 18  Temp: 98.7 F (37.1 C) 99.3 F (37.4 C)  SpO2: 100% 100%   Vitals:   09/06/20 0044 09/06/20 0449 09/06/20 0721  09/06/20 1132  BP: (!) 127/59 125/68 127/64 (!) 142/67  Pulse: 64 66 71 80  Resp: 18 18 16 18   Temp: 98.7 F (37.1 C) 98.6 F (37 C) 98.7 F (37.1 C) 99.3 F (37.4 C)  TempSrc: Oral Oral Oral Oral  SpO2: 100% 100% 100% 100%  Weight:      Height:        General: Pt is alert, awake, not in acute distress, on room air. Cardiovascular: RRR, S1/S2 +, no rubs, no gallops Respiratory: CTA bilaterally, no wheezing, no rhonchi Abdominal: Soft, NT, ND, bowel sounds + Extremities:  She has generalized weakness of all distal muscle groups. Patient has hyperpigmentation of the hands and fingers, radial and ulnar pulses are adequate. Patient adamantly refused to remove her socks to examine her ankle pulses and to examine her feet. Popliteal pulses are adequate.    The results of significant diagnostics from this hospitalization (including imaging, microbiology, ancillary and laboratory) are listed below for reference.     Microbiology: Recent Results (from the past 240 hour(s))  Resp Panel by RT-PCR (Flu A&B, Covid) Nasopharyngeal Swab     Status: None   Collection Time: 08/30/20  6:10 PM   Specimen: Nasopharyngeal Swab; Nasopharyngeal(NP) swabs in vial transport medium  Result Value Ref Range Status   SARS Coronavirus 2 by RT PCR NEGATIVE NEGATIVE Final    Comment: (NOTE) SARS-CoV-2 target nucleic acids are NOT DETECTED.  The SARS-CoV-2 RNA is generally detectable in upper respiratory specimens during the acute phase of infection. The lowest concentration of SARS-CoV-2 viral copies this assay can detect is 138 copies/mL. A negative result does not preclude SARS-Cov-2 infection and should not be used as the sole basis for treatment or other patient management decisions. A negative result may occur with  improper specimen collection/handling, submission of specimen other than nasopharyngeal swab, presence  of viral mutation(s) within the areas targeted by this assay, and inadequate  number of viral copies(<138 copies/mL). A negative result must be combined with clinical observations, patient history, and epidemiological information. The expected result is Negative.  Fact Sheet for Patients:  BloggerCourse.com  Fact Sheet for Healthcare Providers:  SeriousBroker.it  This test is no t yet approved or cleared by the [place] FDA and  has been authorized for detection and/or diagnosis of SARS-CoV-2 by FDA under an Emergency Use Authorization (EUA). This EUA will remain  in effect (meaning this test can be used) for the duration of the COVID-19 declaration under Section 564(b)(1) of the Act, 21 U.S.C.section 360bbb-3(b)(1), unless the authorization is terminated  or revoked sooner.       Influenza A by PCR NEGATIVE NEGATIVE Final   Influenza B by PCR NEGATIVE NEGATIVE Final    Comment: (NOTE) The Xpert Xpress SARS-CoV-2/FLU/RSV plus assay is intended as an aid in the diagnosis of influenza from Nasopharyngeal swab specimens and should not be used as a sole basis for treatment. Nasal washings and aspirates are unacceptable for Xpert Xpress SARS-CoV-2/FLU/RSV testing.  Fact Sheet for Patients: BloggerCourse.com  Fact Sheet for Healthcare Providers: SeriousBroker.it  This test is not yet approved or cleared by the [place] FDA and has been authorized for detection and/or diagnosis of SARS-CoV-2 by FDA under an Emergency Use Authorization (EUA). This EUA will remain in effect (meaning this test can be used) for the duration of the COVID-19 declaration under Section 564(b)(1) of the Act, 21 U.S.C. section 360bbb-3(b)(1), unless the authorization is terminated or revoked.  Performed at [facility], [address] [place], [place] [address]      Labs: BNP (last 3 results) No results for input(s): BNP in the last 8760 hours. Basic  Metabolic Panel: Recent Labs  Lab 09/01/20 0208 09/02/20 0200 09/03/20 0136 09/04/20 0334 09/05/20 0326  NA 133* 138 138 140 139  K 4.5 4.2 4.9 5.2* 4.8  CL 102 104 109 113* 111  CO2 21* 26 22 23  21*  GLUCOSE 366* 166* 264* 313* 301*  BUN 27* 43* 50* 44* 45*  CREATININE 1.41* 2.02* 1.68* 1.34* 1.25*  CALCIUM 8.7* 9.4 8.7* 8.6* 8.6*  MG 1.9 2.3 2.2 2.2 2.2   Liver Function Tests: Recent Labs  Lab 08/30/20 1644  AST 25  ALT 31  ALKPHOS 66  BILITOT 1.0  PROT 7.2  ALBUMIN 4.3   No results for input(s): LIPASE, AMYLASE in the last 168 hours. No results for input(s): AMMONIA in the last 168 hours. CBC: Recent Labs  Lab 08/30/20 1644 09/01/20 0208 09/02/20 0200 09/03/20 0232 09/04/20 0334  WBC 3.7* 3.4* 5.2 5.9 5.0  NEUTROABS 2.4  --   --   --   --   HGB 9.7* 8.5* 9.2* 8.4* 7.9*  HCT 28.3* 25.0* 27.5* 25.8* 23.9*  MCV 105.6* 106.4* 107.0* 108.4* 110.1*  PLT 98* 99* 83* 77* 68*   Cardiac Enzymes: No results for input(s): CKTOTAL, CKMB, CKMBINDEX, TROPONINI in the last 168 hours. BNP: Invalid input(s): POCBNP CBG: Recent Labs  Lab 09/05/20 0607 09/05/20 1108 09/05/20 1720 09/06/20 0627 09/06/20 1142  GLUCAP 362* 198* 266* 412* 267*   D-Dimer No results for input(s): DDIMER in the last 72 hours. Hgb A1c No results for input(s): HGBA1C in the last 72 hours. Lipid Profile No results for input(s): CHOL, HDL, LDLCALC, TRIG, CHOLHDL, LDLDIRECT in the last 72 hours. Thyroid function studies No results for input(s): TSH,  T4TOTAL, T3FREE, THYROIDAB in the last 72 hours.  Invalid input(s): FREET3 Anemia work up No results for input(s): VITAMINB12, FOLATE, FERRITIN, TIBC, IRON, RETICCTPCT in the last 72 hours. Urinalysis    Component Value Date/Time   COLORURINE YELLOW 11/19/2014 1836   APPEARANCEUR CLEAR 11/19/2014 1836   LABSPEC 1.030 11/19/2014 1836   PHURINE 7.0 11/19/2014 1836   GLUCOSEU >1000 (A) 11/19/2014 1836   HGBUR NEGATIVE 11/19/2014 1836    BILIRUBINUR NEGATIVE 11/19/2014 1836   KETONESUR NEGATIVE 11/19/2014 1836   PROTEINUR NEGATIVE 11/19/2014 1836   UROBILINOGEN 0.2 11/19/2014 1836   NITRITE NEGATIVE 11/19/2014 1836   LEUKOCYTESUR SMALL (A) 11/19/2014 1836   Sepsis Labs Invalid input(s): PROCALCITONIN,  WBC,  LACTICIDVEN Microbiology Recent Results (from the past 240 hour(s))  Resp Panel by RT-PCR (Flu A&B, Covid) Nasopharyngeal Swab     Status: None   Collection Time: 08/30/20  6:10 PM   Specimen: Nasopharyngeal Swab; Nasopharyngeal(NP) swabs in vial transport medium  Result Value Ref Range Status   SARS Coronavirus 2 by RT PCR NEGATIVE NEGATIVE Final    Comment: (NOTE) SARS-CoV-2 target nucleic acids are NOT DETECTED.  The SARS-CoV-2 RNA is generally detectable in upper respiratory specimens during the acute phase of infection. The lowest concentration of SARS-CoV-2 viral copies this assay can detect is 138 copies/mL. A negative result does not preclude SARS-Cov-2 infection and should not be used as the sole basis for treatment or other patient management decisions. A negative result may occur with  improper specimen collection/handling, submission of specimen other than nasopharyngeal swab, presence of viral mutation(s) within the areas targeted by this assay, and inadequate number of viral copies(<138 copies/mL). A negative result must be combined with clinical observations, patient history, and epidemiological information. The expected result is Negative.  Fact Sheet for Patients:  BloggerCourse.com  Fact Sheet for Healthcare Providers:  SeriousBroker.it  This test is no t yet approved or cleared by the [place] FDA and  has been authorized for detection and/or diagnosis of SARS-CoV-2 by FDA under an Emergency Use Authorization (EUA). This EUA will remain  in effect (meaning this test can be used) for the duration of the COVID-19 declaration under  Section 564(b)(1) of the Act, 21 U.S.C.section 360bbb-3(b)(1), unless the authorization is terminated  or revoked sooner.       Influenza A by PCR NEGATIVE NEGATIVE Final   Influenza B by PCR NEGATIVE NEGATIVE Final    Comment: (NOTE) The Xpert Xpress SARS-CoV-2/FLU/RSV plus assay is intended as an aid in the diagnosis of influenza from Nasopharyngeal swab specimens and should not be used as a sole basis for treatment. Nasal washings and aspirates are unacceptable for Xpert Xpress SARS-CoV-2/FLU/RSV testing.  Fact Sheet for Patients: BloggerCourse.com  Fact Sheet for Healthcare Providers: SeriousBroker.it  This test is not yet approved or cleared by the [place] FDA and has been authorized for detection and/or diagnosis of SARS-CoV-2 by FDA under an Emergency Use Authorization (EUA). This EUA will remain in effect (meaning this test can be used) for the duration of the COVID-19 declaration under Section 564(b)(1) of the Act, 21 U.S.C. section 360bbb-3(b)(1), unless the authorization is terminated or revoked.  Performed at [facility], [address], [place], [place] [address]      Time coordinating discharge:  35 minutes  SIGNED:   Dorcas Carrow, MD  [facility] 09/06/2020, 12:10 PM

## 2020-09-06 NOTE — TOC Transition Note (Signed)
Transition of Care [facility]) - CM/SW Discharge Note   Patient Details  Name: Diane Willis MRN: [id number] Date of Birth: 11/27/1955  [facility] CM/SW Contact:  Kermit Balo, RN Phone Number: 09/06/2020, 1:16 PM   Clinical Narrative:    Patient is discharging to CIR today. CM signing off.    Final next level of care: IP Rehab Facility Barriers to Discharge: No Barriers Identified   Patient Goals and CMS Choice        Discharge Placement                       Discharge Plan and Services                                     Social Determinants of Health (SDOH) Interventions     Readmission Risk Interventions No flowsheet data found.

## 2020-09-06 NOTE — Progress Notes (Signed)
PROGRESS NOTE    Diane Willis  NIO:[id number] DOB: 21-Mar-1955 DOA: 08/30/2020 PCP: Fleet Contras, MD    Brief Narrative:  64-year with history of DM2, HTN, HLD admitted for progressive bilateral lower extremity weakness, numbness and tingling for the past 3 weeks.  Patient underwent extensive work-up including CT head and MRI spine which showed some demyelination of her spine consistent with finding of vitamin B12 deficiency.  Started on B12 supplements.  Neurology team was following.  Also developed acute kidney injury that is improving. Patient does not let us examine her feet even after multiple counseling.  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  Multifactorial acute kidney injury. Creatinine 1.5-1.14-1.41-2.02-1.68-1.34-1.25. Trending down. Renal ultrasound was essentially normal.  Antihypertensives and lisinopril on hold. Keeping off IV fluids. Maintaining on oral intake.   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 Folate normal. Homocysteine over 250. TSH normal. RPR -, HIV NR, copper 143 , Zinc level is 82 Treating with vitamin B12 supplementation. Vitamin B12 1000 mcg daily for 7/7 days Vitamin B12 1000 mcg weekly for 7 weeks then vitamin B12 1000 mcg every month lifelong.  Continue thiamine supplementation.  Transfer to rehab.  Essential hypertension: On lisinopril and hydrochlorothiazide.  On hold due to AKI.  Blood pressures are stable, low normal.  No indication for antihypertensive at this time.  Type 2 diabetes, uncontrolled with hyperglycemia:  Increase 70/30 insulin to 25 units twice daily today.  Keep on sliding scale insulin.     Hyperlipidemia: On statin.  Anxiety disorder: On Xanax and Atarax that she can use.     DVT prophylaxis: SCDs Start: 08/31/20 0547   Code Status: Full code Family Communication: Husband on the phone 8/24. Disposition Plan: Status is: Inpatient  Remains inpatient appropriate because:Unsafe d/c plan  Dispo: The patient is from: Home              Anticipated d/c is to: CIR              Patient currently is medically stable to d/c.  Only to rehab level of care.   Difficult to place patient No         Consultants:  Neurology  Procedures:  None  Antimicrobials:  None   Subjective: Patient seen and examined.  No overnight events.  Still has some tingling of the fingers and legs. Patient tries to downplay her symptoms.  She had slight better mobility yesterday but could not take his steps. Still does not let me examine her feet.  Objective: Vitals:   09/05/20 1920 09/06/20 0044 09/06/20 0449 09/06/20 0721  BP: (!) 117/59 (!) 127/59 125/68 127/64  Pulse: 71 64 66 71  Resp: 16 18 18 16   Temp: 98.6 F (37 C) 98.7 F (37.1 C) 98.6 F (37 C) 98.7 F (37.1 C)  TempSrc: Oral Oral Oral Oral  SpO2: 100% 100% 100% 100%  Weight:      Height:        Intake/Output Summary (Last 24 hours) at 09/06/2020 1100 Last data filed at 09/05/2020 2040 Gross per 24 hour  Intake 480 ml  Output --  Net 480 ml   2041   08/30/20  1555 08/31/20 2112  Weight: 76.2 kg 79.7 kg    Examination: General exam: Appears calm and comfortable  Respiratory system: Clear to auscultation. Respiratory effort normal. Cardiovascular system: S1 & S2 heard, RRR. No JVD, murmurs, rubs, gallops or clicks. No pedal edema. Gastrointestinal system: Abdomen is nondistended, soft and nontender. No organomegaly or masses felt. Normal bowel sounds heard. Central nervous system: Alert and oriented.  No gross neurological deficits. Gait not examined. Adamantly refused feet and ankle exam despite  multiple attempts and knowing that we may miss any major problems. She does have pigmentation changes on her both hands.      Data Reviewed: I have personally reviewed following labs and imaging studies  CBC: Recent Labs  Lab 08/30/20 1644 09/01/20 0208 09/02/20 0200 09/03/20 0232 09/04/20 0334  WBC 3.7* 3.4* 5.2 5.9 5.0  NEUTROABS 2.4  --   --   --   --   HGB 9.7* 8.5* 9.2* 8.4* 7.9*  HCT 28.3* 25.0* 27.5* 25.8* 23.9*  MCV 105.6* 106.4* 107.0* 108.4* 110.1*  PLT 98* 99* 83* 77* 68*   Basic Metabolic Panel: Recent Labs  Lab 09/01/20 0208 09/02/20 0200 09/03/20 0136 09/04/20 0334 09/05/20 0326  NA 133* 138 138 140 139  K 4.5 4.2 4.9 5.2* 4.8  CL 102 104 109 113* 111  CO2 21* 26 22 23  21*  GLUCOSE 366* 166* 264* 313* 301*  BUN 27* 43* 50* 44* 45*  CREATININE 1.41* 2.02* 1.68* 1.34* 1.25*  CALCIUM 8.7* 9.4 8.7* 8.6* 8.6*  MG 1.9 2.3 2.2 2.2 2.2   GFR: Estimated Creatinine Clearance: 44.6 mL/min (A) (by C-G formula based on SCr of 1.25 mg/dL (H)). Liver Function Tests: Recent Labs  Lab 08/30/20 1644  AST 25  ALT 31  ALKPHOS 66  BILITOT 1.0  PROT 7.2  ALBUMIN 4.3   No results for input(s): LIPASE, AMYLASE in the last 168 hours. No results for input(s): AMMONIA in the last 168 hours. Coagulation Profile: No results for input(s): INR, PROTIME in the last 168 hours. Cardiac Enzymes: No results for input(s): CKTOTAL, CKMB, CKMBINDEX, TROPONINI in the last 168 hours. BNP (last 3 results) No results for input(s): PROBNP in the last 8760 hours. HbA1C: No results for input(s): HGBA1C in the last 72 hours. CBG: Recent Labs  Lab 09/04/20 2102 09/05/20 0607 09/05/20 1108 09/05/20 1720 09/06/20 0627  GLUCAP 198* 362* 198* 266* 412*   Lipid Profile: No results for input(s): CHOL, HDL, LDLCALC, TRIG, CHOLHDL, LDLDIRECT in the last 72 hours. Thyroid Function Tests: No results for input(s): TSH, T4TOTAL, FREET4, T3FREE, THYROIDAB in the last 72 hours. Anemia  Panel: No results for input(s): VITAMINB12, FOLATE, FERRITIN, TIBC, IRON, RETICCTPCT in the last 72 hours. Sepsis Labs: No results for input(s): PROCALCITON, LATICACIDVEN in the last 168 hours.  Recent Results (from the past 240 hour(s))  Resp Panel by RT-PCR (Flu A&B, Covid) Nasopharyngeal Swab     Status: None   Collection Time: 08/30/20  6:10 PM   Specimen: Nasopharyngeal Swab; Nasopharyngeal(NP) swabs in vial transport medium  Result Value Ref Range Status   SARS Coronavirus 2 by RT PCR NEGATIVE NEGATIVE Final    Comment: (NOTE) SARS-CoV-2 target nucleic acids are NOT DETECTED.  The SARS-CoV-2 RNA is generally detectable in upper respiratory specimens during the acute phase of infection. The lowest concentration of SARS-CoV-2 viral copies this assay can detect is 138 copies/mL. A negative result does not preclude SARS-Cov-2 infection and should not be used as the sole basis for  treatment or other patient management decisions. A negative result may occur with  improper specimen collection/handling, submission of specimen other than nasopharyngeal swab, presence of viral mutation(s) within the areas targeted by this assay, and inadequate number of viral copies(<138 copies/mL). A negative result must be combined with clinical observations, patient history, and epidemiological information. The expected result is Negative.  Fact Sheet for Patients:  BloggerCourse.com  Fact Sheet for Healthcare Providers:  SeriousBroker.it  This test is no t yet approved or cleared by the [place] FDA and  has been authorized for detection and/or diagnosis of SARS-CoV-2 by FDA under an Emergency Use Authorization (EUA). This EUA will remain  in effect (meaning this test can be used) for the duration of the COVID-19 declaration under Section 564(b)(1) of the Act, 21 U.S.C.section 360bbb-3(b)(1), unless the authorization is terminated  or  revoked sooner.       Influenza A by PCR NEGATIVE NEGATIVE Final   Influenza B by PCR NEGATIVE NEGATIVE Final    Comment: (NOTE) The Xpert Xpress SARS-CoV-2/FLU/RSV plus assay is intended as an aid in the diagnosis of influenza from Nasopharyngeal swab specimens and should not be used as a sole basis for treatment. Nasal washings and aspirates are unacceptable for Xpert Xpress SARS-CoV-2/FLU/RSV testing.  Fact Sheet for Patients: BloggerCourse.com  Fact Sheet for Healthcare Providers: SeriousBroker.it  This test is not yet approved or cleared by the [place] FDA and has been authorized for detection and/or diagnosis of SARS-CoV-2 by FDA under an Emergency Use Authorization (EUA). This EUA will remain in effect (meaning this test can be used) for the duration of the COVID-19 declaration under Section 564(b)(1) of the Act, 21 U.S.C. section 360bbb-3(b)(1), unless the authorization is terminated or revoked.  Performed at [facility], [address], [place], [place] [address]          Radiology Studies: No results found.      Scheduled Meds:  aspirin EC  81 mg Oral Daily   cyanocobalamin  1,000 mcg Intramuscular Daily   Followed by   Melene Muller ON 09/07/2020] cyanocobalamin  1,000 mcg Intramuscular Weekly   insulin aspart  0-5 Units Subcutaneous QHS   insulin aspart  0-9 Units Subcutaneous TID WC   insulin aspart protamine- aspart  25 Units Subcutaneous BID WC   simvastatin  40 mg Oral QHS   thiamine injection  100 mg Intravenous Daily   Or   thiamine  100 mg Oral Daily   Continuous Infusions:     LOS: 6 days    Time spent: 30 minutes    Dorcas Carrow, MD [facility] Pager [phone]

## 2020-09-06 NOTE — Progress Notes (Signed)
Inpatient Rehabilitation Admissions Coordinator   I have insurance approval and CIR bed to admit patient to today. I met with patient at bedside and with her permission spoke with her spouse by phone. They are in agreement to admit. I have notified Dr Barb Merino , acute team and TOC. I will make the arrangements to admit today.   Danne Baxter, RN, MSN Rehab Admissions Coordinator [phone] 09/06/2020 12:03 PM

## 2020-09-06 NOTE — Progress Notes (Signed)
Inpatient Rehabilitation  Patient information reviewed and entered into eRehab system by Caterine Mcmeans M. Murrel Freet, M.A., CCC/SLP, PPS Coordinator.  Information including medical coding, functional ability and quality indicators will be reviewed and updated through discharge.    

## 2020-09-06 NOTE — Progress Notes (Signed)
Inpatient Rehabilitation Medication Review by a Pharmacist  A complete drug regimen review was completed for this patient to identify any potential clinically significant medication issues.  High Risk Drug Classes Is patient taking? Indication by Medication  Antipsychotic Yes Compazine for nausea  Anticoagulant No   Antibiotic No   Opioid No   Antiplatelet Yes Baby aspirin for heart  Hypoglycemics/insulin Yes 70/30 and SSI, DM  Vasoactive Medication No   Chemotherapy No   Other No      Type of Medication Issue Identified Description of Issue Recommendation(s)  Drug Interaction(s) (clinically significant)     Duplicate Therapy     Allergy     No Medication Administration End Date     Incorrect Dose     Additional Drug Therapy Needed     Significant med changes from prior encounter (inform family/care partners about these prior to discharge).    Other       Clinically significant medication issues were identified that warrant physician communication and completion of prescribed/recommended actions by midnight of the next day:  No   Pharmacist comments:  Her PO Januvia and Metformin have not been resumed as taking PTA Resumed Vitamin D She was also taking amlodipine and Zestoretic->currently on hold for hypotension  Time spent performing this drug regimen review (minutes):  20 minutes   Elwin Sleight 09/06/2020 2:52 PM

## 2020-09-06 NOTE — Progress Notes (Signed)
Courtney Heys, MD   Physician  Physical Medicine and Rehabilitation  PMR Pre-admission     Signed  Date of Service:  09/05/2020  3:57 PM       Related encounter: ED to Hosp-Admission (Discharged) from 08/30/2020 in [facility]       Signed      Show:Clear all [x] Written[x] Templated[] Copied  Added by: [x] Cristina Gong, RN[x] Courtney Heys, MD  [] Hover for details                                                                                                                                                                                                                                                                                                                                                                                                                                     PMR Admission Coordinator Pre-Admission Assessment   Patient: Diane Willis is an 65 y.o., female MRN: [id number] DOB: 21-Jan-1955 Height: 5' 1"  (154.9 cm) Weight: 79.7 kg Insurance Information HMO:     PPO: yes     PCP:      IPA:      80/20:      OTHER:  PRIMARY: [place] BCBS of [place]      Policy#: [id number]      Subscriber: pt CM Name: Elige Ko.      Phone#: [phone]     Fax#: [phone] Pre-Cert#: [id number] updates to same on 09/19/20  Employer: retired [employer] Benefits:  Phone #: [phone]     Name: 8/24 Eff. Date: 01/14/2020     Deduct: $1250      Out of Pocket Max: $4890      Life Max: none CIR: 80%      SNF: 80% 100 days Outpatient: $10 to $52 co pay per visit     Co-Pay: visits per medical neccesity Home Health: 80%      Co-Pay: visits per medical neccesity DME: 80%     Co-Pay: 20% Providers: in network  SECONDARY: none   Financial Counselor:       Phone#:    The Engineer, [employer]" for patients  in Inpatient Rehabilitation Facilities with attached "Privacy Act [place] Records" was provided and verbally reviewed with: N/A   Emergency Contact Information Contact Information       Name Relation Home Work Mobile    Norris Spouse [phone]             Current Medical History  Patient Admitting Diagnosis: Subacute combined degeneration of spinal cord   History of Present Illness: 65 year old female with history of type 2 DM on insulin, HTN, HLD who presented on 08/30/2020 with complaints of Lower extremity weakness that began about 3 weeks ago. Started with tingling and numbness amd PCP placed patient on gabapentin. Symptoms progressive.    Workup including CT head and MRI spine which showed some demyelination of her spine consistent with finding of vitamin B12 deficiency. Started on B12 supplements and thiamine supplementation. . Neurology consulted.     Also developed Acute kidney injury that is improving. AKI on CKD stage II with baseline creat 1.1. Felt multifactorial. Renal ultrasound normal. Antihypertensives on hold and lisinopril on hold. Maintain oral intake and Off IV fluid for now.    Type 2 DM, uncontrolled with hyperglycemia. Increased 70/30 insulin and keep on sliding scale insulin.    Anxiety disorder on Xanax and Atarax. Does have fear of falling.   Patient's medical record from [facility]  has been reviewed by the rehabilitation admission coordinator and physician.   Past Medical History      Past Medical History:  Diagnosis Date   COVID-19 04/2012   Diabetes mellitus without complication ([place])     Hypertension        Has the patient had major surgery during 100 days prior to admission? No   Family History   family history includes COPD in her father; HIV in her brother, brother, brother, brother, brother, and sister; Healthy in her mother, sister, and sister.   Current Medications   Current Facility-Administered  Medications:    acetaminophen (TYLENOL) tablet 650 mg, 650 mg, Oral, Q6H PRN, 650 mg at 09/01/20 0113 **OR** acetaminophen (TYLENOL) suppository 650 mg, 650 mg, Rectal, Q6H PRN, Rise Patience, MD   ALPRAZolam Duanne Moron) tablet 0.25 mg, 0.25 mg, Oral, TID PRN, Rise Patience, MD, 0.25 mg at 09/05/20 1053   aspirin EC tablet 81 mg, 81 mg, Oral, Daily, Rise Patience, MD, 81 mg at 09/06/20 1116   [COMPLETED] cyanocobalamin ((VITAMIN B-12)) injection 1,000 mcg, 1,000 mcg, Intramuscular, Daily, 1,000 mcg at 09/06/20 1134 **FOLLOWED BY** [START ON 09/07/2020] cyanocobalamin ((VITAMIN B-12)) injection 1,000 mcg, 1,000 mcg, Intramuscular, Weekly, Donnetta Simpers, MD   docusate sodium (COLACE) capsule 100 mg, 100 mg, Oral, BID PRN, Donnetta Simpers, MD, 100 mg at 08/31/20 0634   hydrALAZINE (APRESOLINE) injection 10 mg, 10 mg, Intravenous, Q4H PRN, Hal Hope,  Doreatha Lew, MD   hydrOXYzine (ATARAX/VISTARIL) tablet 25 mg, 25 mg, Oral, Q6H PRN, Rise Patience, MD   insulin aspart (novoLOG) injection 0-5 Units, 0-5 Units, Subcutaneous, QHS, Ghimire, Kuber, MD   insulin aspart (novoLOG) injection 0-9 Units, 0-9 Units, Subcutaneous, TID WC, Ghimire, Kuber, MD   insulin aspart protamine- aspart (NOVOLOG MIX 70/30) injection 25 Units, 25 Units, Subcutaneous, BID WC, Ghimire, Kuber, MD, 25 Units at 09/06/20 1118   ondansetron (ZOFRAN) injection 4 mg, 4 mg, Intravenous, Q6H PRN, Reubin Milan, MD, 4 mg at 09/03/20 2116   simvastatin (ZOCOR) tablet 40 mg, 40 mg, Oral, QHS, Rise Patience, MD, 40 mg at 09/05/20 2115   thiamine (B-1) injection 100 mg, 100 mg, Intravenous, Daily, 100 mg at 08/31/20 0847 **OR** thiamine tablet 100 mg, 100 mg, Oral, Daily, Donnetta Simpers, MD, 100 mg at 09/06/20 1116   Patients Current Diet:  Diet Order                  Diet - low sodium heart healthy             Diet Carb Modified             Diet heart healthy/carb modified Room service  appropriate? Yes; Fluid consistency: Thin  Diet effective now                       Precautions / Restrictions Precautions Precautions: Fall Precaution Comments: ataxic, sensation of falling with movement, anxious Restrictions Weight Bearing Restrictions: No    Has the patient had 2 or more falls or a fall with injury in the past year? Yes   Prior Activity Level Limited Community (1-2x/wk): Mod I with RW as needed, but increased use over past 3 weeks pta   Prior Functional Level Self Care: Did the patient need help bathing, dressing, using the toilet or eating? Needed some help   Indoor Mobility: Did the patient need assistance with walking from room to room (with or without device)? Needed some help   Stairs: Did the patient need assistance with internal or external stairs (with or without device)? Needed some help   Functional Cognition: Did the patient need help planning regular tasks such as shopping or remembering to take medications? Independent   Patient Information Are you of Hispanic, Latino/a,or Spanish origin?: A. No, not of Hispanic, Latino/a, or Spanish origin What is your race?: B. Black or African American Do you need or want an interpreter to communicate with a doctor or health care staff?: 0. No   Patient's Response To:  Health Literacy and Transportation Is the patient able to respond to health literacy and transportation needs?: Yes Health Literacy - How often do you need to have someone help you when you read instructions, pamphlets, or other written material from your doctor or pharmacy?: Never In the past 12 months, has lack of transportation kept you from medical appointments or from getting medications?: No In the past 12 months, has lack of transportation kept you from meetings, work, or from getting things needed for daily living?: No   Home Assistive Devices / Equipment Home Equipment: Environmental consultant - 4 wheels   Prior Device Use: Indicate devices/aids  used by the patient prior to current illness, exacerbation or injury? Walker   Current Functional Level Cognition   Overall Cognitive Status: Impaired/Different from baseline Orientation Level: Oriented X4 Following Commands: Follows one step commands inconsistently, Follows one step commands with increased time Safety/Judgement: Decreased  awareness of deficits General Comments: slow processing and following commands inconsistently. requires increased time to perform tasks. Decreased awareness into deficits. Very anxious, requiring physical touch from therapist to feel secure.    Extremity Assessment (includes Sensation/Coordination)   Upper Extremity Assessment: RUE deficits/detail, LUE deficits/detail RUE Deficits / Details: Ataxia; digits in extension, mass grasp, pt is able to grasp bed rails, grossly elbow flexion/extension 5/5MMT;shoulder flexion AROm ~170, pt reports pain with further range; decreased fine motor skills, RUE Coordination: decreased fine motor, decreased gross motor LUE Deficits / Details: Ataxia;LUE noted 3rd digit with increased extension, gross grasp; LUE elbow flexion/extension 5/5;full AROM shoulder flexion LUE Coordination: decreased fine motor, decreased gross motor  Lower Extremity Assessment: Defer to PT evaluation RLE Deficits / Details: 5/5 strength; very ataxic RLE Sensation: decreased light touch RLE Coordination: decreased fine motor, decreased gross motor LLE Deficits / Details: 5/5 strength; very ataxic LLE Sensation: decreased light touch LLE Coordination: decreased fine motor, decreased gross motor     ADLs   Overall ADL's : Needs assistance/impaired Grooming: Wash/dry hands, Wash/dry face, Set up, Bed level Grooming Details (indicate cue type and reason): required assistance with toothpaste cap;pt otherwise completed ADL after setup in supported chair position in bed Upper Body Bathing: Set up, Sitting Upper Body Dressing : Moderate assistance,  Bed level Lower Body Dressing: Maximal assistance, Bed level Lower Body Dressing Details (indicate cue type and reason): to don/doff socks Functional mobility during ADLs: Total assistance General ADL Comments: pt tolerated long sitting in bed with BUE support on rails to address fear of falling and anxiety with movement     Mobility   Overal bed mobility: Needs Assistance Bed Mobility: Supine to Sit Supine to sit: Mod assist, HOB elevated Sit to supine: Total assist, +2 for physical assistance General bed mobility comments: HOB elevated, pt able to use bilateral rails to pull trunk into upright seated position and bring LE's over EOB to sit. Bed pad utilized for scooting out fully. +2 for return to supine at end of session due to anxiety.     Transfers   Overall transfer level: Needs assistance Equipment used: 2 person hand held assist Transfer via Lift Equipment: Stedy Transfers: Sit to/from Stand Sit to Stand: +2 physical assistance, Mod assist, Max assist Stand pivot transfers: Total assist, +2 physical assistance, +2 safety/equipment Squat pivot transfers: Total assist, +2 safety/equipment, +2 physical assistance  Lateral/Scoot Transfers: Total assist, +2 physical assistance, +2 safety/equipment General transfer comment: Attempted with Stedy. Pt required block of feet to remain on platform, otherwise sliding forward in preparation to stand. Stood x4 with last stand being the best. Pt with difficulty fully extending hips and knees enough to get Stedy flaps under hips. On last stand, peri-care performed, and pt was able to achieve more upright posture.     Ambulation / Gait / Stairs / Wheelchair Mobility   Ambulation/Gait General Gait Details: Unable to attempt     Posture / Balance Dynamic Sitting Balance Sitting balance - Comments: At EOB - mainly limited by anxiety Balance Overall balance assessment: Needs assistance Sitting-balance support: Bilateral upper extremity supported,  Feet supported Sitting balance-Leahy Scale: Poor Sitting balance - Comments: At EOB - mainly limited by anxiety Postural control: Posterior lean Standing balance support: Bilateral upper extremity supported Standing balance-Leahy Scale: Zero Standing balance comment: +2 required     Special needs/care consideration Hgb A1c 7.8    Previous Home Environment  Living Arrangements: Spouse/significant other Available Help at Discharge: Family, Available 24 hours/day  Type of Home: House Home Layout: One level Home Access: Stairs to enter Entrance Stairs-Rails: None Entrance Stairs-Number of Steps: 4 Bathroom Shower/Tub: Multimedia programmer: Standard Bathroom Accessibility: Yes How Accessible: Accessible via walker   Discharge Living Setting Plans for Discharge Living Setting: Patient's home, Lives with (comment) (spouse) Type of Home at Discharge: House Discharge Home Layout: One level Discharge Home Access: Stairs to enter Entrance Stairs-Rails: None Entrance Stairs-Number of Steps: 4 Discharge Bathroom Shower/Tub: Horticulturist, commercial: Standard Discharge Bathroom Accessibility: Yes How Accessible: Accessible via walker Does the patient have any problems obtaining your medications?: No   Social/Family/Support Systems Patient Roles: Spouse Contact Information: spouse, [name] Anticipated Caregiver: spouse Anticipated Ambulance person Information: see above Ability/Limitations of Caregiver: spouse works 10 pm night shift driving truck for [employer], does have leave time Caregiver Availability: 24/7 (pt's mother in Sports coach and sisters can assistt when spouse works) Discharge Plan Discussed with Primary Caregiver: Yes Is Caregiver In Agreement with Plan?: Yes Does Caregiver/Family have Issues with Lodging/Transportation while Pt is in Rehab?: No   Goals Patient/Family Goal for Rehab: supervision to min asisst with PT and OT/ SPouse aware that this may  be at wheelchair level Expected length of stay: ELOS 2 to 3 weeks Pt/Family Agrees to Admission and willing to participate: Yes Program Orientation Provided & Reviewed with Pt/Caregiver Including Roles  & Responsibilities: Yes   Decrease burden of Care through IP rehab admission: n/a   Possible need for SNF placement upon discharge: not anticipated   Patient Condition: I have reviewed medical records from [facility] , spoken with CM, and patient. I met with patient at the bedside for inpatient rehabilitation assessment.  Patient will benefit from ongoing PT and OT, can actively participate in 3 hours of therapy a day 5 days of the week, and can make measurable gains during the admission.  Patient will also benefit from the coordinated team approach during an Inpatient Acute Rehabilitation admission.  The patient will receive intensive therapy as well as Rehabilitation physician, nursing, social worker, and care management interventions.  Due to bladder management, bowel management, safety, skin/wound care, disease management, medication administration, pain management, and patient education the patient requires 24 hour a day rehabilitation nursing.  The patient is currently mod to max assist with Stedy  with mobility and basic ADLs.  Discharge setting and therapy post discharge at home with home health is anticipated.  Patient has agreed to participate in the Acute Inpatient Rehabilitation Program and will admit today.   Preadmission Screen Completed By:  Cleatrice Burke, 09/06/2020 12:35 PM ______________________________________________________________________   Discussed status with Dr. Dagoberto Ligas on 09/06/2020 at 1235 and received approval for admission today.   Admission Coordinator:  Cleatrice Burke, RN, time 9509 Date 09/06/2020    Assessment/Plan: Diagnosis: Does the need for close, 24 hr/day Medical supervision in concert with the patient's rehab needs make it  unreasonable for this patient to be served in a less intensive setting? Yes Co-Morbidities requiring supervision/potential complications: demyelination/degeneration of Spinal cord due to Vit B12 deficiency- CKD II; AKI; anxiety- severe; DM on insulin; HTN; HLD Due to bladder management, bowel management, safety, skin/wound care, disease management, medication administration, pain management, and patient education, does the patient require 24 hr/day rehab nursing? Yes Does the patient require coordinated care of a physician, rehab nurse, PT, OT, and SLP to address physical and functional deficits in the context of the above medical diagnosis(es)? Yes Addressing deficits in the following areas: balance,  endurance, locomotion, strength, transferring, bowel/bladder control, bathing, dressing, feeding, grooming, toileting, cognition, and psychosocial support Can the patient actively participate in an intensive therapy program of at least 3 hrs of therapy 5 days a week? Yes The potential for patient to make measurable gains while on inpatient rehab is good and fair Anticipated functional outcomes upon discharge from inpatient rehab: min assist PT, min assist OT, modified independent SLP Estimated rehab length of stay to reach the above functional goals is: 2-3 weeks Anticipated discharge destination: Home 10. Overall Rehab/Functional Prognosis: good and fair     MD Signature:           Revision History                               Note Details  Jan Fireman, MD File Time 09/06/2020 12:50 PM  Author Type Physician Status Signed  Last Editor Courtney Heys, MD Service Physical Medicine and [place] # [id number] Admit Date 09/06/2020

## 2020-09-07 ENCOUNTER — Ambulatory Visit (HOSPITAL_COMMUNITY): Payer: BC Managed Care – PPO | Attending: Physical Medicine and Rehabilitation

## 2020-09-07 DIAGNOSIS — R531 Weakness: Secondary | ICD-10-CM | POA: Diagnosis present

## 2020-09-07 DIAGNOSIS — R269 Unspecified abnormalities of gait and mobility: Secondary | ICD-10-CM | POA: Insufficient documentation

## 2020-09-07 LAB — CBC WITH DIFFERENTIAL/PLATELET
Abs Immature Granulocytes: 0.11 10*3/uL — ABNORMAL HIGH (ref 0.00–0.07)
Basophils Absolute: 0 10*3/uL (ref 0.0–0.1)
Basophils Relative: 0 %
Eosinophils Absolute: 0.3 10*3/uL (ref 0.0–0.5)
Eosinophils Relative: 3 %
HCT: 28 % — ABNORMAL LOW (ref 36.0–46.0)
Hemoglobin: 8.9 g/dL — ABNORMAL LOW (ref 12.0–15.0)
Immature Granulocytes: 1 %
Lymphocytes Relative: 24 %
Lymphs Abs: 1.9 10*3/uL (ref 0.7–4.0)
MCH: 35.7 pg — ABNORMAL HIGH (ref 26.0–34.0)
MCHC: 31.8 g/dL (ref 30.0–36.0)
MCV: 112.4 fL — ABNORMAL HIGH (ref 80.0–100.0)
Monocytes Absolute: 1 10*3/uL (ref 0.1–1.0)
Monocytes Relative: 13 %
Neutro Abs: 4.5 10*3/uL (ref 1.7–7.7)
Neutrophils Relative %: 59 %
Platelets: 130 10*3/uL — ABNORMAL LOW (ref 150–400)
RBC: 2.49 MIL/uL — ABNORMAL LOW (ref 3.87–5.11)
RDW: 15.9 % — ABNORMAL HIGH (ref 11.5–15.5)
WBC: 7.8 10*3/uL (ref 4.0–10.5)
nRBC: 0.3 % — ABNORMAL HIGH (ref 0.0–0.2)

## 2020-09-07 LAB — GLUCOSE, CAPILLARY
Glucose-Capillary: 344 mg/dL — ABNORMAL HIGH (ref 70–99)
Glucose-Capillary: 308 mg/dL — ABNORMAL HIGH (ref 70–99)
Glucose-Capillary: 346 mg/dL — ABNORMAL HIGH (ref 70–99)
Glucose-Capillary: 324 mg/dL — ABNORMAL HIGH (ref 70–99)
Glucose-Capillary: 328 mg/dL — ABNORMAL HIGH (ref 70–99)

## 2020-09-07 LAB — COMPREHENSIVE METABOLIC PANEL
ALT: 14 U/L (ref 0–44)
AST: 15 U/L (ref 15–41)
Albumin: 2.9 g/dL — ABNORMAL LOW (ref 3.5–5.0)
Alkaline Phosphatase: 65 U/L (ref 38–126)
Anion gap: 10 (ref 5–15)
BUN: 34 mg/dL — ABNORMAL HIGH (ref 8–23)
CO2: 23 mmol/L (ref 22–32)
Calcium: 9 mg/dL (ref 8.9–10.3)
Chloride: 106 mmol/L (ref 98–111)
Creatinine, Ser: 1.29 mg/dL — ABNORMAL HIGH (ref 0.44–1.00)
GFR, Estimated: 47 mL/min — ABNORMAL LOW (ref >60)
Glucose, Bld: 357 mg/dL — ABNORMAL HIGH (ref 70–99)
Potassium: 4.7 mmol/L (ref 3.5–5.1)
Sodium: 139 mmol/L (ref 135–145)
Total Bilirubin: 0.8 mg/dL (ref 0.3–1.2)
Total Protein: 5.4 g/dL — ABNORMAL LOW (ref 6.5–8.1)

## 2020-09-07 MED ORDER — SORBITOL 70 % SOLN
30.0000 mL | Freq: Once | Status: AC
  Administered 2020-09-07: 30 mL via ORAL
  Filled 2020-09-07: qty 30

## 2020-09-07 NOTE — Progress Notes (Signed)
BLE venous duplex has been completed.  Results can be found under chart review under CV PROC. 09/07/2020 3:15 PM Jayna Mulnix RVT, RDMS

## 2020-09-07 NOTE — Plan of Care (Signed)
  Problem: RH Balance Goal: LTG Patient will maintain dynamic sitting balance (PT) Description: LTG:  Patient will maintain dynamic sitting balance with assistance during mobility activities (PT) Flowsheets (Taken 09/07/2020 1534) LTG: Pt will maintain dynamic sitting balance during mobility activities with:: Supervision/Verbal cueing   Problem: Sit to Stand Goal: LTG:  Patient will perform sit to stand with assistance level (PT) Description: LTG:  Patient will perform sit to stand with assistance level (PT) Flowsheets (Taken 09/07/2020 1534) LTG: PT will perform sit to stand in preparation for functional mobility with assistance level: (LRAD) Minimal Assistance - Patient > 75%   Problem: RH Bed Mobility Goal: LTG Patient will perform bed mobility with assist (PT) Description: LTG: Patient will perform bed mobility with assistance, with/without cues (PT). Flowsheets (Taken 09/07/2020 1534) LTG: Pt will perform bed mobility with assistance level of: Independent with assistive device    Problem: RH Bed to Chair Transfers Goal: LTG Patient will perform bed/chair transfers w/assist (PT) Description: LTG: Patient will perform bed to chair transfers with assistance (PT). Flowsheets (Taken 09/07/2020 1534) LTG: Pt will perform Bed to Chair Transfers with assistance level: (LRAD) Minimal Assistance - Patient > 75%   Problem: RH Car Transfers Goal: LTG Patient will perform car transfers with assist (PT) Description: LTG: Patient will perform car transfers with assistance (PT). Flowsheets (Taken 09/07/2020 1534) LTG: Pt will perform car transfers with assist:: (LRAD) Minimal Assistance - Patient > 75%   Problem: RH Ambulation Goal: LTG Patient will ambulate in home environment (PT) Description: LTG: Patient will ambulate in home environment, # of feet with assistance (PT). Flowsheets (Taken 09/07/2020 1534) LTG: Pt will ambulate in home environ  assist needed:: (LRAD) Minimal Assistance - Patient >  75% LTG: Ambulation distance in home environment: 20'   Problem: RH Wheelchair Mobility Goal: LTG Patient will propel w/c in controlled environment (PT) Description: LTG: Patient will propel wheelchair in controlled environment, # of feet with assist (PT) Flowsheets (Taken 09/07/2020 1534) LTG: Pt will propel w/c in controlled environ  assist needed:: Supervision/Verbal cueing LTG: Propel w/c distance in controlled environment: 150' Goal: LTG Patient will propel w/c in home environment (PT) Description: LTG: Patient will propel wheelchair in home environment, # of feet with assistance (PT). Flowsheets (Taken 09/07/2020 1534) LTG: Pt will propel w/c in home environ  assist needed:: Supervision/Verbal cueing Distance: wheelchair distance in controlled environment: 50 LTG: Propel w/c distance in home environment: 50'   Problem: RH Stairs Goal: LTG Patient will ambulate up and down stairs w/assist (PT) Description: LTG: Patient will ambulate up and down # of stairs with assistance (PT) Flowsheets (Taken 09/07/2020 1534) LTG: Pt will ambulate up/down stairs assist needed:: (W/LRAD) Moderate Assistance - Patient 50 - 74% LTG: Pt will  ambulate up and down number of stairs: 4

## 2020-09-07 NOTE — Progress Notes (Signed)
Inpatient Rehabilitation Care Coordinator Assessment and Plan Patient Details  Name: Diane Willis MRN: [id number] Date of Birth: May 02, 1955  Today's Date: 09/07/2020  Hospital Problems: Principal Problem:   Subacute combined degeneration of spinal cord [facility]) Active Problems:   Controlled type 2 diabetes mellitus with hyperglycemia [facility])  Past Medical History:  Past Medical History:  Diagnosis Date   COVID-19 04/2019   Diabetes mellitus without complication (HCC)    Hypertension    Pulmonary emboli (HCC)--presumed post Covid    Past Surgical History:  Past Surgical History:  Procedure Laterality Date   ABDOMINAL HYSTERECTOMY     Social History:  reports that she has never smoked. She has never used smokeless tobacco. She reports that she does not drink alcohol and does not use drugs.  Family / Support Systems Marital Status: Married Patient Roles: Spouse, Parent Spouse/Significant OtherAlinda Money [phone] Children: Three grown children who are local and involved Other Supports: Mother in-law, sister and extended family Anticipated Caregiver: Husband, along with sister and mother in-law Ability/Limitations of Caregiver: Husband works at [employer] truck but can take a leave another option is mother in-law and/or sister will be there while he works Engineer, structural Availability: 24/7 Family Dynamics: Close knit family who are there for one another, she is very grateful for them and would do the same for them. She feels she has strong supports.  Social History Preferred language: English Religion:  Cultural Background: No issues Education: HS Health Literacy - How often do you need to have someone help you when you read instructions, pamphlets, or other written material from your doctor or pharmacy?: Never Writes: Yes Employment Status: Retired Date Retired/Disabled/Unemployed: Investment banker, corporate Issues: No issues Guardian/Conservator:  None-according to MD pt is capable of making her own decisions while here. Husband and other family members will be here daily to provide support to pt   Abuse/Neglect Abuse/Neglect Assessment Can Be Completed: Yes Physical Abuse: Denies Verbal Abuse: Denies Sexual Abuse: Denies Exploitation of patient/patient's resources: Denies Self-Neglect: Denies Possible abuse reported to:: Other (Comment)  Patient response to: Social Isolation - How often do you feel lonely or isolated from those around you?: Never  Emotional Status Pt's affect, behavior and adjustment status: Pt is motivated to do well here and is feeling more in her feet which is encouraging. She has always been independent and gald to finally know what is going on with her back. She is ready to work and recover from this. She is trying to be realistic at the same time. Recent Psychosocial Issues: other health issues-gradual decline in function over past few months Psychiatric History: No history deferred depression screening due to adjusting to rehab and new staff. She would benefit from seeing neuro-psych if able due to her condition cause her anxiety and this is a role she is not used too. Substance Abuse History: No issues  Patient / Family Perceptions, Expectations & Goals Pt/Family understanding of illness & functional limitations: Pt and sister can explain her condition and the treatment plan for her while here. She is one who wants to be informed and kept update don her medical condition. This helps her cope with everything going on. Premorbid pt/family roles/activities: wife, Mother, sibling, in-law, friend, church member, etc Anticipated changes in roles/activities/participation: resume Pt/family expectations/goals: Pt states: " I hope to be as independent as possible before I leave here, but if I need care I will manage."  Sister states: " We hope she does well but will  do what is needed for her as she would for  [place]."  [employer]: None Premorbid Home Care/DME Agencies: Other (Comment) (rollator) Transportation available at discharge: Family can provide Is the patient able to respond to transportation needs?: Yes In the past 12 months, has lack of transportation kept you from medical appointments or from getting medications?: No In the past 12 months, has lack of transportation kept you from meetings, work, or from getting things needed for daily living?: No Resource referrals recommended: Neuropsychology  Discharge Planning Living Arrangements: Spouse/significant other Support Systems: Spouse/significant other, Children, Other relatives, Friends/neighbors, Psychologist, clinical community Type of Residence: Private residence Insurance Resources: Media planner (specify) Chief Executive Officer) Financial Resources: Family Support, Social Security Financial Screen Referred: No Living Expenses: Own Money Management: Patient, Spouse Does the patient have any problems obtaining your medications?: No Home Management: Pt did prior to her decline in function Patient/Family Preliminary Plans: Return home with husband who does work in the evenings, but their are other family members who can be there with her while he is working. He may also take a leave to be there with her, both have not decided yet on plan. Will await therapy goals Care Coordinator Barriers to Discharge: Insurance for SNF coverage Care Coordinator Anticipated Follow Up Needs: HH/OP  Clinical Impression Very pleasant female who is motivated to recover and do well here. She has strong family supports and they can provide 24/7 care if needed. Made aware tihs worker is covering for [place] for today but on Monday she would return. Would benefit from seeing neuro-psych while here if able  Lucy Chris 09/07/2020, 12:38 PM

## 2020-09-07 NOTE — Evaluation (Signed)
Occupational Therapy Assessment and Plan  Patient Details  Name: Diane Willis MRN: [id number] Date of Birth: 09-Jul-1955  OT Diagnosis: abnormal posture, acute pain, ataxia, muscle weakness (generalized), paraplegia, and swelling of limb Rehab Potential:   ELOS: 3-4 weeks   Today's Date: 09/07/2020 OT Individual Time: 1315-1420 OT Individual Time Calculation (min): 65 min     Hospital Problem: Principal Problem:   Subacute combined degeneration of spinal cord ([place]) Active Problems:   Controlled type 2 diabetes mellitus with hyperglycemia ([place])   Past Medical History:  Past Medical History:  Diagnosis Date   COVID-19 04/2019   Diabetes mellitus without complication ([address])    Hypertension    Pulmonary emboli (HCC)--presumed post Covid    Past Surgical History:  Past Surgical History:  Procedure Laterality Date   ABDOMINAL HYSTERECTOMY      Assessment & Plan Clinical Impression: 65 y/o female presented to ED on 8/18 with bilateral LE numbness and difficulty walking x 3 weeks. 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 Suspect due to vitamin B 12 deficiency. PMH: COVID, DM type 2, HTN  Patient currently requires max with basic self-care skills secondary to muscle weakness, decreased cardiorespiratoy endurance, impaired timing and sequencing, abnormal tone, unbalanced muscle activation, ataxia, and decreased coordination, and decreased sitting balance, decreased standing balance, decreased postural control, and decreased balance strategies.  Prior to hospitalization, patient could complete BADL/IADL with independent .  Patient will benefit from skilled intervention to decrease level of assist with basic self-care skills and increase independence with basic self-care skills prior to discharge home with  care partner.  Anticipate patient will require 24 hour supervision and minimal physical assistance and follow up home health.  OT - End of Session Activity Tolerance: Tolerates 30+ min activity with multiple rests Endurance Deficit: Yes Endurance Deficit Description: Mostly limited by anxiety, requires heavy cues for breathing OT Assessment OT Barriers to Discharge: Incontinence;Inaccessible home environment;Decreased caregiver support;Other (comments) (anxiety, ?sking integrity on B feet d/t sensory deficits/refusal to allow skin assessment) OT Patient demonstrates impairments in the following area(s): Balance;Endurance;Motor;Pain;Safety;Sensory;Skin Integrity OT Basic ADL's Functional Problem(s): Grooming;Bathing;Dressing;Toileting OT Transfers Functional Problem(s): Toilet;Tub/Shower OT Plan OT Intensity: Minimum of 1-2 x/day, 45 to 90 minutes OT Frequency: 5 out of 7 days OT Duration/Estimated Length of Stay: 3-4 weeks OT Treatment/Interventions: Balance/vestibular training;Discharge planning;Pain management;Therapeutic Activities;Self Care/advanced ADL retraining;UE/LE Coordination activities;Therapeutic Exercise;Skin care/wound managment;Patient/family education;Functional mobility training;Disease mangement/prevention;Community reintegration;DME/adaptive equipment instruction;Neuromuscular re-education;Psychosocial support;Splinting/orthotics;UE/LE Strength taining/ROM;Wheelchair propulsion/positioning OT Self Feeding Anticipated Outcome(s): no goal OT Basic Self-Care Anticipated Outcome(s): S UB, MIN A LB OT Toileting Anticipated Outcome(s): MIN A OT Bathroom Transfers Anticipated Outcome(s): MIN A OT Recommendation Patient destination: Home Follow Up Recommendations: Home health OT Equipment Recommended: To be determined;Tub/shower seat;3 in 1 bedside comode   OT Evaluation Precautions/Restrictions    Fall, increased anxiety General   Vital Signs  Pain   No pain Home  Living/Prior Functioning Home Living Family/patient expects to be discharged to:: Private residence Living Arrangements: Spouse/significant other Available Help at Discharge: Family, Available 24 hours/day (Husband works, other family available throughout day) Type of Home: House Home Access: Stairs to enter Technical brewer of Steps: 4 Entrance Stairs-Rails: None Home Layout: One level Bathroom Shower/Tub: Multimedia programmer: Standard Bathroom Accessibility: Yes  Lives With: Spouse Prior Function Level of Independence: Independent with basic ADLs, Independent with transfers, Independent with gait  Able to Take Stairs?: Yes Driving: Yes Vocation: Retired Comments: Was using 4WRW at times for mobility, increasing use over the past 3 weeks, not her baseline mobility Vision   Wears reading glasses Vision screen WNL Perception    WFL Praxis Praxis: Intact Cognition Overall Cognitive Status: Within Functional Limits for tasks assessed Arousal/Alertness: Awake/alert Year: 2022 Month: August Day of Week: Correct Awareness: Appears intact Safety/Judgment: Appears intact Sensation Sensation Light Touch: Impaired by gross assessment Coordination Gross Motor Movements are Fluid and Coordinated: No Coordination and Movement Description: fear-avoidance behavior and high anxiety, very slow movement Finger Nose Finger Test: Unable to assess 2/2 anxiety Heel Shin Test: Unable to assess Poor coordination/fumbling with toothpaste cap Motor  Motor Motor: Abnormal postural alignment and control;Paraplegia Motor - Skilled Clinical Observations: Posterior lean, BLE weakness  BLE ataxia/spasms R>L Trunk/Postural Assessment  Cervical Assessment Cervical Assessment: Exceptions to [facility] (Forward head, maintains cervical flexion) Thoracic Assessment Thoracic Assessment: Exceptions to [facility] (Rounded shoulders) Lumbar Assessment Lumbar Assessment: Exceptions to [facility] (posterior  pelvic tilt) Postural Control Postural Control: Deficits on evaluation Trunk Control: Posterior lean, unable to support w/BUE support Righting Reactions: Impaired 2/2 fear-avoidance Protective Responses: Impaired 2/2 fear-avoidance  Balance Balance Balance Assessed: Yes Static Sitting Balance Static Sitting - Balance Support: Feet supported;Bilateral upper extremity supported Static Sitting - Level of Assistance: 4: Min assist Dynamic Sitting Balance Dynamic Sitting - Balance Support: Feet supported;Bilateral upper extremity supported Dynamic Sitting - Level of Assistance: 3: Mod assist Sitting balance - Comments: At EOB - mainly limited by anxiety Extremity/Trunk Assessment   Generlized weakness and poor coordination  BUE    Care Tool Care Tool Self Care Eating        Oral Care     MIN A     Bathing    MAX A for thoroughness, STS In stedy MOD A         Upper Body Dressing(including orthotics)    MOD A        Lower Body Dressing (excluding footwear)    Total A STS in stedy      Putting on/Taking off footwear    Pt refused and will not doff socks despite education on impacts of edema on skin         Care Tool Toileting Toileting activity  MAX simulated        Care Tool Bed Mobility Roll left and right activity        Sit to lying activity        Lying to sitting on side of bed activity         Care Tool Transfers Sit to stand transfer   Sit to stand assist level: Minimal Assistance - Patient > 75%    Chair/bed transfer   Chair/bed transfer assist level: Dependent - mechanical lift     Toilet transfer    Charlaine Dalton- dependent     Care Tool Cognition  Expression of Ideas and Wants    Understanding Verbal and Non-Verbal Content     Memory/Recall Ability     Refer to Care Plan for Long Term Goals  SHORT TERM GOAL WEEK 1 OT Short Term Goal 1 (Week 1): Pt will sit EOB/EOM for 5 min with no more than MIN A dynamically OT Short Term Goal 2 (Week  1): pt will weight shift forward in mod ranges outside BOS wiht min VC for breathing to demo improved coping strategies OT Short Term Goal 3 (Week 1): Pt will don shirt with S  OT Short Term Goal 4 (Week 1): Pt will sit to stand iwht LRAD and MAX A of 1 in prep for BADL  Recommendations for other services: Therapeutic Recreation  Stress management   Skilled Therapeutic Intervention 1:1. Pt educated on OT role/purpose, CIR, ELOS, and SCI recovery. Pt agreeable to BADL tasks except continues to refuse to doff socks despite education about skin check, edema management, socks cutting into edematous BLE, and foot hygiene. Pt requires mod-MAX A for STS In stedy and min-MOD A for sitting balance d/t poor anterior weight shift. Pt demo ataxia/B extensor spasms (RLE>LLE) impacting safety seated in w/c and EOB. See below for w/c at sink level ADL. Exited session with pt seated in bed, exit alarm on and call light in reach  ADL ADL Upper Body Bathing: Supervision/safety Where Assessed-Upper Body Bathing: Wheelchair Lower Body Bathing: Maximal assistance Where Assessed-Lower Body Bathing: Standing at sink;Sitting at sink (stedy) Upper Body Dressing: Minimal assistance Where Assessed-Upper Body Dressing: Wheelchair Lower Body Dressing: Maximal assistance Where Assessed-Lower Body Dressing: Sitting at sink;Standing at sink (stedy) Toileting: Maximal assistance Where Assessed-Toileting: Bedside Commode Toilet Transfer: Dependent (stedy- MIN-MOD A to power up) Mobility  Bed Mobility Bed Mobility: Rolling Right;Rolling Left;Supine to Sit;Scooting to [facility];Sitting - Scoot to Edge of Bed;Sit to Supine Rolling Right: Minimal Assistance - Patient > 75% (Bedrail) Rolling Left: Minimal Assistance - Patient > 75% (Bedrail) Supine to Sit: Moderate Assistance - Patient 50-74% Sitting - Scoot to Edge of Bed: Moderate Assistance - Patient 50-74% Sit to Supine: Moderate Assistance - Patient 50-74% Scooting to [facility]:  Minimal Assistance - Patient > 75%   Discharge Criteria: Patient will be discharged from OT if patient refuses treatment 3 consecutive times without medical reason, if treatment goals not met, if there is a change in medical status, if patient makes no progress towards goals or if patient is discharged from hospital.  The above assessment, treatment plan, treatment alternatives and goals were discussed and mutually agreed upon: by patient  Tonny Branch 09/07/2020, 1:18 PM

## 2020-09-07 NOTE — Progress Notes (Signed)
Inpatient Rehabilitation Center Individual Statement of Services  Patient Name:  Diane Willis  Date:  09/07/2020  Welcome to the Inpatient Rehabilitation Center.  Our goal is to provide you with an individualized program based on your diagnosis and situation, designed to meet your specific needs.  With this comprehensive rehabilitation program, you will be expected to participate in at least 3 hours of rehabilitation therapies Monday-Friday, with modified therapy programming on the weekends.  Your rehabilitation program will include the following services:  Physical Therapy (PT), Occupational Therapy (OT), 24 hour per day rehabilitation nursing, Therapeutic Recreaction (TR), Neuropsychology, Care Coordinator, Rehabilitation Medicine, Nutrition Services, and Pharmacy Services  Weekly team conferences will be held on Tuesday to discuss your progress.  Your Inpatient Rehabilitation Care Coordinator will talk with you frequently to get your input and to update you on team discussions.  Team conferences with you and your family in attendance may also be held.  Expected length of stay: 15-20 days  Overall anticipated outcome: supervision-min assist level  Depending on your progress and recovery, your program may change. Your Inpatient Rehabilitation Care Coordinator will coordinate services and will keep you informed of any changes. Your Inpatient Rehabilitation Care Coordinator's name and contact numbers are listed  below.  The following services may also be recommended but are not provided by the Inpatient Rehabilitation Center:  Driving Evaluations Home Health Rehabiltiation Services Outpatient Rehabilitation Services    Arrangements will be made to provide these services after discharge if needed.  Arrangements include referral to agencies that provide these services.  Your insurance has been verified to be:  [employer] Your primary doctor is:  Fleet Contras  Pertinent information will be  shared with your doctor and your insurance company.  Inpatient Rehabilitation Care Coordinator:  Susie Cassette [phone] or Salena [phone]  Information discussed with and copy given to patient by: Lucy Chris, 09/07/2020, 12:39 PM

## 2020-09-07 NOTE — Evaluation (Signed)
Physical Therapy Assessment and Plan  Patient Details  Name: Diane Willis MRN: [id number] Date of Birth: December 13, 1955  PT Diagnosis: Abnormal posture, Abnormality of gait, Difficulty walking, Impaired sensation, Muscle weakness, and Paraplegia Rehab Potential: Good ELOS: 3-4 weeks   Today's Date: 09/07/2020 PT Individual Time: 0800-0917 PT Individual Time Calculation (min): 80 min    Hospital Problem: Principal Problem:   Subacute combined degeneration of spinal cord ([place]) Active Problems:   Controlled type 2 diabetes mellitus with hyperglycemia ([place])   Past Medical History:  Past Medical History:  Diagnosis Date   COVID-19 04/2019   Diabetes mellitus without complication ([place])    Hypertension    Pulmonary emboli ([facility])--presumed post Covid    Past Surgical History:  Past Surgical History:  Procedure Laterality Date   ABDOMINAL HYSTERECTOMY      Assessment & Plan Clinical Impression: Patient is a 65 y.o. year old female with history of T2DM- A1c of 6.7, HTN, anxiety d/o, Covid 19  complicated by ARDS/PE [id number] who was admitted on 08/30/20 with 3 week history of progressive BLE weakness with tingling bilateral fee and inability to walk.  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  Neurology consulted for input and patient noted to have sensory and proprioceptive deficits bilateral feet BLE and finger tips concerning for large fiber neuropathy and concerning for B12 deficiency. She has refused to remove socks for exam of feet.  Labs revealed Vitamin B12 and thiamine deficiency with elevated MMA and homocysteine levels. Zinc and copper WNL.  She was started on IM B12 supplement with improvement in symptoms. Neurology recommends life long vitamin B 12 supplement after loading dose for dorsal column syndrome.    Patient currently requires  mod A w/bed mobility and 2 helpers w/sliding board   secondary to muscle weakness, impaired timing and sequencing, unbalanced muscle activation, and decreased coordination, decreased awareness and anxiety, and decreased postural control and decreased balance strategies.  Prior to hospitalization, patient was modified independent  with mobility and lived with Spouse in a House home.  Home access is 4Stairs to enter.  Patient will benefit from skilled PT intervention to maximize safe functional mobility, minimize fall risk, and decrease caregiver burden for planned discharge home with 24 hour assist.  Anticipate patient will benefit from follow up [facility] at discharge.  PT - End of Session Activity Tolerance: Tolerates 30+ min activity with multiple rests Endurance Deficit: Yes Endurance Deficit Description: Mostly limited by anxiety, requires heavy cues for breathing PT Assessment Rehab Potential (ACUTE/IP ONLY): Good PT Barriers to Discharge: Home environment access/layout;Behavior PT Barriers to Discharge Comments: Very anxious, fear-avoidance PT Patient demonstrates impairments in the following area(s): Balance;Behavior;Endurance;Motor;Safety;Sensory PT Transfers Functional Problem(s): Bed Mobility;Bed to Chair;Car;Furniture PT Locomotion Functional Problem(s): Ambulation;Wheelchair Mobility;Stairs PT Plan PT Intensity: Minimum of 1-2 x/day ,45 to 90 minutes PT Frequency: 5 out of 7 days PT Duration Estimated Length of Stay: 15-20 days PT Treatment/Interventions: Ambulation/gait training;Community reintegration;DME/adaptive equipment instruction;Neuromuscular re-education;Psychosocial support;Stair training;UE/LE Strength taining/ROM;Wheelchair propulsion/positioning;Balance/vestibular training;Discharge planning;Functional electrical stimulation;Pain management;Therapeutic Activities;UE/LE Coordination activities;Cognitive remediation/compensation;Disease  management/prevention;Functional mobility training;Patient/family education;Splinting/orthotics;Therapeutic Exercise;Visual/perceptual remediation/compensation PT Transfers Anticipated Outcome(s): Supervision PT Locomotion Anticipated Outcome(s): Min A PT Recommendation Recommendations for Other Services: Neuropsych consult;Therapeutic Recreation consult Therapeutic Recreation Interventions: Stress management;Pet therapy Follow Up Recommendations: Home health PT Patient destination: Home Equipment Recommended: To be determined   PT Evaluation  Precautions/Restrictions Precautions Precautions: Fall Precaution Comments: ataxic, sensation of falling with movement, anxious Restrictions Weight Bearing Restrictions: No Pain Interference Pain Interference Pain Effect on Sleep: 1. Rarely or not at all Pain Interference with Therapy Activities: 1. Rarely or not at all Pain Interference with Day-to-Day Activities: 1. Rarely or not at all Home Living/Prior [address] Available Help at Discharge: Family;Available 24 hours/day (Husband works, other family available throughout day) Type of Home: House Home Access: Stairs to enter Technical brewer of Steps: 4 Entrance Stairs-Rails: None Home Layout: One level Bathroom Shower/Tub: Multimedia programmer: Standard Bathroom Accessibility: Yes  Lives With: Spouse Prior Function Level of Independence: Independent with basic ADLs;Independent with transfers;Independent with gait  Able to Take Stairs?: Yes Driving: Yes Vocation: Retired Comments: Was using 4WRW at times for mobility, increasing use over the past 3 weeks, not her baseline mobility Vision/Perception  Perception Perception: Within Functional Limits Praxis Praxis: Intact  Cognition Overall Cognitive Status: Within Functional Limits for tasks assessed Arousal/Alertness: Awake/alert Orientation Level: Oriented X4 Year: 2022 Month: August Day of Week:  Correct Awareness: Appears intact Safety/Judgment: Appears intact Sensation Sensation Light Touch: Impaired by gross assessment Coordination Gross Motor Movements are Fluid and Coordinated: No Coordination and Movement Description: fear-avoidance behavior and high anxiety, very slow movement Finger Nose Finger Test: Unable to assess 2/2 anxiety Heel Shin Test: Unable to assess Motor  Motor Motor: Abnormal postural alignment and control;Paraplegia Motor - Skilled Clinical Observations: Posterior lean, BLE weakness   Trunk/Postural Assessment  Cervical Assessment Cervical Assessment: Exceptions to [facility] (Forward head, maintains cervical flexion) Thoracic Assessment Thoracic Assessment: Exceptions to [facility] (Rounded shoulders) Lumbar Assessment Lumbar Assessment: Exceptions to [facility] (posterior pelvic tilt) Postural Control Postural Control: Deficits on evaluation Trunk Control: Posterior lean, unable to support w/BUE support Righting Reactions: Impaired 2/2 fear-avoidance Protective Responses: Impaired 2/2 fear-avoidance  Balance Balance Balance Assessed: Yes Static Sitting Balance Static Sitting - Balance Support: Feet supported;Bilateral upper extremity supported Static Sitting - Level of Assistance: 4: Min assist Dynamic Sitting Balance Dynamic Sitting - Balance Support: Feet supported;Bilateral upper extremity supported Dynamic Sitting - Level of Assistance: 3: Mod assist Sitting balance - Comments: At EOB - mainly limited by anxiety Extremity Assessment  RLE Assessment RLE Assessment: Not tested (Unable to assess at EOB or in WC 2/2 anxiety) General Strength Comments: At least 3/5 LLE Assessment LLE Assessment: Not tested (Unable to assess at EOB or in [facility] 2/2 anxiety) General Strength Comments: At least 3/5  Care Tool Care Tool Bed Mobility Roll left and right activity   Roll left and right assist level: Minimal Assistance - Patient > 75% (Bedrails)    Sit to lying  activity   Sit to lying assist level: Moderate Assistance - Patient 50 - 74% (Trunk support, high anxiety)    Lying to sitting on side of bed activity   Lying to sitting on side of bed assist level: the ability to move from lying on the back to sitting on the side of the bed with no back support.: Moderate Assistance - Patient 50 - 74% (Trunk support, high anxiety)     Care Tool Transfers Sit to stand transfer Sit to stand activity did not occur: Safety/medical concerns (High anxiety, BLE weakness, SCI)      Chair/bed transfer   Chair/bed transfer assist level: 2 Helpers Cabin crew transfer, high anxiety)     Psychologist, counselling transfer activity did not occur: Safety/medical concerns (Anxiety, BLE  weakness, SCI)        Care Tool Locomotion Ambulation Ambulation activity did not occur: Safety/medical concerns (Anxiety, BLE weakness, SCI)        Walk 10 feet activity Walk 10 feet activity did not occur: Safety/medical concerns (Anxiety, BLE weakness, SCI)       Walk 50 feet with 2 turns activity Walk 50 feet with 2 turns activity did not occur: Safety/medical concerns (Anxiety, BLE weakness, SCI)      Walk 150 feet activity Walk 150 feet activity did not occur: Safety/medical concerns (Anxiety, BLE weakness, SCI)      Walk 10 feet on uneven surfaces activity Walk 10 feet on uneven surfaces activity did not occur: Safety/medical concerns (Anxiety, BLE weakness, SCI)      Stairs Stair activity did not occur: Safety/medical concerns (Anxiety, BLE weakness, SCI)        Walk up/down 1 step activity Walk up/down 1 step or curb (drop down) activity did not occur: Safety/medical concerns (Anxiety, BLE weakness, SCI)     Walk up/down 4 steps activity did not occuR: Safety/medical concerns (Anxiety, BLE weakness, SCI)  Walk up/down 4 steps activity      Walk up/down 12 steps activity Walk up/down 12 steps activity did not occur: Safety/medical concerns (Anxiety,  BLE weakness, SCI)      Pick up small objects from floor Pick up small object from the floor (from standing position) activity did not occur: Safety/medical concerns (Anxiety, BLE weakness, SCI)      Wheelchair Is the patient using a wheelchair?: Yes Type of Wheelchair: Manual Wheelchair activity did not occur: Safety/medical concerns (High anxiety, fear-avoidance behavior)      Wheel 50 feet with 2 turns activity Wheelchair 50 feet with 2 turns activity did not occur: Safety/medical concerns (High anxiety, fear-avoidance behavior)    Wheel 150 feet activity Wheelchair 150 feet activity did not occur: Safety/medical concerns (High anxiety, fear-avoidance behavior)      Refer to Care Plan for Long Term Goals  SHORT TERM GOAL WEEK 1 PT Short Term Goal 1 (Week 1): Pt will perform sit <>supine w/min A PT Short Term Goal 2 (Week 1): Pt will perform sit <>stand w/mod A and LRAD PT Short Term Goal 3 (Week 1): Pt will perform bed <>chair transfers w/LRAD and mod A  Recommendations for other services: Neuropsych and Therapeutic Recreation  Pet therapy and Stress management  Skilled Therapeutic Intervention Evaluation completed (see details above and below) with education on PT POC. 3 hr CIR requirement, goals and individual treatment. Pt received supine in bed, pleasant affect and agreeable to PT, denied pain. Pt verbalized need to change briefs, rolled L & R /min A and use of bedrail to doff soiled brief and don new one w/total A. Upon asking pt to sit EOB, she became extremely anxious and perseverated on calling nurse to receive anxiety medication. Repeatedly informed pt that nursing had been notified, perseveration continued. Nursing present to administer meds and attempted supine <> sit EOB w/mod A for trunk support and heavy verbal cues for diaphragmatic breathing. At EOB, pt extremely anxious and trembling, required min A from therapist to prevent posterior LOB. Pt kicking feet and  aggressively gripping onto bed to prevent posterior lean, provided multimodal cues to keep feet on floor and weight shifted forward, pt unable to follow. Sit EOB <>supine w/mod A to allow therapist to grab +2. Supine <>sit EOB w/mod A w/+2 to assist in reducing anxiety. Attempted lateral sliding board transfer to L  side, total A +2. Pt unable to follow cues and demonstrated fear-avoidance behavior. Provided multimodal cues for weight shift, hand placement and reassurance that she would not fall, did not reduce anxiety. Once in [facility], pt gripped both armrests tightly and requested therapist to stay in room w/her due to her feeling as though she was going to fall out of WC. Assured pt she was in safe WC and propped her using pillows and towel rolls. No safety plan present in room for therapist to update, nursing notified of situation and recommended transfer w/Stedy +2. Pt was left sitting in WC w/safety belt on, all needs in reach.   Mobility Bed Mobility Bed Mobility: Rolling Right;Rolling Left;Supine to Sit;Scooting to [facility];Sitting - Scoot to Edge of Bed;Sit to Supine Rolling Right: Minimal Assistance - Patient > 75% (Bedrail) Rolling Left: Minimal Assistance - Patient > 75% (Bedrail) Supine to Sit: Moderate Assistance - Patient 50-74% Sitting - Scoot to Edge of Bed: Moderate Assistance - Patient 50-74% Sit to Supine: Moderate Assistance - Patient 50-74% Scooting to [facility]: Minimal Assistance - Patient > 75% Transfers Transfers: Lateral/Scoot Transfers Lateral/Scoot Transfers: 2 Press photographer (Assistive device): Other (Comment) (Sliding board) Locomotion  Gait Ambulation: No Gait Gait: No Stairs / Additional Locomotion Stairs: No Wheelchair Mobility Wheelchair Mobility: No   Discharge Criteria: Patient will be discharged from PT if patient refuses treatment 3 consecutive times without medical reason, if treatment goals not met, if there is a change in medical status, if patient makes no  progress towards goals or if patient is discharged from hospital.  The above assessment, treatment plan, treatment alternatives and goals were discussed and mutually agreed upon: by patient  Cruzita Lederer Tashonda Pinkus, PT, DPT 09/07/2020, 10:11 AM

## 2020-09-07 NOTE — Progress Notes (Signed)
Physical Therapy Session Note  Patient Details  Name: Diane Willis MRN: [id number] Date of Birth: Feb 23, 1955  Today's Date: 09/07/2020 PT Individual Time: 1016-1050 PT Individual Time Calculation (min): 34 min   Short Term Goals: Week 1:  PT Short Term Goal 1 (Week 1): Pt will perform sit <>supine w/min A PT Short Term Goal 2 (Week 1): Pt will perform sit <>stand w/mod A and LRAD PT Short Term Goal 3 (Week 1): Pt will perform bed <>chair transfers w/LRAD and mod A  Skilled Therapeutic Interventions/Progress Updates:  Ambulation/gait training;Community reintegration;DME/adaptive equipment instruction;Neuromuscular re-education;Psychosocial support;Stair training;UE/LE Strength taining/ROM;Wheelchair propulsion/positioning;Balance/vestibular training;Discharge planning;Functional electrical stimulation;Pain management;Therapeutic Activities;UE/LE Coordination activities;Cognitive remediation/compensation;Disease management/prevention;Functional mobility training;Patient/family education;Splinting/orthotics;Therapeutic Exercise;Visual/perceptual remediation/compensation   Patient seated in w/c on entrance to room in full body extension. Patient alert and agreeable to PT session. Patient with no pain complaint throughout session, but increased anxiety demonstrated with forward lean/ attempts to stand at [facility]. Attempts to scoot back in seat without leaning forward without success.   Therapeutic Activity: Bed Mobility: Patient performed sit--> supine with Min A for BLE to bed surface. VC/ tc required for technique and increasing effort for abdominal activation and hip flexion. Transfers: Attempt for pt to perform STS w/c to RW with pt unable to demonstrate adequate forward lean, unweight of bottom from seat, and inability to plant feet on floor. Focus on NMR for improved mechanics with little improvement this day, but determination that pt can perform better with pull-to-stand technique at this time.  Determined to be safe to transfer with staff using STEDY lift +2 for balance.  Neuromuscular Re-ed: NMR facilitated during session with focus on forward lean, weight shift over feet, and decreased extensor tone with efforts. Pt guided in use of STEDY for STS transfers performed with CGA throughout session. With pt able to pull to stand, she is able to maintain foot position on floor without extensor push and pull UB into appropriate forward lean. Guided into minisquat performance x5 in order to promote hip/ knee flexion with forward lean of trunk with tc for anterior pelvic rotation. Pt guided in standing marches in STEDY to promote lateral weight shift. Performs with difficulty and requires several attempts to replace foot into original position. NMR performed for improvements in motor control and coordination, balance, sequencing, judgement, and self confidence/ efficacy in performing all aspects of mobility at highest level of independence.   Patient supine  in bed at end of session with brakes locked, bed alarm set, and all needs within reach. Pt positioned to her comfort, oriented to current time and time of next therapy session.    Therapy Documentation Precautions:  Precautions Precautions: Fall Precaution Comments: ataxic, sensation of falling with movement, anxious Restrictions Weight Bearing Restrictions: No  Pain: Pain Assessment Pain Score: 0-No pain  Therapy/Group: Individual Therapy  Loel Dubonnet PT, DPT 09/07/2020, 3:49 PM

## 2020-09-07 NOTE — Progress Notes (Signed)
Physical Therapy Session Note  Patient Details  Name: Diane Willis MRN: [id number] Date of Birth: 1955/12/26  Today's Date: 09/07/2020 PT Individual Time: 1105-1200 PT Individual Time Calculation (min): 55 min   Short Term Goals: Week 1:  PT Short Term Goal 1 (Week 1): Pt will perform sit <>supine w/min A PT Short Term Goal 2 (Week 1): Pt will perform sit <>stand w/mod A and LRAD PT Short Term Goal 3 (Week 1): Pt will perform bed <>chair transfers w/LRAD and mod A  Skilled Therapeutic Interventions/Progress Updates: Prior to session PTA discussed current pt status with evaluating and secondary PT. Pt presented in bed agreeable to therapy. Pt denies pain but expressing some fatigue from previous therapies. Rest breaks and emotional support provided as needed due to increased anxiety. Pt performed supine to sit with CGA and increased time with use of bed features. Pt refused PTA touching R foot to adjust sock. PTA inquired about sock and pt states since Covid dx has not been able to tolerate anyone touching/looking at foot. Inquired about bathing as is part of CIR process with pt stating "I can do that at home". Pt states not sensitive to touch and has been able to touch foot on own. Pt agreeable to OOB activity and with increased time and cues for diaphragmatic breathing due to increased anxiety pt donned second gown with minA and PTA providing verbal cues for maintaining feet on ground and maintaining neutral sitting as pt tended to lean posteriorly. Once completed performed STS in Stedy with CGA and transferred to w/c. In w/c pt noted to immediately go into sacral sitting and slide forward. With mod multimodal cues and PTA blocking B feet pt was able to scoot posteriorly in w/c to more appropriate position. Pt then transported throughout unit to have pt become acclimated to space. In hallway pt initiated w/c mobility and was able to propel with minA. Pt then transported to parallel bars and was  able to perform STS with minA and PTA blocking B knees. Pt required max multimodal cues for improved anterior translation of hips and erect posture. Once upright pt was able to ambulate ~43f in parallel bars with minA. Pt noted to have narrow BOS and slight scissoring gait. Pt returned to w/c and performed Stedy transfer back to bed in same manner as prior. Pt performed sit to supine with CGA and was independent managing BLE onto bed with increased time/effort. Pt required minA to reposition to comfort. Pt left in bed at end of sesison with bed alarm on, call bell within reach and needs met.      Therapy Documentation Precautions:  Precautions Precautions: Fall Precaution Comments: ataxic, sensation of falling with movement, anxious Restrictions Weight Bearing Restrictions: No   Therapy/Group: Individual Therapy  Latrise Bowland Kaydee Magel, PTA  09/07/2020, 12:29 PM

## 2020-09-08 DIAGNOSIS — G320 Subacute combined degeneration of spinal cord in diseases classified elsewhere: Secondary | ICD-10-CM | POA: Diagnosis not present

## 2020-09-08 DIAGNOSIS — E538 Deficiency of other specified B group vitamins: Secondary | ICD-10-CM | POA: Diagnosis not present

## 2020-09-08 LAB — GLUCOSE, CAPILLARY
Glucose-Capillary: 273 mg/dL — ABNORMAL HIGH (ref 70–99)
Glucose-Capillary: 169 mg/dL — ABNORMAL HIGH (ref 70–99)
Glucose-Capillary: 221 mg/dL — ABNORMAL HIGH (ref 70–99)
Glucose-Capillary: 130 mg/dL — ABNORMAL HIGH (ref 70–99)

## 2020-09-08 MED ORDER — INSULIN ASPART PROT & ASPART (70-30 MIX) 100 UNIT/ML ~~LOC~~ SUSP
30.0000 [IU] | Freq: Two times a day (BID) | SUBCUTANEOUS | Status: DC
  Administered 2020-09-08 – 2020-09-11 (×6): 30 [IU] via SUBCUTANEOUS

## 2020-09-08 NOTE — Progress Notes (Signed)
Pt declined removing socks for nurse assessment.

## 2020-09-08 NOTE — Progress Notes (Signed)
Pt iv"s removed per order. Pt tolerate well

## 2020-09-08 NOTE — Progress Notes (Signed)
Patient asked reason for not allowing assessment of feet patient states" I just don't want to have someone look at my feet." Patient educated about the reason a thorough skin assessment is warranted and still refused to let staff inspect her feet Will notify Md in am

## 2020-09-08 NOTE — Progress Notes (Signed)
PROGRESS NOTE   Subjective/Complaints:  Pt reports had a bladder accident this AM- upset about this and needing to be cleaned up.  Feels like getting a little stronger- hands are still numb- explained this will be a slow improvement.  Had BM last night with Sorbitol- very hard ~ 2am.    ROS:  Pt denies SOB, abd pain, CP, N/V/C/D, and vision changes   Objective:   VAS US LOWER EXTREMITY VENOUS (DVT)  Result Date: 09/07/2020  Lower Venous DVT Study Patient Name:  Diane Willis  Date of Exam:   09/07/2020 Medical Rec #: [id number]        Accession #:    [id number] Date of Birth: 17-May-1955       Patient Gender: F Patient Age:   [age] years [facility] Procedure:      VAS US LOWER EXTREMITY VENOUS (DVT) Referring Phys: PAMELA LOVE --------------------------------------------------------------------------------  Indications: Immobility. Other Indications: Subacute combined degeneration of spinal cord - weakness of                    LEs. Risk Factors: 2021. Comparison Study: Previous exam 04/28/2019 negative Performing Technologist: Ernestene MentionJody Hill RVT, RDMS  Examination Guidelines: A complete evaluation includes B-mode imaging, spectral Doppler, color Doppler, and power Doppler as needed of all accessible portions of each vessel. Bilateral testing is considered an integral part of a complete examination. Limited examinations for reoccurring indications may be performed as noted. The reflux portion of the exam is performed with the patient in reverse Trendelenburg.  +---------+---------------+---------+-----------+----------+--------------+ RIGHT    CompressibilityPhasicitySpontaneityPropertiesThrombus Aging +---------+---------------+---------+-----------+----------+--------------+ CFV      Full           Yes      Yes                                  +---------+---------------+---------+-----------+----------+--------------+ SFJ      Full                                                        +---------+---------------+---------+-----------+----------+--------------+ FV Prox  Full           Yes      Yes                                 +---------+---------------+---------+-----------+----------+--------------+ FV Mid   Full           Yes      Yes                                 +---------+---------------+---------+-----------+----------+--------------+ FV DistalFull           Yes      Yes                                 +---------+---------------+---------+-----------+----------+--------------+  PFV      Full                                                        +---------+---------------+---------+-----------+----------+--------------+ POP      Full           Yes      Yes                                 +---------+---------------+---------+-----------+----------+--------------+ PTV      Full                                                        +---------+---------------+---------+-----------+----------+--------------+ PERO     Full                                                        +---------+---------------+---------+-----------+----------+--------------+   +---------+---------------+---------+-----------+----------+--------------+ LEFT     CompressibilityPhasicitySpontaneityPropertiesThrombus Aging +---------+---------------+---------+-----------+----------+--------------+ CFV      Full           Yes      Yes                                 +---------+---------------+---------+-----------+----------+--------------+ SFJ      Full                                                        +---------+---------------+---------+-----------+----------+--------------+ FV Prox  Full           Yes      Yes                                  +---------+---------------+---------+-----------+----------+--------------+ FV Mid   Full           Yes      Yes                                 +---------+---------------+---------+-----------+----------+--------------+ FV DistalFull           Yes      Yes                                 +---------+---------------+---------+-----------+----------+--------------+ PFV      Full                                                        +---------+---------------+---------+-----------+----------+--------------+  POP      Full           Yes      Yes                                 +---------+---------------+---------+-----------+----------+--------------+ PTV      Full                                                        +---------+---------------+---------+-----------+----------+--------------+ PERO     Full                                                        +---------+---------------+---------+-----------+----------+--------------+     Summary: BILATERAL: - No evidence of deep vein thrombosis seen in the lower extremities, bilaterally. - No evidence of superficial venous thrombosis in the lower extremities, bilaterally. -No evidence of popliteal cyst, bilaterally.   *See table(s) above for measurements and observations. Electronically signed by Sherald Hess MD on 09/07/2020 at 6:03:23 PM.    Final    Recent Labs    09/07/20 0511  WBC 7.8  HGB 8.9*  HCT 28.0*  PLT 130*   Recent Labs    09/07/20 0511  NA 139  K 4.7  CL 106  CO2 23  GLUCOSE 357*  BUN 34*  CREATININE 1.29*  CALCIUM 9.0    Intake/Output Summary (Last 24 hours) at 09/08/2020 1403 Last data filed at 09/08/2020 0740 Gross per 24 hour  Intake 360 ml  Output --  Net 360 ml        Physical Exam: Vital Signs Blood pressure (!) 125/57, pulse 75, temperature 98.4 F (36.9 C), resp. rate 20, height 5\' 1"  (1.549 m), SpO2 100 %.    General: awake, alert, appropriate, sitting up in  bed; upset about bladder incontinence;  NAD HENT: conjugate gaze; oropharynx moist CV: regular rate; no JVD Pulmonary: CTA B/L; no W/R/R- good air movement GI: soft, NT, ND, (+)BS Psychiatric: appropriate; sad Neurological: Ox3 Decreased to light touch in hands and below knees B/L  Musculoskeletal:     Cervical back: Normal range of motion. No rigidity.     Comments: RUE- 4/5 except FA 4/5 LUE- 4/5 except FA 4-/5 RLE- HF 3-/5, at least 3/5 otherwise- so painful, cannot tolerate me touching her feet/legs LLE- HF 3+/5 -otherwise at least 3/5  Skin:    Comments: Bilateral fingers and palms with discoloration and mild edema.  Slightly cool to touch Buttocks and privates- no skin breakdown    Assessment/Plan: 1. Functional deficits which require 3+ hours per day of interdisciplinary therapy in a comprehensive inpatient rehab setting. Physiatrist is providing close team supervision and 24 hour management of active medical problems listed below. Physiatrist and rehab team continue to assess barriers to discharge/monitor patient progress toward functional and medical goals  Care Tool:  Bathing    Body parts bathed by patient: Right arm, Left arm, Chest, Abdomen, Front perineal area, Right upper leg, Left upper leg, Face   Body parts bathed by helper: Buttocks, Right lower leg, Left lower leg     Bathing assist  Assist Level: Maximal Assistance - Patient 24 - 49% (STS in stedy)     Upper Body Dressing/Undressing Upper body dressing   What is the patient wearing?: Pull over shirt    Upper body assist Assist Level: Minimal Assistance - Patient > 75%    Lower Body Dressing/Undressing Lower body dressing      What is the patient wearing?: Pants, Incontinence brief     Lower body assist Assist for lower body dressing: Total Assistance - Patient < 25%     Toileting Toileting    Toileting assist Assist for toileting: Dependent - Patient 0% (stedy)     Transfers Chair/bed  transfer  Transfers assist     Chair/bed transfer assist level: Dependent - mechanical lift (stedy)     Locomotion Ambulation   Ambulation assist   Ambulation activity did not occur: Safety/medical concerns (Anxiety, BLE weakness, SCI)  Assist level: Minimal Assistance - Patient > 75% Assistive device: Parallel bars Max distance: 22ft   Walk 10 feet activity   Assist  Walk 10 feet activity did not occur: Safety/medical concerns (Anxiety, BLE weakness, SCI)        Walk 50 feet activity   Assist Walk 50 feet with 2 turns activity did not occur: Safety/medical concerns (Anxiety, BLE weakness, SCI)         Walk 150 feet activity   Assist Walk 150 feet activity did not occur: Safety/medical concerns (Anxiety, BLE weakness, SCI)         Walk 10 feet on uneven surface  activity   Assist Walk 10 feet on uneven surfaces activity did not occur: Safety/medical concerns (Anxiety, BLE weakness, SCI)         Wheelchair     Assist Is the patient using a wheelchair?: Yes Type of Wheelchair: Manual Wheelchair activity did not occur: Safety/medical concerns (High anxiety, fear-avoidance behavior)  Wheelchair assist level: Minimal Assistance - Patient > 75% Max wheelchair distance: 58ft    Wheelchair 50 feet with 2 turns activity    Assist    Wheelchair 50 feet with 2 turns activity did not occur: Safety/medical concerns (High anxiety, fear-avoidance behavior)       Wheelchair 150 feet activity     Assist  Wheelchair 150 feet activity did not occur: Safety/medical concerns (High anxiety, fear-avoidance behavior)       Blood pressure (!) 125/57, pulse 75, temperature 98.4 F (36.9 C), resp. rate 20, height 5\' 1"  (1.549 m), SpO2 100 %.  Medical Problem List and Plan: 1.  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  Antithrombotics: -DVT/anticoagulation:  Mechanical: Sequential compression devices, below knee Bilateral lower extremities since Plts are 66k 8/27- Plts up to 130k- if they stay up on Monday, will try Lovenox- esp with hx of PE.              -antiplatelet therapy: ASA             --H/o PE last year-- will check BLE dopplers with recent immobility/BLE weakness  8/27- Dopplers (-)  3. Pain Management: Tylenol prn.  4. Mood: LCSW to follow for evaluation and support.              -antipsychotic agents: N/A 5.  Neuropsych: This patient is capable of making decisions on his own behalf. 6. Skin/Wound Care: Routine pressure relief measures.  7. Fluids/Electrolytes/Nutrition: Monitor I/O. Check lytes in am.  8.  T2DM: Hgb A1C-7.8--monitor BS ac/hs and use SSI for elevated Bs             --Continue 70/30 insulin 25 units bid.   8/27- BG's running 200s-300s- will increase 70/30 to 30 units BID 9. Vitamin B12: B12< 50.               --B12 1000 mcg daily X 7 days-->weekly for 7 weeks then monthly.  --lifelong B12 1000 mcg monthly supplement after loading doses.  10. 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 Thrombocytopenia: Platelets with steady drop 98 --> 68.             --was 149-171 range earlier this month and not on any heparin products.              --pancytopenia noted-->drop in WBC/HGB/plts over past year w/hypersegmented neutrophils --? Hematology consult.  8/27- look better- will recheck Monday and if better, will wait on Heme consult  11. Vitamin B1 deficiency: Thiamine 53.1--continue supplement.  12. Anxiety d/o: Continue Xanax prn.  13. 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  8/27- having accidents- of bowel and bladder- will d/w pt tomorrow about bowel program, and timed voiding          LOS: 2 days A FACE TO FACE EVALUATION WAS  PERFORMED  Alka Falwell 09/08/2020, 2:03 PM

## 2020-09-09 DIAGNOSIS — G320 Subacute combined degeneration of spinal cord in diseases classified elsewhere: Secondary | ICD-10-CM | POA: Diagnosis not present

## 2020-09-09 DIAGNOSIS — E538 Deficiency of other specified B group vitamins: Secondary | ICD-10-CM | POA: Diagnosis not present

## 2020-09-09 LAB — GLUCOSE, CAPILLARY
Glucose-Capillary: 132 mg/dL — ABNORMAL HIGH (ref 70–99)
Glucose-Capillary: 183 mg/dL — ABNORMAL HIGH (ref 70–99)
Glucose-Capillary: 163 mg/dL — ABNORMAL HIGH (ref 70–99)
Glucose-Capillary: 104 mg/dL — ABNORMAL HIGH (ref 70–99)

## 2020-09-09 NOTE — Progress Notes (Signed)
Nurse contacted spouse r/t pt refusing skin assessment on feet. Spouse states they can not recall the last time they have seen pt feet.Marland Kitchen Spouse stated pt has had the same socks on for 3 weeks prior to admission. Spouse states " I saw dark areas around her heel that looked like her hands". Spouse stated they have asked pt to "think about it". Spouse stated they will speak with pt again once they arrive.

## 2020-09-09 NOTE — Progress Notes (Signed)
Nurse and MD discussed importance of skin assessment to feet with pt. Pt continues to refuse skin assessment on feet at this time. "Since I been like this I don't want people seeing my feet".

## 2020-09-09 NOTE — Progress Notes (Signed)
Occupational Therapy Session Note  Patient Details  Name: Diane Willis MRN: [id number] Date of Birth: [birth year]  Today's Date: 09/09/2020 OT Individual Time: 7425-9563 OT Individual Time Calculation (min): 57 min   OT Individual Time: 8756-4332 OT Individual Time Calculation (min): 46 min   Short Term Goals: Week 1:  OT Short Term Goal 1 (Week 1): Pt will sit EOB/EOM for 5 min with no more than MIN A dynamically OT Short Term Goal 2 (Week 1): pt will weight shift forward in mod ranges outside BOS wiht min VC for breathing to demo improved coping strategies OT Short Term Goal 3 (Week 1): Pt will don shirt with S OT Short Term Goal 4 (Week 1): Pt will sit to stand iwht LRAD and MAX A of 1 in prep for BADL  Skilled Therapeutic Interventions/Progress Updates:  Session 1: Patient met lying supine in bed in agreement with OT treatment session. 0/10 pain reported at rest and with activity. Supine to EOB with patient able to advance BLE from bed surface to EOB. Minimal assist to elevate trunk. Increased anxiety upon sitting EOB requiring Min A to maintain static sitting balance initially. Patient participated in deep belly breathing and mindfulness/prayer to decrease anxiety. Noted ataxia and bilateral extensor spasms in BLE throughout session worsening with attempted functional transfers. Sit to stand in stedy with Min A for anterior weight shift necessary to bring paddles down. UB bathing/dressing with Min A in perched position in stedy at sink level. Max A for LB bathing. Patient continues to decline donning/doffing footwear. Total A for wc transport to dayroom for time management. Focus on pre-transfer tasks including anterior weight shift, wc push-ups and anterior/posterior scooting in wc. Patient again limited by BLE ataxia and extensor spasms. Unable to safely attempt lateral scoot/squat pivot or sit to stand transfers with use of RW at this time. Session concluded with patient seated in wc  with call bell within reach, belt alarm activated and all needs met. Patient to trial sitting in wc for 30 min before returning to bed with nursing staff.   Session 2: Patient met during bedlevel hygiene/clothing management with NT. Patient incontinent of bladder requiring Min A for rolling R<>L in supine with use of bed rail. With [facility] flat patient requires Min A and increased time/effort to elevate trunk. Able to advance BLE without external assist. CGA for sit to stand in stedy throughout session. Total A for wc transport to dayroom for time management. With use of Stedy patient transferred to NuStep after detailed explanation provided. Some difficulty positioning patient secondary to extensor tremors in BLE and increased anxiety. Patient tolerated NuStep for 10 min with 2 rest breaks. Returned to supine via [place] with Min guard for safety. Bed alarm activated and all needs within reach.    Therapy Documentation Precautions:  Precautions Precautions: Fall Precaution Comments: ataxic, sensation of falling with movement, anxious Restrictions Weight Bearing Restrictions: No   Therapy/Group: Individual Therapy  [name] 09/09/2020, 6:56 AM

## 2020-09-09 NOTE — IPOC Note (Signed)
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) Active Problems:   Controlled type 2 diabetes mellitus with hyperglycemia (HCC)     Functional Problem List: Nursing Bladder, Bowel, Endurance, Medication Management, Safety  PT Balance, Behavior, Endurance, Motor, Safety, Sensory  OT Balance, Endurance, Motor, Pain, Safety, Sensory, Skin Integrity  SLP    TR         Basic ADL's: OT Grooming, Bathing, Dressing, Toileting     Advanced  ADL's: OT       Transfers: PT Bed Mobility, Bed to Chair, Car, Occupational psychologist, Research scientist (life sciences): PT Ambulation, Psychologist, prison and probation services, Stairs     Additional Impairments: OT    SLP        TR      Anticipated Outcomes Item Anticipated Outcome  Self Feeding no goal  Swallowing      Basic self-care  S UB, MIN A LB  Toileting  MIN A   Bathroom Transfers MIN A  Bowel/Bladder  min assist  Transfers  Supervision  Locomotion  Min A  Communication     Cognition     Pain  n/a  Safety/Judgment  Min assist and no falls   Therapy Plan: PT Intensity: Minimum of 1-2 x/day ,45 to 90 minutes PT Frequency: 5 out of 7 days PT Duration Estimated Length of Stay: 3-4 weeks OT Intensity: Minimum of 1-2 x/day, 45 to 90 minutes OT Frequency: 5 out of 7 days OT Duration/Estimated Length of Stay: 3-4 weeks     Due to the current state of emergency, patients may not be receiving their 3-hours of Medicare-mandated therapy.   Team Interventions: Nursing Interventions Patient/Family Education, Bladder Management, Bowel Management, Disease Management/Prevention, Medication Management, Discharge Planning  PT interventions Ambulation/gait training, Community reintegration, DME/adaptive equipment instruction,  Neuromuscular re-education, Psychosocial support, Stair training, UE/LE Strength taining/ROM, Wheelchair propulsion/positioning, Warden/ranger, Discharge planning, Functional electrical stimulation, Pain management, Therapeutic Activities, UE/LE Coordination activities, Cognitive remediation/compensation, Disease management/prevention, Functional mobility training, Patient/family education, Splinting/orthotics, Therapeutic Exercise, Visual/perceptual remediation/compensation  OT Interventions Balance/vestibular training, Discharge planning, Pain management, Therapeutic Activities, Self Care/advanced ADL retraining, UE/LE Coordination activities, Therapeutic Exercise, Skin care/wound managment, Patient/family education, Functional mobility training, Disease mangement/prevention, Community reintegration, Fish farm manager, Neuromuscular re-education, Psychosocial support, Splinting/orthotics, UE/LE Strength taining/ROM, Wheelchair propulsion/positioning  SLP Interventions    TR Interventions    SW/CM Interventions Discharge Planning, Psychosocial Support, Patient/Family Education   Barriers to Discharge MD  Medical stability, Home enviroment access/loayout, Incontinence, Neurogenic bowel and bladder, Lack of/limited family support, Weight, Weight bearing restrictions, Medication compliance, and Behavior  Nursing Decreased caregiver support, Home environment access/layout, Incontinence, Neurogenic Bowel & Bladder, Lack of/limited family support, Weight bearing restrictions, Weight, Medication compliance Lives with spouse in 1 level home with 4 steps to enter and no rails. Spouse will provide assist at discharge.  PT Home environment access/layout, Behavior Very anxious, fear-avoidance  OT Incontinence, Inaccessible home environment, Decreased caregiver support, Other (comments) (anxiety, ?sking integrity on B feet d/t sensory deficits/refusal to allow skin assessment)    SLP       SW Insurance for SNF coverage     Team Discharge Planning: Destination: PT-Home ,OT- Home , SLP-  Projected Follow-up: PT-Home health PT, OT-  Home health OT, SLP-  Projected Equipment Needs: PT-To be determined, OT- To be determined,  Tub/shower seat, 3 in 1 bedside comode, SLP-  Equipment Details: PT- , OT-  Patient/family involved in discharge planning: PT- Patient,  OT-Patient, SLP-   MD ELOS: 3-4 weeks Medical Rehab Prognosis:  Good Assessment: Pt is a 65 yr old female with degeneration of [place]- with incomplete quadriplegia- nontraumatic-  Also has neurogenic bowel and bladder- will NOT let anyone look at her feet- concern for wounds? Even spouse isn't allowed to look.  Goals min A    See Team Conference Notes for weekly updates to the plan of care

## 2020-09-09 NOTE — Progress Notes (Signed)
Physical Therapy Session Note  Patient Details  Name: Diane Willis MRN: [id number] Date of Birth: 05/11/1955  Today's Date: 09/09/2020 PT Individual Time: 1000-1100; 1615-1700 PT Individual Time Calculation (min): 60 min and 45 min  Short Term Goals: Week 1:  PT Short Term Goal 1 (Week 1): Pt will perform sit <>supine w/min A PT Short Term Goal 2 (Week 1): Pt will perform sit <>stand w/mod A and LRAD PT Short Term Goal 3 (Week 1): Pt will perform bed <>chair transfers w/LRAD and mod A  Skilled Therapeutic Interventions/Progress Updates:    Session 1: Pt received seated in bed, agreeable to PT session. No complaints of pain. Supine to sit with CGA for trunk control, HOB elevated and use of bedrail. Stedy transfer to w/c with CGA. Pt does require assist for BLE placement with transfers and sit to stand due to impaired proprioception and control of BLE. Provided patient with 18x18 w/c for improved fit and attempted placement of anterior wedge to prevent posterior pelvic tilt and sliding forwards out of chair, patient would likely benefit from "dumped" w/c position. Built up foot rests for improved ability to use BLE to scoot back in w/c as well as improved positioning in seated position. Sit to stand in // bars with CGA, knees blocked for safety. Standing lateral weight shift L/R progressing to alt L/R marches, 2 x 15 reps to fatigue. Pt even able to take a few steps backwards to w/c to sit down. Extensor tone noted in sitting with BLE "kicking out" and trunk extension leading to patient feeling that she is sliding forwards out of w/c. Pt unsafe to remain seated alone in w/c at this time. Stedy transfer back to bed with CGA. Sit to supine Supervision with increased time and use of bedrails. Pt left seated in bed with needs in reach, bed alarm in place.  Session 2: Pt received seated in bed, agreeable to PT session. No complaints of pain. Provided patient with handled cup for improved ability to  drink water more independently. Pt requires increased time to grasp handle of cup but able to drink from it independently vs not being able to grip styrofoam cup. Supine to sit with min A for some trunk control, HOB elevated and use of bedrails. Sit to stand with CGA to stedy, stedy transfer to w/c. Provided ELR and more "dumped" w/c this session and adjusted to patient fit for improved positioning in chair. Pt continues to exhibit posterior pelvic tilt in sitting and due to extensor tone slides forwards in chair, anxious about falling. Stedy transfer back to bed. Sit to supine Supervision. Pt left seated in bed with needs in reach, bed alarm in place.  Therapy Documentation Precautions:  Precautions Precautions: Fall Precaution Comments: ataxic, sensation of falling with movement, anxious Restrictions Weight Bearing Restrictions: No    Therapy/Group: Individual Therapy   Peter Congo, PT, DPT, CSRS  09/09/2020, 12:18 PM

## 2020-09-09 NOTE — Progress Notes (Signed)
PROGRESS NOTE   Subjective/Complaints:   Pt reports has "gotten funny about feet" and ABSOLUTELY REFUSING to allow anyone to look at her feet, or take a picture and let us see- husband, per phone, even stated that he hadn't seen for at least 3 weeks prior to admission,  Pt got tearful when asked.  Pt says she had BM yesterday on bedpan- but was also documented that had incontinent BM last night/overnight.  but admits having bladder accidents- upsetting- suggest timed voiding- will order.   ROS:  Pt denies SOB, abd pain, CP, N/V/C/D, and vision changes   Objective:   VAS [place] LOWER EXTREMITY VENOUS (DVT)  Result Date: 09/07/2020  Lower Venous DVT Study Patient Name:  Diane Willis  Date of Exam:   09/07/2020 Medical Rec #: [id number]        Accession #:    [id number] Date of Birth: Oct 31, 1955       Patient Gender: F Patient Age:   65 years Exam Location:  [facility] Procedure:      VAS [place] LOWER EXTREMITY VENOUS (DVT) Referring Phys: PAMELA LOVE --------------------------------------------------------------------------------  Indications: Immobility. Other Indications: Subacute combined degeneration of spinal cord - weakness of                    LEs. Risk Factors: 2021. Comparison Study: Previous exam 04/28/2019 negative Performing Technologist: Ernestene Mention RVT, RDMS  Examination Guidelines: A complete evaluation includes B-mode imaging, spectral Doppler, color Doppler, and power Doppler as needed of all accessible portions of each vessel. Bilateral testing is considered an integral part of a complete examination. Limited examinations for reoccurring indications may be performed as noted. The reflux portion of the exam is performed with the patient in reverse Trendelenburg.  +---------+---------------+---------+-----------+----------+--------------+ RIGHT    CompressibilityPhasicitySpontaneityPropertiesThrombus Aging  +---------+---------------+---------+-----------+----------+--------------+ CFV      Full           Yes      Yes                                 +---------+---------------+---------+-----------+----------+--------------+ SFJ      Full                                                        +---------+---------------+---------+-----------+----------+--------------+ FV Prox  Full           Yes      Yes                                 +---------+---------------+---------+-----------+----------+--------------+ FV Mid   Full           Yes      Yes                                 +---------+---------------+---------+-----------+----------+--------------+ FV DistalFull  Yes      Yes                                 +---------+---------------+---------+-----------+----------+--------------+ PFV      Full                                                        +---------+---------------+---------+-----------+----------+--------------+ POP      Full           Yes      Yes                                 +---------+---------------+---------+-----------+----------+--------------+ PTV      Full                                                        +---------+---------------+---------+-----------+----------+--------------+ PERO     Full                                                        +---------+---------------+---------+-----------+----------+--------------+   +---------+---------------+---------+-----------+----------+--------------+ LEFT     CompressibilityPhasicitySpontaneityPropertiesThrombus Aging +---------+---------------+---------+-----------+----------+--------------+ CFV      Full           Yes      Yes                                 +---------+---------------+---------+-----------+----------+--------------+ SFJ      Full                                                         +---------+---------------+---------+-----------+----------+--------------+ FV Prox  Full           Yes      Yes                                 +---------+---------------+---------+-----------+----------+--------------+ FV Mid   Full           Yes      Yes                                 +---------+---------------+---------+-----------+----------+--------------+ FV DistalFull           Yes      Yes                                 +---------+---------------+---------+-----------+----------+--------------+ PFV      Full                                                        +---------+---------------+---------+-----------+----------+--------------+  POP      Full           Yes      Yes                                 +---------+---------------+---------+-----------+----------+--------------+ PTV      Full                                                        +---------+---------------+---------+-----------+----------+--------------+ PERO     Full                                                        +---------+---------------+---------+-----------+----------+--------------+     Summary: BILATERAL: - No evidence of deep vein thrombosis seen in the lower extremities, bilaterally. - No evidence of superficial venous thrombosis in the lower extremities, bilaterally. -No evidence of popliteal cyst, bilaterally.   *See table(s) above for measurements and observations. Electronically signed by Sherald Hess MD on 09/07/2020 at 6:03:23 PM.    Final    Recent Labs    09/07/20 0511  WBC 7.8  HGB 8.9*  HCT 28.0*  PLT 130*   Recent Labs    09/07/20 0511  NA 139  K 4.7  CL 106  CO2 23  GLUCOSE 357*  BUN 34*  CREATININE 1.29*  CALCIUM 9.0    Intake/Output Summary (Last 24 hours) at 09/09/2020 1439 Last data filed at 09/09/2020 1345 Gross per 24 hour  Intake 474 ml  Output --  Net 474 ml        Physical Exam: Vital Signs Blood pressure 129/73,  pulse 81, temperature 99.1 F (37.3 C), temperature source Oral, resp. rate 18, height 5\' 1"  (1.549 m), SpO2 100 %.     General: awake, alert, appropriate, sitting up in bedside chair;  NAD HENT: conjugate gaze; oropharynx moist CV: regular rate; no JVD Pulmonary: CTA B/L; no W/R/R- good air movement GI: soft, NT, ND, (+)BS; hypoactive Psychiatric: upset and tearful when asked to look at her feet- even with nurse- in tears Neurological: alert  Decreased to light touch in hands and below knees B/L  Musculoskeletal:     Cervical back: Normal range of motion. No rigidity.     Comments: RUE- 4/5 except FA 4/5 LUE- 4/5 except FA 4-/5 RLE- HF 3-/5, at least 3/5 otherwise- so painful, cannot tolerate me touching her feet/legs LLE- HF 3+/5 -otherwise at least 3/5  Skin:    Comments: Bilateral fingers and palms with discoloration and mild edema.  Slightly cool to touch Buttocks and privates- no skin breakdown    Assessment/Plan: 1. Functional deficits which require 3+ hours per day of interdisciplinary therapy in a comprehensive inpatient rehab setting. Physiatrist is providing close team supervision and 24 hour management of active medical problems listed below. Physiatrist and rehab team continue to assess barriers to discharge/monitor patient progress toward functional and medical goals  Care Tool:  Bathing    Body parts bathed by patient: Right arm, Left arm, Chest, Abdomen, Front perineal area, Right upper leg, Left upper leg, Face   Body parts bathed  by helper: Buttocks, Right lower leg, Left lower leg     Bathing assist Assist Level: Maximal Assistance - Patient 24 - 49%     Upper Body Dressing/Undressing Upper body dressing   What is the patient wearing?: Hospital gown only    Upper body assist Assist Level: Minimal Assistance - Patient > 75%    Lower Body Dressing/Undressing Lower body dressing      What is the patient wearing?: Pants, Incontinence brief      Lower body assist Assist for lower body dressing: Total Assistance - Patient < 25%     Toileting Toileting    Toileting assist Assist for toileting: Dependent - Patient 0% (stedy)     Transfers Chair/bed transfer  Transfers assist     Chair/bed transfer assist level: Dependent - mechanical lift (stedy)     Locomotion Ambulation   Ambulation assist   Ambulation activity did not occur: Safety/medical concerns (Anxiety, BLE weakness, SCI)  Assist level: Minimal Assistance - Patient > 75% Assistive device: Parallel bars Max distance: 85ft   Walk 10 feet activity   Assist  Walk 10 feet activity did not occur: Safety/medical concerns (Anxiety, BLE weakness, SCI)        Walk 50 feet activity   Assist Walk 50 feet with 2 turns activity did not occur: Safety/medical concerns (Anxiety, BLE weakness, SCI)         Walk 150 feet activity   Assist Walk 150 feet activity did not occur: Safety/medical concerns (Anxiety, BLE weakness, SCI)         Walk 10 feet on uneven surface  activity   Assist Walk 10 feet on uneven surfaces activity did not occur: Safety/medical concerns (Anxiety, BLE weakness, SCI)         Wheelchair     Assist Is the patient using a wheelchair?: Yes Type of Wheelchair: Manual Wheelchair activity did not occur: Safety/medical concerns (High anxiety, fear-avoidance behavior)  Wheelchair assist level: Minimal Assistance - Patient > 75% Max wheelchair distance: 74ft    Wheelchair 50 feet with 2 turns activity    Assist    Wheelchair 50 feet with 2 turns activity did not occur: Safety/medical concerns (High anxiety, fear-avoidance behavior)       Wheelchair 150 feet activity     Assist  Wheelchair 150 feet activity did not occur: Safety/medical concerns (High anxiety, fear-avoidance behavior)       Blood pressure 129/73, pulse 81, temperature 99.1 F (37.3 C), temperature source Oral, resp. rate 18, height 5\' 1"   (1.549 m), SpO2 100 %.  Medical Problem List and Plan: 1.  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  Antithrombotics: -DVT/anticoagulation:  Mechanical: Sequential compression devices, below knee Bilateral lower extremities since Plts are 66k 8/27- Plts up to 130k- if they stay up on Monday, will try Lovenox- esp with hx of PE.  8/28- labs qmonday AM             -antiplatelet therapy: ASA             --H/o PE last year-- will check BLE dopplers with recent immobility/BLE weakness  8/27- Dopplers (-)  3. Pain Management: Tylenol prn.  4. Mood: LCSW to follow for evaluation and  support.              -antipsychotic agents: N/A 5. Neuropsych: This patient is capable of making decisions on his own behalf. 6. Skin/Wound Care: Routine pressure relief measures.  7. Fluids/Electrolytes/Nutrition: Monitor I/O. Check lytes in am.  8.  T2DM: Hgb A1C-7.8--monitor BS ac/hs and use SSI for elevated Bs             --Continue 70/30 insulin 25 units bid.   8/27- BG's running 200s-300s- will increase 70/30 to 30 units BID  8/28- BG's now mid 100s since dose increased will give 1 more day before changing again.  9. Vitamin B12: B12< 50.               --B12 1000 mcg daily X 7 days-->weekly for 7 weeks then monthly.  --lifelong B12 1000 mcg monthly supplement after loading doses.  10. 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 Thrombocytopenia: Platelets with steady drop 98 --> 68.             --was 149-171 range earlier this month and not on any heparin products.              --pancytopenia noted-->drop in WBC/HGB/plts over past year w/hypersegmented neutrophils --? Hematology consult.  8/27- look better- 130k- will recheck  Monday and if better, will wait on Heme consult   8/28- labs in AM 11. Vitamin B1 deficiency: Thiamine  53.1--continue supplement.  12. Anxiety d/o: Continue Xanax prn.  13. 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  8/27- having accidents- of bowel and bladder- will d/w pt tomorrow about bowel program, and timed voiding  8/28- will order timed voiding q2 hours while awake. Although having bowel incontinence, not handling things well- will wait on bowel program for today.           LOS: 3 days A FACE TO FACE EVALUATION WAS PERFORMED  Chase Knebel 09/09/2020, 2:39 PM

## 2020-09-10 DIAGNOSIS — G320 Subacute combined degeneration of spinal cord in diseases classified elsewhere: Secondary | ICD-10-CM | POA: Diagnosis not present

## 2020-09-10 DIAGNOSIS — E538 Deficiency of other specified B group vitamins: Secondary | ICD-10-CM | POA: Diagnosis not present

## 2020-09-10 LAB — CBC WITH DIFFERENTIAL/PLATELET
Abs Immature Granulocytes: 0.1 10*3/uL — ABNORMAL HIGH (ref 0.00–0.07)
Basophils Absolute: 0 10*3/uL (ref 0.0–0.1)
Basophils Relative: 0 %
Eosinophils Absolute: 0.3 10*3/uL (ref 0.0–0.5)
Eosinophils Relative: 3 %
HCT: 26.9 % — ABNORMAL LOW (ref 36.0–46.0)
Hemoglobin: 8.7 g/dL — ABNORMAL LOW (ref 12.0–15.0)
Immature Granulocytes: 1 %
Lymphocytes Relative: 18 %
Lymphs Abs: 1.8 10*3/uL (ref 0.7–4.0)
MCH: 35.5 pg — ABNORMAL HIGH (ref 26.0–34.0)
MCHC: 32.3 g/dL (ref 30.0–36.0)
MCV: 109.8 fL — ABNORMAL HIGH (ref 80.0–100.0)
Monocytes Absolute: 1.1 10*3/uL — ABNORMAL HIGH (ref 0.1–1.0)
Monocytes Relative: 11 %
Neutro Abs: 7 10*3/uL (ref 1.7–7.7)
Neutrophils Relative %: 67 %
Platelets: 227 10*3/uL (ref 150–400)
RBC: 2.45 MIL/uL — ABNORMAL LOW (ref 3.87–5.11)
RDW: 15.5 % (ref 11.5–15.5)
WBC: 10.4 10*3/uL (ref 4.0–10.5)
nRBC: 0 % (ref 0.0–0.2)

## 2020-09-10 LAB — BASIC METABOLIC PANEL
Anion gap: 7 (ref 5–15)
BUN: 28 mg/dL — ABNORMAL HIGH (ref 8–23)
CO2: 24 mmol/L (ref 22–32)
Calcium: 8.9 mg/dL (ref 8.9–10.3)
Chloride: 107 mmol/L (ref 98–111)
Creatinine, Ser: 1.17 mg/dL — ABNORMAL HIGH (ref 0.44–1.00)
GFR, Estimated: 53 mL/min — ABNORMAL LOW (ref >60)
Glucose, Bld: 128 mg/dL — ABNORMAL HIGH (ref 70–99)
Potassium: 4.3 mmol/L (ref 3.5–5.1)
Sodium: 138 mmol/L (ref 135–145)

## 2020-09-10 LAB — GLUCOSE, CAPILLARY
Glucose-Capillary: 124 mg/dL — ABNORMAL HIGH (ref 70–99)
Glucose-Capillary: 149 mg/dL — ABNORMAL HIGH (ref 70–99)
Glucose-Capillary: 167 mg/dL — ABNORMAL HIGH (ref 70–99)
Glucose-Capillary: 130 mg/dL — ABNORMAL HIGH (ref 70–99)

## 2020-09-10 NOTE — Progress Notes (Signed)
Physical Therapy Session Note  Patient Details  Name: Diane Willis MRN: [id number] Date of Birth: 10-24-1955  Today's Date: 09/10/2020 PT Individual Time: 2992-4268; 1130-1200; 3419-6222 PT Individual Time Calculation (min): 75 min and 30 min and 45 min  Short Term Goals: Week 1:  PT Short Term Goal 1 (Week 1): Pt will perform sit <>supine w/min A PT Short Term Goal 2 (Week 1): Pt will perform sit <>stand w/mod A and LRAD PT Short Term Goal 3 (Week 1): Pt will perform bed <>chair transfers w/LRAD and mod A  Skilled Therapeutic Interventions/Progress Updates:    Session 1: Pt received seated in bed, agreeable to PT session. No complaints of pain. Supine to sit with min A for trunk control, HOB elevated and use of bedrail. Sit to stand with min A to stedy. Stedy transfer to w/c. Sit to stand in // bars with CGA to min A. Standing alt L/R marches x 10 reps each, impaired proprioception in BLE and narrow BOS. Ambulation x 5 ft forwards/backwards x 3 reps in // bars with min A for balance. Pt fatigues quickly taking steps in // bars. Stedy transfer to mat table. Sit to stand x 5 reps to RW from low mat table with B knees blocked, dycem under BLE to prevent sliding, and pt pulling up from RW. While in standing focus on glute activation for upright posture and decreased posterior lean. Alt L/R marches in standing x 10 reps with RW. Pt then reports onset of nausea. Stedy transfer back to w/c then back to bed. Sit to supine Supervision with use of bedrails. Nausea resolves once back in bed. Pt left seated in bed with needs in reach, bed alarm in place.  Session 2: Pt received seated in bed, agreeable to PT session. No complaints of pain. Pt agreeable to transfer to recliner to try to sit up for lunch. Supine to sit with min A for some trunk control with HOB elevated and use of bedrail. Sit to stand with CGA to stedy. Stedy transfer to recliner. Pt comfortably positioned in recliner, agreeable to sit up  for lunch. Pt left seated in recliner in room with needs in reach.  Session 3: Pt received seated in bed, agreeable to PT session. No complaints of pain. Supine to sit with min A for trunk control, HOB elevated and use of bedrail. Stedy transfer to w/c with CGA. Dependent transport via w/c to/from therapy gym. Pt with ongoing anxiety while riding in w/c due to fear of falling out of chair. Encouraged patient to sit in upright position vs leaning back to prevent sliding out of chair, slight improvement in anxiety. Stedy transfer to/from mat table with CGA. Sit to stand to RW with min A and knees blocked, dycem under BLE to prevent sliding. Sidesteps L/R 2 x 5 ft each direction with mod A for balance and assist needed to move RW. Pt transferred back to bed at end of session. Sit to supine Supervision. Pt left seated in bed with needs in reach, bed alarm in place.   Therapy Documentation Precautions:  Precautions Precautions: Fall Precaution Comments: ataxic, sensation of falling with movement, anxious Restrictions Weight Bearing Restrictions: No      Therapy/Group: Individual Therapy   Peter Congo, PT, DPT, CSRS  09/10/2020, 9:15 AM

## 2020-09-10 NOTE — Progress Notes (Addendum)
PROGRESS NOTE   Subjective/Complaints: No complaints this morning Denies pain When asked, not sure when her last BM was. Checked with RN Lurena Joiner and she had one today  ROS:  Pt denies SOB, abd pain, CP, N/V/C/D, and vision changes, +constipation   Objective:   No results found. Recent Labs    09/10/20 0619  WBC 10.4  HGB 8.7*  HCT 26.9*  PLT 227   Recent Labs    09/10/20 0619  NA 138  K 4.3  CL 107  CO2 24  GLUCOSE 128*  BUN 28*  CREATININE 1.17*  CALCIUM 8.9    Intake/Output Summary (Last 24 hours) at 09/10/2020 1328 Last data filed at 09/09/2020 1700 Gross per 24 hour  Intake 354 ml  Output --  Net 354 ml        Physical Exam: Vital Signs Blood pressure 125/62, pulse 81, temperature 99.1 F (37.3 C), resp. rate 19, height 5\' 1"  (1.549 m), SpO2 100 %.  Gen: no distress, normal appearing HEENT: oral mucosa pink and moist, NCAT Cardio: Reg rate Chest: normal effort, normal rate of breathing Abd: soft, non-distended Ext: no edema Psych: pleasant, normal affect Skin: intact Neuro: Decreased to light touch in hands and below knees B/L  Musculoskeletal:     Cervical back: Normal range of motion. No rigidity.     Comments: RUE- 4/5 except FA 4/5 LUE- 4/5 except FA 4-/5 RLE- HF 3-/5, at least 3/5 otherwise- so painful, cannot tolerate me touching her feet/legs LLE- HF 3+/5 -otherwise at least 3/5  Skin:    Comments: Bilateral fingers and palms with discoloration and mild edema.  Slightly cool to touch Buttocks and privates- no skin breakdown    Assessment/Plan: 1. Functional deficits which require 3+ hours per day of interdisciplinary therapy in a comprehensive inpatient rehab setting. Physiatrist is providing close team supervision and 24 hour management of active medical problems listed below. Physiatrist and rehab team continue to assess barriers to discharge/monitor patient progress toward  functional and medical goals  Care Tool:  Bathing    Body parts bathed by patient: Right arm, Left arm, Chest, Abdomen, Front perineal area, Right upper leg, Left upper leg, Face   Body parts bathed by helper: Buttocks, Right lower leg, Left lower leg     Bathing assist Assist Level: Maximal Assistance - Patient 24 - 49%     Upper Body Dressing/Undressing Upper body dressing   What is the patient wearing?: Hospital gown only    Upper body assist Assist Level: Minimal Assistance - Patient > 75%    Lower Body Dressing/Undressing Lower body dressing      What is the patient wearing?: Pants, Incontinence brief     Lower body assist Assist for lower body dressing: Total Assistance - Patient < 25%     Toileting Toileting    Toileting assist Assist for toileting: Maximal Assistance - Patient 25 - 49% (Bedlevel)     Transfers Chair/bed transfer  Transfers assist     Chair/bed transfer assist level: Dependent - mechanical lift (stedy)     Locomotion Ambulation   Ambulation assist   Ambulation activity did not occur: Safety/medical concerns (Anxiety, BLE weakness,  SCI)  Assist level: Minimal Assistance - Patient > 75% Assistive device: Parallel bars Max distance: 10ft   Walk 10 feet activity   Assist  Walk 10 feet activity did not occur: Safety/medical concerns (Anxiety, BLE weakness, SCI)        Walk 50 feet activity   Assist Walk 50 feet with 2 turns activity did not occur: Safety/medical concerns (Anxiety, BLE weakness, SCI)         Walk 150 feet activity   Assist Walk 150 feet activity did not occur: Safety/medical concerns (Anxiety, BLE weakness, SCI)         Walk 10 feet on uneven surface  activity   Assist Walk 10 feet on uneven surfaces activity did not occur: Safety/medical concerns (Anxiety, BLE weakness, SCI)         Wheelchair     Assist Is the patient using a wheelchair?: Yes Type of Wheelchair: Manual Wheelchair  activity did not occur: Safety/medical concerns (High anxiety, fear-avoidance behavior)  Wheelchair assist level: Minimal Assistance - Patient > 75% Max wheelchair distance: 48ft    Wheelchair 50 feet with 2 turns activity    Assist    Wheelchair 50 feet with 2 turns activity did not occur: Safety/medical concerns (High anxiety, fear-avoidance behavior)       Wheelchair 150 feet activity     Assist  Wheelchair 150 feet activity did not occur: Safety/medical concerns (High anxiety, fear-avoidance behavior)       Blood pressure 125/62, pulse 81, temperature 99.1 F (37.3 C), resp. rate 19, height 5\' 1"  (1.549 m), SpO2 100 %.  Medical Problem List and Plan: 1.  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  Antithrombotics: -DVT/anticoagulation:  Mechanical: Sequential compression devices, below knee Bilateral lower extremities since Plts are 66k 8/27- Plts up to 130k- if they stay up on Monday, will try Lovenox- esp with hx of PE.  8/28- labs qmonday AM             -antiplatelet therapy: ASA             --H/o PE last year-- will check BLE dopplers with recent immobility/BLE weakness  8/27- Dopplers (-)  3. Pain Management: Tylenol prn.  4. Mood: LCSW to follow for evaluation and support.              -antipsychotic agents: N/A 5. Neuropsych: This patient is capable of making decisions on his own behalf. 6. Skin/Wound Care: Routine pressure relief measures.  7. Fluids/Electrolytes/Nutrition: Monitor I/O. Check lytes in am.  8.  T2DM: Hgb A1C-7.8--monitor BS ac/hs and use SSI for elevated Bs             --Continue 70/30 insulin 25 units bid.   8/27- BG's running 200s-300s- will increase 70/30 to 30 units BID  8/28- BG's now mid 100s since dose increased will give 1 more day before changing again.   8/29: CBGs  improved- continue current regimen.  9. Vitamin B12: B12< 50.               --B12 1000 mcg daily X 7 days-->weekly for 7 weeks then monthly.  --lifelong B12 1000 mcg monthly supplement after loading doses.  10. Dorsal column syndrome:  due  to VitB12 deficiency 11. Thrombocytopenia: Platelets with steady drop 98 --> 68.             --was 149-171 range earlier this month and not on any heparin products.              --pancytopenia noted-->drop in WBC/HGB/plts over past year w/hypersegmented neutrophils --? Hematology consult.  8/27- look better- 130k- will recheck  Monday and if better, will wait on Heme consult   8/28- labs in AM 11. Vitamin B1 deficiency: Thiamine 53.1--continue supplement.  12. Anxiety d/o: Continue Xanax prn.  13. 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  8/27- having accidents- of bowel and bladder- will d/w pt tomorrow about bowel program, and timed voiding  8/28- will order timed voiding q2 hours while awake. Although having bowel incontinence, not handling things well- will wait on bowel program for today.   8/29: had BM today        LOS: 4 days A FACE TO FACE EVALUATION WAS PERFORMED  Mauri Tolen P Aragorn Recker 09/10/2020, 1:28 PM

## 2020-09-10 NOTE — Progress Notes (Addendum)
Occupational Therapy Session Note  Patient Details  Name: Diane Willis MRN: [id number] Date of Birth: 1955-11-13  Today's Date: 09/10/2020 OT Individual Time: 1005-1045 OT Individual Time Calculation (min): 40 min    Short Term Goals: Week 1:  OT Short Term Goal 1 (Week 1): Pt will sit EOB/EOM for 5 min with no more than MIN A dynamically OT Short Term Goal 2 (Week 1): pt will weight shift forward in mod ranges outside BOS wiht min VC for breathing to demo improved coping strategies OT Short Term Goal 3 (Week 1): Pt will don shirt with S OT Short Term Goal 4 (Week 1): Pt will sit to stand iwht LRAD and MAX A of 1 in prep for BADL  Skilled Therapeutic Interventions/Progress Updates:    Pt received supine in bed, no c/o pain and agreeable to OT. Declined shower, but requested to perform bathing at sink level. Mod A with RW to transfer to w/c, requiring cuing for hand placement. Increased extensor tone in BLE noted during transfer. Oral hygiene at sink level with setup and close supervision. Repositioning needed due to extensor tone causing pt to slide distally. Noted decrease in extensor tone with facilitated hip/knee flexion. Leg rests added to w/c to facilitate WB through LE. Anxiety noted evident by change in breathing during tx when she felt like she was going to fall with movement. Pt completed UB bathing at sink level with (S). Pt reported that LE bathing was completed earlier. Donned shirt with setup. Max A required for donning pants distally, and Max A x 2 required for standing to pull up pants. Transitioned to day room seated in w/c with wedge placed under LE while OT facilitated forward flexion at hip while pt reached forward with BUE onto table at shoulder height. Pt required Mod A x 2 for tactile cuing and support. Pt was anxious to know that she was doing a good job in therapy, and seemed to appreciate reassurance. Stedy used to transfer from w/c to bed with Mod A for leg positioning  when tone increased. Therapist facilitated knee flexion to assist with positioning for safety in [place]. Pt left in bed, 4th rail up on request, call bell nearby and bed alarm on with needs met.   Therapy Documentation Precautions:  Precautions Precautions: Fall Precaution Comments: ataxic, sensation of falling with movement, anxious Restrictions Weight Bearing Restrictions: No  Therapy/Group: Individual Therapy  Jenn Worischeck 09/10/2020, 10:21 AM

## 2020-09-11 LAB — GLUCOSE, CAPILLARY
Glucose-Capillary: 53 mg/dL — ABNORMAL LOW (ref 70–99)
Glucose-Capillary: 68 mg/dL — ABNORMAL LOW (ref 70–99)
Glucose-Capillary: 104 mg/dL — ABNORMAL HIGH (ref 70–99)
Glucose-Capillary: 165 mg/dL — ABNORMAL HIGH (ref 70–99)
Glucose-Capillary: 191 mg/dL — ABNORMAL HIGH (ref 70–99)
Glucose-Capillary: 181 mg/dL — ABNORMAL HIGH (ref 70–99)

## 2020-09-11 MED ORDER — BISACODYL 10 MG RE SUPP
10.0000 mg | Freq: Every day | RECTAL | Status: DC
  Administered 2020-09-11 – 2020-10-02 (×21): 10 mg via RECTAL
  Filled 2020-09-11 (×23): qty 1

## 2020-09-11 MED ORDER — BACLOFEN 5 MG HALF TABLET
5.0000 mg | ORAL_TABLET | Freq: Three times a day (TID) | ORAL | Status: DC
  Administered 2020-09-11 – 2020-09-17 (×18): 5 mg via ORAL
  Filled 2020-09-11 (×18): qty 1

## 2020-09-11 MED ORDER — INSULIN ASPART PROT & ASPART (70-30 MIX) 100 UNIT/ML ~~LOC~~ SUSP
28.0000 [IU] | Freq: Two times a day (BID) | SUBCUTANEOUS | Status: DC
  Administered 2020-09-11 – 2020-09-13 (×4): 28 [IU] via SUBCUTANEOUS

## 2020-09-11 MED ORDER — ENOXAPARIN SODIUM 40 MG/0.4ML IJ SOSY
40.0000 mg | PREFILLED_SYRINGE | Freq: Every day | INTRAMUSCULAR | Status: DC
  Administered 2020-09-11 – 2020-10-03 (×23): 40 mg via SUBCUTANEOUS
  Filled 2020-09-11 (×24): qty 0.4

## 2020-09-11 NOTE — Patient Care Conference (Signed)
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 Genice Rouge Social Worker Present: Cecile Sheerer, LCSWA Nurse Present: Kennyth Arnold, RN PT Present: Peter Congo, PT OT Present: Ardis Rowan, COTA;Jennifer Katrinka Blazing, OT PPS Coordinator present : Fae Pippin, SLP     Current Status/Progress Goal Weekly Team Focus  Bowel/Bladder   incontinent b/b  regain continence      Swallow/Nutrition/ Hydration             ADL's   max A LB ADLs, CGA transfers with Stedy  min A to supervision overall  activity tolerance, sitting balance, BADL training, saftey awareness   Mobility   min A bed mobility, CGA transfer with stedy vs +2 with SB, gait in // bars min A, extensor tone in sitting  Supervision to min A overall, mod A stairs  LE NMR, standing and gait as able, sitting balance, transfers   Communication             Safety/Cognition/ Behavioral Observations            Pain   numbness and tingling  <3  assess pain q 4hr and prn   Skin   will not let staff look at feet  no new breakdown  assess skin q shift and prn     Discharge Planning:  D/c to home with pt husband who will possibly take LOA. Other potential supports mother or sister. SW will confirm no barriers to  discharge.   Team Discussion: Won't let anyone look at feet. Willing to do bowel program, decreased insulin. Has hand color changes, add something for spasticity. Incontinent B/B, has numbness and tingling. Have no idea how feet are. Contact guard/min assist bed mobility, +2 slide board and unsafe. Contact guard with the steady, gait attempted, but not safe. Has extensor tone and anxiety. Working towards min assist/contact guard with ADL's, currently Mod assist.  Patient on target to meet rehab goals: yes  *See Care Plan and progress notes for long and short-term goals.   Revisions to Treatment Plan:  Decreased insulin due to low BG.  Teaching Needs: Family education, medication management, pain management, skin/wound care, transfer training, gait training, balance training, endurance training, safety awareness.  Current Barriers to Discharge: Decreased caregiver support, Medical stability, Home enviroment access/layout, Incontinence, Neurogenic bowel and bladder, Wound care, Weight, Weight bearing restrictions, and Medication compliance  Possible Resolutions to Barriers: Continue current medications, provide emotional support.     Medical Summary Current Status: BG of 53 on 30 units 70/30- incontinent B/B;  pain tinlging and numbness- and will NOT let anyone assess feet-  Barriers to Discharge: Behavior;Decreased family/caregiver support;Home enviroment access/layout;Incontinence;Medical stability;Medication compliance;Neurogenic Bowel & Bladder;Weight bearing restrictions;Other (comments)  Barriers to Discharge Comments: need to clarify who will help when goes  home- cannot let us look at feet Possible Resolutions to [employer] Focus: extensor tone in legs and trunk - cannot transfer well- focus- B/B programs; a lot of anxiety; pretty limited/slow progress; max A (CGA with steady); d/c- 9/21   Continued Need for Acute Rehabilitation Level of Care: The patient requires daily medical  management by a physician with specialized training in physical medicine and rehabilitation for the following reasons: Direction of a multidisciplinary physical rehabilitation program to maximize functional independence : Yes Medical management of patient stability for increased activity during participation in an intensive rehabilitation regime.: Yes Analysis of laboratory values and/or radiology reports with any subsequent need for medication adjustment and/or medical intervention. : Yes   I attest that I was present, lead the team conference, and concur with the assessment and plan of the team.   Tennis Must 09/11/2020, 4:42 PM

## 2020-09-11 NOTE — Progress Notes (Signed)
Occupational Therapy Session Note  Patient Details  Name: Diane Willis MRN: [id number] Date of Birth: 08-26-1955  Today's Date: 09/11/2020 OT Individual Time: [phone] OT Individual Time Calculation (min): 40 min    Short Term Goals: Week 1:  OT Short Term Goal 1 (Week 1): Pt will sit EOB/EOM for 5 min with no more than MIN A dynamically OT Short Term Goal 2 (Week 1): pt will weight shift forward in mod ranges outside BOS wiht min VC for breathing to demo improved coping strategies OT Short Term Goal 3 (Week 1): Pt will don shirt with S OT Short Term Goal 4 (Week 1): Pt will sit to stand iwht LRAD and MAX A of 1 in prep for BADL   Skilled Therapeutic Interventions/Progress Updates:    Pt greeted at time of session semireclined in bed resting with husband present who remained throughout session. No pain reported despite pt sighing multiple times with movement but denied all pain. Focus of session on sitting unsupported EOB and unilateral support on bed rail having pt anterior weight shift 3x10, reaching FWD holding therapist hands to again promote anterior weight shift and weight bearing through BLEs on floor surface, and "walking" hands down legs but unable to go more than 2-3 inches past knee. Towel slides with feet on towel 2x15 to promote knee flexion and coordination. Sit <> stand in stedy CGA and transported to sink, able to reach for various personal items, reaching for paper towel, wiping mirror, etc to promote anterior weight shift and standing balance. Stedy > bed and sit > supine Supervision. Alarm on call bell in reach.   Therapy Documentation Precautions:  Precautions Precautions: Fall Precaution Comments: ataxic, sensation of falling with movement, anxious Restrictions Weight Bearing Restrictions: No    Therapy/Group: Individual Therapy  Erasmo Score 09/11/2020, 12:25 PM

## 2020-09-11 NOTE — Progress Notes (Signed)
Occupational Therapy Session Note  Patient Details  Name: Diane Willis MRN: [id number] Date of Birth: Dec 03, 1955  Today's Date: 09/11/2020 OT Individual Time: 1345-1424 OT Individual Time Calculation (min): 39 min    Short Term Goals: Week 1:  OT Short Term Goal 1 (Week 1): Pt will sit EOB/EOM for 5 min with no more than MIN A dynamically OT Short Term Goal 2 (Week 1): pt will weight shift forward in mod ranges outside BOS wiht min VC for breathing to demo improved coping strategies OT Short Term Goal 3 (Week 1): Pt will don shirt with S OT Short Term Goal 4 (Week 1): Pt will sit to stand iwht LRAD and MAX A of 1 in prep for BADL  Skilled Therapeutic Interventions/Progress Updates:    Pt resting in recliner upon arrival and agreeable to therapy. OT intervention with focus on sitting balance edge of recliner and managing BLE/trunk extensor tone. Some hip flexor tone/ataxia also noted when sitting with feet on floor. Attempted short intervals of sitting edge of chair x 8 with and without BUE support. Pt with increased anxiety when sitting upright edge of chair, especially without BUE support. PT able to scoot back into chair and flex knees to assist pushing with BLE elevated. Pt remained seated in recliner with all needs within reach.   Therapy Documentation Precautions:  Precautions Precautions: Fall Precaution Comments: ataxic, sensation of falling with movement, anxious Restrictions Weight Bearing Restrictions: No Pain:  Pt denies pain this afternoon  Therapy/Group: Individual Therapy  Rich Brave 09/11/2020, 2:32 PM

## 2020-09-11 NOTE — Progress Notes (Signed)
PROGRESS NOTE   Subjective/Complaints:  Pt admits still having bowel incontinence- we discussed bowel program- feels like just had one this AM.  Denies spasms.  Had BG of 53 this AM at 6am.   Hasn't gotten started on timed voiding yet  ROS:  Pt denies SOB, abd pain, CP, N/V/C/D, and vision changes   Objective:   No results found. Recent Labs    09/10/20 0619  WBC 10.4  HGB 8.7*  HCT 26.9*  PLT 227   Recent Labs    09/10/20 0619  NA 138  K 4.3  CL 107  CO2 24  GLUCOSE 128*  BUN 28*  CREATININE 1.17*  CALCIUM 8.9    Intake/Output Summary (Last 24 hours) at 09/11/2020 1024 Last data filed at 09/11/2020 0807 Gross per 24 hour  Intake 120 ml  Output --  Net 120 ml        Physical Exam: Vital Signs Blood pressure (!) 120/59, pulse 73, temperature 99.6 F (37.6 C), temperature source Oral, resp. rate 16, height 5\' 1"  (1.549 m), SpO2 100 %.    General: awake, alert, appropriate, sitting up in bed; thinks had bowel accident;  NAD HENT: conjugate gaze; oropharynx moist CV: regular rate; no JVD Pulmonary: CTA B/L; no W/R/R- good air movement GI: soft, NT, ND, (+)BS Psychiatric: appropriate but flat, anxious affect Neurological: Ox3  Neuro: Decreased to light touch in hands and below knees B/L  Musculoskeletal:     Cervical back: Normal range of motion. No rigidity.     Comments: RUE- 4/5 except FA 4/5 LUE- 4/5 except FA 4-/5 RLE- HF 3-/5, at least 3/5 otherwise- so painful, cannot tolerate me touching her feet/legs LLE- HF 3+/5 -otherwise at least 3/5  Skin:    Comments: Bilateral fingers and palms with discoloration and mild edema.  Slightly cool to touch Buttocks and privates- no skin breakdown  Will NOT let assess her feet/take socks off.   Assessment/Plan: 1. Functional deficits which require 3+ hours per day of interdisciplinary therapy in a comprehensive inpatient rehab  setting. Physiatrist is providing close team supervision and 24 hour management of active medical problems listed below. Physiatrist and rehab team continue to assess barriers to discharge/monitor patient progress toward functional and medical goals  Care Tool:  Bathing    Body parts bathed by patient: Right arm, Left arm, Chest, Abdomen, Front perineal area, Right upper leg, Left upper leg, Face   Body parts bathed by helper: Buttocks, Right lower leg, Left lower leg     Bathing assist Assist Level: Maximal Assistance - Patient 24 - 49%     Upper Body Dressing/Undressing Upper body dressing   What is the patient wearing?: Hospital gown only    Upper body assist Assist Level: Minimal Assistance - Patient > 75%    Lower Body Dressing/Undressing Lower body dressing      What is the patient wearing?: Pants, Incontinence brief     Lower body assist Assist for lower body dressing: Total Assistance - Patient < 25%     Toileting Toileting    Toileting assist Assist for toileting: Maximal Assistance - Patient 25 - 49% (Bedlevel)  Transfers Chair/bed transfer  Transfers assist     Chair/bed transfer assist level: Dependent - mechanical lift (stedy)     Locomotion Ambulation   Ambulation assist   Ambulation activity did not occur: Safety/medical concerns (Anxiety, BLE weakness, SCI)  Assist level: Minimal Assistance - Patient > 75% Assistive device: Parallel bars Max distance: 54ft   Walk 10 feet activity   Assist  Walk 10 feet activity did not occur: Safety/medical concerns (Anxiety, BLE weakness, SCI)        Walk 50 feet activity   Assist Walk 50 feet with 2 turns activity did not occur: Safety/medical concerns (Anxiety, BLE weakness, SCI)         Walk 150 feet activity   Assist Walk 150 feet activity did not occur: Safety/medical concerns (Anxiety, BLE weakness, SCI)         Walk 10 feet on uneven surface  activity   Assist Walk 10  feet on uneven surfaces activity did not occur: Safety/medical concerns (Anxiety, BLE weakness, SCI)         Wheelchair     Assist Is the patient using a wheelchair?: Yes Type of Wheelchair: Manual Wheelchair activity did not occur: Safety/medical concerns (High anxiety, fear-avoidance behavior)  Wheelchair assist level: Minimal Assistance - Patient > 75% Max wheelchair distance: 23ft    Wheelchair 50 feet with 2 turns activity    Assist    Wheelchair 50 feet with 2 turns activity did not occur: Safety/medical concerns (High anxiety, fear-avoidance behavior)       Wheelchair 150 feet activity     Assist  Wheelchair 150 feet activity did not occur: Safety/medical concerns (High anxiety, fear-avoidance behavior)       Blood pressure (!) 120/59, pulse 73, temperature 99.6 F (37.6 C), temperature source Oral, resp. rate 16, height 5\' 1"  (1.549 m), SpO2 100 %.  Medical Problem List and Plan: 1.  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  Antithrombotics: -DVT/anticoagulation:  Mechanical: Sequential compression devices, below knee Bilateral lower extremities since Plts are 66k 8/27- Plts up to 130k- if they stay up on Monday, will try Lovenox- esp with hx of PE. Dopplers (-) on 8/27 8/28- labs qmonday AM 8/30- plts up to 227k - will start Lovenox and monitor closely. Ordered qmonday/Thursday CBC-diff             -antiplatelet therapy: ASA             --H/o PE last year-- will check BLE dopplers with recent immobility/BLE weakness 3. Pain Management: Tylenol prn.  4. Mood: LCSW to follow for evaluation and support.              -antipsychotic agents: N/A 5. Neuropsych: This patient is capable of making decisions on his own behalf. 6. Skin/Wound Care: Routine pressure relief measures.  7.  Fluids/Electrolytes/Nutrition: Monitor I/O. Check lytes in am.  8.  T2DM: Hgb A1C-7.8--monitor BS ac/hs and use SSI for elevated Bs             --Continue 70/30 insulin 25 units bid.   8/27- BG's running 200s-300s- will increase 70/30 to 30 units BID  8/28- BG's now mid 100s since dose increased will give 1 more day before changing  again.   8/29: CBGs improved- continue current regimen.   8/30- had BG of 53 this AM- so will decrease 70/30 to 28 units 9. Vitamin B12: B12< 50.               --B12 1000 mcg daily X 7 days-->weekly for 7 weeks then monthly.  --lifelong B12 1000 mcg monthly supplement after loading doses.  10. 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 Thrombocytopenia: Platelets with steady drop 98 --> 68.             --was 149-171 range earlier this month and not on any heparin products.              --pancytopenia noted-->drop in WBC/HGB/plts over past year w/hypersegmented neutrophils --? Hematology consult.  8/27- look better- 130k- will recheck  Monday and if better, will wait on Heme consult   8/28- labs in AM  8/30- Plts up to 227k- started lovenox 11. Vitamin B1 deficiency: Thiamine 53.1--continue supplement.  12. Anxiety d/o: Continue Xanax prn.  13. 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  8/27- having accidents- of bowel and bladder- will d/w pt tomorrow about bowel program, and timed voiding  8/28- will order timed voiding q2 hours while awake. Although having bowel incontinence, not handling things well- will wait on bowel program for today.   8/29: had BM today  8/30- will start bowel program- d/w pt and she agreed; reminded staff that ordered timed voiding as well     I spent a total of 36 minute son total care- >50% coordination of care- d/w pt bowel program, d/w NT about timed voids and team conference.     LOS: 5 days A FACE TO FACE EVALUATION  WAS PERFORMED  Shalynn Jorstad 09/11/2020, 10:24 AM

## 2020-09-11 NOTE — Progress Notes (Signed)
Physical Therapy Session Note  Patient Details  Name: Diane Willis MRN: [id number] Date of Birth: 27-Nov-1955  Today's Date: 09/11/2020 PT Individual Time: 1000-1100; 1445-1515 PT Individual Time Calculation (min): 60 min and 30 min  Short Term Goals: Week 1:  PT Short Term Goal 1 (Week 1): Pt will perform sit <>supine w/min A PT Short Term Goal 2 (Week 1): Pt will perform sit <>stand w/mod A and LRAD PT Short Term Goal 3 (Week 1): Pt will perform bed <>chair transfers w/LRAD and mod A  Skilled Therapeutic Interventions/Progress Updates:    Session 1: Pt received seated in bed, agreeable to PT session. Pt is setup A to wash her face at bed level. Supine to sit with CGA for some trunk control, HOB elevated and use of bedrail. Stedy transfer to w/c with CGA. Pt is setup A to perform oral hygiene while seated in w/c at sink. Stedy transfer to/from mat table with CGA. Sit to stand with min A to RW with dycem under BLE to prevent sliding. Ambulation x 5 ft with RW and max A with close w/c follow from a 2nd person for safety. Pt has onset of extensor tone during gait as well as increase in anxiety and has near fall during gait, requires assist x 2 to safely sit back down in w/c. Pt's extensor tone and anxiety remain very limiting for her regarding progressing with functional mobility. Pt agreeable to sit up in recliner at end of session. Stedy transfer w/c to recliner with CGA. Pt left seated in recliner in room with needs in reach at end of session.  Session 2: Pt received seated in bed, agreeable to PT session, no complaints of pain. Setup patient's w/c rims with theraband for improved grip. Pt also requesting to change her socks. Attempt to have pt remove socks while in long-sitting position in bed, unable to fully reach due to ROM limitations. Assisted pt with doffing hospital socks, pt found to be wearing regular socks under hospital socks. Pt declines to don regular socks at this time and  requests new hospital socks be placed over regular socks. Education with patient regarding importance of maintaining skin integrity in her feet and importance of sock removal and allowing staff to assess her feet prior to d/c home. Pt agreeable to think about letting staff assess her feet at some point during hospital stay. Provided emotional support and encouragement. Pt then reports urinary incontinence, states she can occasionally tell when she needs to void but not all the time. Rolling L/R with mod A and use of bedrail for dependent brief change and pericare. Pt also found to be incontinent of bowel, unaware. Education regarding bowel program that will start tonight and importance of adherence to bowel program to train bowel and eventually prevent accidents. Pt states understanding of education. Pt left seated in bed with needs in reach, bed alarm in place.   Therapy Documentation Precautions:  Precautions Precautions: Fall Precaution Comments: ataxic, sensation of falling with movement, anxious Restrictions Weight Bearing Restrictions: No     Therapy/Group: Individual Therapy   Peter Congo, PT, DPT, CSRS  09/11/2020, 12:20 PM

## 2020-09-11 NOTE — Progress Notes (Signed)
Occupational Therapy Session Note  Patient Details  Name: Diane Willis MRN: [id number] Date of Birth: Sep 13, 1955  Today's Date: 09/11/2020 OT Individual Time: 1130-1155 OT Individual Time Calculation (min): 25 min    Short Term Goals: Week 1:  OT Short Term Goal 1 (Week 1): Pt will sit EOB/EOM for 5 min with no more than MIN A dynamically OT Short Term Goal 2 (Week 1): pt will weight shift forward in mod ranges outside BOS wiht min VC for breathing to demo improved coping strategies OT Short Term Goal 3 (Week 1): Pt will don shirt with S OT Short Term Goal 4 (Week 1): Pt will sit to stand iwht LRAD and MAX A of 1 in prep for BADL  Skilled Therapeutic Interventions/Progress Updates:    Pt resting in recliner upon arrival with CSW present. OT intervention with focus on discharge planning, role of OT, and safety awareness to increase independence with BADLs. Pt does not currently have personal clothing and has been wearing hospital gowns. Did not attempt sit<>stand but will attempt in later session. Home measurement sheet provided. Pt remained in relciner with all needs within reach.  Therapy Documentation Precautions:  Precautions Precautions: Fall Precaution Comments: ataxic, sensation of falling with movement, anxious Restrictions Weight Bearing Restrictions: No  Pain: Pain Assessment Pain Scale: 0-10 Pain Score: 0-No pain   Therapy/Group: Individual Therapy  Rich Brave 09/11/2020, 12:08 PM

## 2020-09-11 NOTE — Progress Notes (Signed)
Patient ID: Diane Willis, female   DOB: 1955-11-10, 65 y.o.   MRN: [id number]  SW met with pt in room to provide updates from team conference, and d/c date 9/21. SW informed pt will f/u with her husband to provide updates.   29- SW called pt husband to provide above updates. SW informed husband [name] [phone]) there will continue to be updates, and will f/u after team conference. SW encouraged f/u if needed.   Loralee Pacas, MSW, [place] Office: [phone] Cell: [phone] Fax: [phone]

## 2020-09-11 NOTE — Progress Notes (Signed)
Bowel program started. Dig stim performed. No stool returned. Suppository inserted. Pt placed on bedpan. Handoff given to night nurse. Mylo Red, LPN

## 2020-09-11 NOTE — Progress Notes (Signed)
Hypoglycemic Event  CBG: 53   Treatment: 8 oz juice/soda  Symptoms: None  Follow-up CBG: [id number]  CBG Result: 68  104  Possible Reasons for Event: Unknown     Diane Willis

## 2020-09-12 LAB — GLUCOSE, CAPILLARY
Glucose-Capillary: 160 mg/dL — ABNORMAL HIGH (ref 70–99)
Glucose-Capillary: 199 mg/dL — ABNORMAL HIGH (ref 70–99)
Glucose-Capillary: 234 mg/dL — ABNORMAL HIGH (ref 70–99)
Glucose-Capillary: 189 mg/dL — ABNORMAL HIGH (ref 70–99)

## 2020-09-12 NOTE — Progress Notes (Signed)
Occupational Therapy Session Note  Patient Details  Name: Diane Willis MRN: [id number] Date of Birth: 03/14/1955  Today's Date: 09/12/2020 OT Individual Time: 4098-1191 OT Individual Time Calculation (min): 70 min    Short Term Goals: Week 1:  OT Short Term Goal 1 (Week 1): Pt will sit EOB/EOM for 5 min with no more than MIN A dynamically OT Short Term Goal 2 (Week 1): pt will weight shift forward in mod ranges outside BOS wiht min VC for breathing to demo improved coping strategies OT Short Term Goal 3 (Week 1): Pt will don shirt with S OT Short Term Goal 4 (Week 1): Pt will sit to stand iwht LRAD and MAX A of 1 in prep for BADL  Skilled Therapeutic Interventions/Progress Updates:    Pt resting in recliner upon arrival. Pt reports that she already washed up earlier. Pt wearing hospital gown and does not have personal clothing in room. OT intervention with focus on static and dynamic sitting balance at edge of chair. Pt scoots forward and back into chair without assistance. Pt exhibits ability to place Bil feet on floor but, of note, she has difficulty placing heels flat on floor. 3" rise placed under feet to facilitate putting weight through heels when seated but pt still unable to place feet flat on rise without assistance. Pt with minimal ataxic BLE movements this morning with static sitting. Pt exhibits increased BLE ataxia when BUE engaged in simple BUE exercises. Pt very anxious when asked to lean forward when sitting in chair and hold onto OTA's hands. Pt asked to lean forward in recliner to chair placed in front of her. Pt with increased ease with repetition but still with s/s on increased anxiety. Pt remained in recliner with all needs within reach and belt alarm activated.   Therapy Documentation Precautions:  Precautions Precautions: Fall Precaution Comments: ataxic, sensation of falling with movement, anxious Restrictions Weight Bearing Restrictions: No  Pain: Pain  Assessment Pain Scale: 0-10 Pain Score: 0-No pain   Therapy/Group: Individual Therapy  Rich Brave 09/12/2020, 12:04 PM

## 2020-09-12 NOTE — Progress Notes (Signed)
Occupational Therapy Session Note  Patient Details  Name: Diane Willis MRN: [id number] Date of Birth: 12/11/1955  Today's Date: 09/12/2020 OT Individual Time: 4166-0630 OT Individual Time Calculation (min): 43 min    Short Term Goals: Week 1:  OT Short Term Goal 1 (Week 1): Pt will sit EOB/EOM for 5 min with no more than MIN A dynamically OT Short Term Goal 2 (Week 1): pt will weight shift forward in mod ranges outside BOS wiht min VC for breathing to demo improved coping strategies OT Short Term Goal 3 (Week 1): Pt will don shirt with S OT Short Term Goal 4 (Week 1): Pt will sit to stand iwht LRAD and MAX A of 1 in prep for BADL   Skilled Therapeutic Interventions/Progress Updates:    Pt greeted at time of session sitting up in recliner agreeable to OT session, no c/o pain throughout. Stedy recliner > wheelchair with CGA to stand in stedy throughout. Wheelchair transport > gym with extended time d/t very fearful of falling and attempted to have pt rest hands on knees but unable to tolerance for long and placed on handles for comfort during transport. Extensor tone present and limiting throughout session. With feet on step block, pt engaged in 1x10 of the following w/ 2kg ball: chest press, overhead press, and twist with minimal movment noted, physical assist for weight bearing through feet on block to prevent extension. Reaching and crossing midline 3 rounds of placing cone from R > L side encouraging pt to reach FWD and anterior weight shift. Fear of falling and extensor tone significantly limiting. Stedy transfer back to recliner alarm on call bell in reach.   Therapy Documentation Precautions:  Precautions Precautions: Fall Precaution Comments: ataxic, sensation of falling with movement, anxious Restrictions Weight Bearing Restrictions: No    Therapy/Group: Individual Therapy  Erasmo Score 09/12/2020, 7:20 AM

## 2020-09-12 NOTE — Progress Notes (Signed)
PROGRESS NOTE   Subjective/Complaints:  Rare nerve pain- doesn't want meds for it.    Hasn't gotten an idea if timed voiding helping or not.  Had good BM with bowel program last night- went OK  ROS:  Pt denies SOB, abd pain, CP, N/V/C/D, and vision changes   Objective:   No results found. Recent Labs    09/10/20 0619  WBC 10.4  HGB 8.7*  HCT 26.9*  PLT 227   Recent Labs    09/10/20 0619  NA 138  K 4.3  CL 107  CO2 24  GLUCOSE 128*  BUN 28*  CREATININE 1.17*  CALCIUM 8.9    Intake/Output Summary (Last 24 hours) at 09/12/2020 1853 Last data filed at 09/12/2020 1330 Gross per 24 hour  Intake 240 ml  Output --  Net 240 ml        Physical Exam: Vital Signs Blood pressure (!) 134/56, pulse 80, temperature 99.5 F (37.5 C), temperature source Oral, resp. rate 14, height 5\' 1"  (1.549 m), SpO2 99 %.     General: awake, alert, appropriate, laying in bed; different socks on feet- still won't less assess; NAD HENT: conjugate gaze; oropharynx moist CV: regular rate; no JVD Pulmonary: CTA B/L; no W/R/R- good air movement GI: soft, NT, ND, (+)BS Psychiatric: appropriate Neurological: Ox3; flat  Neuro: Decreased to light touch in hands and below knees B/L  Musculoskeletal:     Cervical back: Normal range of motion. No rigidity.     Comments: RUE- 4/5 except FA 4/5 LUE- 4/5 except FA 4-/5 RLE- HF 3-/5, at least 3/5 otherwise- so painful, cannot tolerate me touching her feet/legs LLE- HF 3+/5 -otherwise at least 3/5  Skin:    Comments: Bilateral fingers and palms with discoloration and mild edema.  Slightly cool to touch Buttocks and privates- no skin breakdown  Will NOT let us assess her feet/take socks off.   Assessment/Plan: 1. Functional deficits which require 3+ hours per day of interdisciplinary therapy in a comprehensive inpatient rehab setting. Physiatrist is providing close team  supervision and 24 hour management of active medical problems listed below. Physiatrist and rehab team continue to assess barriers to discharge/monitor patient progress toward functional and medical goals  Care Tool:  Bathing    Body parts bathed by patient: Right arm, Left arm, Chest, Abdomen, Front perineal area, Right upper leg, Left upper leg, Face   Body parts bathed by helper: Buttocks, Right lower leg, Left lower leg     Bathing assist Assist Level: Maximal Assistance - Patient 24 - 49%     Upper Body Dressing/Undressing Upper body dressing   What is the patient wearing?: Hospital gown only    Upper body assist Assist Level: Minimal Assistance - Patient > 75%    Lower Body Dressing/Undressing Lower body dressing      What is the patient wearing?: Pants, Incontinence brief     Lower body assist Assist for lower body dressing: Total Assistance - Patient < 25%     Toileting Toileting    Toileting assist Assist for toileting: Maximal Assistance - Patient 25 - 49% (Bedlevel)     Transfers Chair/bed transfer  Transfers  assist     Chair/bed transfer assist level: Dependent - mechanical lift (stedy)     Locomotion Ambulation   Ambulation assist   Ambulation activity did not occur: Safety/medical concerns (Anxiety, BLE weakness, SCI)  Assist level: 2 helpers Assistive device: Walker-rolling Max distance: 5'   Walk 10 feet activity   Assist  Walk 10 feet activity did not occur: Safety/medical concerns (Anxiety, BLE weakness, SCI)        Walk 50 feet activity   Assist Walk 50 feet with 2 turns activity did not occur: Safety/medical concerns (Anxiety, BLE weakness, SCI)         Walk 150 feet activity   Assist Walk 150 feet activity did not occur: Safety/medical concerns (Anxiety, BLE weakness, SCI)         Walk 10 feet on uneven surface  activity   Assist Walk 10 feet on uneven surfaces activity did not occur: Safety/medical concerns  (Anxiety, BLE weakness, SCI)         Wheelchair     Assist Is the patient using a wheelchair?: Yes Type of Wheelchair: Manual Wheelchair activity did not occur: Safety/medical concerns (High anxiety, fear-avoidance behavior)  Wheelchair assist level: Minimal Assistance - Patient > 75% Max wheelchair distance: 74ft    Wheelchair 50 feet with 2 turns activity    Assist    Wheelchair 50 feet with 2 turns activity did not occur: Safety/medical concerns (High anxiety, fear-avoidance behavior)       Wheelchair 150 feet activity     Assist  Wheelchair 150 feet activity did not occur: Safety/medical concerns (High anxiety, fear-avoidance behavior)       Blood pressure (!) 134/56, pulse 80, temperature 99.5 F (37.5 C), temperature source Oral, resp. rate 14, height 5\' 1"  (1.549 m), SpO2 99 %.  Medical Problem List and Plan: 1.  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  Antithrombotics: -DVT/anticoagulation:  Mechanical: Sequential compression devices, below knee Bilateral lower extremities since Plts are 66k 8/27- Plts up to 130k- if they stay up on Monday, will try Lovenox- esp with hx of PE. Dopplers (-) on 8/27 8/28- labs qmonday AM 8/30- plts up to 227k - will start Lovenox and monitor closely. Ordered qmonday/Thursday CBC-diff             -antiplatelet therapy: ASA             --H/o PE last year-- will check BLE dopplers with recent immobility/BLE weakness 3. Pain Management: Tylenol prn.  4. Mood: LCSW to follow for evaluation and support.              -antipsychotic agents: N/A 5. Neuropsych: This patient is capable of making decisions on his own behalf. 6. Skin/Wound Care: Routine pressure relief measures.  7. Fluids/Electrolytes/Nutrition: Monitor I/O. Check lytes in am.  8.  T2DM: Hgb  A1C-7.8--monitor BS ac/hs and use SSI for elevated Bs             --Continue 70/30 insulin 25 units bid.   8/27- BG's running 200s-300s- will increase 70/30 to 30 units BID  8/28- BG's now mid 100s since dose increased will give 1 more day before changing again.   8/29: CBGs improved- continue current regimen.  8/30- had BG of 53 this AM- so will decrease 70/30 to 28 units  8/31- BG's 160-234- will give 1 more day to settle out- con't for now 9. Vitamin B12: B12< 50.               --B12 1000 mcg daily X 7 days-->weekly for 7 weeks then monthly.  --lifelong B12 1000 mcg monthly supplement after loading doses.  10. 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 Thrombocytopenia: Platelets with steady drop 98 --> 68.             --was 149-171 range earlier this month and not on any heparin products.              --pancytopenia noted-->drop in WBC/HGB/plts over past year w/hypersegmented neutrophils --? Hematology consult.  8/27- look better- 130k- will recheck  Monday and if better, will wait on Heme consult   8/28- labs in AM  8/30- Plts up to 227k- started lovenox 11. Vitamin B1 deficiency: Thiamine 53.1--continue supplement.  12. Anxiety d/o: Continue Xanax prn.  13. 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  8/27- having accidents- of bowel and bladder- will d/w pt tomorrow about bowel program, and timed voiding  8/28- will order timed voiding q2 hours while awake. Although having bowel incontinence, not handling things well- will wait on bowel program for today.   8/29: had BM today  8/30- will start bowel program- d/w pt and she agreed; reminded staff that ordered timed voiding as well    8/31- bowel program helpful last night- will con't  LOS: 6 days A FACE TO FACE EVALUATION WAS PERFORMED  Deondrick Searls 09/12/2020, 6:53 PM

## 2020-09-12 NOTE — Progress Notes (Signed)
Physical Therapy Session Note  Patient Details  Name: Diane Willis MRN: [id number] Date of Birth: 1955-08-07  Today's Date: 09/12/2020 PT Individual Time: 2633-3545 PT Individual Time Calculation (min): 60 min   Short Term Goals: Week 1:  PT Short Term Goal 1 (Week 1): Pt will perform sit <>supine w/min A PT Short Term Goal 2 (Week 1): Pt will perform sit <>stand w/mod A and LRAD PT Short Term Goal 3 (Week 1): Pt will perform bed <>chair transfers w/LRAD and mod A  Skilled Therapeutic Interventions/Progress Updates: Pt presented in bed agreeable to therapy. Pt denies pain at start of session. Pt requesting to wash up with PTA setting up wash basin and pt washing face, UE, and front peri-area on own. Pt performed rolling L/R with supervision and verbal cues to bed contralateral leg to side rolling to to facilitate roll. PTA assisting with buttocks and washing back to ensure cleanliness. Pt also performed rolling L/R in same manner to allow PTA to don brief total A. Pt then performed supine to sit with CGA and use of bed features. Performed STS CGA in [place] and transported to w/c. ELR placed and pillow placed behind pt's back to decrease extensor tone. Pt then set up to brush teeth at sink with objects placed in such manner requiring pt to perform anterior leans to reach objects. Once completed pt transfer to rehab gym with PTA encouraging pt to sit upright and look at LE so that can see if one slides off leg rest. In parallel bars pt performed STS with CGA and ambulated forward/backwards 58fx 4. Pt transported back to room at end of session and performed Stedy transfer to recliner. Pt left in recliner at end of session with belt alarm on, call bell within reach and needs met.      Therapy Documentation Precautions:  Precautions Precautions: Fall Precaution Comments: ataxic, sensation of falling with movement, anxious Restrictions Weight Bearing Restrictions: No General:   Vital  Signs:  Pain:   Mobility:   Locomotion :    Trunk/Postural Assessment :    Balance:   Exercises:   Other Treatments:      Therapy/Group: Individual Therapy  Maddox Hlavaty 09/12/2020, 2:30 PM

## 2020-09-12 NOTE — Progress Notes (Signed)
Physical Therapy Session Note  Patient Details  Name: Diane Willis MRN: [id number] Date of Birth: 02-24-1955  Today's Date: 09/12/2020 PT Individual Time: 1500-1540 PT Individual Time Calculation (min): 40 min   Short Term Goals: Week 1:  PT Short Term Goal 1 (Week 1): Pt will perform sit <>supine w/min A PT Short Term Goal 2 (Week 1): Pt will perform sit <>stand w/mod A and LRAD PT Short Term Goal 3 (Week 1): Pt will perform bed <>chair transfers w/LRAD and mod A  Skilled Therapeutic Interventions/Progress Updates:    Pt received in recliner and agreeable to therapy.  No complaint of pain. Session focused on facilitating anterior weight shifting while seated on edge of recliner. Pt's anxiety managed with deep breathing throughout session. Progressed to being able to lean forward to being her shoulder to therapist's opposite shoulder. Therapist provided block at shins and deep pressure through knees to decr extensor tone and facilitate weight shift. Pt reported extreme fatigue and requested to return to bed after session. Stedy transfer with CGA to return to bed. CGA bed mobility, including scooting to EOB with assist to stabilize feet. Pt remained in bed at end of session and was left with all needs in reach and alarm active.   Therapy Documentation Precautions:  Precautions Precautions: Fall Precaution Comments: ataxic, sensation of falling with movement, anxious Restrictions Weight Bearing Restrictions: No    Therapy/Group: Individual Therapy  Juluis Rainier 09/12/2020, 4:02 PM

## 2020-09-13 LAB — CBC WITH DIFFERENTIAL/PLATELET
Abs Immature Granulocytes: 0.08 10*3/uL — ABNORMAL HIGH (ref 0.00–0.07)
Basophils Absolute: 0.1 10*3/uL (ref 0.0–0.1)
Basophils Relative: 1 %
Eosinophils Absolute: 0.4 10*3/uL (ref 0.0–0.5)
Eosinophils Relative: 4 %
HCT: 25.6 % — ABNORMAL LOW (ref 36.0–46.0)
Hemoglobin: 8.2 g/dL — ABNORMAL LOW (ref 12.0–15.0)
Immature Granulocytes: 1 %
Lymphocytes Relative: 16 %
Lymphs Abs: 1.6 10*3/uL (ref 0.7–4.0)
MCH: 35 pg — ABNORMAL HIGH (ref 26.0–34.0)
MCHC: 32 g/dL (ref 30.0–36.0)
MCV: 109.4 fL — ABNORMAL HIGH (ref 80.0–100.0)
Monocytes Absolute: 1.1 10*3/uL — ABNORMAL HIGH (ref 0.1–1.0)
Monocytes Relative: 11 %
Neutro Abs: 6.8 10*3/uL (ref 1.7–7.7)
Neutrophils Relative %: 67 %
Platelets: 290 10*3/uL (ref 150–400)
RBC: 2.34 MIL/uL — ABNORMAL LOW (ref 3.87–5.11)
RDW: 14.9 % (ref 11.5–15.5)
WBC: 10 10*3/uL (ref 4.0–10.5)
nRBC: 0 % (ref 0.0–0.2)

## 2020-09-13 LAB — GLUCOSE, CAPILLARY
Glucose-Capillary: 213 mg/dL — ABNORMAL HIGH (ref 70–99)
Glucose-Capillary: 187 mg/dL — ABNORMAL HIGH (ref 70–99)
Glucose-Capillary: 124 mg/dL — ABNORMAL HIGH (ref 70–99)
Glucose-Capillary: 158 mg/dL — ABNORMAL HIGH (ref 70–99)

## 2020-09-13 MED ORDER — INSULIN ASPART PROT & ASPART (70-30 MIX) 100 UNIT/ML ~~LOC~~ SUSP
29.0000 [IU] | Freq: Two times a day (BID) | SUBCUTANEOUS | Status: DC
  Administered 2020-09-13 – 2020-09-15 (×4): 29 [IU] via SUBCUTANEOUS

## 2020-09-13 NOTE — Progress Notes (Signed)
Physical Therapy Session Note  Patient Details  Name: ABBAGAIL Willis MRN: [id number] Date of Birth: March 17, 1955  Today's Date: 09/13/2020 PT Individual Time: 0835-1000 and 1400-1505 PT Individual Time Calculation (min): 85 min and 65 min  Short Term Goals: Week 1:  PT Short Term Goal 1 (Week 1): Pt will perform sit <>supine w/min A PT Short Term Goal 2 (Week 1): Pt will perform sit <>stand w/mod A and LRAD PT Short Term Goal 3 (Week 1): Pt will perform bed <>chair transfers w/LRAD and mod A  Skilled Therapeutic Interventions/Progress Updates: Pt presented in bed agreeable to therapy. Pt denies pain during session. Pt requesting to wash face however pt encouraging pt to transfer to w/c prior to washing face/brushing teeth. PTA provided edu that IPR is for progression to home and functionally would not wash face/brush teeth in bed, pt verbalized understanding. Pt noted to be incontinent, pt performed rolling L/R with use of bed rail and supervision to allow PTA to doff/don brief and perform peri-care for time management. Pt then performed supine to sit with supervision and increased time and performed Stedy transfer to w/c at Supervision. Pt then performed oral hygiene at sink with set up and pt noted to be able to increase forward lean towards sink with decreased encouragement. Pt then transported to rehab gym and participated in gait in parallel bars 61f x 4 with PTA placing 2lb cuff on RLE for increased proprioceptive feedback. Pt then performed toe taps to 4 in step 2 x 5 R/L each then alternating with PTA noting intermittent stepping on own foot. After seated rest pt then repeated activity with yoga block between feet to improve BOS. Pt then transported back to room and performed Stedy transfer to recliner with CGA. Pt left in recliner at end of session with belt alarm on, call bell within reach and needs met.   Tx2: Pt presented in recliner with husband Diane Drummerpresent agreeable to therapy. Diane Willis brought in new sneakers which were tested and fit with laces loosened. Pt denies pain but states has incontinent brief. Pt states has not been toileted since PTA previous session. PTA set up brief, scrub pants, and shoes then pt performed STS in Stedy with CGA and PTA performed peri-care and changed brief total A. Pt then transferred to w/c via Stedy. Pt transported to [facility]hab gym and [facility]. Pt performed sit to stand x 4 from elevated mat then at end of session performed STS from lowered mat. Pt noted to pull self up with RW and educated that this session will allow to build up confident however will enforce proper technique (pushing up from stable surface) going forward with pt verbalizing understanding. During x1 standing bout pt performed standing marches to facilitate weight shifting with mild ataxic movement in RLE noted. Pt then participated in reaching forward onto physioball to encourage anterior weight shifting. Pt with increased anxiety during this activity initially however improved with repetition. During second bout of this activity pt stated felt more comfortable but still nervous. Pt  then performed Stedy transfer to w/c and transported back to room. Performed Stedy transfer back to recliner per pt request. Pt left in recliner at end of session with belt alarm on, call bell within reach and needs met.      Therapy Documentation Precautions:  Precautions Precautions: Fall Precaution Comments: ataxic, sensation of falling with movement, anxious Restrictions Weight Bearing Restrictions: No General:   Vital Signs:  Pain: Pain Assessment Pain  Scale: 0-10 Pain Score: 0-No pain    Therapy/Group: Individual Therapy  Diane Willis Diane Willis, PTA  09/13/2020, 10:32 AM

## 2020-09-13 NOTE — Progress Notes (Signed)
PROGRESS NOTE   Subjective/Complaints:  Pt reports they did bowel program last night- isn't documented of note; and had BM.  Pt said "timed voiding working", however per PT, who was in room, pt still wet a lot-  Isn't getting PVRs, so will order to make sure not retaining and having overflow incontinence.   ROS:  Pt denies SOB, abd pain, CP, N/V/C/D, and vision changes    Objective:   No results found. Recent Labs    09/13/20 0509  WBC 10.0  HGB 8.2*  HCT 25.6*  PLT 290   No results for input(s): NA, K, CL, CO2, GLUCOSE, BUN, CREATININE, CALCIUM in the last 72 hours.   Intake/Output Summary (Last 24 hours) at 09/13/2020 1040 Last data filed at 09/13/2020 0745 Gross per 24 hour  Intake 240 ml  Output --  Net 240 ml        Physical Exam: Vital Signs Blood pressure (!) 149/58, pulse 83, temperature 98.7 F (37.1 C), temperature source Oral, resp. rate 14, height 5\' 1"  (1.549 m), SpO2 100 %.     General: awake, alert, appropriate, flat; laying in bed supine; NAD HENT: conjugate gaze; oropharynx moist CV: regular rate; no JVD Pulmonary: CTA B/L; no W/R/R- good air movement GI: soft, NT, ND, (+)BS Psychiatric: appropriate Neurological: alert Decreased to light touch in hands and below knees B/L - in depends- incontinent of urine.  Musculoskeletal:     Cervical back: Normal range of motion. No rigidity.     Comments: RUE- 4/5 except FA 4/5 LUE- 4/5 except FA 4-/5 RLE- HF 3-/5, at least 3/5 otherwise- so painful, cannot tolerate me touching her feet/legs LLE- HF 3+/5 -otherwise at least 3/5  Skin:    Comments: Bilateral fingers and palms with discoloration and mild edema.  Slightly cool to touch Buttocks and privates- no skin breakdown  Will NOT let assess her feet/take socks off.  Everyday, try to check- pt refuses. Wearing different socks today.   Assessment/Plan: 1. Functional deficits which require  3+ hours per day of interdisciplinary therapy in a comprehensive inpatient rehab setting. Physiatrist is providing close team supervision and 24 hour management of active medical problems listed below. Physiatrist and rehab team continue to assess barriers to discharge/monitor patient progress toward functional and medical goals  Care Tool:  Bathing    Body parts bathed by patient: Right arm, Left arm, Chest, Abdomen, Front perineal area, Right upper leg, Left upper leg, Face   Body parts bathed by helper: Buttocks, Right lower leg, Left lower leg     Bathing assist Assist Level: Maximal Assistance - Patient 24 - 49%     Upper Body Dressing/Undressing Upper body dressing   What is the patient wearing?: Hospital gown only    Upper body assist Assist Level: Minimal Assistance - Patient > 75%    Lower Body Dressing/Undressing Lower body dressing      What is the patient wearing?: Pants, Incontinence brief     Lower body assist Assist for lower body dressing: Total Assistance - Patient < 25%     Toileting Toileting    Toileting assist Assist for toileting: Maximal Assistance - Patient 25 -  49% (Bedlevel)     Transfers Chair/bed transfer  Transfers assist     Chair/bed transfer assist level: Dependent - mechanical lift (stedy)     Locomotion Ambulation   Ambulation assist   Ambulation activity did not occur: Safety/medical concerns (Anxiety, BLE weakness, SCI)  Assist level: 2 helpers Assistive device: Walker-rolling Max distance: 5'   Walk 10 feet activity   Assist  Walk 10 feet activity did not occur: Safety/medical concerns (Anxiety, BLE weakness, SCI)        Walk 50 feet activity   Assist Walk 50 feet with 2 turns activity did not occur: Safety/medical concerns (Anxiety, BLE weakness, SCI)         Walk 150 feet activity   Assist Walk 150 feet activity did not occur: Safety/medical concerns (Anxiety, BLE weakness, SCI)         Walk  10 feet on uneven surface  activity   Assist Walk 10 feet on uneven surfaces activity did not occur: Safety/medical concerns (Anxiety, BLE weakness, SCI)         Wheelchair     Assist Is the patient using a wheelchair?: Yes Type of Wheelchair: Manual Wheelchair activity did not occur: Safety/medical concerns (High anxiety, fear-avoidance behavior)  Wheelchair assist level: Minimal Assistance - Patient > 75% Max wheelchair distance: 40ft    Wheelchair 50 feet with 2 turns activity    Assist    Wheelchair 50 feet with 2 turns activity did not occur: Safety/medical concerns (High anxiety, fear-avoidance behavior)       Wheelchair 150 feet activity     Assist  Wheelchair 150 feet activity did not occur: Safety/medical concerns (High anxiety, fear-avoidance behavior)       Blood pressure (!) 149/58, pulse 83, temperature 98.7 F (37.1 C), temperature source Oral, resp. rate 14, height 5\' 1"  (1.549 m), SpO2 100 %.  Medical Problem List and Plan: 1.  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  Antithrombotics: -DVT/anticoagulation:  Mechanical: Sequential compression devices, below knee Bilateral lower extremities since Plts are 66k 8/27- Plts up to 130k- if they stay up on Monday, will try Lovenox- esp with hx of PE. Dopplers (-) on 8/27 8/28- labs qmonday AM 8/30- plts up to 227k - will start Lovenox and monitor closely. Ordered qmonday/Thursday CBC-diff 9/1- Plts 290k- doing much better             -antiplatelet therapy: ASA             --H/o PE last year-- will check BLE dopplers with recent immobility/BLE weakness 3. Pain Management: Tylenol prn.  4. Mood: LCSW to follow for evaluation and support.              -antipsychotic agents: N/A 5. Neuropsych: This patient is capable of making  decisions on his own behalf. 6. Skin/Wound Care: Routine pressure relief measures.  7. Fluids/Electrolytes/Nutrition: Monitor I/O. Check lytes in am.  8.  T2DM: Hgb A1C-7.8--monitor BS ac/hs and use SSI for elevated Bs             --Continue 70/30 insulin 25 units bid.   8/27- BG's running 200s-300s- will increase 70/30 to 30 units BID  8/28- BG's now mid 100s since dose increased  will give 1 more day before changing again.   8/29: CBGs improved- continue current regimen.   8/30- had BG of 53 this AM- so will decrease 70/30 to 28 units  8/31- BG's 160-234- will give 1 more day to settle out- con't for now  9/1- BG's 160-213- will increase to 29 units- at 30 units, had very low BG 2 days ago in AM 9. Vitamin B12: B12< 50.               --B12 1000 mcg daily X 7 days-->weekly for 7 weeks then monthly.  --lifelong B12 1000 mcg monthly supplement after loading doses.  10. 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 Thrombocytopenia: Platelets with steady drop 98 --> 68.             --was 149-171 range earlier this month and not on any heparin products.              --pancytopenia noted-->drop in WBC/HGB/plts over past year w/hypersegmented neutrophils --? Hematology consult.  8/27- look better- 130k- will recheck  Monday and if better, will wait on Heme consult   8/28- labs in AM  8/30- Plts up to 227k- started lovenox 11. Vitamin B1 deficiency: Thiamine 53.1--continue supplement.  12. Anxiety d/o: Continue Xanax prn.  13. 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  8/27- having accidents- of bowel and bladder- will d/w pt tomorrow about bowel program, and timed voiding  8/28- will order timed voiding q2 hours while awake. Although having bowel incontinence, not handling things well- will wait on bowel program for today.   8/29: had BM today  8/30- will start bowel program- d/w pt and  she agreed; reminded staff that ordered timed voiding as well    8/31- bowel program helpful last night- will con't  9/1- will start PVR's to make sure emptying when voids- and con't bowel program- had BM at 11pm last night, but unclear if bowel program done.   LOS: 7 days A FACE TO FACE EVALUATION WAS PERFORMED  Cecile Guevara 09/13/2020, 10:40 AM

## 2020-09-13 NOTE — Progress Notes (Signed)
Occupational Therapy Session Note  Patient Details  Name: Diane Willis MRN: [id number] Date of Birth: [birth year]  Today's Date: 09/13/2020 OT Individual Time: 1100-1155 OT Individual Time Calculation (min): 55 min    Short Term Goals: Week 2:  OT Short Term Goal 1 (Week 2): Pt will don shirt with S OT Short Term Goal 2 (Week 2): Pt will sit to stand iwht LRAD and MAX A of 1 in prep for BADL OT Short Term Goal 3 (Week 2): Pt will complete LB bathing with mod A OT Short Term Goal 4 (Week 2): Pt will complete LB dressing with mod A  Skilled Therapeutic Interventions/Progress Updates:    Pt resting in recliner upon arrival and agreeable to therapy. Pt reports that she already washed up and is only wearing hospital gowns at present. OT intervention with focus on dynamic sitting balace while engaging BUE in tasks. Focus on leaning forward with increase hip flexion and stabilizing BLE. Pt becomes very anxious with all dynamic movements nut improving when using relaxation techniques. Pt engaged in BUE diagonal movements/therex with yellow theraband with focus on trunk and BLE stabilizations. Pt remained in recliner with all needs within reach and belt alarm activated.  Therapy Documentation Precautions:  Precautions Precautions: Fall Precaution Comments: ataxic, sensation of falling with movement, anxious Restrictions Weight Bearing Restrictions: No  Pain: Pain Assessment Pain Scale: 0-10 Pain Score: 0-No pain   Therapy/Group: Individual Therapy  Rich Brave 09/13/2020, 12:12 PM

## 2020-09-13 NOTE — Progress Notes (Signed)
Occupational Therapy Weekly Progress Note  Patient Details  Name: Diane Willis MRN: [id number] Date of Birth: 1955-06-19  Beginning of progress report period: September 07, 2020 End of progress report period: September 13, 2020   Patient has met 2 of 4 short term goals.  Pt is making slow progress with BADLs, sitting balance, and functional transfers. Pt requires setup/supervision for UB bathing tasks and max A for LB bathing tasks. Pt does not currently have any personal clothing items and is wearing hospital gowns. Transfers with Stedy. Pt exhibits increased trunk/BLE extensor tone when engaged in all dynamic sitting tasks. Pt with increased anxiety during dynamic sitting activities utilizing BUE. Some BLE atxia noted during dynamic and static sitting tasks. Pt declines to doff socks during her hospital stay.  Patient continues to demonstrate the following deficits: muscle weakness, decreased cardiorespiratoy endurance, impaired timing and sequencing, abnormal tone, unbalanced muscle activation, ataxia, and decreased coordination, and decreased sitting balance, decreased standing balance, decreased postural control, and decreased balance strategies and therefore will continue to benefit from skilled OT intervention to enhance overall performance with BADL and Reduce care partner burden.  Patient progressing toward long term goals..  Continue plan of care.  OT Short Term Goals Week 1:  OT Short Term Goal 1 (Week 1): Pt will sit EOB/EOM for 5 min with no more than MIN A dynamically OT Short Term Goal 1 - Progress (Week 1): Met OT Short Term Goal 2 (Week 1): pt will weight shift forward in mod ranges outside BOS wiht min VC for breathing to demo improved coping strategies OT Short Term Goal 2 - Progress (Week 1): Met OT Short Term Goal 3 (Week 1): Pt will don shirt with S OT Short Term Goal 3 - Progress (Week 1): Progressing toward goal OT Short Term Goal 4 (Week 1): Pt will sit to stand iwht  LRAD and MAX A of 1 in prep for BADL OT Short Term Goal 4 - Progress (Week 1): Progressing toward goal Week 2:  OT Short Term Goal 1 (Week 2): Pt will don shirt with S OT Short Term Goal 2 (Week 2): Pt will sit to stand iwht LRAD and MAX A of 1 in prep for BADL OT Short Term Goal 3 (Week 2): Pt will complete LB bathing with mod A OT Short Term Goal 4 (Week 2): Pt will complete LB dressing with mod A  [name] 09/13/2020, 6:56 AM

## 2020-09-14 LAB — GLUCOSE, CAPILLARY
Glucose-Capillary: 152 mg/dL — ABNORMAL HIGH (ref 70–99)
Glucose-Capillary: 66 mg/dL — ABNORMAL LOW (ref 70–99)
Glucose-Capillary: 98 mg/dL (ref 70–99)
Glucose-Capillary: 221 mg/dL — ABNORMAL HIGH (ref 70–99)
Glucose-Capillary: 153 mg/dL — ABNORMAL HIGH (ref 70–99)

## 2020-09-14 NOTE — Plan of Care (Signed)
  Problem: Consults Goal: RH SPINAL CORD INJURY PATIENT EDUCATION Description:  See Patient Education module for education specifics.  Outcome: Progressing Goal: Diabetes Guidelines if Diabetic/Glucose > 140 Description: If diabetic or lab glucose is > 140 mg/dl - Initiate Diabetes/Hyperglycemia Guidelines & Document Interventions  Outcome: Progressing   Problem: SCI BOWEL ELIMINATION Goal: RH STG MANAGE BOWEL WITH ASSISTANCE Description: STG Manage Bowel with min Assistance. Outcome: Progressing   Problem: SCI BLADDER ELIMINATION Goal: RH STG MANAGE BLADDER WITH ASSISTANCE Description: STG Manage Bladder With min Assistance Outcome: Progressing   Problem: RH SAFETY Goal: RH STG ADHERE TO SAFETY PRECAUTIONS W/ASSISTANCE/DEVICE Description: STG Adhere to Safety Precautions With min Assistance/Device. Outcome: Progressing Goal: RH STG DECREASED RISK OF FALL WITH ASSISTANCE Description: STG Decreased Risk of Fall With min Assistance. Outcome: Progressing   Problem: RH KNOWLEDGE DEFICIT SCI Goal: RH STG INCREASE KNOWLEDGE OF SELF CARE AFTER SCI Description: Patient will demonstrate knowledge of medication management, bowel/bladder management, weight bearing precautions with educational materials and handouts provided by staff independently at discharge. Outcome: Progressing   

## 2020-09-14 NOTE — Progress Notes (Signed)
Nurse presented to patient for bowel program patient still in chair and not ready to start program. Receiving nurse inform to proceed with program. Cletis Media, LPN

## 2020-09-14 NOTE — Progress Notes (Signed)
PROGRESS NOTE   Subjective/Complaints:  Pt reports having results with bowel program- d/w nurse that it isn't documented.  Also getting some results with timed voiding- PVR- last was 166cc- per nurse, haven't required a cath.    ROS:  Pt denies SOB, abd pain, CP, N/V/C/D, and vision changes    Objective:   No results found. Recent Labs    09/13/20 0509  WBC 10.0  HGB 8.2*  HCT 25.6*  PLT 290   No results for input(s): NA, K, CL, CO2, GLUCOSE, BUN, CREATININE, CALCIUM in the last 72 hours.   Intake/Output Summary (Last 24 hours) at 09/14/2020 0856 Last data filed at 09/14/2020 0837 Gross per 24 hour  Intake 450 ml  Output --  Net 450 ml        Physical Exam: Vital Signs Blood pressure (!) 123/51, pulse (!) 57, temperature 98.7 F (37.1 C), temperature source Oral, resp. rate 18, height 5\' 1"  (1.549 m), SpO2 99 %.      General: awake, alert, appropriate, sitting up in bed taking meds- nurse in room;  NAD HENT: conjugate gaze; oropharynx moist CV: regular rate- on the edge of bradycardia; no JVD Pulmonary: CTA B/L; no W/R/R- good air movement GI: soft, NT, ND, (+)BS Psychiatric: appropriate; flat; smiles, but listless slightly Neurological: Ox3 Decreased to light touch in hands and below knees B/L - in depends- incontinent of urine.  Musculoskeletal:     Cervical back: Normal range of motion. No rigidity.     Comments: RUE- 4/5 except FA 4/5 LUE- 4/5 except FA 4-/5 RLE- HF 3-/5, at least 3/5 otherwise- so painful, cannot tolerate me touching her feet/legs LLE- HF 3+/5 -otherwise at least 3/5  Skin:    Comments: Bilateral fingers and palms with discoloration and mild edema.  Slightly cool to touch Buttocks and privates- no skin breakdown  Will NOT let assess her feet/take socks off.  Everyday, try to check- pt refuses. Wearing different socks today.   Assessment/Plan: 1. Functional deficits which  require 3+ hours per day of interdisciplinary therapy in a comprehensive inpatient rehab setting. Physiatrist is providing close team supervision and 24 hour management of active medical problems listed below. Physiatrist and rehab team continue to assess barriers to discharge/monitor patient progress toward functional and medical goals  Care Tool:  Bathing    Body parts bathed by patient: Right arm, Left arm, Chest, Abdomen, Front perineal area, Right upper leg, Left upper leg, Face   Body parts bathed by helper: Buttocks, Right lower leg, Left lower leg     Bathing assist Assist Level: Maximal Assistance - Patient 24 - 49%     Upper Body Dressing/Undressing Upper body dressing   What is the patient wearing?: Hospital gown only    Upper body assist Assist Level: Minimal Assistance - Patient > 75%    Lower Body Dressing/Undressing Lower body dressing      What is the patient wearing?: Pants, Incontinence brief     Lower body assist Assist for lower body dressing: Total Assistance - Patient < 25%     Toileting Toileting    Toileting assist Assist for toileting: Maximal Assistance - Patient 25 -  49% (Bedlevel)     Transfers Chair/bed transfer  Transfers assist     Chair/bed transfer assist level: Dependent - mechanical lift (stedy)     Locomotion Ambulation   Ambulation assist   Ambulation activity did not occur: Safety/medical concerns (Anxiety, BLE weakness, SCI)  Assist level: 2 helpers Assistive device: Walker-rolling Max distance: 5'   Walk 10 feet activity   Assist  Walk 10 feet activity did not occur: Safety/medical concerns (Anxiety, BLE weakness, SCI)        Walk 50 feet activity   Assist Walk 50 feet with 2 turns activity did not occur: Safety/medical concerns (Anxiety, BLE weakness, SCI)         Walk 150 feet activity   Assist Walk 150 feet activity did not occur: Safety/medical concerns (Anxiety, BLE weakness, SCI)          Walk 10 feet on uneven surface  activity   Assist Walk 10 feet on uneven surfaces activity did not occur: Safety/medical concerns (Anxiety, BLE weakness, SCI)         Wheelchair     Assist Is the patient using a wheelchair?: Yes Type of Wheelchair: Manual Wheelchair activity did not occur: Safety/medical concerns (High anxiety, fear-avoidance behavior)  Wheelchair assist level: Minimal Assistance - Patient > 75% Max wheelchair distance: 75ft    Wheelchair 50 feet with 2 turns activity    Assist    Wheelchair 50 feet with 2 turns activity did not occur: Safety/medical concerns (High anxiety, fear-avoidance behavior)       Wheelchair 150 feet activity     Assist  Wheelchair 150 feet activity did not occur: Safety/medical concerns (High anxiety, fear-avoidance behavior)       Blood pressure (!) 123/51, pulse (!) 57, temperature 98.7 F (37.1 C), temperature source Oral, resp. rate 18, height 5\' 1"  (1.549 m), SpO2 99 %.  Medical Problem List and Plan: 1.  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  Antithrombotics: -DVT/anticoagulation:  Mechanical: Sequential compression devices, below knee Bilateral lower extremities since Plts are 66k 8/27- Plts up to 130k- if they stay up on Monday, will try Lovenox- esp with hx of PE. Dopplers (-) on 8/27 8/28- labs qmonday AM 8/30- plts up to 227k - will start Lovenox and monitor closely. Ordered qmonday/Thursday CBC-diff 9/1- Plts 290k- doing much better             -antiplatelet therapy: ASA             --H/o PE last year-- will check BLE dopplers with recent immobility/BLE weakness 3. Pain Management: Tylenol prn.  4. Mood: LCSW to follow for evaluation and support.              -antipsychotic agents: N/A 5. Neuropsych: This patient is capable of  making decisions on his own behalf. 6. Skin/Wound Care: Routine pressure relief measures.  7. Fluids/Electrolytes/Nutrition: Monitor I/O. Check lytes in am.  8.  T2DM: Hgb A1C-7.8--monitor BS ac/hs and use SSI for elevated Bs             --Continue 70/30 insulin 25 units bid.   8/27- BG's running 200s-300s- will increase 70/30 to 30 units BID  9/1- BG's 160-213- will increase to 29  units- at 30 units, had very low BG 2 days ago in AM  9/2- BG's 124- 187- improved- con't regimen 9. Vitamin B12: B12< 50.               --B12 1000 mcg daily X 7 days-->weekly for 7 weeks then monthly.  --lifelong B12 1000 mcg monthly supplement after loading doses.  10. 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 Thrombocytopenia: Platelets with steady drop 98 --> 68.             --was 149-171 range earlier this month and not on any heparin products.              --pancytopenia noted-->drop .   8/30- Plts up to 227k- started lovenox  9/2 plts 290k on lovenox 11. Vitamin B1 deficiency: Thiamine 53.1--continue supplement.  12. Anxiety d/o: Continue Xanax prn.  13. 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  8/27- having accidents- of bowel and bladder- will d/w pt tomorrow about bowel program, and timed voiding  8/28- will order timed voiding q2 hours while awake.  having bowel incontinence,   9/2- tolerating bowel program- but not documented- let nursing know- and timed voiding helping- PVR 166cc x1  LOS: 8 days A FACE TO FACE EVALUATION WAS PERFORMED  Diane Willis 09/14/2020, 8:56 AM

## 2020-09-14 NOTE — Progress Notes (Signed)
Physical Therapy Weekly Progress Note  Patient Details  Name: Diane Willis MRN: [id number] Date of Birth: 1955-04-27  Beginning of progress report period: September 07, 2020 End of progress report period: September 14, 2020  Today's Date: 09/16/2020  Patient has met 3 of 3 short term goals.  Pt has made slow but steady gains during current course of therapy. Pt continues to be limited by extensor tone and anxiety however pt is able to perform STS from mat with RW and increased encouragement and time, bed mobility with supervision and use of bed features and gait training in parallel bars. Pt continues to work on anterior weight shifting to progress functional mobility.   Patient continues to demonstrate the following deficits muscle weakness and muscle paralysis and impaired timing and sequencing, abnormal tone, unbalanced muscle activation, ataxia, and decreased coordination and therefore will continue to benefit from skilled PT intervention to increase functional independence with mobility.  Patient progressing toward long term goals..  Continue plan of care.  PT Short Term Goals Week 1:  PT Short Term Goal 1 (Week 1): Pt will perform sit <>supine w/min A PT Short Term Goal 1 - Progress (Week 1): Met PT Short Term Goal 2 (Week 1): Pt will perform sit <>stand w/mod A and LRAD PT Short Term Goal 2 - Progress (Week 1): Met PT Short Term Goal 3 (Week 1): Pt will perform bed <>chair transfers w/LRAD and mod A PT Short Term Goal 3 - Progress (Week 1): Met Week 2:  PT Short Term Goal 1 (Week 2): Pt will perform least restrictive transfer with min A PT Short Term Goal 2 (Week 2): Pt will initiate gait training outside of // bars PT Short Term Goal 3 (Week 2): Pt will perform w/c mobility x 50 ft PT Short Term Goal 4 (Week 2): Pt will tolerate sitting up OOB x 2 hours  Therapy Documentation Precautions:  Precautions Precautions: Fall Precaution Comments: ataxic, sensation of falling with  movement, anxious Restrictions Weight Bearing Restrictions: No   Therapy/Group: Individual Therapy  Excell Seltzer 09/16/2020, 7:53 AM

## 2020-09-14 NOTE — Progress Notes (Signed)
Hypoglycemic Event  CBG: 66  Treatment: Patient ate and received lunch  Symptoms: None  Follow-up CBG: Time:1321 CBG Result:98  Possible Reasons for Event: Unknown  Comments/MD notified:No new orders    Diane Willis Diane Willis

## 2020-09-14 NOTE — Progress Notes (Signed)
Occupational Therapy Session Note  Patient Details  Name: Diane Willis MRN: [id number] Date of Birth: 1955-06-23  Today's Date: 09/14/2020 OT Individual Time: 6999-6722 OT Individual Time Calculation (min): 69 min    Short Term Goals: Week 1:  OT Short Term Goal 1 (Week 1): Pt will sit EOB/EOM for 5 min with no more than MIN A dynamically OT Short Term Goal 1 - Progress (Week 1): Met OT Short Term Goal 2 (Week 1): pt will weight shift forward in mod ranges outside BOS wiht min VC for breathing to demo improved coping strategies OT Short Term Goal 2 - Progress (Week 1): Met OT Short Term Goal 3 (Week 1): Pt will don shirt with S OT Short Term Goal 3 - Progress (Week 1): Progressing toward goal OT Short Term Goal 4 (Week 1): Pt will sit to stand iwht LRAD and MAX A of 1 in prep for BADL OT Short Term Goal 4 - Progress (Week 1): Progressing toward goal  Skilled Therapeutic Interventions/Progress Updates:    Pt resting in bed upon arrival and agreeable to getting OOB to wash and change hospital gown. Pt requested to was LB at bed level requiring min A to doff/don incontinence brief. Pt completed bathing portion without assistance. Supine>sit EOB with supervision, although increased anxiety with transitional movements. Transfer to w/c with Stedy. Sit<>stand in [place] with CGA. Pt completed UB bathing w/c level at sink with setup. Pt again demonstrates increased anxiety when leaning forward in w/c to access items on sink. Pt completed UB bathing tasks without assistance.  Pt anxious about staying in w/c unattended when OTA offered to leave room to get more cups. Transfer to recliner with Stedy. All needs within reach and belt alarm activated.   Therapy Documentation Precautions:  Precautions Precautions: Fall Precaution Comments: ataxic, sensation of falling with movement, anxious Restrictions Weight Bearing Restrictions: No  Pain: Pain Assessment Pain Scale: 0-10 Pain Score: 0-No  pain  Therapy/Group: Individual Therapy  [name] 09/14/2020, 9:27 AM

## 2020-09-14 NOTE — Progress Notes (Signed)
Physical Therapy Session Note  Patient Details  Name: Diane Willis MRN: [id number] Date of Birth: 03-May-1955  Today's Date: 09/14/2020 PT Individual Time: 1040-1135 1315-1400 PT Individual Time Calculation (min): 55 min and 45 min  Short Term Goals: Week 1:  PT Short Term Goal 1 (Week 1): Pt will perform sit <>supine w/min A PT Short Term Goal 1 - Progress (Week 1): Met PT Short Term Goal 2 (Week 1): Pt will perform sit <>stand w/mod A and LRAD PT Short Term Goal 2 - Progress (Week 1): Met PT Short Term Goal 3 (Week 1): Pt will perform bed <>chair transfers w/LRAD and mod A PT Short Term Goal 3 - Progress (Week 1): Met  Skilled Therapeutic Interventions/Progress Updates: Tx2: Pt presented in recliner with NT present to perform bladder scan agreeable to therapy. Pt denies pain and agreeable to change into scrub pants/top. PTA threaded pants and donned shoes total A for time management. Performed STS in [place] and PTA pulled pants over hips. Pt transported to w/c and pt donned scrub top seated in w/c with Stedy in front of pt. Pt then transported to patio at [facility] entrance. PTA discussed current level of function with pt and inquired about methods that work with her to decrease anxiety with activities. Pt then participated in AROM BLE all planes with emphasis on controlled movement x 10 bilaterally. Pt transported back to room and performed transfer back to recliner with Stedy in same manner as prior. Pt left in recliner at end of session with belt alarm on, call bell within reach and husband, [name] present.   Tx2: Pt presented in recliner eating lunch, requesting additional time to complete meal. PTA returned 15 min later with pt agreeable to participate. PTA donned shoes and pt performed Stedy transfer to w/c with CGA for STS in [place]. Pt transported to rehab gym and performed Stedy transfer to high/low mat in same manner. Pt required increased time once Stedy removed due to increased anxiety with  PTA providing calming encouragement and cues for placing feet on floor and placing hands beside trunk vs posteriorly. Pt educated in using UE on stable surface when performing STS and was able to perform STS x 5 with CGA and verbal cues for hand placement. Pt participated in standing task activity of reaching for cards and placing on back of mirror to encourage forward weight shifting. Pt with some increased anxiety during task however was able to complete. Pt returned to w/c via Stedy and transported back to room. Performed Stedy transfer back to recliner. Pt left in recliner at end of session with seat alarm on, call bell within reach and needs met.       Therapy Documentation Precautions:  Precautions Precautions: Fall Precaution Comments: ataxic, sensation of falling with movement, anxious Restrictions Weight Bearing Restrictions: No General:   Vital Signs:  Pain: Pain Assessment Pain Scale: 0-10 Pain Score: 0-No pain   Therapy/Group: Individual Therapy  Rorey Bisson Maysin Carstens, PTA  09/14/2020, 12:29 PM

## 2020-09-14 NOTE — Progress Notes (Signed)
Patient had large bowel movement after the completion of bowel program

## 2020-09-15 LAB — GLUCOSE, CAPILLARY
Glucose-Capillary: 152 mg/dL — ABNORMAL HIGH (ref 70–99)
Glucose-Capillary: 124 mg/dL — ABNORMAL HIGH (ref 70–99)
Glucose-Capillary: 164 mg/dL — ABNORMAL HIGH (ref 70–99)

## 2020-09-15 MED ORDER — INSULIN ASPART PROT & ASPART (70-30 MIX) 100 UNIT/ML ~~LOC~~ SUSP
30.0000 [IU] | Freq: Two times a day (BID) | SUBCUTANEOUS | Status: DC
  Administered 2020-09-15 – 2020-09-20 (×10): 30 [IU] via SUBCUTANEOUS
  Filled 2020-09-15: qty 10

## 2020-09-15 NOTE — Progress Notes (Signed)
PROGRESS NOTE   Subjective/Complaints: She reports no complaints this morning "I'm good" Tomeka notes she complained of headache earlier this morning   ROS:  Pt denies SOB, abd pain, CP, N/V/C/D, and vision changes, +headache    Objective:   No results found. Recent Labs    09/13/20 0509  WBC 10.0  HGB 8.2*  HCT 25.6*  PLT 290   No results for input(s): NA, K, CL, CO2, GLUCOSE, BUN, CREATININE, CALCIUM in the last 72 hours.   Intake/Output Summary (Last 24 hours) at 09/15/2020 1311 Last data filed at 09/15/2020 0745 Gross per 24 hour  Intake 340 ml  Output --  Net 340 ml        Physical Exam: Vital Signs Blood pressure 132/65, pulse 71, temperature 99 F (37.2 C), resp. rate 16, height 5\' 1"  (1.549 m), SpO2 100 %. Gen: no distress, normal appearing HEENT: oral mucosa pink and moist, NCAT Cardio: Reg rate Chest: normal effort, normal rate of breathing Abd: soft, non-distended Ext: no edema Psychiatric: appropriate; flat; smiles, but listless slightly Neurological: Ox3 Decreased to light touch in hands and below knees B/L - in depends- incontinent of urine.  Musculoskeletal:     Cervical back: Normal range of motion. No rigidity.     Comments: RUE- 4/5 except FA 4/5 LUE- 4/5 except FA 4-/5 RLE- HF 3-/5, at least 3/5 otherwise- so painful, cannot tolerate me touching her feet/legs LLE- HF 3+/5 -otherwise at least 3/5  Skin:    Comments: Bilateral fingers and palms with discoloration and mild edema.  Slightly cool to touch Buttocks and privates- no skin breakdown  Will NOT let assess her feet/take socks off.  Everyday, try to check- pt refuses. Wearing different socks today.   Assessment/Plan: 1. Functional deficits which require 3+ hours per day of interdisciplinary therapy in a comprehensive inpatient rehab setting. Physiatrist is providing close team supervision and 24 hour management of active  medical problems listed below. Physiatrist and rehab team continue to assess barriers to discharge/monitor patient progress toward functional and medical goals  Care Tool:  Bathing    Body parts bathed by patient: Right arm, Left arm, Chest, Abdomen, Front perineal area, Right upper leg, Left upper leg (bed level for LB and seated in w/c for UB)   Body parts bathed by helper: Buttocks, Right lower leg, Left lower leg Body parts n/a: Right lower leg, Left lower leg (pt refuses to remove socks)   Bathing assist Assist Level: Maximal Assistance - Patient 24 - 49%     Upper Body Dressing/Undressing Upper body dressing   What is the patient wearing?: Hospital gown only    Upper body assist Assist Level: Minimal Assistance - Patient > 75%    Lower Body Dressing/Undressing Lower body dressing      What is the patient wearing?: Incontinence brief     Lower body assist Assist for lower body dressing: Total Assistance - Patient < 25%     Toileting Toileting    Toileting assist Assist for toileting: Maximal Assistance - Patient 25 - 49% (Bedlevel)     Transfers Chair/bed transfer  Transfers assist     Chair/bed transfer assist level:  Dependent - mechanical lift (stedy)     Locomotion Ambulation   Ambulation assist   Ambulation activity did not occur: Safety/medical concerns (Anxiety, BLE weakness, SCI)  Assist level: 2 helpers Assistive device: Walker-rolling Max distance: 5'   Walk 10 feet activity   Assist  Walk 10 feet activity did not occur: Safety/medical concerns (Anxiety, BLE weakness, SCI)        Walk 50 feet activity   Assist Walk 50 feet with 2 turns activity did not occur: Safety/medical concerns (Anxiety, BLE weakness, SCI)         Walk 150 feet activity   Assist Walk 150 feet activity did not occur: Safety/medical concerns (Anxiety, BLE weakness, SCI)         Walk 10 feet on uneven surface  activity   Assist Walk 10 feet on  uneven surfaces activity did not occur: Safety/medical concerns (Anxiety, BLE weakness, SCI)         Wheelchair     Assist Is the patient using a wheelchair?: Yes Type of Wheelchair: Manual Wheelchair activity did not occur: Safety/medical concerns (High anxiety, fear-avoidance behavior)  Wheelchair assist level: Minimal Assistance - Patient > 75% Max wheelchair distance: 18ft    Wheelchair 50 feet with 2 turns activity    Assist    Wheelchair 50 feet with 2 turns activity did not occur: Safety/medical concerns (High anxiety, fear-avoidance behavior)       Wheelchair 150 feet activity     Assist  Wheelchair 150 feet activity did not occur: Safety/medical concerns (High anxiety, fear-avoidance behavior)       Blood pressure 132/65, pulse 71, temperature 99 F (37.2 C), resp. rate 16, height 5\' 1"  (1.549 m), SpO2 100 %.  Medical Problem List and Plan: 1.  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  Antithrombotics: -DVT/anticoagulation:  Mechanical: Sequential compression devices, below knee Bilateral lower extremities since Plts are 66k 8/27- Plts up to 130k- if they stay up on Monday, will try Lovenox- esp with hx of PE. Dopplers (-) on 8/27 8/28- labs qmonday AM 8/30- plts up to 227k - will start Lovenox and monitor closely. Ordered qmonday/Thursday CBC-diff 9/1- Plts 290k- doing much better             -antiplatelet therapy: ASA             --H/o PE last year-- will check BLE dopplers with recent immobility/BLE weakness 3. Pain Management: Tylenol prn.  4. Mood: LCSW to follow for evaluation and support.              -antipsychotic agents: N/A 5. Neuropsych: This patient is capable of making decisions on his own behalf. 6. Skin/Wound Care: Routine pressure relief measures.  7.  Fluids/Electrolytes/Nutrition: Monitor I/O. Check lytes in am.  8.  T2DM: Hgb A1C-7.8--monitor BS ac/hs and use SSI for elevated Bs             --Continue 70/30 insulin 25 units bid.   8/27- BG's running 200s-300s- will increase 70/30 to 30 units BID  9/1- BG's 160-213- will increase to 29 units- at 30 units, had very low BG 2 days ago in AM  9/2- BG's 124- 187- improved- con't regimen  9/3: CBGs 98-221:  increase insulin to 30U 9. Vitamin B12: B12< 50.               --Continue B12 1000 mcg daily X 7 days-->weekly for 7 weeks then monthly.  --lifelong B12 1000 mcg monthly supplement after loading doses.  10. 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 Thrombocytopenia: Platelets with steady drop 98 --> 68.             --was 149-171 range earlier this month and not on any heparin products.              --pancytopenia noted-->drop .   8/30- Plts up to 227k- started lovenox  9/2 plts 290k on lovenox 11. Vitamin B1 deficiency: Thiamine 53.1--continue supplement.  12. Anxiety d/o: Continue Xanax prn.  13. 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  8/27- having accidents- of bowel and bladder- will d/w pt tomorrow about bowel program, and timed voiding  8/28- will order timed voiding q2 hours while awake.  having bowel incontinence,   9/2- tolerating bowel program- but not documented- let nursing know- and timed voiding helping- PVR 166cc x1  LOS: 9 days A FACE TO FACE EVALUATION WAS PERFORMED  Santrice Muzio P Karolyne Timmons 09/15/2020, 1:11 PM

## 2020-09-15 NOTE — Plan of Care (Signed)
  Problem: Consults Goal: RH SPINAL CORD INJURY PATIENT EDUCATION Description:  See Patient Education module for education specifics.  Outcome: Progressing Goal: Diabetes Guidelines if Diabetic/Glucose > 140 Description: If diabetic or lab glucose is > 140 mg/dl - Initiate Diabetes/Hyperglycemia Guidelines & Document Interventions  Outcome: Progressing   Problem: SCI BOWEL ELIMINATION Goal: RH STG MANAGE BOWEL WITH ASSISTANCE Description: STG Manage Bowel with min Assistance. Outcome: Progressing   Problem: SCI BLADDER ELIMINATION Goal: RH STG MANAGE BLADDER WITH ASSISTANCE Description: STG Manage Bladder With min Assistance Outcome: Progressing   Problem: RH SAFETY Goal: RH STG ADHERE TO SAFETY PRECAUTIONS W/ASSISTANCE/DEVICE Description: STG Adhere to Safety Precautions With min Assistance/Device. Outcome: Progressing Goal: RH STG DECREASED RISK OF FALL WITH ASSISTANCE Description: STG Decreased Risk of Fall With min Assistance. Outcome: Progressing   Problem: RH KNOWLEDGE DEFICIT SCI Goal: RH STG INCREASE KNOWLEDGE OF SELF CARE AFTER SCI Description: Patient will demonstrate knowledge of medication management, bowel/bladder management, weight bearing precautions with educational materials and handouts provided by staff independently at discharge. Outcome: Progressing   

## 2020-09-15 NOTE — Progress Notes (Signed)
Patient bowel program started at 1830 patient had nothing in chamber. No bowel present at interval. Suppository introduced at 1845. Next shift to continue with bowel program. Cletis Media, LPN

## 2020-09-16 LAB — GLUCOSE, CAPILLARY
Glucose-Capillary: 80 mg/dL (ref 70–99)
Glucose-Capillary: 70 mg/dL (ref 70–99)
Glucose-Capillary: 283 mg/dL — ABNORMAL HIGH (ref 70–99)
Glucose-Capillary: 146 mg/dL — ABNORMAL HIGH (ref 70–99)

## 2020-09-16 NOTE — Progress Notes (Signed)
Physical Therapy Session Note  Patient Details  Name: Diane Willis MRN: [id number] Date of Birth: [birth year]  Today's Date: 09/16/2020 PT Individual Time: 1400-1440 PT Individual Time Calculation (min): 40 min   Short Term Goals: Week 2:  PT Short Term Goal 1 (Week 2): Pt will perform least restrictive transfer with min A PT Short Term Goal 2 (Week 2): Pt will initiate gait training outside of // bars PT Short Term Goal 3 (Week 2): Pt will perform w/c mobility x 50 ft PT Short Term Goal 4 (Week 2): Pt will tolerate sitting up OOB x 2 hours  Skilled Therapeutic Interventions/Progress Updates:  Pt received seated in recliner in room, agreeable to PT session. No complaints of pain. Pt agreeable to wear tennis shoes this session, agreeable to let therapist doff hospital socks with regular socks underneath, continues to refuse assessment of feet. Assisted pt with donning shoes over regular socks. Sit to stand with min A to RW x 10 reps. Pt with posterior lean in standing and flexed hips/trunk, able to correct with cueing. Standing sidesteps to the R to center on recliner chair, assist needed to move RW, knees blocked in stance. Standing alt L/R marches 3 x 10 reps with RW and min A for balance. Pt remains fearful of falling with standing activity but improved from previous week. Pt does continue to exhibit extensor tone but again improved from previous week. Assisted pt with doffing tennis shoes and donning hospital socks over regular socks. Pt left seated in recliner in room with needs in reach, quick release belt and chair alarm in place at end of session.  Therapy Documentation Precautions:  Precautions Precautions: Fall Precaution Comments: ataxic, sensation of falling with movement, anxious Restrictions Weight Bearing Restrictions: No     Therapy/Group: Individual Therapy   Peter Congo, PT, DPT, CSRS  09/16/2020, 5:26 PM

## 2020-09-16 NOTE — Progress Notes (Signed)
Physical Therapy Session Note  Patient Details  Name: Diane Willis MRN: [id number] Date of Birth: [birth year]  Today's Date: 09/16/2020 PT Individual Time: 1120-1215 PT Individual Time Calculation (min): 55 min   Short Term Goals: Week 2:  PT Short Term Goal 1 (Week 2): Pt will perform least restrictive transfer with min A PT Short Term Goal 2 (Week 2): Pt will initiate gait training outside of // bars PT Short Term Goal 3 (Week 2): Pt will perform w/c mobility x 50 ft PT Short Term Goal 4 (Week 2): Pt will tolerate sitting up OOB x 2 hours  Skilled Therapeutic Interventions/Progress Updates:    Pt received in bed and agreeable to therapy. At start of session, pt requested to change clothing. Pt required MinA for donning of shirt and ModA for donning of pants. Further session focused on functional transfers, balance, and activity tolerance. Pt then transferred to EOB with CGA. From EOB pt transferred into WC with CGA + STEDY. Pt then transferred to [facility]. Pt transferred to therapy gym for time conservation. In gym, pt had difficulty with sit to stand from [facility] 2/2 elevation of cushion. Thus, pt returned to room and to practice functional transfers and balance activities from recliner to optimize remaining time. Pt completed 4 sit to stands with CGA + STEDY, followed by balance practice of single-finger hold for 4 rounds of 2 mins. Pt then practiced 8 quick sit to stand from recliner, completing in <1 min. Pt practiced standing marches in STEDY with ModA and weight shifting with MinA, both in STEDY. At end of session, pt was left seated in recliner with call bell, alarm engaged, and all needs in reach.  Therapy Documentation Precautions:  Precautions Precautions: Fall Precaution Comments: ataxic, sensation of falling with movement, anxious Restrictions Weight Bearing Restrictions: No    Therapy/Group: Individual Therapy  Perrin Maltese, PT, DPT 09/16/2020, 4:22 PM

## 2020-09-16 NOTE — Plan of Care (Signed)
  Problem: Consults Goal: RH SPINAL CORD INJURY PATIENT EDUCATION Description:  See Patient Education module for education specifics.  Outcome: Progressing Goal: Diabetes Guidelines if Diabetic/Glucose > 140 Description: If diabetic or lab glucose is > 140 mg/dl - Initiate Diabetes/Hyperglycemia Guidelines & Document Interventions  Outcome: Progressing   Problem: SCI BOWEL ELIMINATION Goal: RH STG MANAGE BOWEL WITH ASSISTANCE Description: STG Manage Bowel with min Assistance. Outcome: Progressing   Problem: SCI BLADDER ELIMINATION Goal: RH STG MANAGE BLADDER WITH ASSISTANCE Description: STG Manage Bladder With min Assistance Outcome: Progressing   Problem: RH SAFETY Goal: RH STG ADHERE TO SAFETY PRECAUTIONS W/ASSISTANCE/DEVICE Description: STG Adhere to Safety Precautions With min Assistance/Device. Outcome: Progressing Goal: RH STG DECREASED RISK OF FALL WITH ASSISTANCE Description: STG Decreased Risk of Fall With min Assistance. Outcome: Progressing   Problem: RH KNOWLEDGE DEFICIT SCI Goal: RH STG INCREASE KNOWLEDGE OF SELF CARE AFTER SCI Description: Patient will demonstrate knowledge of medication management, bowel/bladder management, weight bearing precautions with educational materials and handouts provided by staff independently at discharge. Outcome: Progressing

## 2020-09-16 NOTE — Progress Notes (Signed)
Patient states that she has ringing in both ears. Presented suddenly. Patient states that she did not put anything in her ears and that she only cleaned them with a wash cloth. Cletis Media, LPN

## 2020-09-16 NOTE — Progress Notes (Signed)
Patient bowel program started at 1845. No stool in chamber. Patient suppository introduced at 1855. Patient did not have bowel movement. Patient states that she is having bowel sensation. And states that she feels uncomfortable. Cletis Media, LPN

## 2020-09-17 LAB — BASIC METABOLIC PANEL
Anion gap: 10 (ref 5–15)
BUN: 20 mg/dL (ref 8–23)
CO2: 24 mmol/L (ref 22–32)
Calcium: 9.4 mg/dL (ref 8.9–10.3)
Chloride: 105 mmol/L (ref 98–111)
Creatinine, Ser: 1.03 mg/dL — ABNORMAL HIGH (ref 0.44–1.00)
GFR, Estimated: >60 mL/min (ref >60)
Glucose, Bld: 119 mg/dL — ABNORMAL HIGH (ref 70–99)
Potassium: 4.9 mmol/L (ref 3.5–5.1)
Sodium: 139 mmol/L (ref 135–145)

## 2020-09-17 LAB — CBC WITH DIFFERENTIAL/PLATELET
Abs Immature Granulocytes: 0.04 10*3/uL (ref 0.00–0.07)
Basophils Absolute: 0.1 10*3/uL (ref 0.0–0.1)
Basophils Relative: 1 %
Eosinophils Absolute: 0.4 10*3/uL (ref 0.0–0.5)
Eosinophils Relative: 4 %
HCT: 28.7 % — ABNORMAL LOW (ref 36.0–46.0)
Hemoglobin: 9.1 g/dL — ABNORMAL LOW (ref 12.0–15.0)
Immature Granulocytes: 1 %
Lymphocytes Relative: 20 %
Lymphs Abs: 1.7 10*3/uL (ref 0.7–4.0)
MCH: 34.3 pg — ABNORMAL HIGH (ref 26.0–34.0)
MCHC: 31.7 g/dL (ref 30.0–36.0)
MCV: 108.3 fL — ABNORMAL HIGH (ref 80.0–100.0)
Monocytes Absolute: 0.9 10*3/uL (ref 0.1–1.0)
Monocytes Relative: 10 %
Neutro Abs: 5.5 10*3/uL (ref 1.7–7.7)
Neutrophils Relative %: 64 %
Platelets: 262 10*3/uL (ref 150–400)
RBC: 2.65 MIL/uL — ABNORMAL LOW (ref 3.87–5.11)
RDW: 14.1 % (ref 11.5–15.5)
WBC: 8.5 10*3/uL (ref 4.0–10.5)
nRBC: 0 % (ref 0.0–0.2)

## 2020-09-17 LAB — GLUCOSE, CAPILLARY
Glucose-Capillary: 135 mg/dL — ABNORMAL HIGH (ref 70–99)
Glucose-Capillary: 90 mg/dL (ref 70–99)
Glucose-Capillary: 328 mg/dL — ABNORMAL HIGH (ref 70–99)
Glucose-Capillary: 217 mg/dL — ABNORMAL HIGH (ref 70–99)

## 2020-09-17 MED ORDER — BACLOFEN 10 MG PO TABS
10.0000 mg | ORAL_TABLET | Freq: Three times a day (TID) | ORAL | Status: DC
  Administered 2020-09-17 – 2020-10-03 (×49): 10 mg via ORAL
  Filled 2020-09-17 (×49): qty 1

## 2020-09-17 NOTE — Progress Notes (Signed)
Physical Therapy Session Note  Patient Details  Name: Diane Willis MRN: [id number] Date of Birth: 1955/02/21  Today's Date: 09/17/2020 PT Individual Time: 0800-0900; 1430-1540 PT Individual Time Calculation (min): 60 min and 70 min  Short Term Goals: Week 2:  PT Short Term Goal 1 (Week 2): Pt will perform least restrictive transfer with min A PT Short Term Goal 2 (Week 2): Pt will initiate gait training outside of // bars PT Short Term Goal 3 (Week 2): Pt will perform w/c mobility x 50 ft PT Short Term Goal 4 (Week 2): Pt will tolerate sitting up OOB x 2 hours  Skilled Therapeutic Interventions/Progress Updates:    Session 1: Pt received seated in w/c handed off from OT. No complaints of pain this date. Dependent transport to therapy gym via w/c. Sit to stand with Supervision in stedy. Stedy transfer to mat table. In unsupported sitting pt has severe anxiety due to fear of falling, therapist seated behind patient for support and pt unable to utilize B UE and LE to stabilize herself, requires RW placed in front of patient for improved feeling of security. Sit to stand with RW and min A this date. Standing marches x 10 reps. Standing mini-squats 2 x 10 reps with RW and min A for balance, knees blocked and cues for hip extension. Seated cone kicks progressing to alt L/R cone-taps. Added 2.5# ankle weights to BLE for improved proprioceptive input, pt exhibits improved control of BLE with use of weights. Seated press-ups from yoga blocks with B knees blocked 2 x 10 reps, onset of extensor tone during activity. Even with B knees blocked pt exhibits severe extensor tone and unable to keep BLE in place on the floor. Stedy transfer back to w/c with Supervision for sit to stand. Manual w/c propulsion x 100 ft with use of BUE at min A level for steering. Pt exhibits improved ability to grip rims of w/c with addition of theraband and improved ability to maintain upright and anterior leaning trunk, however had  onset of BLE extensor tone again with activity and unable to keep BLE in place on w/c leg rests. Stedy transfer to recliner. Pt left seated in recliner in room with needs in reach, quick release belt and chair alarm in place at end of session.  Session 2: Pt received seated in recliner in room, agreeable to PT session. No complaints of pain. When asked if she needs to use the bathroom pt reports she just did, incontinently in her brief. Pt unable to state if she can tell if she needs to use the bathroom or not but can tell if she has been incontinent. Sit to stand with Supervision to stedy throughout session. Pt is able to perform pericare in standing with setup A. Pt requires assist to don/doff brief and to don new pants. Stedy transfer to w/c. Dependent transport via w/c to/from therapy gym for time and energy conservation. Demonstrated use of maxi sky and sling, pt hesitant to attempt this date despite encouragement that gait training would be safer while in harness. Pt agreeable to think about it and possibly trial maxi sky tomorrow. Sit to stand in // bars with CGA. Ambulation forwards/backwards 3 x 10 ft x 3 reps with seated rest break between each rep with min A for balance. Sit to stand with mod A to RW from w/c with heavy use of BLE extensor tone pushing against w/c during transfer that was not noted when standing in // bars. Pt exhibits  increased anxiety and fear of falling when standing with RW vs standing in // bars. Standing mini-squats 2 x 10 reps with RW and mod A for balance. Stedy transfer back to recliner at end of session with Supervision to stand with stedy. Pt left seated in recliner in room with needs in reach, quick release belt and chair alarm in place.   Therapy Documentation Precautions:  Precautions Precautions: Fall Precaution Comments: ataxic, sensation of falling with movement, anxious Restrictions Weight Bearing Restrictions: No     Therapy/Group: Individual  Therapy   Peter Congo, PT, DPT, CSRS  09/17/2020, 12:13 PM

## 2020-09-17 NOTE — Progress Notes (Signed)
Occupational Therapy Session Note  Patient Details  Name: Diane Willis MRN: [id number] Date of Birth: 07/27/1955  Today's Date: 09/17/2020 OT Individual Time: 0735-0800 OT Individual Time Calculation (min): 25 min    Short Term Goals: Week 2:  OT Short Term Goal 1 (Week 2): Pt will don shirt with S OT Short Term Goal 2 (Week 2): Pt will sit to stand iwht LRAD and MAX A of 1 in prep for BADL OT Short Term Goal 3 (Week 2): Pt will complete LB bathing with mod A OT Short Term Goal 4 (Week 2): Pt will complete LB dressing with mod A  Skilled Therapeutic Interventions/Progress Updates:    Pt greeted semi-reclined in bed and agreeable to OT treatment session focused on self-care retraining. Pt completed bed mobility with supervision. Stedy used for sit<>stand, but pt did not even use knee blocks to get up. Pt brought to the sink and completed UB bathing perched in stedy with set-up A and CGA for balance, needing unilateral UE support on stedy due to trunk weakness. Pt then stood and was able to maintain standing in [place] with min A while alternating UE's to wash peri-area and buttocks. Pt sat in wc and was fearful to remove unilateral UE from wc arm rests to even get her shirt on, back supported. With encouragement and CGA, pt able to remove  B UE's to don shirt. Tried to get pt into figure 4 position to thread pants, but pt unable to achieve figure 4 and lean forward to thread, requiring OT assist. Pt very fearful of any anterior weight shift. Pt fearful of trying to stand without Stedy and needed max enocuragement to try. Shoes donned with total A for time management to help pt feel more confident. Had pt weight shift onto legs prior to stand, then pt able to power up with min/mod A, then OT assist to get up pants. Pt returned to sitting for handoff to PT for next therapy session.   Therapy Documentation Precautions:  Precautions Precautions: Fall Precaution Comments: ataxic, sensation of  falling with movement, anxious Restrictions Weight Bearing Restrictions: No  Pain:  Denies pain   Therapy/Group: Individual Therapy  Mal Amabile 09/17/2020, 8:06 AM

## 2020-09-17 NOTE — Progress Notes (Signed)
Occupational Therapy Session Note  Patient Details  Name: Diane Willis MRN: [id number] Date of Birth: 1955-06-03  Today's Date: 09/17/2020 OT Individual Time: 1030-1110 OT Individual Time Calculation (min): 40 min    Short Term Goals: Week 2:  OT Short Term Goal 1 (Week 2): Pt will don shirt with S OT Short Term Goal 2 (Week 2): Pt will sit to stand iwht LRAD and MAX A of 1 in prep for BADL OT Short Term Goal 3 (Week 2): Pt will complete LB bathing with mod A OT Short Term Goal 4 (Week 2): Pt will complete LB dressing with mod A  Skilled Therapeutic Interventions/Progress Updates:    Pt resting recliner upon arrival. Pt reports that she already "washed up." OT intervention with focus on dynamic sitting balance when reaching outside BOS. Pt reached for objects on Rt with lateral leans and handed to therapist requiring pt to lean forward and reaching up. Pt's BLE with extensor tone at knees when engaging core for unsupported dynamic sitting tasks. Pt engaged in BUE therex in unsupported sitting with orange theraband-straight arm raise 3x8 and straight arm pulls 3x8. Sit<>stand in [place] with CGA x4. Pt remained setaed in recliner with belt alarm activated and all needs within reach.   Therapy Documentation Precautions:  Precautions Precautions: Fall Precaution Comments: ataxic, sensation of falling with movement, anxious Restrictions Weight Bearing Restrictions: No  Pain: Pain Assessment Pain Scale: 0-10 Pain Score: 0-No pain     Therapy/Group: Individual Therapy  Rich Brave 09/17/2020, 11:16 AM

## 2020-09-17 NOTE — Progress Notes (Signed)
PROGRESS NOTE   Subjective/Complaints:  Pt reports doing well- esp with bowel program- timed voiding better.  Per therapy/PT- pt has a lot of anxiety about falling- and a lot of extensor tone- some better, but not gone.  Also leans posteriorly.  Pushing is better.   ROS:  Pt denies SOB, abd pain, CP, N/V/C/D, and vision changes   Objective:   No results found. Recent Labs    09/17/20 0545  WBC 8.5  HGB 9.1*  HCT 28.7*  PLT 262   Recent Labs    09/17/20 0545  NA 139  K 4.9  CL 105  CO2 24  GLUCOSE 119*  BUN 20  CREATININE 1.03*  CALCIUM 9.4     Intake/Output Summary (Last 24 hours) at 09/17/2020 1056 Last data filed at 09/17/2020 0804 Gross per 24 hour  Intake 600 ml  Output --  Net 600 ml        Physical Exam: Vital Signs Blood pressure (!) 108/55, pulse 60, temperature 98.5 F (36.9 C), temperature source Oral, resp. rate 14, height 5\' 1"  (1.549 m), SpO2 100 %.   General: awake, alert, appropriate,sitting in bedside chair- just finished PT/PT in room;  NAD HENT: conjugate gaze; oropharynx moist CV: regular rate; no JVD Pulmonary: CTA B/L; no W/R/R- good air movement GI: soft, NT, ND, (+)BS Psychiatric: appropriate; but vague Neurological: alert; still will not allow to assess feet Extensor tone with ROM of trunk  Decreased to light touch in hands and below knees B/L - in depends- incontinent of urine.  Musculoskeletal:     Cervical back: Normal range of motion. No rigidity.     Comments: RUE- 4/5 except FA 4/5 LUE- 4/5 except FA 4-/5 RLE- HF 3-/5, at least 3/5 otherwise- so painful, cannot tolerate me touching her feet/legs LLE- HF 3+/5 -otherwise at least 3/5  Skin:    Comments: Bilateral fingers and palms with discoloration and mild edema.  Slightly cool to touch Buttocks and privates- no skin breakdown  Will NOT let us assess her feet/take socks off.  Everyday, try to check- pt  refuses. Wearing different socks today.   Assessment/Plan: 1. Functional deficits which require 3+ hours per day of interdisciplinary therapy in a comprehensive inpatient rehab setting. Physiatrist is providing close team supervision and 24 hour management of active medical problems listed below. Physiatrist and rehab team continue to assess barriers to discharge/monitor patient progress toward functional and medical goals  Care Tool:  Bathing    Body parts bathed by patient: Right arm, Left arm, Chest, Abdomen, Front perineal area, Right upper leg, Left upper leg (bed level for LB and seated in w/c for UB)   Body parts bathed by helper: Buttocks, Right lower leg, Left lower leg Body parts n/a: Right lower leg, Left lower leg (pt refuses to remove socks)   Bathing assist Assist Level: Maximal Assistance - Patient 24 - 49%     Upper Body Dressing/Undressing Upper body dressing   What is the patient wearing?: Hospital gown only    Upper body assist Assist Level: Minimal Assistance - Patient > 75%    Lower Body Dressing/Undressing Lower body dressing  What is the patient wearing?: Incontinence brief     Lower body assist Assist for lower body dressing: Total Assistance - Patient < 25%     Toileting Toileting    Toileting assist Assist for toileting: Maximal Assistance - Patient 25 - 49% (Bedlevel)     Transfers Chair/bed transfer  Transfers assist     Chair/bed transfer assist level: Dependent - mechanical lift (stedy)     Locomotion Ambulation   Ambulation assist   Ambulation activity did not occur: Safety/medical concerns (Anxiety, BLE weakness, SCI)  Assist level: 2 helpers Assistive device: Walker-rolling Max distance: 5'   Walk 10 feet activity   Assist  Walk 10 feet activity did not occur: Safety/medical concerns (Anxiety, BLE weakness, SCI)        Walk 50 feet activity   Assist Walk 50 feet with 2 turns activity did not occur:  Safety/medical concerns (Anxiety, BLE weakness, SCI)         Walk 150 feet activity   Assist Walk 150 feet activity did not occur: Safety/medical concerns (Anxiety, BLE weakness, SCI)         Walk 10 feet on uneven surface  activity   Assist Walk 10 feet on uneven surfaces activity did not occur: Safety/medical concerns (Anxiety, BLE weakness, SCI)         Wheelchair     Assist Is the patient using a wheelchair?: Yes Type of Wheelchair: Manual Wheelchair activity did not occur: Safety/medical concerns (High anxiety, fear-avoidance behavior)  Wheelchair assist level: Minimal Assistance - Patient > 75% Max wheelchair distance: 2ft    Wheelchair 50 feet with 2 turns activity    Assist    Wheelchair 50 feet with 2 turns activity did not occur: Safety/medical concerns (High anxiety, fear-avoidance behavior)       Wheelchair 150 feet activity     Assist  Wheelchair 150 feet activity did not occur: Safety/medical concerns (High anxiety, fear-avoidance behavior)       Blood pressure (!) 108/55, pulse 60, temperature 98.5 F (36.9 C), temperature source Oral, resp. rate 14, height 5\' 1"  (1.549 m), SpO2 100 %.  Medical Problem List and Plan: 1.  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  2.  Antithrombotics: -DVT/anticoagulation:  Mechanical: Sequential compression devices, below knee Bilateral lower extremities since Plts are 66k 8/27- Plts up to 130k- if they stay up on Monday, will try Lovenox- esp with hx of PE. Dopplers (-) on 8/27 8/28- labs qmonday AM 8/30- plts up to 227k - will start Lovenox and monitor closely. Ordered qmonday/Thursday CBC-diff 9/1- Plts 290k- doing much better             -antiplatelet therapy: ASA             --H/o PE last  year-- will check BLE dopplers with recent immobility/BLE weakness 3. Pain Management: Tylenol prn.  4. Mood: LCSW to follow for evaluation and support.              -antipsychotic agents: N/A 5. Neuropsych: This patient is capable of making decisions on his own behalf. 6. Skin/Wound Care: Routine pressure relief measures.  7. Fluids/Electrolytes/Nutrition: Monitor I/O. Check lytes in am.  8.  T2DM: Hgb A1C-7.8--monitor BS ac/hs and use SSI for elevated Bs             --Continue 70/30 insulin 25 units bid.   8/27- BG's running 200s-300s- will increase 70/30 to 30 units BID  9/1- BG's 160-213- will increase to 29 units- at 30 units, had very low BG 2 days ago in AM  9/2- BG's 124- 187- improved- con't regimen  9/3: CBGs 98-221: increase insulin to 30U  9/5- BG's 70-283- will con't regimen today.  9. Vitamin B12: B12< 50.               --Continue B12 1000 mcg daily X 7 days-->weekly for 7 weeks then monthly.  --lifelong B12 1000 mcg monthly supplement after loading doses.  10. 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 Thrombocytopenia: Platelets with steady drop 98 --> 68.             --was 149-171 range earlier this month and not on any heparin products.              --pancytopenia noted-->drop .   8/30- Plts up to 227k- started lovenox  9/2 plts 290k on lovenox 11. Vitamin B1 deficiency: Thiamine 53.1--continue supplement.  12. Anxiety d/o: Continue Xanax prn.  13. 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  8/27- having accidents- of bowel and bladder- will d/w pt tomorrow about bowel program, and timed voiding  8/28- will order timed voiding q2 hours while awake.  having bowel incontinence,   9/2- tolerating bowel program- but not documented- let nursing know- and timed voiding helping- PVR 166cc x1 15.  Spasticity-  9/5- will increase baclofen to 10 mg TID. For extensor  tone.    LOS: 11 days A FACE TO FACE EVALUATION WAS PERFORMED  Diane Willis 09/17/2020, 10:56 AM

## 2020-09-17 NOTE — Progress Notes (Signed)
Nurse approached patient regarding bowel program to be initiated. Patient presented still up in recliner. Patient stated that she was not ready for bowel program and she did not want it until she went to bed. Suppository rescheduled for 8 pm. Receiving nurse notified to proceed with program. Cletis Media, LPN

## 2020-09-18 LAB — GLUCOSE, CAPILLARY
Glucose-Capillary: 122 mg/dL — ABNORMAL HIGH (ref 70–99)
Glucose-Capillary: 205 mg/dL — ABNORMAL HIGH (ref 70–99)
Glucose-Capillary: 192 mg/dL — ABNORMAL HIGH (ref 70–99)
Glucose-Capillary: 105 mg/dL — ABNORMAL HIGH (ref 70–99)

## 2020-09-18 NOTE — Progress Notes (Signed)
PROGRESS NOTE   Subjective/Complaints:  Pt reports wondering if we can "fix numbness in her hands"- explained there are no meds/treatments for numbness- She does note husband or mother in law will be doing bowel program- No side effects from baclofen.  Per staff, anxiety and fearfulness are severely limiting therapy.    ROS:  Pt denies SOB, abd pain, CP, N/V/C/D, and vision changes   Objective:   No results found. Recent Labs    09/17/20 0545  WBC 8.5  HGB 9.1*  HCT 28.7*  PLT 262   Recent Labs    09/17/20 0545  NA 139  K 4.9  CL 105  CO2 24  GLUCOSE 119*  BUN 20  CREATININE 1.03*  CALCIUM 9.4     Intake/Output Summary (Last 24 hours) at 09/18/2020 1121 Last data filed at 09/18/2020 0728 Gross per 24 hour  Intake 600 ml  Output --  Net 600 ml        Physical Exam: Vital Signs Blood pressure (!) 109/48, pulse 65, temperature 98.4 F (36.9 C), temperature source Oral, resp. rate 14, height 5\' 1"  (1.549 m), SpO2 99 %.    General: awake, alert, appropriate, sitting up in bed; unsupported, but cannot tolerate in therapy; NAD HENT: conjugate gaze; oropharynx moist CV: regular rate; no JVD Pulmonary: CTA B/L; no W/R/R- good air movement GI: soft, NT, ND, (+)BS Psychiatric: appropriate; flat Neurological: alert  Decreased to light touch in hands and below knees B/L - no change Musculoskeletal:     Cervical back: Normal range of motion. No rigidity.     Comments: RUE- 4/5 except FA 4/5 LUE- 4/5 except FA 4-/5 RLE- HF 3-/5, at least 3/5 otherwise- so painful, cannot tolerate me touching her feet/legs LLE- HF 3+/5 -otherwise at least 3/5  Skin:    Comments: Bilateral fingers and palms with discoloration and mild edema.  Slightly cool to touch Buttocks and privates- no skin breakdown  Will NOT let assess her feet/take socks off.  Everyday, try to check- pt refuses. Wearing different socks today.    Assessment/Plan: 1. Functional deficits which require 3+ hours per day of interdisciplinary therapy in a comprehensive inpatient rehab setting. Physiatrist is providing close team supervision and 24 hour management of active medical problems listed below. Physiatrist and rehab team continue to assess barriers to discharge/monitor patient progress toward functional and medical goals  Care Tool:  Bathing    Body parts bathed by patient: Right arm, Left arm, Chest, Abdomen, Front perineal area, Right upper leg, Left upper leg (bed level for LB and seated in w/c for UB)   Body parts bathed by helper: Buttocks, Right lower leg, Left lower leg Body parts n/a: Right lower leg, Left lower leg (pt refuses to remove socks)   Bathing assist Assist Level: Maximal Assistance - Patient 24 - 49%     Upper Body Dressing/Undressing Upper body dressing   What is the patient wearing?: Hospital gown only    Upper body assist Assist Level: Minimal Assistance - Patient > 75%    Lower Body Dressing/Undressing Lower body dressing      What is the patient wearing?: Incontinence brief  Lower body assist Assist for lower body dressing: Total Assistance - Patient < 25%     Toileting Toileting    Toileting assist Assist for toileting: Maximal Assistance - Patient 25 - 49% (Bedlevel)     Transfers Chair/bed transfer  Transfers assist     Chair/bed transfer assist level: Dependent - mechanical lift     Locomotion Ambulation   Ambulation assist   Ambulation activity did not occur: Safety/medical concerns (Anxiety, BLE weakness, SCI)  Assist level: 2 helpers Assistive device: Walker-rolling Max distance: 5'   Walk 10 feet activity   Assist  Walk 10 feet activity did not occur: Safety/medical concerns (Anxiety, BLE weakness, SCI)        Walk 50 feet activity   Assist Walk 50 feet with 2 turns activity did not occur: Safety/medical concerns (Anxiety, BLE weakness, SCI)          Walk 150 feet activity   Assist Walk 150 feet activity did not occur: Safety/medical concerns (Anxiety, BLE weakness, SCI)         Walk 10 feet on uneven surface  activity   Assist Walk 10 feet on uneven surfaces activity did not occur: Safety/medical concerns (Anxiety, BLE weakness, SCI)         Wheelchair     Assist Is the patient using a wheelchair?: Yes Type of Wheelchair: Manual Wheelchair activity did not occur: Safety/medical concerns (High anxiety, fear-avoidance behavior)  Wheelchair assist level: Minimal Assistance - Patient > 75% Max wheelchair distance: 100'    Wheelchair 50 feet with 2 turns activity    Assist    Wheelchair 50 feet with 2 turns activity did not occur: Safety/medical concerns (High anxiety, fear-avoidance behavior)   Assist Level: Minimal Assistance - Patient > 75%   Wheelchair 150 feet activity     Assist  Wheelchair 150 feet activity did not occur: Safety/medical concerns (High anxiety, fear-avoidance behavior)       Blood pressure (!) 109/48, pulse 65, temperature 98.4 F (36.9 C), temperature source Oral, resp. rate 14, height 5\' 1"  (1.549 m), SpO2 99 %.  Medical Problem List and Plan: 1.  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  2.  Antithrombotics: -DVT/anticoagulation:  Mechanical: Sequential compression devices, below knee Bilateral lower extremities since Plts are 66k 8/27- Plts up to 130k- if they stay up on Monday, will try Lovenox- esp with hx of PE. Dopplers (-) on 8/27 8/28- labs qmonday AM 8/30- plts up to 227k - will start Lovenox and monitor closely. Ordered qmonday/Thursday CBC-diff 9/1- Plts 290k- doing much better              -antiplatelet therapy: ASA             --H/o PE last year-- will check BLE dopplers with recent immobility/BLE weakness 3. Pain Management: Tylenol prn.  4. Mood: LCSW to follow for evaluation and support.              -antipsychotic agents: N/A 5. Neuropsych: This patient is capable of making decisions on his own behalf. 6. Skin/Wound Care: Routine pressure relief measures.  7. Fluids/Electrolytes/Nutrition: Monitor I/O. Check lytes  in am.  8.  T2DM: Hgb A1C-7.8--monitor BS ac/hs and use SSI for elevated Bs             --Continue 70/30 insulin 25 units bid.   8/27- BG's running 200s-300s- will increase 70/30 to 30 units BID  9/1- BG's 160-213- will increase to 29 units- at 30 units, had very low BG 2 days ago in AM  9/2- BG's 124- 187- improved- con't regimen  9/3: CBGs 98-221: increase insulin to 30U  9/5- BG's 70-283- will con't regimen today.   9/6- BG's 90- 217- with 1 episode of 327- doesn't want meal insulin.  9. Vitamin B12: B12< 50.               --Continue B12 1000 mcg daily X 7 days-->weekly for 7 weeks then monthly.  --lifelong B12 1000 mcg monthly supplement after loading doses.  10. 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 Thrombocytopenia: Platelets with steady drop 98 --> 68.             --was 149-171 range earlier this month and not on any heparin products.              --pancytopenia noted-->drop .   8/30- Plts up to 227k- started lovenox  9/2 plts 290k on lovenox 11. Vitamin B1 deficiency: Thiamine 53.1--continue supplement.  12. Anxiety d/o: Continue Xanax prn.  13. 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  8/27- having accidents- of bowel and bladder- will d/w pt tomorrow about bowel program, and timed voiding  8/28- will order timed voiding q2 hours while awake.  having bowel incontinence,   9/2- tolerating bowel program- but not documented- let  nursing know- and timed voiding helping- PVR 166cc x1 15.  Spasticity-  9/5- will increase baclofen to 10 mg TID. For extensor tone. 9/6- doing better- con't regimen 16. Anxiety/fear of falling- 9/6- will d/w pt tomorrow about meds that could help this-     LOS: 12 days A FACE TO FACE EVALUATION WAS PERFORMED  Yakov Bergen 09/18/2020, 11:21 AM

## 2020-09-18 NOTE — Progress Notes (Signed)
Occupational Therapy Session Note  Patient Details  Name: Diane Willis MRN: [id number] Date of Birth: Oct 18, 1955  Today's Date: 09/18/2020 OT Individual Time: 1030-1055 OT Individual Time Calculation (min): 25 min    Short Term Goals: Week 2:  OT Short Term Goal 1 (Week 2): Pt will don shirt with S OT Short Term Goal 2 (Week 2): Pt will sit to stand iwht LRAD and MAX A of 1 in prep for BADL OT Short Term Goal 3 (Week 2): Pt will complete LB bathing with mod A OT Short Term Goal 4 (Week 2): Pt will complete LB dressing with mod A  Skilled Therapeutic Interventions/Progress Updates:    Pt resting in recliner upon arrival. OT intervention with focus on dynamic sitting balance while engaged BUE in therapeutic activities with 2# bar and orange theraband. Pt BLE continue to exhibit extensor tone and ataxic movements when engaging UB/BUE in unsupported sitting during functional activities. Rowing with 2# bar 3x8 and chest presses 3x8. Pt used theraband for stratight arm pulls 3x8. Discussed transfers at home and pt will need to be able to transfer with assistance at home. Informed pt that insurance will not cover Stedy.  Therapy Documentation Precautions:  Precautions Precautions: Fall Precaution Comments: ataxic, sensation of falling with movement, anxious Restrictions Weight Bearing Restrictions: No  Pain: Pain Assessment Pain Scale: 0-10 Pain Score: 0-No pain   Therapy/Group: Individual Therapy  Rich Brave 09/18/2020, 11:26 AM

## 2020-09-18 NOTE — Progress Notes (Signed)
Patient ID: Diane Willis, female   DOB: 1955-04-24, 65 y.o.   MRN: [id number]  SW met with pt and pt called her husband while SW in room so SW could provide updates from team conference, d/c date remains 9/21, and fam edu. Husband will come in for family edu on Thurs (9/8) and Fri (9/9) 9am-12pm and his mother will come as well. Pt husband states pt mother will be at the home during the day so pt will have support needed. Husband may not need to take LOA as he works evenings and off 3 days per week, his mother home during the day.   Loralee Pacas, MSW, [place] Office: [phone] Cell: [phone] Fax: [phone]

## 2020-09-18 NOTE — Progress Notes (Signed)
Physical Therapy Session Note  Patient Details  Name: Diane Willis MRN: [id number] Date of Birth: 02-Aug-1955  Today's Date: 09/18/2020 PT Individual Time: 1130-1200; 1315-1400 PT Individual Time Calculation (min): 30 min and 45 min PT Missed Time: 15 min  Missed Time Reason: patient eating lunch  Short Term Goals: Week 2:  PT Short Term Goal 1 (Week 2): Pt will perform least restrictive transfer with min A PT Short Term Goal 2 (Week 2): Pt will initiate gait training outside of // bars PT Short Term Goal 3 (Week 2): Pt will perform w/c mobility x 50 ft PT Short Term Goal 4 (Week 2): Pt will tolerate sitting up OOB x 2 hours  Skilled Therapeutic Interventions/Progress Updates:    Session 1: Pt received seated in recliner in room, agreeable to PT session. No complaints of pain. Session focus on standing with RW. Sit to stand with min A to RW during session, focus on safe UE placement and anterior weight shift with transfer. Standing forwards/backwards stepping with RW and min A for balance. Pt has onset of posterior lean and LOB when stepping backwards, able to correct with mod A and cues for anterior weight shifting. Standing alt UE lifts from RW with min A for standing balance, no LOB with decreased UE support. Standing alt L/R marches 2 x 10 reps with RW and min A for balance. Pt left seated in recliner in room with needs in reach, quick release belt and in place at end of session.  Session 2: Pt received seated in recliner in room, requests time to finish eating lunch. Pt missed 15 min of scheduled therapy session to allow time to eat lunch. Upon therapist return pt agreeable to PT session. No complaints of pain. Sit to stand with Supervision to stedy. Stedy transfer to w/c. Manual w/c propulsion x 150 ft with use of BUE, min A for steering but otherwise Supervision for propulsion. Pt exhibits improved ability to propel w/c for increased distances this date. Stedy transfer w/c to mat table.  Session focus on slide board transfers w/c to/from mat table. Pt requires assist x 2 for safety due to anxiety and fear of falling. Pt requires max x 2 to initiate transfer mat table to w/c, once she is able to pull with UE on w/c arm rest she is min A x 2 for transfer. Pt requires multimodal cueing for head/hips relationship and does have onset of extensor tone in BLE and trunk during transfer. Placed 4" step under BLE for improved support. Pt able to complete 3 slide board transfers with assist x 2, anxiety improves with each successful transfer. Stedy transfer back to recliner at end of session. Pt left seated in recliner in room with needs in reach, quick release belt in place.  Therapy Documentation Precautions:  Precautions Precautions: Fall Precaution Comments: ataxic, sensation of falling with movement, anxious Restrictions Weight Bearing Restrictions: No   Therapy/Group: Individual Therapy   Peter Congo, PT, DPT, CSRS  09/18/2020, 12:11 PM

## 2020-09-18 NOTE — Patient Care Conference (Signed)
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 Genice Rouge Social Worker Present: Cecile Sheerer, LCSWA Nurse Present: Kennyth Arnold, RN PT Present: Peter Congo, PT OT Present: Ardis Rowan, COTA;Jennifer Katrinka Blazing, OT PPS Coordinator present : Fae Pippin, SLP     Current Status/Progress Goal Weekly Team Focus  Bowel/Bladder   Incontinenec B/B. LBM 09/18/20  Pt. will regain continenece  Toilet pt. q2 and as needed   Swallow/Nutrition/ Hydration             ADL's   max A LB ADLs, supervision tranfsers with stedy, increased anxiety and extensor tone/ataxia with transitional movements outside BOS  min A to supervision overall  sitting balance, activity tolerance, BADL training, safety awareness   Mobility   min A bed mobility, Supervision to stand in stedy, CGA to stand in // bars, min to mod A to stand to RW, severe anxiety and extensor tone limiting function  Supervision to min A overall, mod A stairs, may need to be downgraded pending progress  LE NMR, standing as able, sitting balance, transfers   Communication             Safety/Cognition/ Behavioral Observations            Pain   Pt pain is currently managed well with orgered pain medication  Pt. will verbalize pain  level less than 2  Assess pain level q4 and PRN   Skin   Skin is cuurently intact with no new breakdown  Pt. will maintain skin integrity with no breakdown  Assess skin on qshift and PRN     Discharge Planning:  D/c to home with pt husband who will take LOA. Other supports mother or sister.   Team Discussion: Increased Baclofen, husband to do bowel program at home. On Lovenox, platelets improving. Wants to stay on 70/30. Bowel program going well, has sensation and only wants 1 digstem. Voiding q 2 hr. No pain or sleep issues. Still can't look at feet, BLE edema, and hands are peeling. Nursing to teach husband bowel program and Lovenox injections. Family education to be scheduled. Limited by anxiety. Extensor tone improving, min/mod assist with RW, fear of falling. Walked 100 ft pushing WC with min assist. ADL's min assist at bed level. Going past 90 degrees, anxiety gets worse. Can do more but anxiety is a barrier. Patient on target to meet rehab goals: Yes, anxiety is a barrier.  *See Care Plan and progress notes for long and short-term goals.   Revisions to Treatment Plan:  Not at this time.  Teaching Needs: Family education, medication management, bowel program education, transfer training, gait training, balance training, endurance training, safety awareness, anxiety management.  Current Barriers to Discharge: Decreased caregiver support, Medical stability, Home enviroment access/layout, Incontinence, Neurogenic bowel  and bladder, Lack of/limited family support, Weight, and Medication compliance  Possible Resolutions to Barriers: Continue current medications, provide emotional support.     Medical Summary Current Status: still refuses to let us look at feet- timed voiding going well; bowel program at night- - only wants 1 dig stim/refuses more; sleeping OK; no pain issues  Barriers to Discharge: Decreased family/caregiver support;Home enviroment access/layout;Incontinence;Medical  stability;Neurogenic Bowel & Bladder;Medication compliance;Other (comments);Weight  Barriers to Discharge Comments: need to schedule family education; severe anxiety and fearful Possible Resolutions to [employer] Focus: limited by anxiety- extensor tone slightly better; will not sit unsupported; so fearful of falling; focus- on anxiety- med changes; spasticity; plesant, but poor self awareness; steady at home?;  d/c 9/21-   Continued Need for Acute Rehabilitation Level of Care: The patient requires daily medical management by a physician with specialized training in physical medicine and rehabilitation for the following reasons: Direction of a multidisciplinary physical rehabilitation program to maximize functional independence : Yes Medical management of patient stability for increased activity during participation in an intensive rehabilitation regime.: Yes Analysis of laboratory values and/or radiology reports with any subsequent need for medication adjustment and/or medical intervention. : Yes   I attest that I was present, lead the team conference, and concur with the assessment and plan of the team.   Tennis Must 09/18/2020, 3:08 PM

## 2020-09-18 NOTE — Progress Notes (Signed)
Physical Therapy Session Note  Patient Details  Name: Diane Willis MRN: [id number] Date of Birth: January 31, 1955  Today's Date: 09/18/2020 PT Individual Time: 1421-1450 PT Individual Time Calculation (min): 29 min   Short Term Goals: Week 1:  PT Short Term Goal 1 (Week 1): Pt will perform sit <>supine w/min A PT Short Term Goal 1 - Progress (Week 1): Met PT Short Term Goal 2 (Week 1): Pt will perform sit <>stand w/mod A and LRAD PT Short Term Goal 2 - Progress (Week 1): Met PT Short Term Goal 3 (Week 1): Pt will perform bed <>chair transfers w/LRAD and mod A PT Short Term Goal 3 - Progress (Week 1): Met Week 2:  PT Short Term Goal 1 (Week 2): Pt will perform least restrictive transfer with min A PT Short Term Goal 2 (Week 2): Pt will initiate gait training outside of // bars PT Short Term Goal 3 (Week 2): Pt will perform w/c mobility x 50 ft PT Short Term Goal 4 (Week 2): Pt will tolerate sitting up OOB x 2 hours   Skilled Therapeutic Interventions/Progress Updates:  Patient completing bladder scan with nursing while reclined in recliner on entrance to room. On completion of scan, pt alert, pleasant, and agreeable to PT session.   Therapeutic Activity: Transfers: Patient performed sit<>stand and stand pivot transfers throughout session with CGA. Provided vc for reinforcement of good performance to improve confidence and self efficacy. Tactile cues provided via NDT facilitation for improved posture/ technique in forward lean, weight shift, maintaining upright posture.  Gait Training:  Prior to ambulation, pt asked how she is performing with ambulation in order to get pt self- report and indication of confidence. Pt relates that she has been doing well with walking and has been making it short distances. Pt provided with RW and requested to ambulate around her bed and back to recliner. Patient ambulated 12 ft x2 using RW with CGA. She is able to round the EOB prior to reaching a crowded  space where anxiety increases and pt relates need to turn around and sit down. Although she speaks with indication of anxiety, her quality of gait does not change. Provided with vc throughout that she is stepping well with good balance and just needs to "stand tall" and continue to step with good foot clearance.  Demonstrated good use of RW throughout with flexed forward trunk at hips. NDT facilitation provided at ribcage  from pt's R side and then behind pt throughout. Pt initially verbally anxious d/t PT position behind pt until positive reinforcement provided that continued assist is still provided and PT will continue to be there for pt.   Neuromuscular Re-ed: NMR facilitated during session with focus on trunk flexion, forward lean, standing balance, reducing extensor tone for improved bilateral knee flexion during sit <>stand transfers. Pt guided in upright seated posture while seated edge of seat at recliner. Focus on anterior pelvic tilt with upright trunk and posterior pelvic tilt with flexed trunk and arms resting on RW for comfort/ confidence. With NDT facilitation at ribcage and pelvis along with gentle block at ankle, pt performs sit-->stand with light CGA to RW. Pt continues to demo increased extension at Bil knees with descent to sit, so pt guided in rise to stand with Bil hands placed to seat of chair placed in front of her. Pt initially anxious d/t new challenge and movement, however pt reminded that she will be fully supported with hands on seat and feet on floor as well  as therapist to her side. She is able to perform rise to alternative quadriped position. From this position, she performs improved knee flexion in descend to sit. Is able to perform well x2 and relates decreased anxiety with activity. Related to pt the improvement seen in her own mobility and hopes to progress this activity into stand-->sit from RW to seat. Pt pleased with self and thankful for new activity. NMR performed for  improvements in motor control and coordination, balance, sequencing, judgement, and self confidence/ efficacy in performing all aspects of mobility at highest level of independence.   Patient semireclined in recliner with BLE elevated at end of session with brakes locked, seat alarm set, and all needs within reach.     Therapy Documentation Precautions:  Precautions Precautions: Fall Precaution Comments: ataxic, sensation of falling with movement, anxious Restrictions Weight Bearing Restrictions: No  Vital Signs: Therapy Vitals Temp: 99.3 F (37.4 C) Temp Source: Oral Pulse Rate: 71 Resp: 16 BP: 130/62 Patient Position (if appropriate): Sitting Oxygen Therapy SpO2: 100 % O2 Device: Room Air Pain: Patient with no pain complaint throughout session.   Therapy/Group: Individual Therapy  Alger Simons PT, DPT 09/18/2020, 5:49 PM

## 2020-09-18 NOTE — Progress Notes (Signed)
Occupational Therapy Session Note  Patient Details  Name: Diane Willis MRN: [id number] Date of Birth: 11-19-1955  Today's Date: 09/18/2020 OT Individual Time: 4643-1427 OT Individual Time Calculation (min): 40 min    Short Term Goals: Week 2:  OT Short Term Goal 1 (Week 2): Pt will don shirt with S OT Short Term Goal 2 (Week 2): Pt will sit to stand iwht LRAD and MAX A of 1 in prep for BADL OT Short Term Goal 3 (Week 2): Pt will complete LB bathing with mod A OT Short Term Goal 4 (Week 2): Pt will complete LB dressing with mod A  Skilled Therapeutic Interventions/Progress Updates:    Pt resting in bed upon arrival. Pt states she already washed up earlier and does not need to change clothing. Agreeable to getting OOB. Supine>sit EOB with supervision using bed rails. Sit<>stand in [place] with supervision and transfer to recliner seated on [place] cushion. OT intervention with focus on activities encouraging pt to reach outside BOS in all planes-forward, diagonally, laterally, and below level of knees. Pt with decreased anxiety when reaching outside BOS but still with some hesitancy. Pt remained seated in recliner with belt alarm activated and all needs within reach.  Therapy Documentation Precautions:  Precautions Precautions: Fall Precaution Comments: ataxic, sensation of falling with movement, anxious Restrictions Weight Bearing Restrictions: No  Pain:  Pt denies pain this morning.   Therapy/Group: Individual Therapy  Rich Brave 09/18/2020, 8:56 AM

## 2020-09-18 NOTE — Progress Notes (Signed)
Bowel program initiated. Dig Stim performed. No bowel movement noted. Pt. Tolerated well.

## 2020-09-19 LAB — GLUCOSE, CAPILLARY
Glucose-Capillary: 131 mg/dL — ABNORMAL HIGH (ref 70–99)
Glucose-Capillary: 124 mg/dL — ABNORMAL HIGH (ref 70–99)
Glucose-Capillary: 99 mg/dL (ref 70–99)
Glucose-Capillary: 177 mg/dL — ABNORMAL HIGH (ref 70–99)

## 2020-09-19 MED ORDER — PAROXETINE HCL 20 MG PO TABS
20.0000 mg | ORAL_TABLET | Freq: Every day | ORAL | Status: DC
  Administered 2020-09-19 – 2020-10-02 (×14): 20 mg via ORAL
  Filled 2020-09-19 (×15): qty 1

## 2020-09-19 NOTE — Progress Notes (Signed)
Physical Therapy Session Note  Patient Details  Name: CARNELIA OSCAR MRN: [id number] Date of Birth: 01/27/1955  Today's Date: 09/19/2020 PT Individual Time: 0900-0930 PT Individual Time Calculation (min): 30 min   Short Term Goals: Week 2:  PT Short Term Goal 1 (Week 2): Pt will perform least restrictive transfer with min A PT Short Term Goal 2 (Week 2): Pt will initiate gait training outside of // bars PT Short Term Goal 3 (Week 2): Pt will perform w/c mobility x 50 ft PT Short Term Goal 4 (Week 2): Pt will tolerate sitting up OOB x 2 hours  Skilled Therapeutic Interventions/Progress Updates:    Pt received seated in recliner in room, agreeable to PT session. No complaints of pain. Session focus on standing to RW and stand pivot transfers with RW. Sit to stand with min A to RW with cues for anterior weight shift. Stand pivot transfer recliner to bed then bed to recliner with RW and min A. Pt requires lots of encouragement during transfer due to significant anxiety. Pt has onset of extensor tone and posterior LOB prior to sitting and needs cues for anterior weight shift to gain balance prior to sitting. Pt left seated in recliner in room with needs in reach, quick release belt and chair alarm in place.  Therapy Documentation Precautions:  Precautions Precautions: Fall Precaution Comments: ataxic, sensation of falling with movement, anxious Restrictions Weight Bearing Restrictions: No    Therapy/Group: Individual Therapy   Peter Congo, PT, DPT, CSRS  09/19/2020, 12:13 PM

## 2020-09-19 NOTE — Progress Notes (Signed)
Occupational Therapy Weekly Progress Note  Patient Details  Name: Diane Willis MRN: [id number] Date of Birth: 17-Jan-1955  Beginning of progress report period: September 13, 2020 End of progress report period: September 19, 2020   Patient has met 4 of 4 short term goals.  Pt has demonstrated improvements in BADLs at bed level and seated in w/c at sink. Pt continues to require use of Stedy for sit<>stand and functional transfers to facilitate completion of bathing/dressing tasks when seated in w/c or recliner. Sit<>stand in [place] with supervision and CGA for standing balance. Pt continues to exhibit significant anxiety and BLE/trunk extensor tone when engaged in unsupported functional tasks or moving outside BOS. Pt family scheduled for education on 9/8 and 9/9. Pt may require use of Stedy at discharge.   Patient continues to demonstrate the following deficits: muscle weakness, decreased cardiorespiratoy endurance, impaired timing and sequencing, abnormal tone, unbalanced muscle activation, and decreased coordination, and decreased sitting balance, decreased standing balance, decreased postural control, and decreased balance strategies and therefore will continue to benefit from skilled OT intervention to enhance overall performance with BADL and Reduce care partner burden.  Patient progressing toward long term goals..  Continue plan of care.  OT Short Term Goals Week 2:  OT Short Term Goal 1 (Week 2): Pt will don shirt with S OT Short Term Goal 1 - Progress (Week 2): Met OT Short Term Goal 2 (Week 2): Pt will sit to stand iwht LRAD and MAX A of 1 in prep for BADL OT Short Term Goal 2 - Progress (Week 2): Met OT Short Term Goal 3 (Week 2): Pt will complete LB bathing with mod A OT Short Term Goal 3 - Progress (Week 2): Met OT Short Term Goal 4 (Week 2): Pt will complete LB dressing with mod A OT Short Term Goal 4 - Progress (Week 2): Met Week 3:  OT Short Term Goal 1 (Week 3): Pt will perform  sit<>stand with mod A using RW in preparation for pulling pants over hips OT Short Term Goal 2 (Week 3): Pt will complete LB bathing with min A seated EOB using lateral leans OT Short Term Goal 3 (Week 3): Pt will complete toileting with mod A   [name] 09/19/2020, 1:05 PM

## 2020-09-19 NOTE — Progress Notes (Signed)
Occupational Therapy Session Note  Patient Details  Name: Diane Willis MRN: [id number] Date of Birth: Jun 18, 1955  Today's Date: 09/19/2020 OT Individual Time: 0815-0900 OT Individual Time Calculation (min): 45 min    Short Term Goals: Week 2:  OT Short Term Goal 1 (Week 2): Pt will don shirt with S OT Short Term Goal 2 (Week 2): Pt will sit to stand iwht LRAD and MAX A of 1 in prep for BADL OT Short Term Goal 3 (Week 2): Pt will complete LB bathing with mod A OT Short Term Goal 4 (Week 2): Pt will complete LB dressing with mod A  Skilled Therapeutic Interventions/Progress Updates:    Pt resting in bed upon arrival. Pt declined bathing but requsted to changed into hospital scrubs. Bed mobility with supervision using bed rails. All transfers and standing with Stedy. Sit<>stand with Stedy at supervision. Pt assisted with pulling pants over hips while standing in [place]. Pt given a reacher to assist with threading BLE into pants. Pt continues with increased anxiety and BLE extensor tone with movements outside BOS and when leaning forward. Pt remained in recliner with all needs within reach. Belt alarm activated.   Therapy Documentation Precautions:  Precautions Precautions: Fall Precaution Comments: ataxic, sensation of falling with movement, anxious Restrictions Weight Bearing Restrictions: No  Pain: Pain Assessment Pain Scale: 0-10 Pain Score: 0-No pain  Therapy/Group: Individual Therapy  Rich Brave 09/19/2020, 9:02 AM

## 2020-09-19 NOTE — Progress Notes (Signed)
Patient ID: Diane Willis, female   DOB: 26-Apr-1955, 65 y.o.   MRN: [id number]  SW provided pt HHA list, and will f/u to discuss preference.  Cecile Sheerer, MSW, LCSWA Office: [phone] Cell: [phone] Fax: [phone]

## 2020-09-19 NOTE — Progress Notes (Signed)
Physical Therapy Session Note  Patient Details  Name: Diane Willis MRN: [id number] Date of Birth: Jun 26, 1955  Today's Date: 09/19/2020 PT Individual Time: 1450-1535 PT Individual Time Calculation (min): 45 min   Short Term Goals: Week 2:  PT Short Term Goal 1 (Week 2): Pt will perform least restrictive transfer with min A PT Short Term Goal 2 (Week 2): Pt will initiate gait training outside of // bars PT Short Term Goal 3 (Week 2): Pt will perform w/c mobility x 50 ft PT Short Term Goal 4 (Week 2): Pt will tolerate sitting up OOB x 2 hours  Skilled Therapeutic Interventions/Progress Updates: Pt presented in recliner c/o HA. Discussed with pt, apparently had HA all morning but did not advising previous therapies or nsg. Nsg notified and PTA advised will return in 30 min.  PTA returned 40 min later with nsg confirming that she received pain meds. Pt stating continues to have HA and would gasp and demonstrate pain behaviors. Advised pt will return to second session 1445 and to advise nsg if pain does not alleviate. Notified nsg.  Pt missed 57mn skilled PT due to HA.   Tx2: Pt presented in recliner agreeable to therapy . Pt states HA much better after requesting additional pain meds. PTA discussed with pt current level of function and family education to occur over next 2 days. Pt expresses that she will be able to do things once she's at home. Provided education that purpose of CIR is to get comfortable with functional mobility in safest manner PRIOR to leaving hospital. This includes being able to perform transfers consistently with least amount of assistance needed. Pt agreeable to perform block practice of stand pivot transfers. Performed STS from recliner with CGA and verbal cues for hand placement. Pt with increased extensor tone and pt demonstrating decreased ability to initiate transfer. Pt returned to sitting with PTA encouraging additional STS and marching in place. Sit to stand done in  same manner as prior with PTA providing facilitation for wt shifting and pt able to march in place x 10. Upon sitting pt indicated incontinent urinary void and requesting to change. Pt agreeable to continue to perform STS with RW and PTA assist with peri-care. Pt performed Sit to stand however due to pt bracing self against chair PTA unable to fully clear brief away from body. Pt was able to perform in recliner with legs elevated. Pt was able to also perform peri-care in recliner with pt performed Sit to stand and PTA cleaning buttocks to ensure cleanliness. Pt was also able to take a small step away from recliner to allow PTA to pull up pants. Pt performed total x5 Sit to stand to complete brief change with PTA providing mod multimodal cues to encourage increased wt through forefoot to negate posterior lean. Pt was able to complete an additional x 10 reps of standing march at recliner at end of session. Pt left in recliner at end of session with seat alarm on, call bell within reach and needs met.        Therapy Documentation Precautions:  Precautions Precautions: Fall Precaution Comments: ataxic, sensation of falling with movement, anxious Restrictions Weight Bearing Restrictions: No General: PT Amount of Missed Time (min): 90 Minutes PT Missed Treatment Reason: Patient unwilling to participate;Pain Vital Signs: Therapy Vitals Temp: 98.6 F (37 C) Temp Source: Oral Pulse Rate: 66 Resp: 18 BP: 134/80 Patient Position (if appropriate): Sitting Oxygen Therapy SpO2: 100 % O2 Device: Room Air Pain:  Mobility:   Locomotion :    Trunk/Postural Assessment :    Balance:   Exercises:   Other Treatments:      Therapy/Group: Individual Therapy  Josha Weekley 09/19/2020, 3:59 PM

## 2020-09-19 NOTE — Progress Notes (Signed)
PROGRESS NOTE   Subjective/Complaints:  Pt reports "doesn't need family training on bowel program because mother in law was CNA"- explained nurses usually do bowel programs, so likely will need training.  We discussed her absolute fear of falling- and how this anxiety is playing a role/limiting her in therapy greatly.  She doesn't want long term meds, but willing to try something "for now".  Is already on Xanax TID prn.   ROS:  Pt denies SOB, abd pain, CP, N/V/C/D, and vision changes  Objective:   No results found. Recent Labs    09/17/20 0545  WBC 8.5  HGB 9.1*  HCT 28.7*  PLT 262   Recent Labs    09/17/20 0545  NA 139  K 4.9  CL 105  CO2 24  GLUCOSE 119*  BUN 20  CREATININE 1.03*  CALCIUM 9.4     Intake/Output Summary (Last 24 hours) at 09/19/2020 0829 Last data filed at 09/19/2020 0813 Gross per 24 hour  Intake 520 ml  Output --  Net 520 ml        Physical Exam: Vital Signs Blood pressure (!) 127/59, pulse 68, temperature 99 F (37.2 C), temperature source Oral, resp. rate 17, height 5\' 1"  (1.549 m), SpO2 100 %.     General: awake, alert, appropriate, sitting up in bed; still won't let assess feet;  NAD HENT: conjugate gaze; oropharynx moist CV: regular rate; no JVD Pulmonary: CTA B/L; no W/R/R- good air movement GI: soft, NT, ND, (+)BS Psychiatric: appropriate; slightly flat/esp anxious when discussing her fear of falling Neurological: Ox3 Decreased to light touch in hands and below knees B/L - no change Musculoskeletal:     Cervical back: Normal range of motion. No rigidity.     Comments: RUE- 4/5 except FA 4/5 LUE- 4/5 except FA 4-/5 RLE- HF 3-/5, at least 3/5 otherwise- so painful, cannot tolerate me touching her feet/legs LLE- HF 3+/5 -otherwise at least 3/5  Skin:    Comments: Bilateral fingers and palms with discoloration and mild edema.  Slightly cool to touch Buttocks and  privates- no skin breakdown  Will NOT let us assess her feet/take socks off.  Everyday, try to check- pt refuses. Wearing different socks today.   Assessment/Plan: 1. Functional deficits which require 3+ hours per day of interdisciplinary therapy in a comprehensive inpatient rehab setting. Physiatrist is providing close team supervision and 24 hour management of active medical problems listed below. Physiatrist and rehab team continue to assess barriers to discharge/monitor patient progress toward functional and medical goals  Care Tool:  Bathing    Body parts bathed by patient: Right arm, Left arm, Chest, Abdomen, Front perineal area, Right upper leg, Left upper leg (bed level for LB and seated in w/c for UB)   Body parts bathed by helper: Buttocks, Right lower leg, Left lower leg Body parts n/a: Right lower leg, Left lower leg (pt refuses to remove socks)   Bathing assist Assist Level: Maximal Assistance - Patient 24 - 49%     Upper Body Dressing/Undressing Upper body dressing   What is the patient wearing?: Hospital gown only    Upper body assist Assist Level: Minimal Assistance -  Patient > 75%    Lower Body Dressing/Undressing Lower body dressing      What is the patient wearing?: Incontinence brief     Lower body assist Assist for lower body dressing: Total Assistance - Patient < 25%     Toileting Toileting    Toileting assist Assist for toileting: Maximal Assistance - Patient 25 - 49% (Bedlevel)     Transfers Chair/bed transfer  Transfers assist     Chair/bed transfer assist level: 2 Helpers (slide board)     Locomotion Ambulation   Ambulation assist   Ambulation activity did not occur: Safety/medical concerns (Anxiety, BLE weakness, SCI)  Assist level: 2 helpers Assistive device: Walker-rolling Max distance: 5'   Walk 10 feet activity   Assist  Walk 10 feet activity did not occur: Safety/medical concerns (Anxiety, BLE weakness, SCI)         Walk 50 feet activity   Assist Walk 50 feet with 2 turns activity did not occur: Safety/medical concerns (Anxiety, BLE weakness, SCI)         Walk 150 feet activity   Assist Walk 150 feet activity did not occur: Safety/medical concerns (Anxiety, BLE weakness, SCI)         Walk 10 feet on uneven surface  activity   Assist Walk 10 feet on uneven surfaces activity did not occur: Safety/medical concerns (Anxiety, BLE weakness, SCI)         Wheelchair     Assist Is the patient using a wheelchair?: Yes Type of Wheelchair: Manual Wheelchair activity did not occur: Safety/medical concerns (High anxiety, fear-avoidance behavior)  Wheelchair assist level: Minimal Assistance - Patient > 75% Max wheelchair distance: 150'    Wheelchair 50 feet with 2 turns activity    Assist    Wheelchair 50 feet with 2 turns activity did not occur: Safety/medical concerns (High anxiety, fear-avoidance behavior)   Assist Level: Minimal Assistance - Patient > 75%   Wheelchair 150 feet activity     Assist  Wheelchair 150 feet activity did not occur: Safety/medical concerns (High anxiety, fear-avoidance behavior)   Assist Level: Minimal Assistance - Patient > 75%   Blood pressure (!) 127/59, pulse 68, temperature 99 F (37.2 C), temperature source Oral, resp. rate 17, height 5\' 1"  (1.549 m), SpO2 100 %.  Medical Problem List and Plan: 1.  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   Con't PT and OT- fear limiting therapy- see below about med changes 2.  Antithrombotics: -DVT/anticoagulation:  Mechanical: Sequential compression devices, below knee Bilateral lower extremities since Plts are 66k 8/27- Plts up to 130k- if  they stay up on Monday, will try Lovenox- esp with hx of PE. Dopplers (-) on 8/27 8/28- labs qmonday AM 8/30- plts up to 227k - will start Lovenox and monitor closely. Ordered qmonday/Thursday CBC-diff 9/1- Plts 290k- doing much better             -antiplatelet therapy: ASA             --H/o PE last year- 3. Pain Management: Tylenol prn.  4. Mood: LCSW to follow for evaluation and support.              -  antipsychotic agents: N/A 5. Neuropsych: This patient is capable of making decisions on his own behalf. 6. Skin/Wound Care: Routine pressure relief measures.  7. Fluids/Electrolytes/Nutrition: Monitor I/O. Check lytes in am.  8.  T2DM: Hgb A1C-7.8--monitor BS ac/hs and use SSI for elevated Bs             --Continue 70/30 insulin 25 units bid.   8/27- BG's running 200s-300s- will increase 70/30 to 30 units BID  9/1- BG's 160-213- will increase to 29 units- at 30 units, had very low BG 2 days ago in AM  9/7- doesn't want meal time insulin- BG's 105-205- doing better- con't regimen- had OJ in room, FYI 9. Vitamin B12: B12< 50.               --Continue B12 1000 mcg daily X 7 days-->weekly for 7 weeks then monthly.  --lifelong B12 1000 mcg monthly supplement after loading doses.  10. 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 Thrombocytopenia: Platelets with steady drop 98 --> 68.             --was 149-171 range earlier this month and not on any heparin products.              --pancytopenia noted-->drop .   8/30- Plts up to 227k- started lovenox  9/2 plts 290k on lovenox 11. Vitamin B1 deficiency: Thiamine 53.1--continue supplement.  12. Anxiety d/o: Continue Xanax prn.  13. 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  8/27- having accidents- of bowel and bladder- will d/w pt tomorrow about bowel program, and timed voiding  8/28- will order timed voiding q2 hours while awake.  having  bowel incontinence,   9/2- tolerating bowel program- but not documented- let nursing know- and timed voiding helping- PVR 166cc x1  9/7- need husband trained in bowel program 15.  Spasticity-  9/5- will increase baclofen to 10 mg TID. For extensor tone. 9/6- doing better- con't regimen 16. Anxiety/fear of falling- 9/6- will d/w pt tomorrow about meds that could help this-  9/7- will start Paxil 20 mg nightly for anxiety since already on Xanax prn    LOS: 13 days A FACE TO FACE EVALUATION WAS PERFORMED  Keydi Giel 09/19/2020, 8:29 AM

## 2020-09-20 LAB — CBC WITH DIFFERENTIAL/PLATELET
Abs Immature Granulocytes: 0.03 10*3/uL (ref 0.00–0.07)
Basophils Absolute: 0.1 10*3/uL (ref 0.0–0.1)
Basophils Relative: 1 %
Eosinophils Absolute: 0.5 10*3/uL (ref 0.0–0.5)
Eosinophils Relative: 7 %
HCT: 28.2 % — ABNORMAL LOW (ref 36.0–46.0)
Hemoglobin: 8.7 g/dL — ABNORMAL LOW (ref 12.0–15.0)
Immature Granulocytes: 0 %
Lymphocytes Relative: 20 %
Lymphs Abs: 1.4 10*3/uL (ref 0.7–4.0)
MCH: 33.2 pg (ref 26.0–34.0)
MCHC: 30.9 g/dL (ref 30.0–36.0)
MCV: 107.6 fL — ABNORMAL HIGH (ref 80.0–100.0)
Monocytes Absolute: 0.8 10*3/uL (ref 0.1–1.0)
Monocytes Relative: 11 %
Neutro Abs: 4.2 10*3/uL (ref 1.7–7.7)
Neutrophils Relative %: 61 %
Platelets: 262 10*3/uL (ref 150–400)
RBC: 2.62 MIL/uL — ABNORMAL LOW (ref 3.87–5.11)
RDW: 14.2 % (ref 11.5–15.5)
WBC: 7 10*3/uL (ref 4.0–10.5)
nRBC: 0 % (ref 0.0–0.2)

## 2020-09-20 LAB — GLUCOSE, CAPILLARY
Glucose-Capillary: 71 mg/dL (ref 70–99)
Glucose-Capillary: 173 mg/dL — ABNORMAL HIGH (ref 70–99)
Glucose-Capillary: 67 mg/dL — ABNORMAL LOW (ref 70–99)
Glucose-Capillary: 79 mg/dL (ref 70–99)
Glucose-Capillary: 196 mg/dL — ABNORMAL HIGH (ref 70–99)
Glucose-Capillary: 168 mg/dL — ABNORMAL HIGH (ref 70–99)

## 2020-09-20 MED ORDER — INSULIN ASPART PROT & ASPART (70-30 MIX) 100 UNIT/ML ~~LOC~~ SUSP
29.0000 [IU] | Freq: Two times a day (BID) | SUBCUTANEOUS | Status: DC
  Administered 2020-09-20 – 2020-09-22 (×4): 29 [IU] via SUBCUTANEOUS

## 2020-09-20 NOTE — Progress Notes (Signed)
Physical Therapy Session Note  Patient Details  Name: Diane Willis MRN: [id number] Date of Birth: 30-Nov-1955  Today's Date: 09/20/2020 PT Individual Time: 0905-1005 and 3559-7416 PT Individual Time Calculation (min): 60 min   Short Term Goals: Week 2:  PT Short Term Goal 1 (Week 2): Pt will perform least restrictive transfer with min A PT Short Term Goal 2 (Week 2): Pt will initiate gait training outside of // bars PT Short Term Goal 3 (Week 2): Pt will perform w/c mobility x 50 ft PT Short Term Goal 4 (Week 2): Pt will tolerate sitting up OOB x 2 hours  Skilled Therapeutic Interventions/Progress Updates: Pt presented in recliner agreeable to therapy. Pt denies pain throughout session. Session to focus on family education however family not present until 9:30 therefore first part of session focused on education of increasing mobility and endurance. Pt transferred to w/c via Stedy CGA. Pt then propelled to rehab gym with x 1 rest break with supervision and increased time. Upon family's arrival discussed pt's current functional level and that pt has high potential to d/c at ambulatory level. Performed Sit to stand from w/c x 5 with CGA and mod multimodal cues for anterior translation of hip and to increase wt bearing through forefeet. Pt with x 1 occurrence of increased anxiety causing pt to unsafely return to w/c with pt's MIL encouraging pt with appropriate cues to correct weight shift. With encouragement and VERY CLOSE w/c follow pt ambulated ~61f with minA nearing CGA. Pt transported back to room at end of session returned to recliner via Stedy. Pt left in recliner at end of session with seat alarm on, call bell within reach and needs met.   Tx2: Pt presented in recliner with NT present agreeable to therapy. Pt performed STS in Stedy to complete peri-care as pt noted to be incontinent of bladder. Pt then transferred to w/c via Stedy. Pt transported to rehab gym and participated in toe taps at  stairs. Pt able to perform toe taps with CGA but noted heavy reliance on rails. Pt with notable exertion after activity, discussed current level of deconditioning and due to decreased mobility smaller tasks require increased effort. Pt verbalized understanding. Pt then transported to dayroom and participated in Cybex Kinetron 70cm/sec 4 bouts at 1 minute each for conditioning. Pt expressed some increased nausea after activity but states resolved with extended rest. Pt then transported back to room and with increased time and effort was able to perform a stand pivot transfer w/c to recliner! Pt did have some posterior lean at times however with cues was able to correct and complete transfer without LOB. Pt left in recliner at end of session with belt alarm on, call bell within reach and needs met.       Therapy Documentation Precautions:  Precautions Precautions: Fall Precaution Comments: ataxic, sensation of falling with movement, anxious Restrictions Weight Bearing Restrictions: No General:   Vital Signs: Therapy Vitals Temp: 98.9 F (37.2 C) Temp Source: Oral Pulse Rate: 61 Resp: 16 BP: 138/63 Patient Position (if appropriate): Sitting Oxygen Therapy SpO2: 100 % O2 Device: Room Air    Therapy/Group: Individual Therapy  Dasan Hardman Keziyah Kneale, PTA  09/20/2020, 4:16 PM

## 2020-09-20 NOTE — Progress Notes (Signed)
PROGRESS NOTE   Subjective/Complaints:  Pt reports doing well; denies any issues.   Pt incontinent on self- in spite of NT in room- calls but just voids on self.   Had hypoglycemia today. Will make sure she gets bedtime snack.     ROS:  Pt denies SOB, abd pain, CP, N/V/C/D, and vision changes    Objective:   No results found. Recent Labs    09/20/20 0459  WBC 7.0  HGB 8.7*  HCT 28.2*  PLT 262   No results for input(s): NA, K, CL, CO2, GLUCOSE, BUN, CREATININE, CALCIUM in the last 72 hours.    Intake/Output Summary (Last 24 hours) at 09/20/2020 1246 Last data filed at 09/20/2020 0700 Gross per 24 hour  Intake 540 ml  Output --  Net 540 ml        Physical Exam: Vital Signs Blood pressure 138/63, pulse 61, temperature 98.9 F (37.2 C), temperature source Oral, resp. rate 16, height 5\' 1"  (1.549 m), SpO2 100 %.      General: awake, alert, appropriate, flat; sitting up in bed;  NAD HENT: conjugate gaze; oropharynx moist CV: regular rate; no JVD Pulmonary: CTA B/L; no W/R/R- good air movement GI: soft, NT, ND, (+)BS Psychiatric: appropriate but very flat- lackadaisical  Neurological: alert- will not let look at feet  Decreased to light touch in hands and below knees B/L - no change Musculoskeletal:     Cervical back: Normal range of motion. No rigidity.     Comments: RUE- 4/5 except FA 4/5 LUE- 4/5 except FA 4-/5 RLE- HF 3-/5, at least 3/5 otherwise- so painful, cannot tolerate me touching her feet/legs LLE- HF 3+/5 -otherwise at least 3/5  Skin:    Comments: Bilateral fingers and palms with discoloration and mild edema.  Slightly cool to touch Buttocks and privates- no skin breakdown  Will NOT let us assess her feet/take socks off.  Everyday, try to check- pt refuses. Wearing different socks today.   Assessment/Plan: 1. Functional deficits which require 3+ hours per day of interdisciplinary  therapy in a comprehensive inpatient rehab setting. Physiatrist is providing close team supervision and 24 hour management of active medical problems listed below. Physiatrist and rehab team continue to assess barriers to discharge/monitor patient progress toward functional and medical goals  Care Tool:  Bathing    Body parts bathed by patient: Right arm, Left arm, Chest, Abdomen, Front perineal area, Right upper leg, Left upper leg (bed level for LB and seated in w/c for UB)   Body parts bathed by helper: Buttocks, Right lower leg, Left lower leg Body parts n/a: Right lower leg, Left lower leg (pt refuses to remove socks)   Bathing assist Assist Level: Maximal Assistance - Patient 24 - 49%     Upper Body Dressing/Undressing Upper body dressing   What is the patient wearing?: Pull over shirt    Upper body assist Assist Level: Contact Guard/Touching assist    Lower Body Dressing/Undressing Lower body dressing      What is the patient wearing?: Incontinence brief     Lower body assist Assist for lower body dressing: Moderate Assistance - Patient 50 - 74% (standing  in [place])     Editor, commissioning assist Assist for toileting: Maximal Assistance - Patient 25 - 49% (Bedlevel)     Transfers Chair/bed transfer  Transfers assist     Chair/bed transfer assist level: Minimal Assistance - Patient > 75% (stand pivot with RW)     Locomotion Ambulation   Ambulation assist   Ambulation activity did not occur: Safety/medical concerns (Anxiety, BLE weakness, SCI)  Assist level: 2 helpers Assistive device: Walker-rolling Max distance: 5'   Walk 10 feet activity   Assist  Walk 10 feet activity did not occur: Safety/medical concerns (Anxiety, BLE weakness, SCI)        Walk 50 feet activity   Assist Walk 50 feet with 2 turns activity did not occur: Safety/medical concerns (Anxiety, BLE weakness, SCI)         Walk 150 feet activity   Assist Walk  150 feet activity did not occur: Safety/medical concerns (Anxiety, BLE weakness, SCI)         Walk 10 feet on uneven surface  activity   Assist Walk 10 feet on uneven surfaces activity did not occur: Safety/medical concerns (Anxiety, BLE weakness, SCI)         Wheelchair     Assist Is the patient using a wheelchair?: Yes Type of Wheelchair: Manual Wheelchair activity did not occur: Safety/medical concerns (High anxiety, fear-avoidance behavior)  Wheelchair assist level: Minimal Assistance - Patient > 75% Max wheelchair distance: 150'    Wheelchair 50 feet with 2 turns activity    Assist    Wheelchair 50 feet with 2 turns activity did not occur: Safety/medical concerns (High anxiety, fear-avoidance behavior)   Assist Level: Minimal Assistance - Patient > 75%   Wheelchair 150 feet activity     Assist  Wheelchair 150 feet activity did not occur: Safety/medical concerns (High anxiety, fear-avoidance behavior)   Assist Level: Minimal Assistance - Patient > 75%   Blood pressure 138/63, pulse 61, temperature 98.9 F (37.2 C), temperature source Oral, resp. rate 16, height 5\' 1"  (1.549 m), SpO2 100 %.  Medical Problem List and Plan: 1.  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   Con't PT and OT- fear limiting therapy dramatically.  2.  Antithrombotics: -DVT/anticoagulation:  Mechanical: Sequential compression devices, below knee Bilateral lower extremities since Plts are 66k 8/27- Plts up to 130k- if they stay up on Monday, will try Lovenox- esp with hx of PE. Dopplers (-) on 8/27 8/28- labs qmonday AM 8/30- plts up to 227k - will start Lovenox and monitor closely. Ordered qmonday/Thursday CBC-diff 9/1-  Plts 290k- doing much better             -antiplatelet therapy: ASA             --H/o PE last year- 3. Pain Management: Tylenol prn.  4. Mood: LCSW to follow for evaluation and support.              -antipsychotic agents: N/A 5. Neuropsych: This patient is capable of making decisions on his own behalf. 6. Skin/Wound Care: Routine pressure relief measures.  7. Fluids/Electrolytes/Nutrition: Monitor I/O. Check  lytes in am.  8.  T2DM: Hgb A1C-7.8--monitor BS ac/hs and use SSI for elevated Bs             --Continue 70/30 insulin 25 units bid.   8/27- BG's running 200s-300s- will increase 70/30 to 30 units BID  9/1- BG's 160-213- will increase to 29 units- at 30 units, had very low BG 2 days ago in AM  9/7- doesn't want meal time insulin- BG's 105-205- doing better- con't regimen- had OJ in room, FYI  9/8- will decrease to 29 units BID and add nighttime snack due to some hypoglycemia in last 24 hours.  9. Vitamin B12: B12< 50.               --Continue B12 1000 mcg daily X 7 days-->weekly for 7 weeks then monthly.  --lifelong B12 1000 mcg monthly supplement after loading doses.  10. 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 Thrombocytopenia: Platelets with steady drop 98 --> 68.             --was 149-171 range earlier this month and not on any heparin products.              --pancytopenia noted-->drop .   8/30- Plts up to 227k- started lovenox  9/2 plts 290k on lovenox 11. Vitamin B1 deficiency: Thiamine 53.1--continue supplement.  12. Anxiety d/o: Continue Xanax prn.  13. 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  8/27- having accidents- of bowel and bladder- will d/w pt tomorrow about bowel program, and timed voiding  8/28- will order timed voiding q2 hours while awake.  having bowel incontinence,   9/2- tolerating bowel program- but not documented- let nursing know- and timed  voiding helping- PVR 166cc x1  9/7- need husband trained in bowel program  9/8-= per staff, she's voiding on self and unconcerned about it. On timed voiding, but even with staff in room, still voids.  15.  Spasticity-  9/5- will increase baclofen to 10 mg TID. For extensor tone. 9/6- doing better- con't regimen 16. Anxiety/fear of falling- 9/6- will d/w pt tomorrow about meds that could help this-  9/7- will start Paxil 20 mg nightly for anxiety since already on Xanax prn     LOS: 14 days A FACE TO FACE EVALUATION WAS PERFORMED  Shronda Boeh 09/20/2020, 12:46 PM

## 2020-09-20 NOTE — Progress Notes (Signed)
Occupational Therapy Session Note  Patient Details  Name: Diane Willis MRN: [id number] Date of Birth: [birth year]  Today's Date: 09/20/2020 OT Individual Time: 1000-1100 OT Individual Time Calculation (min): 60 min    Short Term Goals: Week 1:  OT Short Term Goal 1 (Week 1): Pt will sit EOB/EOM for 5 min with no more than MIN A dynamically OT Short Term Goal 1 - Progress (Week 1): Met OT Short Term Goal 2 (Week 1): pt will weight shift forward in mod ranges outside BOS wiht min VC for breathing to demo improved coping strategies OT Short Term Goal 2 - Progress (Week 1): Met OT Short Term Goal 3 (Week 1): Pt will don shirt with S OT Short Term Goal 3 - Progress (Week 1): Progressing toward goal OT Short Term Goal 4 (Week 1): Pt will sit to stand iwht LRAD and MAX A of 1 in prep for BADL OT Short Term Goal 4 - Progress (Week 1): Progressing toward goal Week 2:  OT Short Term Goal 1 (Week 2): Pt will don shirt with S OT Short Term Goal 1 - Progress (Week 2): Met OT Short Term Goal 2 (Week 2): Pt will sit to stand iwht LRAD and MAX A of 1 in prep for BADL OT Short Term Goal 2 - Progress (Week 2): Met OT Short Term Goal 3 (Week 2): Pt will complete LB bathing with mod A OT Short Term Goal 3 - Progress (Week 2): Met OT Short Term Goal 4 (Week 2): Pt will complete LB dressing with mod A OT Short Term Goal 4 - Progress (Week 2): Met  Skilled Therapeutic Interventions/Progress Updates:     Pt received in recliner with no indication of pain FAMILY EDUCATION: Pt mother in law and husband present for family education. Spent significant amount of time discussing PLOF, CLOF, use of stedy v RW and prognosis. Pt now seems very motivated to be up on walker and use walker when going home, however OT provides information on STEDY for use at home if pt does not improve to a safe LOF prior to DC home. Family seem to be very motivating to patient and OT discusses DME needs such as eventual use of a shower  chair/TTB for accessing walk in shower. OT recommends this be addressed at a HH/OP level and likely pt may need to use [facility] w/c at initial time home. Pt uses stedy recliner<>w/c with supervision. Pt and OT demo STS, body mechanics, pt positioning, and cuing for mobility. Pt able to complete alternating toe taps to soft cone with 50% accuracy in prep for foot clearance during stand picot transfers. SPT w/c<>EOM with min to power up and up to MAX A for post lean with VC for weight shifting forward. Pt left at end of session in bed with exit alarm on, call light in reach and all needs met   Therapy Documentation Precautions:  Precautions Precautions: Fall Precaution Comments: ataxic, sensation of falling with movement, anxious Restrictions Weight Bearing Restrictions: No General:   Therapy/Group: Individual Therapy  Tonny Branch 09/20/2020, 6:46 AM

## 2020-09-20 NOTE — Progress Notes (Signed)
Hypoglycemic Event  CBG: 67  Treatment: 8 oz juice/soda  Symptoms: None  Follow-up CBG: Time:12:33 pm CBG Result: 79  Possible Reasons for Event: Unknown  Comments/MD notified: Provider notified. Patient shows no signs or symptoms of discomfort. Alert, awake, and oriented. Sitting up in chair, eating lunch. Will continue to monitor.    Diane Willis

## 2020-09-20 NOTE — Progress Notes (Signed)
Occupational Therapy Session Note  Patient Details  Name: Diane Willis MRN: [id number] Date of Birth: February 24, 1955  Today's Date: 09/21/2020 OT Individual Time: 1003-1058 OT Individual Time Calculation (min): 55 min    Skilled Therapeutic Interventions/Progress Updates:    Pt greeted in bed with no c/o pain, just incontinence in brief. Her husband [name] was present for family education today. Pt plans to complete perihygiene while standing at the sink and also use the RW for toilet + shower transfers at home. Supine<sit from elevated bed without bedrails (per home setup) completed with setup assistance. [facility] assisted therapist with helping pt don her shoes. Min A for stand pivot<w/c using RW, note that due to pts extensor tone in LEs, she would tip the w/c backwards. Assistance needed throughout session to stabilize w/c during transfers. [name] aware of this, assisting therapist with DME stabilization throughout tx. Note that pt also had trouble with sitting in the center of the transfer surface, tended to shift her hips towards the Lt during stand<sit transitions. Cues and min manual assistance needed to correct for safety. [name] had hands on practice with sit<stands and providing ADL assistance during perihygiene, brief change, and LB dressing. Also practiced simulated toilet transfer with CGA-Min A x2 for safety. [name] would like a BSC and shower seat (for their walk in shower) at home. Discussed with him option of Stedy with [name] opting to defer Stedy at this time, wants pt to increase functional independence with the walker. Pt remained sitting up in the w/c, anticipating next therapist for continued family education.   Therapy Documentation Precautions:  Precautions Precautions: Fall Precaution Comments: ataxic, sensation of falling with movement, anxious Restrictions Weight Bearing Restrictions: No    ADL: ADL Upper Body Bathing: Supervision/safety Where Assessed-Upper Body  Bathing: Wheelchair Lower Body Bathing: Maximal assistance Where Assessed-Lower Body Bathing: Standing at sink, Sitting at sink (stedy) Upper Body Dressing: Minimal assistance Where Assessed-Upper Body Dressing: Wheelchair Lower Body Dressing: Maximal assistance Where Assessed-Lower Body Dressing: Sitting at sink, Standing at sink (stedy) Toileting: Maximal assistance Where Assessed-Toileting: Bedside Commode Toilet Transfer: Dependent (stedy- MIN-MOD A to power up)      Therapy/Group: Individual Therapy  Elya Diloreto A Brittiany Wiehe 09/21/2020, 12:34 PM

## 2020-09-20 NOTE — Evaluation (Signed)
Recreational Therapy Assessment and Plan  Patient Details  Name: Diane Willis MRN: [id number] Date of Birth: [birth year] Today's Date: 09/20/2020  Rehab Potential:  Good ELOS:   d/c 9/21  Assessment  Hospital Problem: Principal Problem:   Subacute combined degeneration of spinal cord ([place]) Active Problems:   Controlled type 2 diabetes mellitus with hyperglycemia ([place])     Past Medical History:      Past Medical History:  Diagnosis Date   COVID-19 04/2019   Diabetes mellitus without complication ([place])     Hypertension     Pulmonary emboli (H[facility])--presumed post Covid      Past Surgical History:       Past Surgical History:  Procedure Laterality Date   ABDOMINAL HYSTERECTOMY          Assessment & Plan Clinical Impression: Patient is a 65 y.o. year old female with history of T2DM- A1c of 6.7, HTN, anxiety d/o, Covid 19  complicated by ARDS/PE [id number]/4835 who was admitted on 08/30/20 with 3 week history of progressive BLE weakness with tingling bilateral fee and inability to walk.  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  Neurology consulted for input and patient noted to have sensory and proprioceptive deficits bilateral feet BLE and finger tips concerning for large fiber neuropathy and concerning for B12 deficiency. She has refused to remove socks for exam of feet.  Labs revealed Vitamin B12 and thiamine deficiency with elevated MMA and homocysteine levels. Zinc and copper WNL.  She was started on IM B12 supplement with improvement in symptoms. Neurology recommends life long vitamin B 12 supplement after loading dose for dorsal column syndrome.    Pt presents with decreased activity tolerance, decreased functional mobility, decreased balance, decreased coordination, decreased awareness and feelings of  stress/anxiety Limiting pt's independence with leisure/community pursuits.  Plan  Min1 TR session >20 minutes during LOS  Recommendations for other services: None   Discharge Criteria: Patient will be discharged from TR if patient refuses treatment 3 consecutive times without medical reason.  If treatment goals not met, if there is a change in medical status, if patient makes no progress towards goals or if patient is discharged from hospital.  The above assessment, treatment plan, treatment alternatives and goals were discussed and mutually agreed upon: by patient  [place] 09/20/2020, 3:46 PM

## 2020-09-21 LAB — GLUCOSE, CAPILLARY
Glucose-Capillary: 94 mg/dL (ref 70–99)
Glucose-Capillary: 53 mg/dL — ABNORMAL LOW (ref 70–99)
Glucose-Capillary: 161 mg/dL — ABNORMAL HIGH (ref 70–99)
Glucose-Capillary: 283 mg/dL — ABNORMAL HIGH (ref 70–99)
Glucose-Capillary: 188 mg/dL — ABNORMAL HIGH (ref 70–99)

## 2020-09-21 NOTE — Progress Notes (Signed)
Physical Therapy Session Note  Patient Details  Name: Diane Willis MRN: [id number] Date of Birth: 1955-08-19  Today's Date: 09/21/2020 PT Individual Time: 1100-1200 PT Individual Time Calculation (min): 60 min   Short Term Goals: Week 2:  PT Short Term Goal 1 (Week 2): Pt will perform least restrictive transfer with min A PT Short Term Goal 2 (Week 2): Pt will initiate gait training outside of // bars PT Short Term Goal 3 (Week 2): Pt will perform w/c mobility x 50 ft PT Short Term Goal 4 (Week 2): Pt will tolerate sitting up OOB x 2 hours  Skilled Therapeutic Interventions/Progress Updates: Pt presented in w/c with husband [name] present agreeable to therapy. Pt denies pain at start of session. Discussed with pt and husband continued progression of therapy (increase ambulation, begin stairs, perform car transfer), pt and husband verbalized understanding. Pt transported to day room and performed stand pivot w/c to mat with CGA and min multimodal cues for anterior weight shifting. Pt then performed block practice of Sit to stand from lowered mat with PTA using both RW and standard chair to increase anterior weight shifting and requiring pt to decrease bracing against mat. Pt then participated in gait training ~79f x 2 with seated rest WITHOUT W/C FOLLOW! Pt required cues for increasing BOS, however no increased posterior lean nor LOB. Pt did require increased time between bouts for recovery. Pt also participated in x` round of corn hole with pt able to reach minimally outside BOS without LOB or heavy posterior lean. Pt performed stand pivot back to w/c CGA and transported back to room.In room pt performed stand pivot to recliner in same manner as prior. Pt left in recliner at end of session with belt alarm on, call bell within reach and needs met.      Therapy Documentation Precautions:  Precautions Precautions: Fall Precaution Comments: ataxic, sensation of falling with movement,  anxious Restrictions Weight Bearing Restrictions: No    Therapy/Group: Individual Therapy  Floy Riegler Ellysa Parrack, PTA  09/21/2020, 12:38 PM

## 2020-09-21 NOTE — Progress Notes (Signed)
Occupational Therapy Session Note  Patient Details  Name: Diane Willis MRN: [id number] Date of Birth: March 15, 1955  Today's Date: 09/21/2020 OT Individual Time: 0938-1829 OT Individual Time Calculation (min): 54 min    Skilled Therapeutic Interventions/Progress Updates:    Pt greeted in the recliner with no c/o pain. Stated that she had just soiled her brief. Pt declined to attempt to use the restroom. She transferred via [facility] with CGA to the w/c. Worked on sit<stands and standing endurance while using the RW in front of the sink for toileting tasks. Min A and vcs for sit<stands, pt did well with visual cues for hip extension to prevent w/c from tipping backwards. Pt able to complete frontal and posterior hygiene with Min balance assist, did need more assistance for clothing mgt with unilateral support on the walker. Afterwards we worked on [employer] strengthening/endurance while self propelling w/c to the dayroom. She wanted to strengthen her legs using the kinetron, able to participate in sustained activity for 5 min and then 2 minute intervals at 60 cm/sec. Pt then self propelled back to the room and completed a stand pivot<recliner with RW and Min A. She remained sitting up comfortably with all needs within reach and safety belt fastened.    Therapy Documentation Precautions:  Precautions Precautions: Fall Precaution Comments: ataxic, sensation of falling with movement, anxious Restrictions Weight Bearing Restrictions: No  Vital Signs: Therapy Vitals Temp: 98.8 F (37.1 C) Temp Source: Oral Pulse Rate: 69 Resp: 16 BP: (!) 126/56 Patient Position (if appropriate): Sitting Oxygen Therapy SpO2: 100 % O2 Device: Room Air   ADL: ADL Upper Body Bathing: Supervision/safety Where Assessed-Upper Body Bathing: Wheelchair Lower Body Bathing: Maximal assistance Where Assessed-Lower Body Bathing: Standing at sink, Sitting at sink (stedy) Upper Body Dressing: Minimal assistance Where  Assessed-Upper Body Dressing: Wheelchair Lower Body Dressing: Maximal assistance Where Assessed-Lower Body Dressing: Sitting at sink, Standing at sink (stedy) Toileting: Maximal assistance Where Assessed-Toileting: Bedside Commode Toilet Transfer: Dependent (stedy- MIN-MOD A to power up)    Therapy/Group: Individual Therapy  Concepcion Kirkpatrick A Maxximus Gotay 09/21/2020, 4:18 PM

## 2020-09-21 NOTE — Progress Notes (Signed)
PROGRESS NOTE   Subjective/Complaints:  Pt reports no more HA's- bowel program going OK.  No issues. Is doing well.    ROS:  Pt denies SOB, abd pain, CP, N/V/C/D, and vision changes    Objective:   No results found. Recent Labs    09/20/20 0459  WBC 7.0  HGB 8.7*  HCT 28.2*  PLT 262   No results for input(s): NA, K, CL, CO2, GLUCOSE, BUN, CREATININE, CALCIUM in the last 72 hours.    Intake/Output Summary (Last 24 hours) at 09/21/2020 0823 Last data filed at 09/21/2020 0808 Gross per 24 hour  Intake 480 ml  Output --  Net 480 ml        Physical Exam: Vital Signs Blood pressure 137/66, pulse 70, temperature 98.9 F (37.2 C), temperature source Oral, resp. rate 16, height 5\' 1"  (1.549 m), SpO2 100 %.       General: awake, alert, appropriate, flat affect,NAD HENT: conjugate gaze; oropharynx moist CV: regular rate; no JVD Pulmonary: CTA B/L; no W/R/R- good air movement GI: soft, NT, ND, (+)BS Psychiatric: appropriate; flat affect Neurological: alert- will not let look at feet Decreased to light touch in hands and below knees B/L - no change Musculoskeletal:     Cervical back: Normal range of motion. No rigidity.     Comments: RUE- 4/5 except FA 4/5 LUE- 4/5 except FA 4-/5 RLE- HF 3-/5, at least 3/5 otherwise- so painful, cannot tolerate me touching her feet/legs LLE- HF 3+/5 -otherwise at least 3/5  Skin:    Comments: Bilateral fingers and palms with discoloration and mild edema.  Slightly cool to touch Buttocks and privates- no skin breakdown  Will NOT let us assess her feet/take socks off.  Everyday, try to check- pt refuses. Wearing different socks today.   Assessment/Plan: 1. Functional deficits which require 3+ hours per day of interdisciplinary therapy in a comprehensive inpatient rehab setting. Physiatrist is providing close team supervision and 24 hour management of active medical  problems listed below. Physiatrist and rehab team continue to assess barriers to discharge/monitor patient progress toward functional and medical goals  Care Tool:  Bathing    Body parts bathed by patient: Right arm, Left arm, Chest, Abdomen, Front perineal area, Right upper leg, Left upper leg (bed level for LB and seated in w/c for UB)   Body parts bathed by helper: Buttocks, Right lower leg, Left lower leg Body parts n/a: Right lower leg, Left lower leg (pt refuses to remove socks)   Bathing assist Assist Level: Maximal Assistance - Patient 24 - 49%     Upper Body Dressing/Undressing Upper body dressing   What is the patient wearing?: Pull over shirt    Upper body assist Assist Level: Contact Guard/Touching assist    Lower Body Dressing/Undressing Lower body dressing      What is the patient wearing?: Incontinence brief     Lower body assist Assist for lower body dressing: Moderate Assistance - Patient 50 - 74% (standing in [place])     [place] assist Assist for toileting: Maximal Assistance - Patient 25 - 49% (Bedlevel)     Transfers Chair/bed transfer  Transfers assist     Chair/bed transfer assist level: Minimal Assistance - Patient > 75% (stand pivot with RW)     Locomotion Ambulation   Ambulation assist   Ambulation activity did not occur: Safety/medical concerns (Anxiety, BLE weakness, SCI)  Assist level: 2 helpers Assistive device: Walker-rolling Max distance: 5'   Walk 10 feet activity   Assist  Walk 10 feet activity did not occur: Safety/medical concerns (Anxiety, BLE weakness, SCI)        Walk 50 feet activity   Assist Walk 50 feet with 2 turns activity did not occur: Safety/medical concerns (Anxiety, BLE weakness, SCI)         Walk 150 feet activity   Assist Walk 150 feet activity did not occur: Safety/medical concerns (Anxiety, BLE weakness, SCI)         Walk 10 feet on uneven surface   activity   Assist Walk 10 feet on uneven surfaces activity did not occur: Safety/medical concerns (Anxiety, BLE weakness, SCI)         Wheelchair     Assist Is the patient using a wheelchair?: Yes Type of Wheelchair: Manual Wheelchair activity did not occur: Safety/medical concerns (High anxiety, fear-avoidance behavior)  Wheelchair assist level: Minimal Assistance - Patient > 75% Max wheelchair distance: 150'    Wheelchair 50 feet with 2 turns activity    Assist    Wheelchair 50 feet with 2 turns activity did not occur: Safety/medical concerns (High anxiety, fear-avoidance behavior)   Assist Level: Minimal Assistance - Patient > 75%   Wheelchair 150 feet activity     Assist  Wheelchair 150 feet activity did not occur: Safety/medical concerns (High anxiety, fear-avoidance behavior)   Assist Level: Minimal Assistance - Patient > 75%   Blood pressure 137/66, pulse 70, temperature 98.9 F (37.2 C), temperature source Oral, resp. rate 16, height 5\' 1"  (1.549 m), SpO2 100 %.  Medical Problem List and Plan: 1.  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  2.  Antithrombotics: -DVT/anticoagulation:  Mechanical: Sequential compression devices, below knee Bilateral lower extremities since Plts are 66k 8/27- Plts up to 130k- if they stay up on Monday, will try Lovenox- esp with hx of PE. Dopplers (-) on 8/27 8/28- labs qmonday AM 8/30- plts up to 227k - will start Lovenox and monitor closely. Ordered qmonday/Thursday CBC-diff 9/1- Plts 290k- doing much better             -antiplatelet therapy: ASA             --H/o PE last year- 3. Pain Management: Tylenol prn.  4. Mood: LCSW to follow for evaluation and support.              -antipsychotic agents: N/A 5.  Neuropsych: This patient is capable of making decisions on his own behalf. 6. Skin/Wound Care: Routine pressure relief measures.  7. Fluids/Electrolytes/Nutrition: Monitor I/O. Check lytes in am.  8.  T2DM: Hgb A1C-7.8--monitor BS ac/hs and use SSI for elevated Bs             --Continue 70/30 insulin 25 units bid.   8/27- BG's running 200s-300s- will increase 70/30 to 30 units BID  9/1- BG's  297-989- will increase to 29 units- at 30 units, had very low BG 2 days ago in AM  9/7- doesn't want meal time insulin- BG's 105-205- doing better- con't regimen- had OJ in room, FYI  9/8- will decrease to 29 units BID and add nighttime snack due to some hypoglycemia in last 24 hours.   9/9- BG's 79 to 196 in last 24 hours since changed regimen- con't regimen 9. Vitamin B12: B12< 50.               --Continue B12 1000 mcg daily X 7 days-->weekly for 7 weeks then monthly.  --lifelong B12 1000 mcg monthly supplement after loading doses.  10. 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 Thrombocytopenia: Platelets with steady drop 98 --> 68.             --was 149-171 range earlier this month and not on any heparin products.              --pancytopenia noted-->drop .   8/30- Plts up to 227k- started lovenox  9/2 plts 290k on lovenox 11. Vitamin B1 deficiency: Thiamine 53.1--continue supplement.  12. Anxiety d/o: Continue Xanax prn.  13. 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  8/27- having accidents- of bowel and bladder- will d/w pt tomorrow about bowel program, and timed voiding  8/28- will order timed voiding q2 hours while awake.  having bowel incontinence,   9/2- tolerating bowel program- but not documented- let nursing know- and timed voiding helping- PVR 166cc x1  9/7- need husband trained in bowel program  9/8-= per staff, she's voiding on self and unconcerned about it. On timed voiding,  but even with staff in room, still voids.   9/9- doing bowel program, but uninterested in staying dry.  15.  Spasticity-  9/5- will increase baclofen to 10 mg TID. For extensor tone. 9/6- doing better- con't regimen 16. Anxiety/fear of falling- 9/6- will d/w pt tomorrow about meds that could help this-  9/7- will start Paxil 20 mg nightly for anxiety since already on Xanax prn     LOS: 15 days A FACE TO FACE EVALUATION WAS PERFORMED  Mahad Newstrom 09/21/2020, 8:23 AM

## 2020-09-21 NOTE — Progress Notes (Signed)
Physical Therapy Session Note  Patient Details  Name: Diane Willis MRN: [id number] Date of Birth: January 20, 1955  Today's Date: 09/21/2020 PT Individual Time: 1730-1740 PT Individual Time Calculation (min): 10 min   Short Term Goals:  Week 2:  PT Short Term Goal 1 (Week 2): Pt will perform least restrictive transfer with min A PT Short Term Goal 2 (Week 2): Pt will initiate gait training outside of // bars PT Short Term Goal 2 - Progress (Week 2): Met PT Short Term Goal 3 (Week 2): Pt will perform w/c mobility x 50 ft PT Short Term Goal 3 - Progress (Week 2): Met PT Short Term Goal 4 (Week 2): Pt will tolerate sitting up OOB x 2 hours PT Short Term Goal 4 - Progress (Week 2): Met   Skilled Therapeutic Interventions/Progress Updates:   Pt received sitting in recliner and agreeable to PT. Pt agreeable to attempt therapy. Sit<>stand with min assist and RW. Cues for UE placement to improve success. Gait training in hall x 88f with min assist for safety. Cues for wide BOS, improved posture, and step length. Mild improvement following instruction from PT. Patient returned to room and performed stand pivot to recliner with min assist and cues for knee flexion prior to sitting to prevent posterior LOB. Pt left sitting in recliner with call bell in reach and all needs met.         Therapy Documentation Precautions:  Precautions Precautions: Fall Precaution Comments: ataxic, sensation of falling with movement, anxious Restrictions Weight Bearing Restrictions: No General: PT Amount of Missed Time (min): 20 Minutes PT Missed Treatment Reason: Patient fatigue Vital Signs: Therapy Vitals Temp: 98.6 F (37 C) Temp Source: Oral Pulse Rate: 66 Resp: 17 BP: (!) 142/62 Patient Position (if appropriate): Lying Oxygen Therapy SpO2: 100 % O2 Device: Room Air Pain: Pain Assessment Pain Scale: 0-10 Pain Score: 0-No pain   Therapy/Group: Individual Therapy  ALorie Phenix9/09/2020,  10:23 PM

## 2020-09-21 NOTE — Progress Notes (Signed)
Physical Therapy Weekly Progress Note  Patient Details  Name: SHANIA BJELLAND MRN: [id number] Date of Birth: [birth year]  Beginning of progress report period: September 14, 2020 End of progress report period: September 21, 2020  Today's Date: 09/21/2020  Patient has met 4 of 4 short term goals.  Pt has made slow but steady gains during current course of therapy. Pt continues to be limited by significant anxiety. Pt does continue to demonstrate some extensor tone but is significantly improved with introduction of medical management. Pt has begun consistently performing Sit to stands from varying surfaces with CGA and with extensive encouragement is able to perform a stand pivot transfer with minA using RW. Pt has ambulated twice during current course of therapy both with minA/CGA but becomes self limiting due to anxiety.   Patient continues to demonstrate the following deficits muscle weakness and muscle paralysis and impaired timing and sequencing, abnormal tone, unbalanced muscle activation, and ataxia and therefore will continue to benefit from skilled PT intervention to increase functional independence with mobility.  Patient progressing toward long term goals..  Continue plan of care.  PT Short Term Goals Week 2:  PT Short Term Goal 1 (Week 2): Pt will perform least restrictive transfer with min A PT Short Term Goal 1 - Progress (Week 2): Met PT Short Term Goal 2 (Week 2): Pt will initiate gait training outside of // bars PT Short Term Goal 2 - Progress (Week 2): Met PT Short Term Goal 3 (Week 2): Pt will perform w/c mobility x 50 ft PT Short Term Goal 3 - Progress (Week 2): Met PT Short Term Goal 4 (Week 2): Pt will tolerate sitting up OOB x 2 hours PT Short Term Goal 4 - Progress (Week 2): Met Week 3:  PT Short Term Goal 1 (Week 3): Pt will ambulate at least 35f using LRAD with min assist PT Short Term Goal 2 (Week 3): Pt will ascend/descend 4 (6" steps) with max assist using HRs PT  Short Term Goal 3 (Week 3): Pt will perform simulated car transfer using LRAD with mod assist  Ambulation/gait training;Community reintegration;DME/adaptive equipment instruction;Neuromuscular re-education;Psychosocial support;Stair training;UE/LE Strength taining/ROM;Wheelchair propulsion/positioning;Balance/vestibular training;Discharge planning;Functional electrical stimulation;Pain management;Therapeutic Activities;UE/LE Coordination activities;Cognitive remediation/compensation;Disease management/prevention;Functional mobility training;Patient/family education;Splinting/orthotics;Therapeutic Exercise;Visual/perceptual remediation/compensation;Skin care/wound management   Therapy Documentation Precautions:  Precautions Precautions: Fall Precaution Comments: ataxic, sensation of falling with movement, anxious Restrictions Weight Bearing Restrictions: No   Keilyn Nadal, PT, DPT Rosita DeChalus 09/21/2020, 8:00 AM

## 2020-09-21 NOTE — Progress Notes (Signed)
  Hypoglycemic Event  CBG: 53  Treatment: 8 oz juice/soda, pt also eating lunch and drinking lemonade  Symptoms: None  Follow-up CBG: Time:1320 CBG Result:161  Possible Reasons for Event: Unknown  Comments/MD notified: After pt finished lunch and consumed juice, blood sugar went up and no need to notify MD    Loleta Dicker

## 2020-09-22 DIAGNOSIS — K592 Neurogenic bowel, not elsewhere classified: Secondary | ICD-10-CM

## 2020-09-22 DIAGNOSIS — E119 Type 2 diabetes mellitus without complications: Secondary | ICD-10-CM

## 2020-09-22 DIAGNOSIS — N319 Neuromuscular dysfunction of bladder, unspecified: Secondary | ICD-10-CM

## 2020-09-22 LAB — GLUCOSE, CAPILLARY
Glucose-Capillary: 111 mg/dL — ABNORMAL HIGH (ref 70–99)
Glucose-Capillary: 198 mg/dL — ABNORMAL HIGH (ref 70–99)
Glucose-Capillary: 211 mg/dL — ABNORMAL HIGH (ref 70–99)
Glucose-Capillary: 311 mg/dL — ABNORMAL HIGH (ref 70–99)

## 2020-09-22 MED ORDER — INSULIN ASPART PROT & ASPART (70-30 MIX) 100 UNIT/ML ~~LOC~~ SUSP
29.0000 [IU] | Freq: Every day | SUBCUTANEOUS | Status: DC
  Administered 2020-09-23 – 2020-09-25 (×3): 29 [IU] via SUBCUTANEOUS
  Filled 2020-09-22: qty 10

## 2020-09-22 MED ORDER — INSULIN ASPART PROT & ASPART (70-30 MIX) 100 UNIT/ML ~~LOC~~ SUSP
24.0000 [IU] | Freq: Every day | SUBCUTANEOUS | Status: DC
  Administered 2020-09-23 – 2020-09-26 (×4): 24 [IU] via SUBCUTANEOUS
  Filled 2020-09-22: qty 10

## 2020-09-22 NOTE — Progress Notes (Signed)
Physical Therapy Session Note  Patient Details  Name: Diane Willis MRN: [id number] Date of Birth: 11-27-1955  Today's Date: 09/22/2020 PT Individual Time: 0254-2706 PT Individual Time Calculation (min): 68 min   Short Term Goals: Week 3:  PT Short Term Goal 1 (Week 3): Pt will ambulate at least 3ft using LRAD with min assist PT Short Term Goal 2 (Week 3): Pt will ascend/descend 4 (6" steps) with max assist using HRs PT Short Term Goal 3 (Week 3): Pt will perform simulated car transfer using LRAD with mod assist  Skilled Therapeutic Interventions/Progress Updates:    Pt received sitting in recliner and agreeable to therapy session. Sit>stand recliner>RW with CGA for steadying and pt relying heavily on backs of legs on recliner to come to stand. R stand pivot recliner>w/c using RW with CGA until needing to step back towards w/c required min assist due to heavy posterior lean. Therapist had placed Roho cushion in w/c but it caused floor-to-seat height to be too high and was not stable when pt went to sit putting her at risk for sliding out of the w/c therefore removed it from pt's room. Transported to/from gym in w/c for time management and energy conservation.   Sit<>stands using RW with varying CGA/min assist during session with pt demonstrating lack of sufficient anterior trunk flexion and weight shift while coming to stand often pushing backs of legs strongly against seating surface (possible contribution from extensor tone).  Gait training 66ft 2x using RW with light min assist for balance - demos slow gait speed with slight pauses in double limb support for stability - good carryover of cuing to maintain wider/normal BOS. When turning to sit at end of gait would require heavy mod/max assist to maintain balance due to strong posterior lean when attempting to step backwards towards seat - requires max cuing to maintain hips tucked underneath her.  Gait training 67ft using RW with light min  assist for balance focusing on AD management via 3 cone weaving - continues to have slow, guarded gait pattern but demos safe, small turns of AD.  Initiated stair training via repeated forward/backwards step up/down on/off 1st 3" step using B HRs with light min assist. Progressed to ascending/descending 8 steps (3" height) using BHRs with step-to progressed to reciprocal pattern on ascent with min assist but then on descent performed step-to pattern with heavy max assist (retrieved +2 assist for safety) as pt leans heavily posteriorly flexing at hips requiring repeated cuing to tuck hips and move hands forward on HRs to promote anterior weight shift. At bottom of stairs provided pt with RW for B UE support in order to turn and sit in w/c with mod assist.   Pt reports feeling as though she may have low blood sugar - NT notified and assessed but WNL. Transported back to room. Stand pivot w/c>recliner using RW with light min assist as pt appears to have increased comfort transferring to recliner because she does not lean posteriorly when stepping back towards it as was noted throughout all other transfers during sessio. Pt left seated in recliner with needs in reach, B LEs elevated, and seat belt alarm on.  Therapy Documentation Precautions:  Precautions Precautions: Fall Precaution Comments: ataxic, sensation of falling with movement, anxious Restrictions Weight Bearing Restrictions: No   Pain:  No reports of pain throughout session.   Therapy/Group: Individual Therapy  Ginny Forth , PT, DPT, NCS, CSRS 09/22/2020, 12:22 PM

## 2020-09-22 NOTE — Progress Notes (Signed)
Bowel program is completed. Pt tolerated well. Pt had 2 BM, one large- type 6; 1 small-type 6

## 2020-09-22 NOTE — Progress Notes (Signed)
PROGRESS NOTE   Subjective/Complaints: Pt reports that she's getting sensation with bladder is full. Denies pain. Slept well  ROS: Patient denies fever, rash, sore throat, blurred vision, nausea, vomiting, diarrhea, cough, shortness of breath or chest pain, joint or back pain, headache, or mood change.     Objective:   No results found. Recent Labs    09/20/20 0459  WBC 7.0  HGB 8.7*  HCT 28.2*  PLT 262   No results for input(s): NA, K, CL, CO2, GLUCOSE, BUN, CREATININE, CALCIUM in the last 72 hours.    Intake/Output Summary (Last 24 hours) at 09/22/2020 0929 Last data filed at 09/21/2020 1801 Gross per 24 hour  Intake 240 ml  Output --  Net 240 ml        Physical Exam: Vital Signs Blood pressure 130/68, pulse 72, temperature 98.7 F (37.1 C), temperature source Oral, resp. rate 18, height 5\' 1"  (1.549 m), SpO2 100 %.  Constitutional: No distress . Vital signs reviewed. HEENT: NCAT, EOMI, oral membranes moist Neck: supple Cardiovascular: RRR without murmur. No JVD    Respiratory/Chest: CTA Bilaterally without wheezes or rales. Normal effort    GI/Abdomen: BS +, non-tender, non-distended Ext: no clubbing, cyanosis, or edema Psych: pleasant and cooperative  Neurological: alert- will not let look at feet Decreased to light touch in hands and below knees B/L - stable     Comments: RUE- 4/5 except FA 4/5 LUE- 4/5 except FA 4-/5 RLE- HF 3-/5, at least 3/5 otherwise- still sensitive to touch/manipulation LLE- HF 3+/5 -otherwise at least 3/5  Skin:    Comments: Bilateral fingers and palms with discoloration and mild edema.        Assessment/Plan: 1. Functional deficits which require 3+ hours per day of interdisciplinary therapy in a comprehensive inpatient rehab setting. Physiatrist is providing close team supervision and 24 hour management of active medical problems listed below. Physiatrist and rehab team  continue to assess barriers to discharge/monitor patient progress toward functional and medical goals  Care Tool:  Bathing    Body parts bathed by patient: Right arm, Left arm, Chest, Abdomen, Front perineal area, Right upper leg, Left upper leg (bed level for LB and seated in w/c for UB)   Body parts bathed by helper: Buttocks, Right lower leg, Left lower leg Body parts n/a: Right lower leg, Left lower leg (pt refuses to remove socks)   Bathing assist Assist Level: Maximal Assistance - Patient 24 - 49%     Upper Body Dressing/Undressing Upper body dressing   What is the patient wearing?: Pull over shirt    Upper body assist Assist Level: Set up assist    Lower Body Dressing/Undressing Lower body dressing      What is the patient wearing?: Incontinence brief, Pants     Lower body assist Assist for lower body dressing: Moderate Assistance - Patient 50 - 74% (using reacher)     Toileting Toileting    Toileting assist Assist for toileting: Maximal Assistance - Patient 25 - 49% (Bedlevel)     Transfers Chair/bed transfer  Transfers assist     Chair/bed transfer assist level: Contact Guard/Touching assist     Locomotion  Ambulation   Ambulation assist   Ambulation activity did not occur: Safety/medical concerns (Anxiety, BLE weakness, SCI)  Assist level: Contact Guard/Touching assist Assistive device: Walker-rolling Max distance: 59ft   Walk 10 feet activity   Assist  Walk 10 feet activity did not occur: Safety/medical concerns (Anxiety, BLE weakness, SCI)  Assist level: Contact Guard/Touching assist Assistive device: Walker-rolling   Walk 50 feet activity   Assist Walk 50 feet with 2 turns activity did not occur: Safety/medical concerns         Walk 150 feet activity   Assist Walk 150 feet activity did not occur: Safety/medical concerns (Anxiety, BLE weakness, SCI)         Walk 10 feet on uneven surface  activity   Assist Walk 10 feet  on uneven surfaces activity did not occur: Safety/medical concerns (Anxiety, BLE weakness, SCI)         Wheelchair     Assist Is the patient using a wheelchair?: Yes Type of Wheelchair: Manual Wheelchair activity did not occur: Safety/medical concerns (High anxiety, fear-avoidance behavior)  Wheelchair assist level: Minimal Assistance - Patient > 75% Max wheelchair distance: 150'    Wheelchair 50 feet with 2 turns activity    Assist    Wheelchair 50 feet with 2 turns activity did not occur: Safety/medical concerns (High anxiety, fear-avoidance behavior)   Assist Level: Minimal Assistance - Patient > 75%   Wheelchair 150 feet activity     Assist  Wheelchair 150 feet activity did not occur: Safety/medical concerns (High anxiety, fear-avoidance behavior)   Assist Level: Minimal Assistance - Patient > 75%   Blood pressure 130/68, pulse 72, temperature 98.7 F (37.1 C), temperature source Oral, resp. rate 18, height 5\' 1"  (1.549 m), SpO2 100 %.  Medical Problem List and Plan: 1.  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  Antithrombotics: -DVT/anticoagulation:  Mechanical: Sequential compression devices, below knee Bilateral lower extremities since Plts are 66k 8/27- Plts up to 130k- if they stay up on Monday, will try Lovenox- esp with hx of PE. Dopplers (-) on 8/27 8/28- labs qmonday AM 8/30- plts up to 227k - will start Lovenox and monitor closely. Ordered qmonday/Thursday CBC-diff 9/1- Plts 290k- doing much better             -antiplatelet therapy: ASA             --H/o PE last year- 3. Pain Management: Tylenol prn.  4. Mood: LCSW to follow for evaluation and support.              -antipsychotic agents: N/A 5. Neuropsych: This patient is capable of making decisions on his own  behalf. 6. Skin/Wound Care: Routine pressure relief measures.  7. Fluids/Electrolytes/Nutrition: Monitor I/O. Check lytes in am.  8.  T2DM: Hgb A1C-7.8--monitor BS ac/hs and use SSI for elevated Bs             --Continue 70/30 insulin 25 units bid.   8/27- BG's running 200s-300s- will increase 70/30 to 30 units BID  9/1- BG's 160-213- will increase to 29 units- at 30 units, had very low BG 2 days ago in AM  9/7- doesn't want meal time insulin- BG's 105-205- doing better- con't  regimen- had OJ in room, FYI  9/8- will decrease to 29 units BID and add nighttime snack due to some hypoglycemia in last 24 hours.   9/10- sugars still labile   -suspect hyperglycemia d/t rx of hypoglycemia earlier in day at lunch   -reduce 70/30 insulin to 24u in am, keep 29 in pm  CBG (last 3)  Recent Labs    09/21/20 1633 09/21/20 2105 09/22/20 0555  GLUCAP 283* 188* 111*    9. Vitamin B12: B12< 50.               --Continue B12 1000 mcg daily X 7 days-->weekly for 7 weeks then monthly.  --lifelong B12 1000 mcg monthly supplement after loading doses.  10. 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 Thrombocytopenia: Platelets with steady drop 98 --> 68.             --was 149-171 range earlier this month and not on any heparin products.              --pancytopenia noted-->drop .   8/30- Plts up to 227k- started lovenox  9/2 plts 290k on lovenox 11. Vitamin B1 deficiency: Thiamine 53.1--continue supplement.  12. Anxiety d/o: Continue Xanax prn.  13. 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  8/27- having accidents- of bowel and bladder- will d/w pt tomorrow about bowel program, and timed voiding  8/28- will order timed voiding q2 hours while awake.  having bowel incontinence,   9/2- tolerating bowel program- but not documented- let nursing know- and timed voiding helping- PVR 166cc x1  9/7- need  husband trained in bowel program  9/8-= per staff, she's voiding on self and unconcerned about it. On timed voiding, but even with staff in room, still voids.   9/9-10- doing bowel program--seems to be working, but uninterested in staying dry.  15.  Spasticity-  9/5- will increase baclofen to 10 mg TID. For extensor tone. 9/6- doing better- con't regimen 16. Anxiety/fear of falling- 9/6- will d/w pt tomorrow about meds that could help this-  9/7- will start Paxil 20 mg nightly for anxiety since already on Xanax prn     LOS: 16 days A FACE TO FACE EVALUATION WAS PERFORMED  Ranelle Oyster 09/22/2020, 9:29 AM

## 2020-09-23 LAB — GLUCOSE, CAPILLARY
Glucose-Capillary: 280 mg/dL — ABNORMAL HIGH (ref 70–99)
Glucose-Capillary: 89 mg/dL (ref 70–99)
Glucose-Capillary: 160 mg/dL — ABNORMAL HIGH (ref 70–99)
Glucose-Capillary: 162 mg/dL — ABNORMAL HIGH (ref 70–99)

## 2020-09-23 NOTE — Plan of Care (Signed)
  Problem: Sit to Stand Goal: LTG:  Patient will perform sit to stand with assistance level (PT) Description: LTG:  Patient will perform sit to stand with assistance level (PT) Flowsheets (Taken 09/23/2020 1718) LTG: PT will perform sit to stand in preparation for functional mobility with assistance level: (upgrade due to progress) Supervision/Verbal cueing Note: upgrade due to progress   Problem: RH Bed to Chair Transfers Goal: LTG Patient will perform bed/chair transfers w/assist (PT) Description: LTG: Patient will perform bed to chair transfers with assistance (PT). Flowsheets (Taken 09/23/2020 1718) LTG: Pt will perform Bed to Chair Transfers with assistance level: (upgrade due to progress) Supervision/Verbal cueing Note: upgrade due to progress   Problem: RH Ambulation Goal: LTG Patient will ambulate in home environment (PT) Description: LTG: Patient will ambulate in home environment, # of feet with assistance (PT). Flowsheets (Taken 09/23/2020 1718) LTG: Pt will ambulate in home environ  assist needed:: (upgrade due to progress) Contact Guard/Touching assist LTG: Ambulation distance in home environment: 150 ft with LRAD Note: upgrade due to progress   Problem: RH Stairs Goal: LTG Patient will ambulate up and down stairs w/assist (PT) Description: LTG: Patient will ambulate up and down # of stairs with assistance (PT) Flowsheets (Taken 09/23/2020 1718) LTG: Pt will ambulate up/down stairs assist needed:: (upgrade due to progress) Minimal Assistance - Patient > 75% LTG: Pt will  ambulate up and down number of stairs: 4 stairs with 2 handrails Note: upgrade due to progress

## 2020-09-23 NOTE — Progress Notes (Signed)
Physical Therapy Session Note  Patient Details  Name: Diane Willis MRN: [id number] Date of Birth: 05-Jul-1955  Today's Date: 09/23/2020 PT Individual Time: 3159-4585; 9292-4462 PT Individual Time Calculation (min): 55 min and 45 min  Short Term Goals: Week 3:  PT Short Term Goal 1 (Week 3): Pt will ambulate at least 53ft using LRAD with min assist PT Short Term Goal 2 (Week 3): Pt will ascend/descend 4 (6" steps) with max assist using HRs PT Short Term Goal 3 (Week 3): Pt will perform simulated car transfer using LRAD with mod assist  Skilled Therapeutic Interventions/Progress Updates:    Session 1: Pt received supine in bed with nursing assisting with brief change due to urinary incontinence. Once pericare and brief change completed pt agreeable to PT session. No complaints of pain. Pt is max A to don pants at bed level, able to bridge in supine to pull pants up over hips. Supine to sit with Supervision from flat bed with increased time, use of bedrail. Pt is setup A to doff hospital gown and don new shirt while seated EOB, min A for sitting balance due to posterior lean and LOB without UE support. Sit to stand with min A to RW, stand pivot transfer to w/c with CGA. Pt is setup A for oral hygiene and washing her face while seated in w/c at sink. Sit to stand with min A to RW for remainder of session. Ambulation 2 x 74 ft with RW and light min A to CGA. Pt exhibits improved ability to maintain upright stance during gait, some cues needed to keep head upright. Pt with no LOB during gait and no posterior leaning noted. Pt does exhibit some BLE extensor tone when going to sit down, needs cues to unlock knees and reach back when sitting. Pt continues to exhibit improved ability to stand, perform transfers, and perform gait with RW. Pt left seated in recliner in room with needs in reach, quick release belt and chair alarm in place at end of session.  Session 2: Pt received seated in recliner in room,  agreeable to PT session. No complaints of pain. Sit to stand with CGA to RW. Stand pivot transfer to w/c with RW and CGA. Ambulation x 70 ft in therapy gym with RW and CGA to min A for balance. Standing alt L/R 3" step-ups x 5 reps each with BUE support on handrails and min A for balance. Progression to alt 6" step-ups x 5 reps B with B handrails and min A for balance. Pt able to ascend 4 x 6" stairs with 2 handrails and min A for balance and turn around on landing at top of stairs. Upon attempting to descend stairs pt has severe onset of anxiety with resulting extensor tone in BLE and trunk. Pt unable to maintain upright standing without assist x 2 for safety. Unable to calm patient down and convince her to descend 6" stairs forwards, laterally, or backwards. Pt able to be convinced to descend 3" stairs forwards. Pt able to descend 8 x 3" stairs with 2 handrails and assist x 2 for safety, decreased posterior lean and extensor tone noted. Pt able to stand pivot with RW to w/c at bottom of stairs with min A. Pt returned to recliner in room at end of session. Pt continues to exhibit improved tolerance for gait and stair training as compared to previous sessions but does continue to remain limited by anxiety with functional activities. Pt left seated in recliner in room  with needs in reach, quick release belt in place.   Therapy Documentation Precautions:  Precautions Precautions: Fall Precaution Comments: ataxic, sensation of falling with movement, anxious Restrictions Weight Bearing Restrictions: No      Therapy/Group: Individual Therapy   Peter Congo, PT, DPT, CSRS  09/23/2020, 12:28 PM

## 2020-09-23 NOTE — Progress Notes (Signed)
Patient's family educated on bowel program and he expressed understanding of the teaching. We continue to monitor.

## 2020-09-23 NOTE — Progress Notes (Signed)
PROGRESS NOTE   Subjective/Complaints: No new problems overnight. Pain controlled.  ROS: Patient denies fever, rash, sore throat, blurred vision, nausea, vomiting, diarrhea, cough, shortness of breath or chest pain, joint or back pain, headache, or mood change.     Objective:   No results found. No results for input(s): WBC, HGB, HCT, PLT in the last 72 hours.  No results for input(s): NA, K, CL, CO2, GLUCOSE, BUN, CREATININE, CALCIUM in the last 72 hours.    Intake/Output Summary (Last 24 hours) at 09/23/2020 0818 Last data filed at 09/22/2020 1730 Gross per 24 hour  Intake 654 ml  Output --  Net 654 ml        Physical Exam: Vital Signs Blood pressure 129/63, pulse 62, temperature 98.8 F (37.1 C), temperature source Oral, resp. rate 18, height 5\' 1"  (1.549 m), SpO2 100 %.  Constitutional: No distress . Vital signs reviewed. HEENT: NCAT, EOMI, oral membranes moist Neck: supple Cardiovascular: RRR without murmur. No JVD    Respiratory/Chest: CTA Bilaterally without wheezes or rales. Normal effort    GI/Abdomen: BS +, non-tender, non-distended Ext: no clubbing, cyanosis, or edema Psych: pleasant and cooperative  Neurological: alert- will not let look at feet Decreased to light touch in hands and below knees B/L - stable     Comments: RUE- 4/5 except FA 4/5 LUE- 4/5 except FA 4-/5 RLE- HF 3-/5, at least 3/5 otherwise- still sensitive to touch/manipulation LLE- HF 3+/5 -otherwise at least 3/5  Skin:    Comments: warm, intact        Assessment/Plan: 1. Functional deficits which require 3+ hours per day of interdisciplinary therapy in a comprehensive inpatient rehab setting. Physiatrist is providing close team supervision and 24 hour management of active medical problems listed below. Physiatrist and rehab team continue to assess barriers to discharge/monitor patient progress toward functional and medical  goals  Care Tool:  Bathing    Body parts bathed by patient: Right arm, Left arm, Chest, Abdomen, Front perineal area, Right upper leg, Left upper leg (bed level for LB and seated in w/c for UB)   Body parts bathed by helper: Buttocks, Right lower leg, Left lower leg Body parts n/a: Right lower leg, Left lower leg (pt refuses to remove socks)   Bathing assist Assist Level: Maximal Assistance - Patient 24 - 49%     Upper Body Dressing/Undressing Upper body dressing   What is the patient wearing?: Pull over shirt    Upper body assist Assist Level: Set up assist    Lower Body Dressing/Undressing Lower body dressing      What is the patient wearing?: Incontinence brief, Pants     Lower body assist Assist for lower body dressing: Moderate Assistance - Patient 50 - 74% (using reacher)     Toileting Toileting    Toileting assist Assist for toileting: Maximal Assistance - Patient 25 - 49% (Bedlevel)     Transfers Chair/bed transfer  Transfers assist     Chair/bed transfer assist level: Minimal Assistance - Patient > 75% Chair/bed transfer assistive device: Armrests, Walker   Locomotion Ambulation   Ambulation assist   Ambulation activity did not occur: Safety/medical concerns (Anxiety,  BLE weakness, SCI)  Assist level: Minimal Assistance - Patient > 75% Assistive device: Walker-rolling Max distance: 38ft   Walk 10 feet activity   Assist  Walk 10 feet activity did not occur: Safety/medical concerns (Anxiety, BLE weakness, SCI)  Assist level: Contact Guard/Touching assist Assistive device: Walker-rolling   Walk 50 feet activity   Assist Walk 50 feet with 2 turns activity did not occur: Safety/medical concerns         Walk 150 feet activity   Assist Walk 150 feet activity did not occur: Safety/medical concerns (Anxiety, BLE weakness, SCI)         Walk 10 feet on uneven surface  activity   Assist Walk 10 feet on uneven surfaces activity did  not occur: Safety/medical concerns (Anxiety, BLE weakness, SCI)         Wheelchair     Assist Is the patient using a wheelchair?: Yes Type of Wheelchair: Manual Wheelchair activity did not occur: Safety/medical concerns (High anxiety, fear-avoidance behavior)  Wheelchair assist level: Minimal Assistance - Patient > 75% Max wheelchair distance: 150'    Wheelchair 50 feet with 2 turns activity    Assist    Wheelchair 50 feet with 2 turns activity did not occur: Safety/medical concerns (High anxiety, fear-avoidance behavior)   Assist Level: Minimal Assistance - Patient > 75%   Wheelchair 150 feet activity     Assist  Wheelchair 150 feet activity did not occur: Safety/medical concerns (High anxiety, fear-avoidance behavior)   Assist Level: Minimal Assistance - Patient > 75%   Blood pressure 129/63, pulse 62, temperature 98.8 F (37.1 C), temperature source Oral, resp. rate 18, height 5\' 1"  (1.549 m), SpO2 100 %.  Medical Problem List and Plan: 1.  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  Antithrombotics: -DVT/anticoagulation:  Mechanical: Sequential compression devices, below knee Bilateral lower extremities since Plts are 66k 8/27- Plts up to 130k- if they stay up on Monday, will try Lovenox- esp with hx of PE. Dopplers (-) on 8/27 8/28- labs qmonday AM 8/30- plts up to 227k - will start Lovenox and monitor closely. Ordered qmonday/Thursday CBC-diff 9/1- Plts 290k- doing much better             -antiplatelet therapy: ASA             --H/o PE last year- 3. Pain Management: Tylenol prn.  4. Mood: LCSW to follow for evaluation and support.              -antipsychotic agents: N/A 5. Neuropsych: This patient is capable of making decisions on his own behalf. 6. Skin/Wound Care:  Routine pressure relief measures.  7. Fluids/Electrolytes/Nutrition: Monitor I/O. Check lytes in am.  8.  T2DM: Hgb A1C-7.8--monitor BS ac/hs and use SSI for elevated Bs             --Continue 70/30 insulin 25 units bid.   8/27- BG's running 200s-300s- will increase 70/30 to 30 units BID  9/1- BG's 160-213- will increase to 29 units- at 30 units, had very low BG 2 days ago in AM  9/7- doesn't want meal time insulin- BG's 105-205- doing better- con't regimen- had OJ in room, FYI  9/8- will decrease to  29 units BID and add nighttime snack due to some hypoglycemia in last 24 hours.   9/10-11- sugars still labile   -suspect hyperglycemia at end of last week d/t rx of hypoglycemia earlier in day at lunch   -reduced 70/30 insulin to 24u in am, keep 29 in pm   -no AM CBG yet recorded 9/11--monitor today  CBG (last 3)  Recent Labs    09/22/20 1407 09/22/20 1631 09/22/20 2123  GLUCAP 198* 211* 311*    9. Vitamin B12: B12< 50.               --Continue B12 1000 mcg daily X 7 days-->weekly for 7 weeks then monthly.  --lifelong B12 1000 mcg monthly supplement after loading doses.  10. 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 Thrombocytopenia: Platelets with steady drop 98 --> 68.             --was 149-171 range earlier this month and not on any heparin products.              --pancytopenia noted-->drop .   8/30- Plts up to 227k- started lovenox  9/2 plts 290k on lovenox 11. Vitamin B1 deficiency: Thiamine 53.1--continue supplement.  12. Anxiety d/o: Continue Xanax prn.  13. 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  8/27- having accidents- of bowel and bladder- will d/w pt tomorrow about bowel program, and timed voiding  8/28- will order timed voiding q2 hours while awake.  having bowel incontinence,   9/2- tolerating bowel program- but not documented- let nursing know- and timed  voiding helping- PVR 166cc x1  9/7- need husband trained in bowel program  9/8-= per staff, she's voiding on self and unconcerned about it. On timed voiding, but even with staff in room, still voids.   9/9-11- having bm's with PM bowel program until last night--seems to be working for the most part 15.  Spasticity-  9/5- will increase baclofen to 10 mg TID. For extensor tone. 9/6- doing better- con't regimen 16. Anxiety/fear of falling- 9/6- will d/w pt tomorrow about meds that could help this-  9/7- will start Paxil 20 mg nightly for anxiety since already on Xanax prn     LOS: 17 days A FACE TO FACE EVALUATION WAS PERFORMED  Ranelle Oyster 09/23/2020, 8:18 AM

## 2020-09-24 LAB — CBC WITH DIFFERENTIAL/PLATELET
Abs Immature Granulocytes: 0.03 K/uL (ref 0.00–0.07)
Basophils Absolute: 0 K/uL (ref 0.0–0.1)
Basophils Relative: 1 %
Eosinophils Absolute: 0.3 K/uL (ref 0.0–0.5)
Eosinophils Relative: 3 %
HCT: 29.3 % — ABNORMAL LOW (ref 36.0–46.0)
Hemoglobin: 9.3 g/dL — ABNORMAL LOW (ref 12.0–15.0)
Immature Granulocytes: 0 %
Lymphocytes Relative: 20 %
Lymphs Abs: 1.7 K/uL (ref 0.7–4.0)
MCH: 33.9 pg (ref 26.0–34.0)
MCHC: 31.7 g/dL (ref 30.0–36.0)
MCV: 106.9 fL — ABNORMAL HIGH (ref 80.0–100.0)
Monocytes Absolute: 0.6 K/uL (ref 0.1–1.0)
Monocytes Relative: 7 %
Neutro Abs: 6 K/uL (ref 1.7–7.7)
Neutrophils Relative %: 69 %
Platelets: 175 K/uL (ref 150–400)
RBC: 2.74 MIL/uL — ABNORMAL LOW (ref 3.87–5.11)
RDW: 13.8 % (ref 11.5–15.5)
WBC: 8.7 K/uL (ref 4.0–10.5)
nRBC: 0 % (ref 0.0–0.2)

## 2020-09-24 LAB — GLUCOSE, CAPILLARY
Glucose-Capillary: 99 mg/dL (ref 70–99)
Glucose-Capillary: 137 mg/dL — ABNORMAL HIGH (ref 70–99)
Glucose-Capillary: 105 mg/dL — ABNORMAL HIGH (ref 70–99)
Glucose-Capillary: 232 mg/dL — ABNORMAL HIGH (ref 70–99)
Glucose-Capillary: 186 mg/dL — ABNORMAL HIGH (ref 70–99)

## 2020-09-24 LAB — BASIC METABOLIC PANEL
Anion gap: 8 (ref 5–15)
BUN: 16 mg/dL (ref 8–23)
CO2: 23 mmol/L (ref 22–32)
Calcium: 9.1 mg/dL (ref 8.9–10.3)
Chloride: 108 mmol/L (ref 98–111)
Creatinine, Ser: 1.01 mg/dL — ABNORMAL HIGH (ref 0.44–1.00)
GFR, Estimated: >60 mL/min (ref >60)
Glucose, Bld: 134 mg/dL — ABNORMAL HIGH (ref 70–99)
Potassium: 4.7 mmol/L (ref 3.5–5.1)
Sodium: 139 mmol/L (ref 135–145)

## 2020-09-24 NOTE — Progress Notes (Signed)
Physical Therapy Session Note  Patient Details  Name: Diane Willis MRN: [id number] Date of Birth: 03-31-1955  Today's Date: 09/24/2020 PT Individual Time: 6295-2841; 3244-0102 PT Individual Time Calculation (min): 60 min and 15 min PT Missed Time: 15 min Missed Time Reason: therapist unavailable as with previously scheduled patient  Short Term Goals: Week 3:  PT Short Term Goal 1 (Week 3): Pt will ambulate at least 78ft using LRAD with min assist PT Short Term Goal 2 (Week 3): Pt will ascend/descend 4 (6" steps) with max assist using HRs PT Short Term Goal 3 (Week 3): Pt will perform simulated car transfer using LRAD with mod assist  Skilled Therapeutic Interventions/Progress Updates:    Session 1: Pt received seated in recliner in room, agreeable to PT session. No complaints of pain. Sit to stand and stand pivot transfer with RW and CGA to min A throughout session, cues for anterior weight shift. Ambulation x 75 ft, x 100 ft with RW and CGA for balance, cues for decreased reliance on BUE and for upright posture. Pt continues to exhibit improved endurance for gait training. Pt states she plans to "conquer" her fear of stairs this date and is determined to be able to ascend/descend. Standing alt L/R 6" step-ups with B handrails and min A for balance. Ascend/descend 4 x 6" stairs with 2 handrails and assist x 2 for safety (min/mod A x 1 and min A x 1). Pt requires cues to flex knee of LE she is not descending with for improved gait mechanics on stairs. Pt exhibits improved safety and ability to perform stairs this date with minimal posterior lean and extensor tone noted! Pt excited by her ongoing improvements in functional mobility. Sit to stand from low, pliable surface of couch in rehab apartment with min A needed to stand from low surface. Car transfer with RW and min A for balance, cues for safe transfer technique. Pt's husband [name] present for 2nd half of therapy session and able to observe  pt's improvements in overall functional status. Pt left seated in recliner in room with needs in reach, quick release belt in place, husband present at end of session.  Session 2: Pt received seated in recliner in room, agreeable to PT session. No complaints of pain. Sit to stand with min A to RW. Standing marches x 10 reps, mini-squats 2 x 10 reps with RW and CGA for balance, sidesteps L/R 2 x 10 ft each direction with RW and min A for balance. Pt continues to exhibit improved balance with all standing tasks. Pt left seated in recliner in room with needs in reach, quick release belt in place. Pt missed 15 min of scheduled therapy session due to this therapist running over time with previously scheduled patient.  Therapy Documentation Precautions:  Precautions Precautions: Fall Precaution Comments: ataxic, sensation of falling with movement, anxious Restrictions Weight Bearing Restrictions: No   Therapy/Group: Individual Therapy   Peter Congo, PT, DPT, CSRS  09/24/2020, 11:55 AM

## 2020-09-24 NOTE — Progress Notes (Signed)
Occupational Therapy Session Note  Patient Details  Name: Diane Willis MRN: [id number] Date of Birth: 12-15-1955  Today's Date: 09/24/2020 OT Individual Time: 2778-2423 OT Individual Time Calculation (min): 70 min    Short Term Goals: Week 3:  OT Short Term Goal 1 (Week 3): Pt will perform sit<>stand with mod A using RW in preparation for pulling pants over hips OT Short Term Goal 2 (Week 3): Pt will complete LB bathing with min A seated EOB using lateral leans OT Short Term Goal 3 (Week 3): Pt will complete toileting with mod A  Skilled Therapeutic Interventions/Progress Updates:    Pt resting in bed upon arrival and agreeable to getting OOB for therapy. Supine>sit EOB with supervision. Stand pivot transfer with RW to w/c with min A. Pt completed UB bathing and BLE seated in w/c at sink. Pt does not remove socks and perineal washed prior to therapy session. UB dressing with supervison. Pt required min A for threading RLE into pants and to pull over hips when standing with RW (standing with CGA). Pt transitioned to ADL apartment and practiced simulated walk-in shower transfers with min A. Pt pleased with progress. Pt returned in and amb ~5' to recliner. Pt remained in recliner with all needs within reach and belt alarm activated.    Therapy Documentation Precautions:  Precautions Precautions: Fall Precaution Comments: ataxic, sensation of falling with movement, anxious Restrictions Weight Bearing Restrictions: No   Pain:  Pt denies pain this morning  Therapy/Group: Individual Therapy  Rich Brave 09/24/2020, 9:31 AM

## 2020-09-24 NOTE — Progress Notes (Signed)
Physical Therapy Session Note  Patient Details  Name: Diane Willis MRN: [id number] Date of Birth: 1955/08/25  Today's Date: 09/24/2020 PT Individual Time: 1415-1445 PT Individual Time Calculation (min): 30 min   Short Term Goals: Week 3:  PT Short Term Goal 1 (Week 3): Pt will ambulate at least 99ft using LRAD with min assist PT Short Term Goal 2 (Week 3): Pt will ascend/descend 4 (6" steps) with max assist using HRs PT Short Term Goal 3 (Week 3): Pt will perform simulated car transfer using LRAD with mod assist  Skilled Therapeutic Interventions/Progress Updates:     Patient in recliner in the room upon PT arrival. Patient alert and agreeable to PT session. Patient denied pain during session.  Therapeutic Activity: Transfers: Patient performed sit to/from stand x2 with CGA-close supervision. Provided verbal cues for forward weight shift and scooting forward to prepare for standing due to mild posterior bias.  Gait Training:  Patient ambulated 168 feet x2 using RW with CGA and w/c follow due to decreased activity tolerance. Ambulated with narrow BOS, decreased step length and height L>R, significantly reduced gait speed, and increased upper extremity use. Provided verbal cues for reduced grip on RW to improve lower extremity strength and balance with functional mobility, increased BOS, and increased L step height with fatigue for improved foot clearance.  Neuromuscular Re-ed: Patient performed the following activities: -5 min on Kinetron at 40 cm/sec for improved lower extremity motor control reciprocal movement pattern   Patient in recliner in the room at end of session with breaks locked, seat belt alarm set, and all needs within reach.   Therapy Documentation Precautions:  Precautions Precautions: Fall Precaution Comments: ataxic, sensation of falling with movement, anxious Restrictions Weight Bearing Restrictions: No    Therapy/Group: Individual Therapy  [name]  Murriel Holwerda PT, DPT  09/24/2020, 3:55 PM

## 2020-09-24 NOTE — Plan of Care (Signed)
  Problem: RH Stairs Goal: LTG Patient will ambulate up and down stairs w/assist (PT) Description: LTG: Patient will ambulate up and down # of stairs with assistance (PT) Flowsheets (Taken 09/24/2020 1205) LTG: Pt will ambulate up/down stairs assist needed:: (changed due to clarification of home setup) -- LTG: Pt will  ambulate up and down number of stairs: 4 stairs with R handrail Note: Changed due to clarification of home setup

## 2020-09-24 NOTE — Progress Notes (Signed)
PROGRESS NOTE   Subjective/Complaints: Pt reports doing well-  walked the small steps with therapy- panicked on the large steps going down.   Went on toilet on her own 2-3x yesterday- bowels-  Interested in making bowel program as needed  ROS:  Pt denies SOB, abd pain, CP, N/V/C/D, and vision changes     Objective:   No results found. No results for input(s): WBC, HGB, HCT, PLT in the last 72 hours.  No results for input(s): NA, K, CL, CO2, GLUCOSE, BUN, CREATININE, CALCIUM in the last 72 hours.    Intake/Output Summary (Last 24 hours) at 09/24/2020 0817 Last data filed at 09/23/2020 1858 Gross per 24 hour  Intake 358 ml  Output --  Net 358 ml        Physical Exam: Vital Signs Blood pressure 137/66, pulse 67, temperature 98.7 F (37.1 C), temperature source Oral, resp. rate 16, height 5\' 1"  (1.549 m), SpO2 100 %.   General: awake, alert, appropriate, sitting up in bed;  NAD HENT: conjugate gaze; oropharynx moist CV: regular rate; no JVD Pulmonary: CTA B/L; no W/R/R- good air movement GI: soft, NT, ND, (+)BS Psychiatric: appropriate; flat, but more interactive than usual Neurological: alert  Ext: no clubbing, cyanosis, or edema Psych: pleasant and cooperative  Neurological: alert- will not let look at feet Decreased to light touch in hands and below knees B/L - stable     Comments: RUE- 4/5 except FA 4/5 LUE- 4/5 except FA 4-/5 RLE- HF 3-/5, at least 3/5 otherwise- still sensitive to touch/manipulation LLE- HF 3+/5 -otherwise at least 3/5  Skin:    Comments: warm, intact        Assessment/Plan: 1. Functional deficits which require 3+ hours per day of interdisciplinary therapy in a comprehensive inpatient rehab setting. Physiatrist is providing close team supervision and 24 hour management of active medical problems listed below. Physiatrist and rehab team continue to assess barriers to  discharge/monitor patient progress toward functional and medical goals  Care Tool:  Bathing    Body parts bathed by patient: Right arm, Left arm, Chest, Abdomen, Front perineal area, Right upper leg, Left upper leg (bed level for LB and seated in w/c for UB)   Body parts bathed by helper: Buttocks, Right lower leg, Left lower leg Body parts n/a: Right lower leg, Left lower leg (pt refuses to remove socks)   Bathing assist Assist Level: Maximal Assistance - Patient 24 - 49%     Upper Body Dressing/Undressing Upper body dressing   What is the patient wearing?: Pull over shirt    Upper body assist Assist Level: Set up assist    Lower Body Dressing/Undressing Lower body dressing      What is the patient wearing?: Incontinence brief, Pants     Lower body assist Assist for lower body dressing: Moderate Assistance - Patient 50 - 74% (using reacher)     Toileting Toileting    Toileting assist Assist for toileting: Maximal Assistance - Patient 25 - 49% (Bedlevel)     Transfers Chair/bed transfer  Transfers assist     Chair/bed transfer assist level: Minimal Assistance - Patient > 75% Chair/bed transfer assistive device:  Walker   Locomotion Ambulation   Ambulation assist   Ambulation activity did not occur: Safety/medical concerns (Anxiety, BLE weakness, SCI)  Assist level: Minimal Assistance - Patient > 75% Assistive device: Walker-rolling Max distance: 43'   Walk 10 feet activity   Assist  Walk 10 feet activity did not occur: Safety/medical concerns (Anxiety, BLE weakness, SCI)  Assist level: Minimal Assistance - Patient > 75% Assistive device: Walker-rolling   Walk 50 feet activity   Assist Walk 50 feet with 2 turns activity did not occur: Safety/medical concerns  Assist level: Minimal Assistance - Patient > 75% Assistive device: Walker-rolling    Walk 150 feet activity   Assist Walk 150 feet activity did not occur: Safety/medical concerns  (Anxiety, BLE weakness, SCI)         Walk 10 feet on uneven surface  activity   Assist Walk 10 feet on uneven surfaces activity did not occur: Safety/medical concerns (Anxiety, BLE weakness, SCI)         Wheelchair     Assist Is the patient using a wheelchair?: Yes Type of Wheelchair: Manual Wheelchair activity did not occur: Safety/medical concerns (High anxiety, fear-avoidance behavior)  Wheelchair assist level: Minimal Assistance - Patient > 75% Max wheelchair distance: 150'    Wheelchair 50 feet with 2 turns activity    Assist    Wheelchair 50 feet with 2 turns activity did not occur: Safety/medical concerns (High anxiety, fear-avoidance behavior)   Assist Level: Minimal Assistance - Patient > 75%   Wheelchair 150 feet activity     Assist  Wheelchair 150 feet activity did not occur: Safety/medical concerns (High anxiety, fear-avoidance behavior)   Assist Level: Minimal Assistance - Patient > 75%   Blood pressure 137/66, pulse 67, temperature 98.7 F (37.1 C), temperature source Oral, resp. rate 16, height 5\' 1"  (1.549 m), SpO2 100 %.  Medical Problem List and Plan: 1.  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  Antithrombotics: -DVT/anticoagulation:  Mechanical: Sequential compression devices, below knee Bilateral lower extremities since Plts are 66k 8/27- Plts up to 130k- if they stay up on Monday, will try Lovenox- esp with hx of PE. Dopplers (-) on 8/27 8/28- labs qmonday AM 8/30- plts up to 227k - will start Lovenox and monitor closely. Ordered qmonday/Thursday CBC-diff 9/1- Plts 290k- doing much better 9/12- labs pending this AM- con't to monitor             -antiplatelet therapy: ASA             --H/o PE last year- 3. Pain Management: Tylenol prn.  4. Mood: LCSW to follow  for evaluation and support.              -antipsychotic agents: N/A 5. Neuropsych: This patient is capable of making decisions on his own behalf. 6. Skin/Wound Care: Routine pressure relief measures.  7. Fluids/Electrolytes/Nutrition: Monitor I/O. Check lytes in am.  8.  T2DM: Hgb A1C-7.8--monitor BS ac/hs and use SSI for elevated Bs             --Continue 70/30 insulin 25 units bid.   8/27- BG's running 200s-300s- will increase 70/30 to 30 units BID  9/1- BG's 160-213- will increase to 29  units- at 30 units, had very low BG 2 days ago in AM  9/7- doesn't want meal time insulin- BG's 105-205- doing better- con't regimen- had OJ in room, FYI  9/8- will decrease to 29 units BID and add nighttime snack due to some hypoglycemia in last 24 hours.   9/10-11- sugars still labile   -suspect hyperglycemia at end of last week d/t rx of hypoglycemia earlier in day at lunch   -reduced 70/30 insulin to 24u in am, keep 29 in pm   -no AM CBG yet recorded 9/11--monitor today  CBG (last 3)  Recent Labs    09/23/20 1657 09/23/20 2136 09/24/20 0613  GLUCAP 160* 162* 137*    9/12- BG's 89-162- doing well- con't regimen 9. Vitamin B12: B12< 50.               --Continue B12 1000 mcg daily X 7 days-->weekly for 7 weeks then monthly.  --lifelong B12 1000 mcg monthly supplement after loading doses.  10. 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 Thrombocytopenia: Platelets with steady drop 98 --> 68.             --was 149-171 range earlier this month and not on any heparin products.              --pancytopenia noted-->drop .   8/30- Plts up to 227k- started lovenox  9/2 plts 290k on lovenox 11. Vitamin B1 deficiency: Thiamine 53.1--continue supplement.  12. Anxiety d/o: Continue Xanax prn.  13. 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  8/27- having accidents- of bowel and bladder- will d/w pt  tomorrow about bowel program, and timed voiding  8/28- will order timed voiding q2 hours while awake.  having bowel incontinence,   9/2- tolerating bowel program- but not documented- let nursing know- and timed voiding helping- PVR 166cc x1  9/7- need husband trained in bowel program  9/8-= per staff, she's voiding on self and unconcerned about it. On timed voiding, but even with staff in room, still voids.   9/9-11- having bm's with PM bowel program until last night--seems to be working for the most part  9/12- will speak with nursing if still needs bowel program- probably for now, but if using toilet, things getting better 15.  Spasticity-  9/5- will increase baclofen to 10 mg TID. For extensor tone. 9/6- doing better- con't regimen 16. Anxiety/fear of falling- 9/6- will d/w pt tomorrow about meds that could help this-  9/7- will start Paxil 20 mg nightly for anxiety since already on Xanax prn  9/12- is doing better- but did panic trying to go down large step stairs- con't to monitor    LOS: 18 days A FACE TO FACE EVALUATION WAS PERFORMED  Diane Willis 09/24/2020, 8:17 AM

## 2020-09-24 NOTE — Progress Notes (Signed)
Bowel program started at 1815. Moderate mushy stool in rectum. Dulcolax suppository inserted at 1817. Pt assisted to toilet at 1845 and able to have moderate mushy stool. Rectal vault clear at this time. Pt tolerated well.   Marylu Lund, RN

## 2020-09-25 LAB — GLUCOSE, CAPILLARY
Glucose-Capillary: 138 mg/dL — ABNORMAL HIGH (ref 70–99)
Glucose-Capillary: 43 mg/dL — CL (ref 70–99)
Glucose-Capillary: 102 mg/dL — ABNORMAL HIGH (ref 70–99)
Glucose-Capillary: 160 mg/dL — ABNORMAL HIGH (ref 70–99)
Glucose-Capillary: 203 mg/dL — ABNORMAL HIGH (ref 70–99)
Glucose-Capillary: 171 mg/dL — ABNORMAL HIGH (ref 70–99)

## 2020-09-25 NOTE — Progress Notes (Signed)
Physical Therapy Session Note  Patient Details  Name: Diane Willis MRN: [id number] Date of Birth: 04/28/1955  Today's Date: 09/25/2020 PT Individual Time: 1130-1145; 1345-1445 PT Individual Time Calculation (min): 15 min and 60 min  Short Term Goals: Week 3:  PT Short Term Goal 1 (Week 3): Pt will ambulate at least 48ft using LRAD with min assist PT Short Term Goal 2 (Week 3): Pt will ascend/descend 4 (6" steps) with max assist using HRs PT Short Term Goal 3 (Week 3): Pt will perform simulated car transfer using LRAD with mod assist  Skilled Therapeutic Interventions/Progress Updates:    Session 1: Pt received seated in recliner in room, agreeable to PT session. No complaints of pain. Discussed team conference discussion and that pt still on target for d/c date of 9/21. Pt with questions regarding what focus of therapy sessions will be leading up to d/c. Educated pt that PT sessions will continue to focus on transfers, gait, and stairs as well as standing balance and decreased reliance on RW in standing. Also encouraged pt to think of anything else she needs to/wants to practice prior to d/c home. Discussed with patient that her family will need to complete one more family education session prior to d/c now that she has improved so much functionally since her last family education session. Pt understanding of all education. Pt reports she is feeling "hot" and begins fanning herself, requests her BG be checked. NT able to check BG, 43 mg/dL, NT notifies nursing of hypoglycemia. Provided apple juice and graham crackers, pt left in care of nursing. Pt missed 15 min of scheduled therapy session due to low blood sugar.  Session 2: Pt received seated in w/c in therapy gym handed off from OT session. No complaints of pain. Pt's husband Diane Willis present and able to observe therapy session and provide emotional support to patient. Sit to stand with CGA to RW. Ambulation x 25 ft with RW from w/c to therapy  mat in gym with CGA for balance. Session focus on standing balance with just one UE support on RW with min A needed overall while reaching outside BOS and across midline for targets. Pt exhibits posterior lean and B knee extension when reaching, needs cues for posture and weight shifting. Attempted to have pt perform gait again with RW to walk towards stairs, pt has onset of BLE and trunk extensor tone and is unable to remain standing safely and unable to ambulate due to tone. After seated rest break pt agreeable to attempting standing again, min A to stand to RW and pt again has onset of extensor tone in standing, unable to safely take steps. Pt requesting her blood sugar be checked in. NT able to assess BG, 160 mg/dL. Pt continues to exhibit extensor tone in sitting and standing, unable to safely transfer back to w/c via stand pivot transfer. Obtained stedy and pt able to transfer with stedy back to w/c. Pt requesting to use the bathroom. Stedy transfer to toilet. Pt continent of BM while seated on toilet, see Flowsheet for details. Pt requires assist for clothing management, is setup A for pericare. Stedy transfer back to recliner. Nursing notified of onset of extensor tone during session, pt due for Baclofen at end of session. Pt left seated in recliner in room with needs in reach, quick release belt and chair alarm in place, husband present. Unsure if pt's increase in tone this PM related to anxiety as pt has not exhibited tone to this degree  during past few therapy sessions.  Therapy Documentation Precautions:  Precautions Precautions: Fall Precaution Comments: ataxic, sensation of falling with movement, anxious Restrictions Weight Bearing Restrictions: No General: PT Amount of Missed Time (min): 15 Minutes PT Missed Treatment Reason: Patient ill (Comment) (hypoglycemic)     Therapy/Group: Individual Therapy   Peter Congo, PT, DPT, CSRS  09/25/2020, 11:47 AM

## 2020-09-25 NOTE — Progress Notes (Signed)
Patient ID: Diane Willis, female   DOB: Jun 07, 1955, 66 y.o.   MRN: [id number]  SW met with pt in room to provide updates from team conference, and d/c date remains 9/21. SW discussed another family education session prior to d/c. Pt will discuss with her husband when he will be available. SW to order RW. Preferred HHA- is [facility] at home. Pt understands if unable to accept, will find an agency that can.   SW ordered youth RW with [employer] via parachute.   Loralee Pacas, MSW, [place] Office: [phone] Cell: [phone] Fax: [phone]

## 2020-09-25 NOTE — Patient Care Conference (Signed)
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 Genice Rouge Social Worker Present: Cecile Sheerer, LCSWA Nurse Present: Kennyth Arnold, RN PT Present: Peter Congo, PT OT Present: Ardis Rowan, COTA;Jennifer Katrinka Blazing, OT PPS Coordinator present : Fae Pippin, SLP     Current Status/Progress Goal Weekly Team Focus  Bowel/Bladder   pt cont of B/B with episodes of incont. Pt on bowel program LBM 9/12  Pt to gain and remain cont of B/B.  Utilize timed toileting while awake.   Swallow/Nutrition/ Hydration             ADL's   bathing at sink level-mod A: LB dressing-mod A: functional transfers with RW-min A/CGA; toileting-min A;  min A to supervision overall  BADL retraining, funcitonal transfers, safety awareness   Mobility   Supervision bed mobility, transfers CGA to min A with RW, gait up to 100 ft with RW min A, +2 on 3" and 6" stairs with 2 handrails; extensor tone and anxiety improving  upgraded goals to Supervision overall, min A stairs  gait, stairs, balance, endurance   Communication             Safety/Cognition/ Behavioral Observations            Pain   Pt denies pain  Pt remain pain free  Assess for pain qshift and provide PRN meds as needed   Skin   Pt skin is  intact  Pt skin to remain clean and free of breakdown  Provide skin care qshift     Discharge Planning:  D/c to home with pt husband who will assist with care when he is not working, and his mother will be with patient primarily during the day.Fam edu completed on 9/8 and 9/9 with pt husband and MIL.   Team Discussion: Doing bowel program and going to toilet after. Platelets down to 175, recheck on Thursday. CBG's better, started on Paxil for anxiety. Will go home on Lovenox-begin teaching. Nursing is noting bowel program but inconsistent notes nightly. Tylenol for pain, Trazodone for sleep. Educating bowel program, timed toileting, Lovenox injections. Nursing has began educating spouse on bowel program. Therapy requesting husband and MIL come in for more teaching. Ambulating >100 ft with RW. 6 in steps completed yesterday with min assist, had +2 available for safety. Sink bathing in WC, min assist for lower body. Practice walk-in shower yesterday.  Patient on target to meet rehab goals: yes  *See Care Plan and progress notes for long and short-term goals.   Revisions to Treatment Plan:  Recheck labs on Thursday.  Teaching Needs: Family education, medication management, pain management, diabetes management, transfer training, gait training, balance training, endurance training, bowel/bladder education, Lovenox teaching, safety awareness.  Current Barriers to Discharge: Decreased caregiver support, Medical stability, Home  enviroment access/layout, Incontinence, Neurogenic bowel and bladder, Lack of/limited family support, Weight, and Medication compliance  Possible Resolutions to Barriers: Continue current medications, provide emotional support.     Medical Summary Current Status: anxiety feels better per pt- on Paxil 20 mg QHS; bowel program nightly- but going on toilet after dig stim/supposiotry; timed toileting; taught bowel program- needs lovenox teaching- skin good  Barriers to  Discharge: Behavior;Decreased family/caregiver support;Home enviroment access/layout;Incontinence;Neurogenic Bowel & Bladder;Medical stability;Weight  Barriers to Discharge Comments: husband/mother in law 24/7 care; limitations- anxiety/fear of falling; needs more family ed- walking >148ft now; Possible Resolutions to [employer] Focus: focus- bowel program- on toilet; and anxiety is doing better- extensor tone MUCH better- con't baclofen- ADLs min A at worst; d/c 9/21   Continued Need for Acute Rehabilitation Level of Care: The patient requires daily medical management by a physician with specialized training in physical medicine and rehabilitation for the following reasons: Direction of a multidisciplinary physical rehabilitation program to maximize functional independence : Yes Medical management of patient stability for increased activity during participation in an intensive rehabilitation regime.: Yes Analysis of laboratory values and/or radiology reports with any subsequent need for medication adjustment and/or medical intervention. : Yes   I attest that I was present, lead the team conference, and concur with the assessment and plan of the team.   Tennis Must 09/25/2020, 11:10 AM

## 2020-09-25 NOTE — Progress Notes (Signed)
Hypoglycemic Event  CBG: 43  Treatment: 8 oz juice/soda  Symptoms: Sweaty  Follow-up CBG: Time:1238 CBG Result:102  Possible Reasons for Event: Unknown  Comments/MD notified: Dan, PA notified. RN to wait until patient eats lunch to re-check CBG.    Diane Willis

## 2020-09-25 NOTE — Progress Notes (Signed)
Occupational Therapy Session Note  Patient Details  Name: Diane Willis MRN: [id number] Date of Birth: 1955/10/15  Today's Date: 09/25/2020 OT Individual Time: 9563-8756 OT Individual Time Calculation (min): 72 min    Short Term Goals: Week 3:  OT Short Term Goal 1 (Week 3): Pt will perform sit<>stand with mod A using RW in preparation for pulling pants over hips OT Short Term Goal 2 (Week 3): Pt will complete LB bathing with min A seated EOB using lateral leans OT Short Term Goal 3 (Week 3): Pt will complete toileting with mod A  Skilled Therapeutic Interventions/Progress Updates:    Pt resting in bed upon arrival. OT intervention with focus on bed mobility, sitting balance, standing balance, BADL training, functional amb with RW, and activity tolerance to increase independence with BADLs. Supine>sit EOB with supervision. Pt amb with RW from bed to sink and sat in w/c for bathing/dressing tasks. Pt required assistance with bathing buttocks and donning shoes. Pt uses reacher to assist with threading pants. Standing balance at sink with CGA. Pt transitioned to day room and engaged in game of corn hole while standing with RW-CGA. Pt stood at table to clean bean bags. Pt used one hand to clean and resistant to removing both hands from RW to clean bean bags. Pt returned to room and transferred to recliner. All needs within reach and belt alarm activated.   Therapy Documentation Precautions:  Precautions Precautions: Fall Precaution Comments: ataxic, sensation of falling with movement, anxious Restrictions Weight Bearing Restrictions: No   Pain: Pain Assessment Pain Scale: 0-10 Pain Score: 0-No pain   Therapy/Group: Individual Therapy  Rich Brave 09/25/2020, 9:29 AM

## 2020-09-25 NOTE — Progress Notes (Signed)
PROGRESS NOTE   Subjective/Complaints:  Pt reports doing well- had BM on toilet- but appears did AFTER suppository and dig stim- also using bathroom to void overnight.   Did better on large stairs yesterday.   ROS:   Pt denies SOB, abd pain, CP, N/V/C/D, and vision changes     Objective:   No results found. Recent Labs    09/24/20 0657  WBC 8.7  HGB 9.3*  HCT 29.3*  PLT 175    Recent Labs    09/24/20 0657  NA 139  K 4.7  CL 108  CO2 23  GLUCOSE 134*  BUN 16  CREATININE 1.01*  CALCIUM 9.1     No intake or output data in the 24 hours ending 09/25/20 0851       Physical Exam: Vital Signs Blood pressure 135/64, pulse 65, temperature 99.1 F (37.3 C), temperature source Oral, resp. rate 18, height 5\' 1"  (1.549 m), SpO2 100 %.    General: awake, alert, appropriate, sitting up in bed; finished breakfast; NAD HENT: conjugate gaze; oropharynx moist CV: regular rate; no JVD Pulmonary: CTA B/L; no W/R/R- good air movement GI: soft, NT, ND, (+)BS- normoactive Psychiatric: appropriate; but still flat; interactive however Neurological: alert  Ext: no clubbing, cyanosis, or edema Psych: pleasant and cooperative  Neurological: alert- will not let look at feet Decreased to light touch in hands and below knees B/L - stable     Comments: RUE- 4/5 except FA 4/5 LUE- 4/5 except FA 4-/5 RLE- HF 3-/5, at least 3/5 otherwise- still sensitive to touch/manipulation LLE- HF 3+/5 -otherwise at least 3/5  Skin:    Comments: warm, intact        Assessment/Plan: 1. Functional deficits which require 3+ hours per day of interdisciplinary therapy in a comprehensive inpatient rehab setting. Physiatrist is providing close team supervision and 24 hour management of active medical problems listed below. Physiatrist and rehab team continue to assess barriers to discharge/monitor patient progress toward functional and  medical goals  Care Tool:  Bathing    Body parts bathed by patient: Right arm, Left arm, Chest, Abdomen, Right upper leg, Left upper leg, Front perineal area, Face   Body parts bathed by helper: Buttocks, Right lower leg, Left lower leg Body parts n/a: Buttocks, Left lower leg, Right lower leg   Bathing assist Assist Level: Moderate Assistance - Patient 50 - 74%     Upper Body Dressing/Undressing Upper body dressing   What is the patient wearing?: Pull over shirt    Upper body assist Assist Level: Set up assist    Lower Body Dressing/Undressing Lower body dressing      What is the patient wearing?: Incontinence brief, Pants     Lower body assist Assist for lower body dressing: Moderate Assistance - Patient 50 - 74%     Toileting Toileting    Toileting assist Assist for toileting: Maximal Assistance - Patient 25 - 49% (Bedlevel)     Transfers Chair/bed transfer  Transfers assist     Chair/bed transfer assist level: Minimal Assistance - Patient > 75% Chair/bed transfer assistive device: [place]   Ambulation assist   Ambulation  activity did not occur: Safety/medical concerns (Anxiety, BLE weakness, SCI)  Assist level: Contact Guard/Touching assist Assistive device: Walker-rolling Max distance: 168 ft   Walk 10 feet activity   Assist  Walk 10 feet activity did not occur: Safety/medical concerns (Anxiety, BLE weakness, SCI)  Assist level: Contact Guard/Touching assist Assistive device: Walker-rolling   Walk 50 feet activity   Assist Walk 50 feet with 2 turns activity did not occur: Safety/medical concerns  Assist level: Contact Guard/Touching assist Assistive device: Walker-rolling    Walk 150 feet activity   Assist Walk 150 feet activity did not occur: Safety/medical concerns (Anxiety, BLE weakness, SCI)  Assist level: Contact Guard/Touching assist Assistive device: Walker-rolling    Walk 10 feet on uneven surface   activity   Assist Walk 10 feet on uneven surfaces activity did not occur: Safety/medical concerns (Anxiety, BLE weakness, SCI)         Wheelchair     Assist Is the patient using a wheelchair?: Yes Type of Wheelchair: Manual Wheelchair activity did not occur: Safety/medical concerns (High anxiety, fear-avoidance behavior)  Wheelchair assist level: Minimal Assistance - Patient > 75% Max wheelchair distance: 150'    Wheelchair 50 feet with 2 turns activity    Assist    Wheelchair 50 feet with 2 turns activity did not occur: Safety/medical concerns (High anxiety, fear-avoidance behavior)   Assist Level: Minimal Assistance - Patient > 75%   Wheelchair 150 feet activity     Assist  Wheelchair 150 feet activity did not occur: Safety/medical concerns (High anxiety, fear-avoidance behavior)   Assist Level: Minimal Assistance - Patient > 75%   Blood pressure 135/64, pulse 65, temperature 99.1 F (37.3 C), temperature source Oral, resp. rate 18, height 5\' 1"  (1.549 m), SpO2 100 %.  Medical Problem List and Plan: 1.  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  Antithrombotics: -DVT/anticoagulation:  Mechanical: Sequential compression devices, below knee Bilateral lower extremities since Plts are 66k 8/27- Plts up to 130k- if they stay up on Monday, will try Lovenox- esp with hx of PE. Dopplers (-) on 8/27 8/28- labs qmonday AM 8/30- plts up to 227k - will start Lovenox and monitor closely. Ordered qmonday/Thursday CBC-diff 9/1- Plts 290k- doing much better 9/12- labs pending this AM- con't to monitor  9/13- plts 175k- still in normal range, but headed down some- will recheck Thursday.              -antiplatelet therapy: ASA             --H/o PE last  year- 3. Pain Management: Tylenol prn.  4. Mood: LCSW to follow for evaluation and support.              -antipsychotic agents: N/A 5. Neuropsych: This patient is capable of making decisions on his own behalf. 6. Skin/Wound Care: Routine pressure relief measures.  7. Fluids/Electrolytes/Nutrition: Monitor I/O. Check lytes in am.  8.  T2DM: Hgb A1C-7.8--monitor BS ac/hs and use SSI for elevated Bs             --Continue 70/30 insulin 25 units bid.   8/27- BG's running 200s-300s- will increase 70/30 to 30 units BID  9/1-  BG's 160-213- will increase to 29 units- at 30 units, had very low BG 2 days ago in AM  9/7- doesn't want meal time insulin- BG's 105-205- doing better- con't regimen- had OJ in room, FYI  9/8- will decrease to 29 units BID and add nighttime snack due to some hypoglycemia in last 24 hours.   9/10-11- sugars still labile   -suspect hyperglycemia at end of last week d/t rx of hypoglycemia earlier in day at lunch   -reduced 70/30 insulin to 24u in am, keep 29 in pm   -no AM CBG yet recorded 9/11--monitor today  CBG (last 3)  Recent Labs    09/24/20 1703 09/24/20 2119 09/25/20 0616  GLUCAP 232* 186* 138*    9/13- BG's looking overall adequate- con't regimen 9. Vitamin B12: B12< 50.               --Continue B12 1000 mcg daily X 7 days-->weekly for 7 weeks then monthly.  --lifelong B12 1000 mcg monthly supplement after loading doses.  10. 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 Thrombocytopenia: Platelets with steady drop 98 --> 68.             --was 149-171 range earlier this month and not on any heparin products.              --pancytopenia noted-->drop .   8/30- Plts up to 227k- started lovenox  9/2 plts 290k on lovenox 11. Vitamin B1 deficiency: Thiamine 53.1--continue supplement.  12. Anxiety d/o: Continue Xanax prn.   9/13- did large stairs- doing better- con't regimen 13. HTN/Hypotension: Norvasc/Lisinopril d/c 08/21 due to hypotension and acute on  chronic RF? 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  8/27- having accidents- of bowel and bladder- will d/w pt tomorrow about bowel program, and timed voiding  8/28- will order timed voiding q2 hours while awake.  having bowel incontinence,   9/2- tolerating bowel program- but not documented- let nursing know- and timed voiding helping- PVR 166cc x1  9/7- need husband trained in bowel program  9/8-= per staff, she's voiding on self and unconcerned about it. On timed voiding, but even with staff in room, still voids.   9/9-11- having bm's with PM bowel program until last night--seems to be working for the most part  9/12- will speak with nursing if still needs bowel program- probably for now, but if using toilet, things getting better  9/13- doing bowel program still, but finishing on toilet- which is great- con't regimen 15.  Spasticity-  9/5- will increase baclofen to 10 mg TID. For extensor tone. 9/6- doing better- con't regimen 16. Anxiety/fear of falling- 9/6- will d/w pt tomorrow about meds that could help this-  9/7- will start Paxil 20 mg nightly for anxiety since already on Xanax prn  9/12- is doing better- but did panic trying to go down large step stairs- con't to monitor    LOS: 19 days A FACE TO FACE EVALUATION WAS PERFORMED  Kasidi Shanker 09/25/2020, 8:51 AM

## 2020-09-25 NOTE — Progress Notes (Signed)
Inpatient Diabetes Program Recommendations  AACE/ADA: New Consensus Statement on Inpatient Glycemic Control (2015)  Target Ranges:  Prepandial:   less than 140 mg/dL      Peak postprandial:   less than 180 mg/dL (1-2 hours)      Critically ill patients:  140 - 180 mg/dL   Lab Results  Component Value Date   GLUCAP 160 (H) 09/25/2020   HGBA1C 7.8 (H) 08/31/2020    Review of Glycemic Control Results for Diane Willis, Diane Willis (MRN [id number]) as of 09/25/2020 14:15  Ref. Range 09/24/2020 06:13 09/24/2020 11:36 09/24/2020 17:03 09/24/2020 21:19 09/25/2020 06:16 09/25/2020 11:40 09/25/2020 12:36 09/25/2020 14:08  Glucose-Capillary Latest Ref Range: 70 - 99 mg/dL 469 (H) 629 (H) 528 (H) 186 (H) 138 (H) 43 (LL) 102 (H) 160 (H)   Diabetes history: DM2 Outpatient Diabetes medications: Novolog 70/30 30 units ac breakfast, 25 units ac supper, Januvia 100 mg qd, Metformin 1 gm bid Current orders for Inpatient glycemic control: Novolog 70/30 24 units am + 29 units pm, Novolog 0-9 units tid + 0-5 units hs  Inpatient Diabetes Program Recommendations:   -Noted 70/30 24 units given @ 0638 am and hypoglycemia @ 11:40. Secure message sent to RN Lelon Mast [place]-states she thinks patient ate earlier than documented close to time insulin was given. -Consider decrease in am 70/30 to 22 units. Secure chat sent to Dr. Berline Chough.  Thank you, Billy Fischer. Elgie Landino, RN, MSN, CDE  Diabetes Coordinator Inpatient Glycemic Control Team Team Pager [phone] (8am-5pm) 09/25/2020 2:45 PM

## 2020-09-25 NOTE — Progress Notes (Signed)
Occupational Therapy Session Note  Patient Details  Name: Diane Willis MRN: [id number] Date of Birth: December 09, 1955  Today's Date: 09/25/2020 OT Individual Time: 1300-1345 OT Individual Time Calculation (min): 45 min    Short Term Goals: Week 3:  OT Short Term Goal 1 (Week 3): Pt will perform sit<>stand with mod A using RW in preparation for pulling pants over hips OT Short Term Goal 2 (Week 3): Pt will complete LB bathing with min A seated EOB using lateral leans OT Short Term Goal 3 (Week 3): Pt will complete toileting with mod A  Skilled Therapeutic Interventions/Progress Updates:    Patient seated in recliner, husband present for session.  She denies pain and states that she wants to work on leg strength so that she can climb stairs more easily.  Sit to stand from recliner and ambulation with RW to w/c with CGA.  Completed stepping activity on kinetron 3 x 1 min.   Reviewed and practiced basic reach and transport of items with RW in kitchen environment.  She was able to navigate to and reach for 3 objects with moderate cues for hand placement and technique.  Discussed energy conservation and options to improve safety.  Husband demonstrates good understanding, patient would benefit from ongoing practice.  Completed standing stepping/leg coordination activities with mod A and increased posterior and left lean.  Completed seated unsupported cross body reach with moderate anxiety and noted increased extensor tone in legs.  Moved to knee to opposite elbow activity with improved posture and leg position upon completion.   She remained seated in w/c awaiting upcoming PT session.    Therapy Documentation Precautions:  Precautions Precautions: Fall Precaution Comments: ataxic, sensation of falling with movement, anxious Restrictions Weight Bearing Restrictions: No  Therapy/Group: Individual Therapy  Barrie Lyme 09/25/2020, 7:46 AM

## 2020-09-25 NOTE — Progress Notes (Signed)
Bowel program started. Dig stim performed. Suppository inserted. Patient refused dig stim after the suppository was given. Patient did have small bowel movement. Tolerated well.

## 2020-09-26 DIAGNOSIS — F418 Other specified anxiety disorders: Secondary | ICD-10-CM

## 2020-09-26 DIAGNOSIS — R4589 Other symptoms and signs involving emotional state: Secondary | ICD-10-CM

## 2020-09-26 DIAGNOSIS — R0989 Other specified symptoms and signs involving the circulatory and respiratory systems: Secondary | ICD-10-CM

## 2020-09-26 DIAGNOSIS — R7309 Other abnormal glucose: Secondary | ICD-10-CM

## 2020-09-26 LAB — GLUCOSE, CAPILLARY
Glucose-Capillary: 28 mg/dL — CL (ref 70–99)
Glucose-Capillary: 55 mg/dL — ABNORMAL LOW (ref 70–99)
Glucose-Capillary: 143 mg/dL — ABNORMAL HIGH (ref 70–99)
Glucose-Capillary: 266 mg/dL — ABNORMAL HIGH (ref 70–99)
Glucose-Capillary: 249 mg/dL — ABNORMAL HIGH (ref 70–99)
Glucose-Capillary: 47 mg/dL — ABNORMAL LOW (ref 70–99)
Glucose-Capillary: 52 mg/dL — ABNORMAL LOW (ref 70–99)
Glucose-Capillary: 87 mg/dL (ref 70–99)
Glucose-Capillary: 279 mg/dL — ABNORMAL HIGH (ref 70–99)
Glucose-Capillary: 206 mg/dL — ABNORMAL HIGH (ref 70–99)

## 2020-09-26 MED ORDER — GLUCOSE 40 % PO GEL
ORAL | Status: AC
  Administered 2020-09-26: 37.5 g
  Filled 2020-09-26: qty 1.21

## 2020-09-26 MED ORDER — INSULIN ASPART PROT & ASPART (70-30 MIX) 100 UNIT/ML ~~LOC~~ SUSP
20.0000 [IU] | Freq: Every day | SUBCUTANEOUS | Status: DC
  Administered 2020-09-27 – 2020-09-28 (×2): 20 [IU] via SUBCUTANEOUS
  Filled 2020-09-26: qty 10

## 2020-09-26 MED ORDER — INSULIN ASPART PROT & ASPART (70-30 MIX) 100 UNIT/ML ~~LOC~~ SUSP: 20.0000 [IU] | Freq: Every day | SUBCUTANEOUS | Status: DC

## 2020-09-26 MED ORDER — INSULIN ASPART PROT & ASPART (70-30 MIX) 100 UNIT/ML ~~LOC~~ SUSP
10.0000 [IU] | Freq: Every day | SUBCUTANEOUS | Status: DC
  Administered 2020-09-26: 10 [IU] via SUBCUTANEOUS
  Filled 2020-09-26: qty 10

## 2020-09-26 NOTE — Progress Notes (Signed)
Patient ID: Diane Willis, female   DOB: [birth year], 65 y.o.   MRN: [id number]  SW spoke with Anna/Intake with [facility] [phone]) to discuss potential referral. Reports unable to accept due to staffing. SW will continue to explore other options.   Cecile Sheerer, MSW, LCSWA Office: [phone] Cell: [phone] Fax: [phone]

## 2020-09-26 NOTE — Discharge Instructions (Addendum)
Inpatient Rehab Discharge Instructions  Diane Willis Discharge date and time: 10/03/20   Activities/Precautions/ Functional Status: Activity: no lifting, driving, or strenuous exercise  till cleared by MD Diet: diabetic diet Low fat low salt Wound Care: none needed   Functional status:  ___ No restrictions     ___ Walk up steps independently _X__ 24/7 supervision/assistance   ___ Walk up steps with assistance ___ Intermittent supervision/assistance  ___ Bathe/dress independently ___ Walk with walker     _X__ Bathe/dress with assistance ___ Walk Independently    ___ Shower independently ___ Walk with assistance    ___ Shower with assistance _X__ No alcohol     ___ Return to work/school ________   Special Instructions:  Need to be on blood thinner thorough Oct 25th--extra #4 lovenox injections have been called in to [facility] for pick up a couple of days before you run out of this med.  Vitamin B 12 injections to continue till  COMMUNITY REFERRALS UPON DISCHARGE:    Home Health:   PT     OT     RN                Agency:[facility]  Phone:[phone] *Please expect follow-up within 2-3 days of discharge to schedule your home visit. If you have not received follow-up, be sure to make contact with branch directly.*     Medical Equipment/Items Ordered: youth rolling walker, 3in1 bedside commode                                                 Agency/Supplier: [phone]   My questions have been answered and I understand these instructions. I will adhere to these goals and the provided educational materials after my discharge from the hospital.  Patient/Caregiver Signature _______________________________ Date __________  Clinician Signature _______________________________________ Date __________  Please bring this form and your medication list with you to all your follow-up doctor's appointments.

## 2020-09-26 NOTE — Progress Notes (Signed)
Occupational Therapy Session Note  Patient Details  Name: Diane Willis MRN: [id number] Date of Birth: [birth year]  Today's Date: 09/26/2020 OT Individual Time: 9169-4503 OT Individual Time Calculation (min): 70 min    Short Term Goals: Week 3:  OT Short Term Goal 1 (Week 3): Pt will perform sit<>stand with mod A using RW in preparation for pulling pants over hips OT Short Term Goal 2 (Week 3): Pt will complete LB bathing with min A seated EOB using lateral leans OT Short Term Goal 3 (Week 3): Pt will complete toileting with mod A  Skilled Therapeutic Interventions/Progress Updates:    Pt resting in bed upon arrival and ready for getting OOB to use toilet and wash up before donning paper scrubs. Pt declined use of RW for transfer to w/c and requested use of Stedy for all standing this morning. Sit<>stand in [place] with supervision. Toileting with use of Stedy with supervision. Pt completed bathing/dressing tasks in [place] and seated in w/c. Pt able to thread BLE into pants without assistance. Pt required assistance pulling pants over hips while standing in [place]. Pt use Stedy for transfer to recliner. Pt remained in recliner with all needs within reach and belt alarm activated.   Therapy Documentation Precautions:  Precautions Precautions: Fall Precaution Comments: ataxic, sensation of falling with movement, anxious Restrictions Weight Bearing Restrictions: No   Pain:  Pt denies pain this morning   Therapy/Group: Individual Therapy  Rich Brave 09/26/2020, 9:32 AM

## 2020-09-26 NOTE — Progress Notes (Signed)
Patient ID: Diane Willis, female   DOB: 01-May-1955, 65 y.o.   MRN: [id number]  SW sent HHPT/OT referral to [facility] and waiting on [facility].  *referral accepted by branch.  Cecile Sheerer, MSW, LCSWA Office: [phone] Cell: [phone] Fax: [phone]

## 2020-09-26 NOTE — Progress Notes (Signed)
Occupational Therapy Note  Patient Details  Name: Diane Willis MRN: [id number] Date of Birth: Oct 23, 1955  Today's Date: 09/26/2020 OT Missed Time: 60 Minutes Missed Time Reason: Unavailable (comment);Other (comment) (pt declined group session, RT reporting pt with blood sugar issues)  Pt unable to attend group d/t hypoglycemic event, will f/u as time allows to make up missed minutes.    Pollyann Glen [facility] 09/26/2020, 3:44 PM

## 2020-09-26 NOTE — Progress Notes (Signed)
Hypoglycemic Event  CBG: 47  Treatment: 8 oz juice/soda  Symptoms: None  Follow-up CBG: Time: 1244 CBG Result:52 At 1244 patient was finished lunch. Ate 100%. 8oz juice and pack of crackers was given at this time. CBG result 87 rechecked at 1316  Possible Reasons for Event: Unknown  Comments/MD notified: Deatra Ina, PA notified. Patient eating lunch. 70/30 insulin decreased for dinner and breakfast tomorrow.    Diane Willis

## 2020-09-26 NOTE — Progress Notes (Signed)
Bowel program started. Dig stim performed. Suppository inserted. Patient refused dig stim after the suppository was given. Patient will call when she is ready to try for BM. Report given to night shift.

## 2020-09-26 NOTE — Progress Notes (Signed)
Physical Therapy Session Note  Patient Details  Name: Diane Willis MRN: [id number] Date of Birth: Dec 17, 1955  Today's Date: 09/26/2020 PT Individual Time: 1109-1212 PT Individual Time Calculation (min): 63 min   Short Term Goals: Week 3:  PT Short Term Goal 1 (Week 3): Pt will ambulate at least 58f using LRAD with min assist PT Short Term Goal 2 (Week 3): Pt will ascend/descend 4 (6" steps) with max assist using HRs PT Short Term Goal 3 (Week 3): Pt will perform simulated car transfer using LRAD with mod assist  Skilled Therapeutic Interventions/Progress Updates: Pt presented in recliner agreeable to therapy. Pt denies pain and PTA discussed with pt yesterday's events in relation to increased tone that occurred at end of second session. Discussed the possible need for energy conservation as that may have triggered tone as well as any increase in anxiety. Pt verbalized understanding and mentioned may have felt more tightness in leg as well. Pt then agreeable to perform ambulatory transfer to w/c. Performed STS from recliner with CGA and ambulated ~531fto w/c with CGA. Pt then transported to day room and participated in Cybex Kinetron 60-50c/sec x 5 min for endurance and general conditioning. Pt's husband arrived and pt agreeable to attempt stairs again. Pt transported to rehab gym and discussed ascending/descending stairs x 1 rail (R). Pt was able to ascend x 4 steps with CGA and increased time but once pt reach top of step noted increased extensor tone and anxiety. With time pt was able to improve erect posture and tone absent. When pt attempted to descend stairs tone immediately present with pt limited in following commands to bring hips forward or move arms forward down rail. Second therapist present for safety with pt primarily sitting on second therapist's leg during descent. Once at bottom of stairs pt tone again notably gone and able to perform ambulatory transfer to recliner. Discussed  extensively with pt and husband anxiety triggering tone and brainstormed methods to build up confidence in stairs. Pt agreeable to perform sidestepping x 2 then x 4 steps on 3in steps which pt was able to perform repeatedly with CGA and intermittent verbal cues for foot placement. Pt felt more comfortable with this method and discussed trying again tomorrow via alternate method. Pt transported back to room and performed ambulatory transfer to recliner similar to beginning of session. Pt left in recliner at end of session with belt alarm on, call bell within reach and needs met.      Therapy Documentation Precautions:  Precautions Precautions: Fall Precaution Comments: ataxic, sensation of falling with movement, anxious Restrictions Weight Bearing Restrictions: No General: PT Amount of Missed Time (min): 12 Minutes PT Missed Treatment Reason: Other (Comment) (PTA error)  Therapy/Group: Individual Therapy  Asante Ritacco 09/26/2020, 1:11 PM

## 2020-09-26 NOTE — Progress Notes (Signed)
PROGRESS NOTE   Subjective/Complaints: Patient seen sitting up in her chair this AM.  She states she did not sleep well overnight due to hypoglycemia.  Discussed bedside snack.  Discussed CBGs with therapies as well.  ROS: Denies CP, SOB, N/V/D  Objective:   No results found. Recent Labs    09/24/20 0657  WBC 8.7  HGB 9.3*  HCT 29.3*  PLT 175     Recent Labs    09/24/20 0657  NA 139  K 4.7  CL 108  CO2 23  GLUCOSE 134*  BUN 16  CREATININE 1.01*  CALCIUM 9.1      Intake/Output Summary (Last 24 hours) at 09/26/2020 1104 Last data filed at 09/26/2020 4097 Gross per 24 hour  Intake 838 ml  Output --  Net 838 ml         Physical Exam: Vital Signs Blood pressure (!) 153/80, pulse 78, temperature 98 F (36.7 C), temperature source Oral, resp. rate 18, height 5\' 1"  (1.549 m), weight 81.9 kg, SpO2 100 %. Constitutional: No distress . Vital signs reviewed. HENT: Normocephalic.  Atraumatic. Eyes: EOMI. No discharge. Cardiovascular: No JVD.  RRR. Respiratory: Normal effort.  No stridor.  Bilateral clear to auscultation. GI: Non-distended.  BS +. Skin: Warm and dry.  Intact. Psych: Normal mood.  Normal behavior. Musc: No edema in extremities.  No tenderness in extremities. Neurological: Alert and oriented Sensation decreased to light touch in hands and below knees B/L  Motor: : RUE- 4/5 proximal to distal LUE- 4/5 proximal to distal RLE- HF 4-/5 LLE- HF 4-/5     Assessment/Plan: 1. Functional deficits which require 3+ hours per day of interdisciplinary therapy in a comprehensive inpatient rehab setting. Physiatrist is providing close team supervision and 24 hour management of active medical problems listed below. Physiatrist and rehab team continue to assess barriers to discharge/monitor patient progress toward functional and medical goals  Care Tool:  Bathing    Body parts bathed by patient: Right  arm, Left arm, Chest, Abdomen, Front perineal area, Buttocks, Right upper leg, Left upper leg, Face   Body parts bathed by helper: Right lower leg, Left lower leg Body parts n/a: Buttocks, Left lower leg, Right lower leg   Bathing assist Assist Level: Minimal Assistance - Patient > 75%     Upper Body Dressing/Undressing Upper body dressing   What is the patient wearing?: Pull over shirt    Upper body assist Assist Level: Set up assist    Lower Body Dressing/Undressing Lower body dressing      What is the patient wearing?: Incontinence brief, Pants     Lower body assist Assist for lower body dressing: Moderate Assistance - Patient 50 - 74%     Toileting Toileting    Toileting assist Assist for toileting: Minimal Assistance - Patient > 75%     Transfers Chair/bed transfer  Transfers assist     Chair/bed transfer assist level: Minimal Assistance - Patient > 75% Chair/bed transfer assistive device:   Ambulation assist   Ambulation activity did not occur: Safety/medical concerns (Anxiety, BLE weakness, SCI)  Assist level: Contact Guard/Touching assist Assistive device: Walker-rolling Max distance:  168 ft   Walk 10 feet activity   Assist  Walk 10 feet activity did not occur: Safety/medical concerns (Anxiety, BLE weakness, SCI)  Assist level: Contact Guard/Touching assist Assistive device: Walker-rolling   Walk 50 feet activity   Assist Walk 50 feet with 2 turns activity did not occur: Safety/medical concerns  Assist level: Contact Guard/Touching assist Assistive device: Walker-rolling    Walk 150 feet activity   Assist Walk 150 feet activity did not occur: Safety/medical concerns (Anxiety, BLE weakness, SCI)  Assist level: Contact Guard/Touching assist Assistive device: Walker-rolling    Walk 10 feet on uneven surface  activity   Assist Walk 10 feet on uneven surfaces activity did not occur: Safety/medical  concerns (Anxiety, BLE weakness, SCI)         Wheelchair     Assist Is the patient using a wheelchair?: Yes Type of Wheelchair: Manual Wheelchair activity did not occur: Safety/medical concerns (High anxiety, fear-avoidance behavior)  Wheelchair assist level: Minimal Assistance - Patient > 75% Max wheelchair distance: 150'    Wheelchair 50 feet with 2 turns activity    Assist    Wheelchair 50 feet with 2 turns activity did not occur: Safety/medical concerns (High anxiety, fear-avoidance behavior)   Assist Level: Minimal Assistance - Patient > 75%   Wheelchair 150 feet activity     Assist  Wheelchair 150 feet activity did not occur: Safety/medical concerns (High anxiety, fear-avoidance behavior)   Assist Level: Minimal Assistance - Patient > 75%    Medical Problem List and Plan: 1.  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  Antithrombotics: -DVT/anticoagulation:  Mechanical: Sequential compression devices, below knee Bilateral lower extremities  Dopplers (-) on 8/27 Lovenox              -antiplatelet therapy: ASA             --H/o PE last year 3. Pain Management: Tylenol prn.  4. Mood: LCSW to follow for evaluation and support.              -antipsychotic agents: N/A 5. Neuropsych: This patient is capable of making decisions on his own behalf. 6. Skin/Wound Care: Routine pressure relief measures.  7. Fluids/Electrolytes/Nutrition: Monitor I/Os 8.  T2DM: Hgb A1C-7.8--monitor BS ac/hs and use SSI for elevated Bs             Insulin 70/30 decreased to 20 daily on 9/15  Insulin 70/30 decreased to 10 nightly on 9/14   CBG (last 3)  Recent Labs    09/26/20 0332 09/26/20 0613 09/26/20 0729  GLUCAP 143* 266* 249*    Labile with hypoglycemia on 9/14 9. Vitamin B12: B12< 50.               --Continue B12 1000 mcg daily X 7 days-->weekly for 7 weeks then monthly.  --lifelong B12 1000  mcg monthly supplement after loading doses.  10. 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 Thrombocytopenia: Resolved 11. Vitamin B1 deficiency: Thiamine 53.1--continue supplement.  12. Anxiety d/o: Continue Xanax prn.  13. 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  Slightly labile on 9/14 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  Continue bowel program 15.  Spasticity-  Increased baclofen to 10 mg TID. For extensor tone.  On 9/5 16. Anxiety/fear of falling- Continue Paxil 20 mg nightly for anxiety since already on Xanax prn   LOS: 20 days A FACE  TO FACE EVALUATION WAS PERFORMED  Diane Willis [place] 09/26/2020, 11:04 AM

## 2020-09-26 NOTE — Progress Notes (Signed)
Hypoglycemic Event  CBG: 34  Treatment: 8 oz juice/soda and 1 tube glucose gel  Symptoms: Sweaty and Shaky  Follow-up CBG: Time:0315 CBG Result:55  Pt given an addition 4oz of juice and ate 2 graham crakers, CBG at 0330 143   Possible Reasons for Event: unkwown, patient had a bedtime snack of an [place] ice pop, no additional protein or consistent carbs  Comments/MD notified:per charge nurse MD to be notified when he arrives on unit    [place]

## 2020-09-27 DIAGNOSIS — E538 Deficiency of other specified B group vitamins: Secondary | ICD-10-CM

## 2020-09-27 DIAGNOSIS — E1165 Type 2 diabetes mellitus with hyperglycemia: Secondary | ICD-10-CM

## 2020-09-27 DIAGNOSIS — D539 Nutritional anemia, unspecified: Secondary | ICD-10-CM

## 2020-09-27 LAB — CBC WITH DIFFERENTIAL/PLATELET
Abs Immature Granulocytes: 0.01 10*3/uL (ref 0.00–0.07)
Basophils Absolute: 0 10*3/uL (ref 0.0–0.1)
Basophils Relative: 1 %
Eosinophils Absolute: 0.3 10*3/uL (ref 0.0–0.5)
Eosinophils Relative: 4 %
HCT: 27.3 % — ABNORMAL LOW (ref 36.0–46.0)
Hemoglobin: 8.4 g/dL — ABNORMAL LOW (ref 12.0–15.0)
Immature Granulocytes: 0 %
Lymphocytes Relative: 26 %
Lymphs Abs: 1.6 10*3/uL (ref 0.7–4.0)
MCH: 32.8 pg (ref 26.0–34.0)
MCHC: 30.8 g/dL (ref 30.0–36.0)
MCV: 106.6 fL — ABNORMAL HIGH (ref 80.0–100.0)
Monocytes Absolute: 0.5 10*3/uL (ref 0.1–1.0)
Monocytes Relative: 9 %
Neutro Abs: 3.8 10*3/uL (ref 1.7–7.7)
Neutrophils Relative %: 60 %
Platelets: 152 10*3/uL (ref 150–400)
RBC: 2.56 MIL/uL — ABNORMAL LOW (ref 3.87–5.11)
RDW: 13.7 % (ref 11.5–15.5)
WBC: 6.2 10*3/uL (ref 4.0–10.5)
nRBC: 0 % (ref 0.0–0.2)

## 2020-09-27 LAB — GLUCOSE, CAPILLARY
Glucose-Capillary: 161 mg/dL — ABNORMAL HIGH (ref 70–99)
Glucose-Capillary: 263 mg/dL — ABNORMAL HIGH (ref 70–99)
Glucose-Capillary: 329 mg/dL — ABNORMAL HIGH (ref 70–99)
Glucose-Capillary: 65 mg/dL — ABNORMAL LOW (ref 70–99)
Glucose-Capillary: 74 mg/dL (ref 70–99)
Glucose-Capillary: 251 mg/dL — ABNORMAL HIGH (ref 70–99)
Glucose-Capillary: 150 mg/dL — ABNORMAL HIGH (ref 70–99)
Glucose-Capillary: 145 mg/dL — ABNORMAL HIGH (ref 70–99)

## 2020-09-27 MED ORDER — INSULIN ASPART PROT & ASPART (70-30 MIX) 100 UNIT/ML ~~LOC~~ SUSP
15.0000 [IU] | Freq: Every day | SUBCUTANEOUS | Status: DC
  Administered 2020-09-27 – 2020-10-02 (×6): 15 [IU] via SUBCUTANEOUS
  Filled 2020-09-27: qty 10

## 2020-09-27 MED ORDER — GLUCOSE 40 % PO GEL
ORAL | Status: AC
  Filled 2020-09-27: qty 1.21

## 2020-09-27 NOTE — Progress Notes (Signed)
[facility] snack delivered to patient

## 2020-09-27 NOTE — Progress Notes (Signed)
Occupational Therapy Session Note  Patient Details  Name: Diane Willis MRN: [id number] Date of Birth: 12-24-1955  Today's Date: 09/27/2020 OT Individual Time: 1300-1330 OT Individual Time Calculation (min): 30 min    Short Term Goals: Week 3:  OT Short Term Goal 1 (Week 3): Pt will perform sit<>stand with mod A using RW in preparation for pulling pants over hips OT Short Term Goal 2 (Week 3): Pt will complete LB bathing with min A seated EOB using lateral leans OT Short Term Goal 3 (Week 3): Pt will complete toileting with mod A  Skilled Therapeutic Interventions/Progress Updates:    Cotx with recreational therapist. OT intervention with focus on sitting balance while engaged in reaching for objects outside BOS. Pt with slight lateral lean to Lt when sitting. Reaching task to pt's Rt. Pt with increased hesitancy and anxiousness when reaching. BLE noted with extensor tone simultaneously with slight posterior lean at hips. Pt requires max A to maintain BLE on floor when engaged in any dynamic sitting task. Sit<>stand with CGA and amb short distance to w/c and recliner in room with min A/CGA. Pt reports some anxiety with any dynamic movement. Of note, pt always grasps arm rests on w/c when sitting for transport. Pt reamined in relciner with all needs within reach and belt alarm activated.   Therapy Documentation Precautions:  Precautions Precautions: Fall Precaution Comments: ataxic, sensation of falling with movement, anxious Restrictions Weight Bearing Restrictions: No   Pain: Pain Assessment Pain Score: 0-No pain   Therapy/Group: Individual Therapy  Rich Brave 09/27/2020, 2:30 PM

## 2020-09-27 NOTE — Plan of Care (Signed)
  Problem: SCI BOWEL ELIMINATION Goal: RH STG MANAGE BOWEL WITH ASSISTANCE Description: STG Manage Bowel with min Assistance. Outcome: Not Progressing; bowel program   Problem: SCI BLADDER ELIMINATION Goal: RH STG MANAGE BLADDER WITH ASSISTANCE Description: STG Manage Bladder With min Assistance Outcome: Not Progressing; incontinence at times

## 2020-09-27 NOTE — Progress Notes (Signed)
PROGRESS NOTE   Subjective/Complaints: Patient seen sitting up in her chair this morning.  She states she slept well overnight.  Working with therapies.  She denies hypoglycemic episodes yesterday.  ROS: Denies CP, SOB, N/V/D  Objective:   No results found. Recent Labs    09/27/20 0517  WBC 6.2  HGB 8.4*  HCT 27.3*  PLT 152     No results for input(s): NA, K, CL, CO2, GLUCOSE, BUN, CREATININE, CALCIUM in the last 72 hours.    Intake/Output Summary (Last 24 hours) at 09/27/2020 1027 Last data filed at 09/27/2020 0747 Gross per 24 hour  Intake 960 ml  Output --  Net 960 ml          Physical Exam: Vital Signs Blood pressure (!) 135/54, pulse 81, temperature 99.1 F (37.3 C), temperature source Oral, resp. rate 18, height 5\' 1"  (1.549 m), weight 81.9 kg, SpO2 98 %. Constitutional: No distress . Vital signs reviewed. HENT: Normocephalic.  Atraumatic. Eyes: EOMI. No discharge. Cardiovascular: No JVD.  RRR. Respiratory: Normal effort.  No stridor.  Bilateral clear to auscultation. GI: Non-distended.  BS +. Skin: Warm and dry.  Intact. Psych: Normal mood.  Normal behavior. Musc: No edema in extremities.  No tenderness in extremities. Neurological: Alert and oriented Sensation decreased to light touch in hands and below knees B/L, stable Motor: : RUE- 4/5 proximal to distal LUE- 4/5 proximal to distal RLE- HF 4-/5 LLE- HF 4-/5     Assessment/Plan: 1. Functional deficits which require 3+ hours per day of interdisciplinary therapy in a comprehensive inpatient rehab setting. Physiatrist is providing close team supervision and 24 hour management of active medical problems listed below. Physiatrist and rehab team continue to assess barriers to discharge/monitor patient progress toward functional and medical goals  Care Tool:  Bathing    Body parts bathed by patient: Right arm, Left arm, Chest, Abdomen, Front  perineal area, Buttocks, Right upper leg, Left upper leg, Face   Body parts bathed by helper: Right lower leg, Left lower leg Body parts n/a: Buttocks, Left lower leg, Right lower leg   Bathing assist Assist Level: Minimal Assistance - Patient > 75%     Upper Body Dressing/Undressing Upper body dressing   What is the patient wearing?: Pull over shirt    Upper body assist Assist Level: Set up assist    Lower Body Dressing/Undressing Lower body dressing      What is the patient wearing?: Incontinence brief, Pants     Lower body assist Assist for lower body dressing: Moderate Assistance - Patient 50 - 74%     Toileting Toileting    Toileting assist Assist for toileting: Minimal Assistance - Patient > 75%     Transfers Chair/bed transfer  Transfers assist     Chair/bed transfer assist level: Minimal Assistance - Patient > 75% Chair/bed transfer assistive device:   Ambulation assist   Ambulation activity did not occur: Safety/medical concerns (Anxiety, BLE weakness, SCI)  Assist level: Contact Guard/Touching assist Assistive device: Walker-rolling Max distance: 168 ft   Walk 10 feet activity   Assist  Walk 10 feet activity did not occur: Safety/medical concerns (  Anxiety, BLE weakness, SCI)  Assist level: Contact Guard/Touching assist Assistive device: Walker-rolling   Walk 50 feet activity   Assist Walk 50 feet with 2 turns activity did not occur: Safety/medical concerns  Assist level: Contact Guard/Touching assist Assistive device: Walker-rolling    Walk 150 feet activity   Assist Walk 150 feet activity did not occur: Safety/medical concerns (Anxiety, BLE weakness, SCI)  Assist level: Contact Guard/Touching assist Assistive device: Walker-rolling    Walk 10 feet on uneven surface  activity   Assist Walk 10 feet on uneven surfaces activity did not occur: Safety/medical concerns (Anxiety, BLE weakness,  SCI)         Wheelchair     Assist Is the patient using a wheelchair?: Yes Type of Wheelchair: Manual Wheelchair activity did not occur: Safety/medical concerns (High anxiety, fear-avoidance behavior)  Wheelchair assist level: Minimal Assistance - Patient > 75% Max wheelchair distance: 150'    Wheelchair 50 feet with 2 turns activity    Assist    Wheelchair 50 feet with 2 turns activity did not occur: Safety/medical concerns (High anxiety, fear-avoidance behavior)   Assist Level: Minimal Assistance - Patient > 75%   Wheelchair 150 feet activity     Assist  Wheelchair 150 feet activity did not occur: Safety/medical concerns (High anxiety, fear-avoidance behavior)   Assist Level: Minimal Assistance - Patient > 75%    Medical Problem List and Plan: 1.  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  Antithrombotics: -DVT/anticoagulation:  Mechanical: Sequential compression devices, below knee Bilateral lower extremities  Dopplers (-) on 8/27 Lovenox              -antiplatelet therapy: ASA             --H/o PE last year 3. Pain Management: Tylenol prn.  4. Mood: LCSW to follow for evaluation and support.              -antipsychotic agents: N/A 5. Neuropsych: This patient is capable of making decisions on his own behalf. 6. Skin/Wound Care: Routine pressure relief measures.  7. Fluids/Electrolytes/Nutrition: Monitor I/Os 8.  T2DM with hyperglycemia: Hgb A1C-7.8--monitor BS ac/hs and use SSI for elevated Bs             Insulin 70/30 decreased to 20 daily on 9/15  Insulin 70/30 decreased to 10 nightly on 9/14, increased to 15 on 9/15   CBG (last 3)  Recent Labs    09/27/20 0103 09/27/20 0410 09/27/20 0558  GLUCAP 161* 263* 329*    Labile with elevation on 9/15 9. Vitamin B12: B12< 50.               --Continue B12 1000 mcg daily X 7 days-->weekly for 7 weeks then monthly.   --lifelong B12 1000 mcg monthly supplement after loading doses.  10. 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 Thrombocytopenia: Resolved 11. Vitamin B1 deficiency: Thiamine 53.1--continue supplement.  12. Anxiety d/o: Continue Xanax prn.  13. 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  Relatively controlled on 9/15 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  Continue bowel program 15.  Spasticity-  Increased baclofen to 10 mg TID. For extensor tone.    Stable on 9/15 16. Anxiety/fear of falling- Continue Paxil 20 mg nightly for anxiety since already on Xanax prn  17.  Macrocytic anemia  Severely low vitamin B12, continue supplementation Hemoglobin 8.4 on 9/15  LOS: 21 days A FACE TO FACE EVALUATION WAS PERFORMED  Diane Willis Karis Juba 09/27/2020, 10:27 AM

## 2020-09-27 NOTE — Progress Notes (Signed)
Recreational Therapy Session Note  Patient Details  Name: Diane Willis MRN: [id number] Date of Birth: 1955/05/17 Today's Date: 09/27/2020  Pain: no c/o Skilled Therapeutic Interventions/Progress Updates: Session focused on activity tolerance, stand pivot transfers, dynamic sitting balance, body awareness during cotreat with OT.  Pt performed stand pivot transfers with RW with Min assist.  Pt transferred onto therapy mat for horseshoe activity emphasizing WBing through BLEs and bending at the hips reaching forward.  Pt exhibited extensor tone in BLE and posterior lean with activity.  Pt requires max assist to keep BLEs on the floor during activity and min cues for encouragement.  Pt expressed anxiety and fear of falling with activity when questioned.    Therapy/Group: Co-Treatment Lovena Kluck 09/27/2020, 3:22 PM

## 2020-09-27 NOTE — Progress Notes (Signed)
Patient ID: Diane Willis, female   DOB: 02/21/1955, 65 y.o.   MRN: [id number]  Met with patient in room, introduced myself and explained my role in her care. We discussed her current medications and her diabetes. I gave her additional educational handouts on Living With Diabetes, Diabetes Mellitus Basics, Type 2 Diabetes Mellitus Diagnosis Adult, Diabetes Mellitus and Nutrition Adult, and Diabetes Mellitus Action Plan. She had questions about certain foods that she could eat as a diabetic and we went over each food group. She has been managing her insulin at home. She verbalized understanding with the information given. I will continue to monitor patient.  Dorthula Nettles, RN3, BSN, CBIS, [place] [facility], [facility], Inpatient Rehabilitation Office [phone] Cell [phone]

## 2020-09-27 NOTE — Progress Notes (Signed)
Patient refused additional digital stimulation. Pt requested just to be taking to the restroom. Pt had a large bowel movement.

## 2020-09-27 NOTE — Progress Notes (Signed)
Physical Therapy Session Note  Patient Details  Name: Diane Willis MRN: [id number] Date of Birth: 08-23-1955  Today's Date: 09/27/2020 PT Individual Time: 0805-0909 PT Individual Time Calculation (min): 64 min   Short Term Goals: Week 3:  PT Short Term Goal 1 (Week 3): Pt will ambulate at least 65ft using LRAD with min assist PT Short Term Goal 2 (Week 3): Pt will ascend/descend 4 (6" steps) with max assist using HRs PT Short Term Goal 3 (Week 3): Pt will perform simulated car transfer using LRAD with mod assist  Skilled Therapeutic Interventions/Progress Updates: Pt presented in bed with nsg present agreeable to therapy. Pt denies pain and requests set up scrubs to change into. Pt provided with new scrubs and pt changed into scrubs at bed level using reacher for pants. Pt then performed supine to sit at EOB with supervision and use of bed rails. Pt requesting Stedy for STS to pull up pants. Provided Stedy and pt performed STS with supervision however noted leaning against bed to pull up pants. PTA cues to increase forward wt shift to allow easier ability to pull up pants. Pt then transferred to w/c via Stedy. Pt transported to rehab gym for time management. Participated in ambulation for endurance 22ft with CGA. Pt initially with decreased cadence, poor foot clearance, and significantly decreased gait speed. Pt noted to improve fluidity and foot clearance with increased distance. Pt then transported over to stairs and performed ascending/descending 3in steps x 4 then x8. Pt performed with R rail only and required verbal cues to "bring hips to rail" however was able to perform with CGA. For 8 steps PTA placed 3lb cuffs on BLE for improved coordination of foot placement. Pt then agreeable to try 6in steps with +2 present for safety. Pt ascending using 2 rails then turned to R and was able to ascending x 4 steps with R rail and CGA!. Pt was able to complete this activity x2 with consistent results. Pt  then transported back to room and performed ambulatory transfer to recliner with CGA. Once in recliner pt states feels urgency to use bathroom. PTA used Stedy to transfer to toilet and called NT to hand off. Pt left on toilet with Jess, NT present.      Therapy Documentation Precautions:  Precautions Precautions: Fall Precaution Comments: ataxic, sensation of falling with movement, anxious Restrictions Weight Bearing Restrictions: No General:   Vital Signs: Therapy Vitals Temp: 99.1 F (37.3 C) Temp Source: Oral Pulse Rate: 71 Resp: 18 BP: 130/66 Patient Position (if appropriate): Sitting Oxygen Therapy SpO2: 100 % O2 Device: Room Air Pain:   Mobility:   Locomotion :    Trunk/Postural Assessment :    Balance:   Exercises:   Other Treatments:      Therapy/Group: Individual Therapy  [name] 09/27/2020, 11:52 AM

## 2020-09-27 NOTE — Progress Notes (Signed)
Large bowel movement after transfer to Toilet Bowel program completed

## 2020-09-27 NOTE — Progress Notes (Deleted)
Large bowel movement after transfer to toilet. Bowel program complete

## 2020-09-27 NOTE — Progress Notes (Signed)
Physical Therapy Session Note  Patient Details  Name: Diane Willis MRN: [id number] Date of Birth: Dec 29, 1955  Today's Date: 09/27/2020 PT Individual Time: 1445-1530 PT Individual Time Calculation (min): 45 min   Short Term Goals: Week 1:  PT Short Term Goal 1 (Week 1): Pt will perform sit <>supine w/min A PT Short Term Goal 1 - Progress (Week 1): Met PT Short Term Goal 2 (Week 1): Pt will perform sit <>stand w/mod A and LRAD PT Short Term Goal 2 - Progress (Week 1): Met PT Short Term Goal 3 (Week 1): Pt will perform bed <>chair transfers w/LRAD and mod A PT Short Term Goal 3 - Progress (Week 1): Met Week 2:  PT Short Term Goal 1 (Week 2): Pt will perform least restrictive transfer with min A PT Short Term Goal 1 - Progress (Week 2): Met PT Short Term Goal 2 (Week 2): Pt will initiate gait training outside of // bars PT Short Term Goal 2 - Progress (Week 2): Met PT Short Term Goal 3 (Week 2): Pt will perform w/c mobility x 50 ft PT Short Term Goal 3 - Progress (Week 2): Met PT Short Term Goal 4 (Week 2): Pt will tolerate sitting up OOB x 2 hours PT Short Term Goal 4 - Progress (Week 2): Met Week 3:  PT Short Term Goal 1 (Week 3): Pt will ambulate at least 23f using LRAD with min assist PT Short Term Goal 2 (Week 3): Pt will ascend/descend 4 (6" steps) with max assist using HRs PT Short Term Goal 3 (Week 3): Pt will perform simulated car transfer using LRAD with mod assist  Skilled Therapeutic Interventions/Progress Updates:   Pt received in recliner and agreeable to therapy.  No complaint of pain. Pt performed ambulatory transfer to w/c with CGA and RW. Sit to stand with CGA at beginning of session, but required mod - max A d/t extensor tone later in session as she became fatigued. Tone diminished as pt came upright and began walking. Pt utilized kinetron x 6 min at 50 cm/sec for improved endurance and LE strength. Gait x 250 ft, x 200 ft with extended rest break, CGA with RW. Pt was  able to recall cues given from previous therapists and apply them to current situation. VC focused on increasing speed to normalize gait mechanics and improve endurance. Pt returned to recliner after session and was left with all needs in reach and alarm active.   Therapy Documentation Precautions:  Precautions Precautions: Fall Precaution Comments: ataxic, sensation of falling with movement, anxious Restrictions Weight Bearing Restrictions: No    Therapy/Group: Individual Therapy  OMickel Fuchs9/15/2022, 3:41 PM

## 2020-09-27 NOTE — Progress Notes (Signed)
Bowel program started. No BM

## 2020-09-27 NOTE — Progress Notes (Signed)
Occupational Therapy Weekly Progress Note  Patient Details  Name: [name] MRN: [id number] Date of Birth: [birth year]  Beginning of progress report period: September 20, 2020 End of progress report period: September 27, 2020  Patient has met 3 of 3 short term goals.  Pt continues to demonstrate progress with BADLs and functional transfers although somewhat inconsistently. Pt continues to demonstrate increase anxiousness with all transitional movements (sitting or standing) and when performing activities outside BOS. Pt completes bathing tasks with min A, LB dressing tasks with mod A, and toileting tasks with CGA. Functional transfers with CGA using RW. Pt's husband has been present for therapy sessions and is scheduled for continued education.   Patient continues to demonstrate the following deficits: muscle weakness and muscle joint tightness, decreased cardiorespiratoy endurance, impaired timing and sequencing, abnormal tone, unbalanced muscle activation, and decreased coordination, and decreased sitting balance and decreased standing balance and therefore will continue to benefit from skilled OT intervention to enhance overall performance with BADL and Reduce care partner burden.  Patient progressing toward long term goals..  Continue plan of care.  OT Short Term Goals Week 3:  OT Short Term Goal 1 (Week 3): Pt will perform sit<>stand with mod A using RW in preparation for pulling pants over hips OT Short Term Goal 1 - Progress (Week 3): Met OT Short Term Goal 2 (Week 3): Pt will complete LB bathing with min A seated EOB using lateral leans OT Short Term Goal 2 - Progress (Week 3): Met OT Short Term Goal 3 (Week 3): Pt will complete toileting with mod A OT Short Term Goal 3 - Progress (Week 3): Met Week 4:  OT Short Term Goal 1 (Week 4): STG=LTG 2/2 ELOS (continue working towards supervision/min A LTGs)  Pt     [name] 09/27/2020, 2:41 PM

## 2020-09-27 NOTE — Progress Notes (Signed)
Physical Therapy Weekly Progress Note  Patient Details  Name: LESLEA VOWLES MRN: [id number] Date of Birth: 06/19/1955  Beginning of progress report period: September 21, 2020 End of progress report period: September 28, 2020  Today's Date: 09/29/2020   Patient has met 3 of 3 short term goals.  Pt has made significant gains during current week of therapy. Although anxiety can be a limiting factor at times which has been noted to trigger extensor tone pt has been able to consistently perform transfers and ambulation at [place]. Pt has been working on stair training and is able to ascend with 1 rail with minA and 2 rails with CGA. Pt can descend with 1 rail with CGA and verbal cues to improve erect posture limiting extensor tone. Pt's husband has been present for some sessions and family edu has been incorporated into these sessions with hands on training to begin asap. Pt currently on track to reach all goals.   Patient continues to demonstrate the following deficits muscle weakness and muscle paralysis and impaired timing and sequencing, abnormal tone, unbalanced muscle activation, ataxia, and decreased coordination and therefore will continue to benefit from skilled PT intervention to increase functional independence with mobility.  Patient progressing toward long term goals..  Continue plan of care.  PT Short Term Goals Week 3:  PT Short Term Goal 1 (Week 3): Pt will ambulate at least 68f using LRAD with min assist PT Short Term Goal 1 - Progress (Week 3): Met PT Short Term Goal 2 (Week 3): Pt will ascend/descend 4 (6" steps) with max assist using HRs PT Short Term Goal 2 - Progress (Week 3): Met PT Short Term Goal 3 (Week 3): Pt will perform simulated car transfer using LRAD with mod assist PT Short Term Goal 3 - Progress (Week 3): Met Week 4:  PT Short Term Goal 1 (Week 4): =LTG due to ELOS  Ambulation/gait training;Community reintegration;DME/adaptive equipment instruction;Neuromuscular  re-education;Psychosocial support;Stair training;UE/LE Strength taining/ROM;Wheelchair propulsion/positioning;Balance/vestibular training;Discharge planning;Functional electrical stimulation;Pain management;Therapeutic Activities;UE/LE Coordination activities;Cognitive remediation/compensation;Disease management/prevention;Functional mobility training;Patient/family education;Splinting/orthotics;Therapeutic Exercise;Visual/perceptual remediation/compensation;Skin care/wound management   Therapy Documentation Precautions:  Precautions Precautions: Fall Precaution Comments: ataxic, sensation of falling with movement, anxious Restrictions Weight Bearing Restrictions: No   Therapy/Group: Individual Therapy   TExcell Seltzer PT, DPT, CSRS 09/29/2020, 7:58 AM

## 2020-09-27 NOTE — Progress Notes (Signed)
Occupational Therapy Session Note  Patient Details  Name: Diane Willis MRN: [id number] Date of Birth: [birth year]  Today's Date: 09/27/2020 OT Individual Time: 4782-9562 OT Individual Time Calculation (min): 72 min    Short Term Goals: Week 3:  OT Short Term Goal 1 (Week 3): Pt will perform sit<>stand with mod A using RW in preparation for pulling pants over hips OT Short Term Goal 2 (Week 3): Pt will complete LB bathing with min A seated EOB using lateral leans OT Short Term Goal 3 (Week 3): Pt will complete toileting with mod A  Skilled Therapeutic Interventions/Progress Updates:    Pt resting in recliner upon arrival. Pt declined bathing/dressing this morning stating she completed earlier in morning. Pt practiced walk-in shower transfers with RW requiring min A and min verbal cues for technique. Pt engaged in dynamic standing tasks placing clothes pins on basketball goal with Rt hand. Min A for standing balance during tasks. Sit<>stand with CGA. Dynamic sitting tasks tossing ball agains bounce back with posterior lean noted. Pt requires min verbal cues to correct. Pt engaged in anterior reaching task with card board. Pt able to reach forward past her feet with min tactile cues to lean forward. Pt with increased anxiety when performing task. Pt returned to room and amb with RW to recliner. Pt remained in recliner with all needs within reach and belt alarm activated.   Therapy Documentation Precautions:  Precautions Precautions: Fall Precaution Comments: ataxic, sensation of falling with movement, anxious Restrictions Weight Bearing Restrictions: No Pain:  Pt denies pain this morning   Therapy/Group: Individual Therapy  Rich Brave 09/27/2020, 10:43 AM

## 2020-09-27 NOTE — Progress Notes (Signed)
Hypoglycemic Event  CBG: 65  Treatment: 4 oz juice/soda  Symptoms: None none Follow-up CBG: Time:1202 CBG Result:74  Possible Reasons for Event: Unknown unknown Comments/MD notified: Followed protocol   Diane Willis

## 2020-09-28 DIAGNOSIS — R7989 Other specified abnormal findings of blood chemistry: Secondary | ICD-10-CM

## 2020-09-28 LAB — GLUCOSE, CAPILLARY
Glucose-Capillary: 197 mg/dL — ABNORMAL HIGH (ref 70–99)
Glucose-Capillary: 120 mg/dL — ABNORMAL HIGH (ref 70–99)
Glucose-Capillary: 269 mg/dL — ABNORMAL HIGH (ref 70–99)
Glucose-Capillary: 154 mg/dL — ABNORMAL HIGH (ref 70–99)

## 2020-09-28 MED ORDER — INSULIN ASPART PROT & ASPART (70-30 MIX) 100 UNIT/ML ~~LOC~~ SUSP
18.0000 [IU] | Freq: Every day | SUBCUTANEOUS | Status: DC
  Administered 2020-09-29 – 2020-09-30 (×2): 18 [IU] via SUBCUTANEOUS

## 2020-09-28 NOTE — Progress Notes (Signed)
Bowel program started at 1845. Will report off to oncoming staff.   Marylu Lund, RN

## 2020-09-28 NOTE — Progress Notes (Signed)
Recreational Therapy Session Note  Patient Details  Name: Diane Willis MRN: [id number] Date of Birth: 06-17-1955 Today's Date: 09/28/2020  Pain: no c/o Skilled Therapeutic Interventions/Progress Updates: Session focused on patient education.  Discussed activity analysis identifying potential modifications for leisure and home management which included energy conservation techniques with min cues.  Discussed holistic health including social, emotional, cognitive & spiritual in addition to physical health and how they work together to make up our overall wellness. Pt stated understanding and appreciation for education.   Therapy/Group: Co-Treatment Runa Whittingham 09/28/2020, 8:36 AM

## 2020-09-28 NOTE — Progress Notes (Signed)
Physical Therapy Session Note  Patient Details  Name: Diane Willis MRN: [id number] Date of Birth: 1955/06/19  Today's Date: 09/28/2020 PT Individual Time: 9030-0923 PT Individual Time Calculation (min): 72 min   Short Term Goals: Week 3:  PT Short Term Goal 1 (Week 3): Pt will ambulate at least 67f using LRAD with min assist PT Short Term Goal 2 (Week 3): Pt will ascend/descend 4 (6" steps) with max assist using HRs PT Short Term Goal 3 (Week 3): Pt will perform simulated car transfer using LRAD with mod assist  Skilled Therapeutic Interventions/Progress Updates: Pt presented in recliner agreeable to therapy. Pt denies pain during session. Pt performed ambulatory transfer to w/c with RW and CGA. NT arrived for pt's vitals (WNL) and once completed pt transported to rehab gym for energy conservation. Pt then ambulated ~1255fwith RW and CGA. After brief seated rest participated in ascending/descending x 4 stairs x 2. On first attempt pt used bilateral rails to ascend then used R rail only to descend. Pt did require verbal cues for anterior translation of hips to improve posture. On second attempt pt was able to ascend/descend using [facility] only [facility] then [facility] outside and discussed extensively energy conservation techniques in preparation for d/c. Pt then ambulated around water fountain x 2 with seated rest between bouts. Pt initially required verbal cues for technique as became fearful when stepping over cracks in cement however demonstrated increased confidence with repetition and ambulated with smoother cadence during second bout. Pt transported back to room at end of session and agreeable to remain in w/c at following OT session dovetailing PT session. Pt left in w/c in room with call bell within reach and needs met.       Therapy Documentation Precautions:  Precautions Precautions: Fall Precaution Comments: ataxic, sensation of falling with movement,  anxious Restrictions Weight Bearing Restrictions: No General:   Vital Signs: Therapy Vitals Temp: 98.7 F (37.1 C) Temp Source: Oral Pulse Rate: 67 Resp: 18 BP: (!) 166/67 Patient Position (if appropriate): Sitting Oxygen Therapy SpO2: 100 % O2 Device: Room Air Pain: Pain Assessment Pain Scale: Faces Pain Score: 0-No pain   Therapy/Group: Individual Therapy  Khamiya Varin Joshua Zeringue, PTA  09/28/2020, 3:00 PM

## 2020-09-28 NOTE — Consult Note (Signed)
Neuropsychological Consultation   Patient:   Diane Willis   DOB:   Jan 10, 1956  MR Number:  [id number]  Location:  [facility] [address] CENTER A [id number] [place] [address] Dept: [phone] Loc: [phone]           Date of Service:   09/28/2020  Start Time:   8 AM End Time:   9 AM  Provider/Observer:  Arley Phenix, Psy.D.       Clinical Neuropsychologist       Billing Code/Service: [id number]  Chief Complaint:    Diane Willis is a 65 year old female with a history of type 2 diabetes, hypertension, anxiety disorder, prior COVID-19 complicated by ARDS/PE on 04/2019.  Patient was admitted on 08/30/2020 with 3-week history of progressive BLE weakness with tingling bilateral feet and inability to walk.  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  Neurology was consulted and patient was noted to have sensory and proprioceptive deficits bilateral feet BLA and fingertips concerning for large fiber neuropathy and likely related to B12 deficiency.  Patient refused to remove socks for exam of feet and continues to have a great deal of anxiety and refusing to have feet examined.  Labs revealed B12 and thiamine deficiency with elevated MMA and homocystine levels.  Patient was started on IM B12 supplement with improvement in symptoms.  Patient has continued to have a good deal of anxiety particularly around fear of falling even with active support during therapies and continuing to be anxious and refused foot exam.  On 05/03/2019 the patient presented to the ED with anxiety.  This was post her discharge from COVID infection.  She was satting at 100% on room air but described significant development of anxiety and shortness of breath and fears around her  current medical status even though she is doing quite well.  Reason for Service:  Patient was referred for neuropsychological consultation due to coping and adjustment issues and exacerbation of acute anxiety symptoms in the setting of long-term anxiety.  Below is the HPI for the current admission.  HPI: Diane Willis is a 65 year old R handed female with history of T2DM- A1c of 6.7, HTN, anxiety d/o, Covid 19  complicated by ARDS/PE 04/2019 who was admitted on 08/30/20 with 3 week history of progressive BLE weakness with tingling bilateral fee and inability to walk.  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  Neurology consulted for input and patient noted to have sensory and proprioceptive deficits bilateral feet BLE and finger tips concerning for large fiber neuropathy and concerning for B12 deficiency. She has refused to remove socks for exam of feet.  Labs revealed Vitamin B12 and thiamine deficiency with elevated MMA and homocysteine levels. Zinc and copper WNL.  She was started on IM B12 supplement with improvement in symptoms. Neurology recommends life long vitamin B 12 supplement after loading dose for dorsal column syndrome.   Current Status:  Patient was oriented with positive affect but admitted to a great deal of anxiety that developed when she started having strange somatosensory experiences and numbness in her hands and feet.  Patient reports that she  had never had fears of people looking at her feet or anxiety around those types of symptoms and reports that these developed with the development of her symptoms.  Patient denies any significant cognitive deficits currently and reports that her memory appears to be at baseline.  Patient was oriented and aware of the events recently.  Patient reports that she is not sure why  she has such a fear of having her feet looked at or touched/examined.  She reports that she has looked closely at her feet and has not noticed any abnormalities but continues to have strange sensations when they are touched and this is likely playing a role in some of her fear of having been examined.   Behavioral Observation: Diane Willis  presents as a 65 y.o.-year-old Right African American Female who appeared her stated age. her dress was Appropriate and she was Well Groomed and her manners were Appropriate to the situation.  her participation was indicative of Appropriate behaviors.  There were physical disabilities noted.  she displayed an appropriate level of cooperation and motivation.     Interactions:    Active Appropriate  Attention:   within normal limits and attention span and concentration were age appropriate  Memory:   within normal limits; recent and remote memory intact  Visuo-spatial:  not examined  Speech (Volume):  normal  Speech:   normal; normal  Thought Process:  Coherent and Relevant  Though Content:  WNL; not suicidal and not homicidal  Orientation:   person, place, time/date, and situation  Judgment:   Fair  Planning:   Fair  Affect:    Anxious  Mood:    Anxious  Insight:   Good  Intelligence:   normal  Medical History:   Past Medical History:  Diagnosis Date   COVID-19 04/2019   Diabetes mellitus without complication (HCC)    Hypertension    Pulmonary emboli (HCC)--presumed post Covid          Patient Active Problem List   Diagnosis Date Noted   Vitamin B12 deficiency    Macrocytic anemia    Anxiety about health    Labile blood pressure    Labile blood glucose    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  However, she does acknowledge that during her acute respiratory failure with hypoxia during her COVID illness in April 2021 that she did have anxiety at that point as well.  Family Med/Psych History:  Family History  Problem Relation Age of Onset   Healthy Mother    COPD Father    Healthy Sister    HIV Brother    Healthy Sister    HIV Sister    HIV Brother    HIV Brother    HIV Brother    HIV Brother    CAD Neg Hx    Diabetes Mellitus II Neg Hx    Impression/DX:  Diane Willis is a 65 year old female with a history of type 2 diabetes, hypertension, anxiety disorder,  prior COVID-19 complicated by ARDS/PE on 04/2019.  Patient was admitted on 08/30/2020 with 3-week history of progressive BLE weakness with tingling bilateral feet and inability to walk.  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  Neurology was consulted and patient was noted to have sensory and proprioceptive deficits bilateral feet BLA and fingertips concerning for large fiber neuropathy and likely related to B12 deficiency.  Patient refused to remove socks for exam of feet and continues to have a great deal of anxiety and refusing to have feet examined.  Labs revealed B12 and thiamine deficiency with elevated MMA and homocystine levels.  Patient was started on IM B12 supplement with improvement in symptoms.  Patient has continued to have a good deal of anxiety particularly around fear of falling even with active support during therapies and  continuing to be anxious and refused foot exam.  On 05/03/2019 the patient presented to the ED with anxiety.  This was post her discharge from COVID infection.  She was satting at 100% on room air but described significant development of anxiety and shortness of breath and fears around her current medical status even though she is doing quite well.  Patient was oriented with positive affect but admitted to a great deal of anxiety that developed when she started having strange somatosensory experiences and numbness in her hands and feet.  Patient reports that she had never had fears of people looking at her feet or anxiety around those types of symptoms and reports that these developed with the development of her symptoms.  Patient denies any significant cognitive deficits currently and reports that her memory appears to be at baseline.  Patient was oriented and aware of the events recently.  Patient reports that she is not sure why she has such a fear of having her feet looked at or touched/examined.  She reports that she has looked closely at her feet and has not noticed any abnormalities but continues to have strange sensations when they are touched and this is likely playing a role in some of her fear of having been examined.  Patient did develop significant anxiety and even a presentation to the ED on 05/03/2019 after her discharge from COVID-19 hospitalization and then developing a bilateral pulmonary emboli on June of that year.  Disposition/Plan:  Worked on coping and adjustment issues around anxiety of various types particular around issues of falling as well as worked on issues with her fear of having feet exam done being touched by others.  The patient had difficulty putting into words what her fear was and denied having the symptoms prior.  However, it is likely due to experiencing significant unusual somatosensory experiences for her around touch that have been generalized to some degree.  Patient  agreed to allow for foot exam in the future so she could begin to desensitize around these fears.           Electronically Signed   _______________________ Arley Phenix, Psy.D. Clinical Neuropsychologist

## 2020-09-28 NOTE — Progress Notes (Signed)
Occupational Therapy Session Note  Patient Details  Name: Diane Willis MRN: [id number] Date of Birth: [birth year]  Today's Date: 09/28/2020 OT Individual Time: 2992-4268 OT Individual Time Calculation (min): 43 min    Short Term Goals: Week 4:  OT Short Term Goal 1 (Week 4): STG=LTG 2/2 ELOS (continue working towards supervision/min A LTGs)  Skilled Therapeutic Interventions/Progress Updates:    Pt in bed to start with request to work on washing up some and donning new scrubs.  She was able to transfer to the EOB with supervision using the rails with HOB elevated.  Supervision for scooting to the EOB as well in preparation for standing.  Noted increased extensor posturing in LEs with pt being able to flex them back in order to prepare for sit to stand.  Min assist for sit to stand from the bed to pivot to the wheelchair.  She was then positioned at the sink where she completed UB bathing and dressing at supervision and LB bathing at min assist.  She utilized the reacher for donning underpants and pants over her feet with min assist for standing to pull them up.  Mod assist was needed for crossing her LEs to remove gripper socks secondary to not being able to keep them crossed over the opposite knee.  Max assist for donning and tying shoes.  Finished session with transfer over to the recliner with the call button and phone in reach and safety alarm belt in place.  NT in room as well.    Therapy Documentation Precautions:  Precautions Precautions: Fall Precaution Comments: ataxic, sensation of falling with movement, anxious Restrictions Weight Bearing Restrictions: No  Pain: Pain Assessment Pain Scale: Faces Pain Score: 0-No pain ADL: See Care Tool Section for some details of mobility and selfcare   Therapy/Group: Individual Therapy  Batul Diego OTR/L 09/28/2020, 12:38 PM

## 2020-09-28 NOTE — Progress Notes (Signed)
PROGRESS NOTE   Subjective/Complaints: Patient seen sitting up in her bed this morning.  She states she slept well overnight.  She states she would like to bathe.  ROS: Denies CP, SOB, N/V/D  Objective:   No results found. Recent Labs    09/27/20 0517  WBC 6.2  HGB 8.4*  HCT 27.3*  PLT 152     No results for input(s): NA, K, CL, CO2, GLUCOSE, BUN, CREATININE, CALCIUM in the last 72 hours.    Intake/Output Summary (Last 24 hours) at 09/28/2020 1102 Last data filed at 09/28/2020 0700 Gross per 24 hour  Intake 697 ml  Output --  Net 697 ml          Physical Exam: Vital Signs Blood pressure (!) 156/80, pulse 71, temperature 98.2 F (36.8 C), resp. rate 17, height 5\' 1"  (1.549 m), weight 81.9 kg, SpO2 100 %. Constitutional: No distress . Vital signs reviewed. HENT: Normocephalic.  Atraumatic. Eyes: EOMI. No discharge. Cardiovascular: No JVD.  RRR. Respiratory: Normal effort.  No stridor.  Bilateral clear to auscultation. GI: Non-distended.  BS +. Skin: Warm and dry.  Intact. Psych: Normal mood.  Normal behavior. Musc: No edema in extremities.  No tenderness in extremities. Neurological: Alert and oriented Sensation decreased to light touch in hands and below knees B/L, stable Motor: : RUE- 4/5 proximal to distal LUE- 4/5 proximal to distal RLE- HF 4-/5, stable LLE- HF 4-/5     Assessment/Plan: 1. Functional deficits which require 3+ hours per day of interdisciplinary therapy in a comprehensive inpatient rehab setting. Physiatrist is providing close team supervision and 24 hour management of active medical problems listed below. Physiatrist and rehab team continue to assess barriers to discharge/monitor patient progress toward functional and medical goals  Care Tool:  Bathing    Body parts bathed by patient: Right arm, Left arm, Chest, Abdomen, Front perineal area, Buttocks, Right upper leg, Left upper  leg, Face   Body parts bathed by helper: Right lower leg, Left lower leg Body parts n/a: Buttocks, Left lower leg, Right lower leg   Bathing assist Assist Level: Minimal Assistance - Patient > 75%     Upper Body Dressing/Undressing Upper body dressing   What is the patient wearing?: Pull over shirt    Upper body assist Assist Level: Set up assist    Lower Body Dressing/Undressing Lower body dressing      What is the patient wearing?: Incontinence brief, Pants     Lower body assist Assist for lower body dressing: Moderate Assistance - Patient 50 - 74%     Toileting Toileting    Toileting assist Assist for toileting: Minimal Assistance - Patient > 75%     Transfers Chair/bed transfer  Transfers assist     Chair/bed transfer assist level: Contact Guard/Touching assist Chair/bed transfer assistive device:   Ambulation assist   Ambulation activity did not occur: Safety/medical concerns (Anxiety, BLE weakness, SCI)  Assist level: Contact Guard/Touching assist Assistive device: Walker-rolling Max distance: 200 ft   Walk 10 feet activity   Assist  Walk 10 feet activity did not occur: Safety/medical concerns (Anxiety, BLE weakness, SCI)  Assist level:  Contact Guard/Touching assist Assistive device: Walker-rolling   Walk 50 feet activity   Assist Walk 50 feet with 2 turns activity did not occur: Safety/medical concerns  Assist level: Contact Guard/Touching assist Assistive device: Walker-rolling    Walk 150 feet activity   Assist Walk 150 feet activity did not occur: Safety/medical concerns (Anxiety, BLE weakness, SCI)  Assist level: Contact Guard/Touching assist Assistive device: Walker-rolling    Walk 10 feet on uneven surface  activity   Assist Walk 10 feet on uneven surfaces activity did not occur: Safety/medical concerns (Anxiety, BLE weakness, SCI)         Wheelchair     Assist Is the patient using  a wheelchair?: Yes Type of Wheelchair: Manual Wheelchair activity did not occur: Safety/medical concerns (High anxiety, fear-avoidance behavior)  Wheelchair assist level: Minimal Assistance - Patient > 75% Max wheelchair distance: 150'    Wheelchair 50 feet with 2 turns activity    Assist    Wheelchair 50 feet with 2 turns activity did not occur: Safety/medical concerns (High anxiety, fear-avoidance behavior)   Assist Level: Minimal Assistance - Patient > 75%   Wheelchair 150 feet activity     Assist  Wheelchair 150 feet activity did not occur: Safety/medical concerns (High anxiety, fear-avoidance behavior)   Assist Level: Minimal Assistance - Patient > 75%    Medical Problem List and Plan: 1.  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  Antithrombotics: -DVT/anticoagulation:  Mechanical: Sequential compression devices, below knee Bilateral lower extremities  Dopplers (-) on 8/27 Lovenox              -antiplatelet therapy: ASA             --H/o PE last year 3. Pain Management: Tylenol prn.  4. Mood: LCSW to follow for evaluation and support.              -antipsychotic agents: N/A 5. Neuropsych: This patient is capable of making decisions on his own behalf. 6. Skin/Wound Care: Routine pressure relief measures.  7. Fluids/Electrolytes/Nutrition: Monitor I/Os 8.  T2DM with hyperglycemia: Hgb A1C-7.8--monitor BS ac/hs and use SSI for elevated Bs             Insulin 70/30 decreased to 20 daily on 9/15, decreased to 18 on 9/17  Insulin 70/30 decreased to 10 nightly on 9/14, increased to 15 on 9/15   CBG (last 3)  Recent Labs    09/27/20 2115 09/27/20 2203 09/28/20 0536  GLUCAP 150* 145* 197*    Labile on 9/16 9. Vitamin B12: B12< 50.               --Continue B12 1000 mcg daily X 7 days-->weekly for 7 weeks then monthly.  --lifelong B12 1000 mcg monthly supplement after loading doses.   10. 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 Thrombocytopenia: Resolved 11. Vitamin B1 deficiency: Thiamine 53.1--continue supplement.  12. Anxiety d/o: Continue Xanax prn.  13. 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  Slightly labile on 9/16, monitor for trend 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  Continue bowel program 15.  Spasticity-  Increased baclofen to 10 mg TID. For extensor tone.    Stable on 9/15 16. Anxiety/fear of falling- Continue Paxil 20 mg nightly for anxiety since already on Xanax prn  17.  Macrocytic anemia  Severely low vitamin B12, continue supplementation Hemoglobin 8.4 on 9/15 18.  Elevated  creatinine  Creatinine 1.01 on 9/12, labs ordered for Monday   LOS: 22 days A FACE TO FACE EVALUATION WAS PERFORMED  Tnya Ades Karis Juba 09/28/2020, 11:02 AM

## 2020-09-29 LAB — GLUCOSE, CAPILLARY
Glucose-Capillary: 181 mg/dL — ABNORMAL HIGH (ref 70–99)
Glucose-Capillary: 47 mg/dL — ABNORMAL LOW (ref 70–99)
Glucose-Capillary: 105 mg/dL — ABNORMAL HIGH (ref 70–99)
Glucose-Capillary: 294 mg/dL — ABNORMAL HIGH (ref 70–99)
Glucose-Capillary: 306 mg/dL — ABNORMAL HIGH (ref 70–99)

## 2020-09-29 NOTE — Progress Notes (Signed)
Occupational Therapy Session Note  Patient Details  Name: Diane Willis MRN: [id number] Date of Birth: 11-Feb-1955  Today's Date: 09/29/2020 OT Individual Time: 1100-1158 OT Individual Time Calculation (min): 58 min    Short Term Goals: Week 3:  OT Short Term Goal 1 (Week 3): Pt will perform sit<>stand with mod A using RW in preparation for pulling pants over hips OT Short Term Goal 1 - Progress (Week 3): Met OT Short Term Goal 2 (Week 3): Pt will complete LB bathing with min A seated EOB using lateral leans OT Short Term Goal 2 - Progress (Week 3): Met OT Short Term Goal 3 (Week 3): Pt will complete toileting with mod A OT Short Term Goal 3 - Progress (Week 3): Met Week 4:  OT Short Term Goal 1 (Week 4): STG=LTG 2/2 ELOS (continue working towards supervision/min A LTGs)  Skilled Therapeutic Interventions/Progress Updates:    Pt greeted at time of session semireclined in bed resting agreeable to OT session, no pain throughout session. Supine > sit Supervision and stand pivot bed > wheelchair Min A with RW. Set up at sink for UB/LB bathing (declined shower) and Min A for LB bathing sit > stand buttocks only, able to figure four for BLEs past knee level. Declined washing feet. UB dress set up, LB dressing with reacher Min A to thread RLE and fully get up pants in the back over buttocks. Note pt needed extended time for all tasks, needing cues for hip extension and bringing buttocks forward to fully get up pants. Transported to gym for time management and performed 1x15-20 reps of bicep curls, chest press, overhead press for global strengthening and endurance. Stand pivot wheelchair > recliner CGA/Min w/ RW cues for anterior weight shift, alarm on call bell in reach.   Therapy Documentation Precautions:  Precautions Precautions: Fall Precaution Comments: ataxic, sensation of falling with movement, anxious Restrictions Weight Bearing Restrictions: No     Therapy/Group: Individual  Therapy  Viona Gilmore 09/29/2020, 7:23 AM

## 2020-09-29 NOTE — Progress Notes (Signed)
Physical Therapy Session Note  Patient Details  Name: Diane Willis MRN: [id number] Date of Birth: [birth year]  Today's Date: 09/29/2020 PT Individual Time: 1300-1400 PT Individual Time Calculation (min): 60 min   Short Term Goals: Week 4:  PT Short Term Goal 1 (Week 4): =LTG due to ELOS  Skilled Therapeutic Interventions/Progress Updates:    Pt received seated in recliner in room, agreeable to PT session. No complaints of pain. Sit to stand with CGA to RW. Ambulation x 150 ft with RW and CGA, cues for upright posture during gait. Pt reports some difficulty with mobility this PM due to feeling very full from lunch. Ascend/descend 6 x 6" stairs laterally with R handrail and min A for balance, cues for trunk extension and upright posture. Trial use of RW to get to/from stair rail to simulate home environment with min A and increased time and encouragement due to patient anxiety. Pt able to ascend/descend 3 x 6" stairs with R handrail laterally with min A with use of RW at bottom of stairs to come to/from w/c. Standing alt L/R 4" step-taps with RW and min A for balance, 2 x 10 reps. Sidesteps L/R 2 x 10 ft each direction with RW and min A for balance. Pt has onset of severe posterior lean following sidesteps, able to correct with max cueing and increased time to prevent fall posteriorly. Pt left seated in recliner in room with needs in reach, quick release belt in place at end of session.  Therapy Documentation Precautions:  Precautions Precautions: Fall Precaution Comments: ataxic, sensation of falling with movement, anxious Restrictions Weight Bearing Restrictions: No    Therapy/Group: Individual Therapy   Peter Congo, PT, DPT, CSRS  09/29/2020, 4:58 PM

## 2020-09-30 LAB — GLUCOSE, CAPILLARY
Glucose-Capillary: 227 mg/dL — ABNORMAL HIGH (ref 70–99)
Glucose-Capillary: 242 mg/dL — ABNORMAL HIGH (ref 70–99)
Glucose-Capillary: 282 mg/dL — ABNORMAL HIGH (ref 70–99)
Glucose-Capillary: 137 mg/dL — ABNORMAL HIGH (ref 70–99)

## 2020-09-30 MED ORDER — INSULIN ASPART PROT & ASPART (70-30 MIX) 100 UNIT/ML ~~LOC~~ SUSP
15.0000 [IU] | Freq: Every day | SUBCUTANEOUS | Status: DC
  Administered 2020-10-01 – 2020-10-03 (×3): 15 [IU] via SUBCUTANEOUS

## 2020-09-30 NOTE — Progress Notes (Signed)
PROGRESS NOTE   Subjective/Complaints: Patient seen laying in bed this morning.  She states she slept well overnight.  She denies complaints.  ROS: Denies CP, SOB, N/V/D  Objective:   No results found. No results for input(s): WBC, HGB, HCT, PLT in the last 72 hours.   No results for input(s): NA, K, CL, CO2, GLUCOSE, BUN, CREATININE, CALCIUM in the last 72 hours.    Intake/Output Summary (Last 24 hours) at 09/30/2020 0958 Last data filed at 09/30/2020 0730 Gross per 24 hour  Intake 594 ml  Output --  Net 594 ml          Physical Exam: Vital Signs Blood pressure 138/65, pulse 73, temperature 98.6 F (37 C), temperature source Oral, resp. rate 16, height 5\' 1"  (1.549 m), weight 81.9 kg, SpO2 100 %. Constitutional: No distress . Vital signs reviewed. HENT: Normocephalic.  Atraumatic. Eyes: EOMI. No discharge. Cardiovascular: No JVD.  RRR. Respiratory: Normal effort.  No stridor.  Bilateral clear to auscultation. GI: Non-distended.  BS +. Skin: Warm and dry.  Intact. Psych: Normal mood.  Normal behavior. Musc: No edema in extremities.  No tenderness in extremities. Neuro: Alert and oriented Sensation decreased to light touch in hands and below knees B/L, stable Motor: : RUE- 4/5 proximal to distal LUE- 4/5 proximal to distal RLE- HF 4--4/5, stable LLE- HF 4--4/5     Assessment/Plan: 1. Functional deficits which require 3+ hours per day of interdisciplinary therapy in a comprehensive inpatient rehab setting. Physiatrist is providing close team supervision and 24 hour management of active medical problems listed below. Physiatrist and rehab team continue to assess barriers to discharge/monitor patient progress toward functional and medical goals  Care Tool:  Bathing    Body parts bathed by patient: Right arm, Left arm, Chest, Abdomen, Front perineal area, Buttocks, Right upper leg, Left upper leg, Face, Right  lower leg, Left lower leg   Body parts bathed by helper: Buttocks Body parts n/a: Left lower leg, Right lower leg   Bathing assist Assist Level: Minimal Assistance - Patient > 75%     Upper Body Dressing/Undressing Upper body dressing   What is the patient wearing?: Pull over shirt    Upper body assist Assist Level: Set up assist    Lower Body Dressing/Undressing Lower body dressing      What is the patient wearing?: Incontinence brief, Pants     Lower body assist Assist for lower body dressing: Moderate Assistance - Patient 50 - 74%     Toileting Toileting    Toileting assist Assist for toileting: Minimal Assistance - Patient > 75%     Transfers Chair/bed transfer  Transfers assist     Chair/bed transfer assist level: Minimal Assistance - Patient > 75% Chair/bed transfer assistive device:   Ambulation assist   Ambulation activity did not occur: Safety/medical concerns (Anxiety, BLE weakness, SCI)  Assist level: Minimal Assistance - Patient > 75% Assistive device: Walker-rolling Max distance: 150'   Walk 10 feet activity   Assist  Walk 10 feet activity did not occur: Safety/medical concerns (Anxiety, BLE weakness, SCI)  Assist level: Minimal Assistance - Patient > 75% Assistive  device: Walker-rolling   Walk 50 feet activity   Assist Walk 50 feet with 2 turns activity did not occur: Safety/medical concerns  Assist level: Minimal Assistance - Patient > 75% Assistive device: Walker-rolling    Walk 150 feet activity   Assist Walk 150 feet activity did not occur: Safety/medical concerns (Anxiety, BLE weakness, SCI)  Assist level: Minimal Assistance - Patient > 75% Assistive device: Walker-rolling    Walk 10 feet on uneven surface  activity   Assist Walk 10 feet on uneven surfaces activity did not occur: Safety/medical concerns (Anxiety, BLE weakness, SCI)         Wheelchair     Assist Is the patient  using a wheelchair?: Yes Type of Wheelchair: Manual Wheelchair activity did not occur: Safety/medical concerns (High anxiety, fear-avoidance behavior)  Wheelchair assist level: Minimal Assistance - Patient > 75% Max wheelchair distance: 150'    Wheelchair 50 feet with 2 turns activity    Assist    Wheelchair 50 feet with 2 turns activity did not occur: Safety/medical concerns (High anxiety, fear-avoidance behavior)   Assist Level: Minimal Assistance - Patient > 75%   Wheelchair 150 feet activity     Assist  Wheelchair 150 feet activity did not occur: Safety/medical concerns (High anxiety, fear-avoidance behavior)   Assist Level: Minimal Assistance - Patient > 75%    Medical Problem List and Plan: 1.  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  Antithrombotics: -DVT/anticoagulation:  Mechanical: Sequential compression devices, below knee Bilateral lower extremities  Dopplers (-) on 8/27 Lovenox              -antiplatelet therapy: ASA             --H/o PE last year 3. Pain Management: Tylenol prn.  4. Mood: LCSW to follow for evaluation and support.              -antipsychotic agents: N/A 5. Neuropsych: This patient is capable of making decisions on his own behalf. 6. Skin/Wound Care: Routine pressure relief measures.  7. Fluids/Electrolytes/Nutrition: Monitor I/Os 8.  T2DM with hyperglycemia: Hgb A1C-7.8--monitor BS ac/hs and use SSI for elevated Bs             Insulin 70/30 decreased to 20 daily on 9/15, decreased to 18 on 9/17, decreased to 15 on 9/19  Insulin 70/30 decreased to 10 nightly on 9/14, increased to 15 on 9/15   CBG (last 3)  Recent Labs    09/29/20 1713 09/29/20 2055 09/30/20 0605  GLUCAP 294* 306* 227*    Labile on 9/18 9. Vitamin B12: B12< 50.               --Continue B12 1000 mcg daily X 7 days-->weekly for 7 weeks then monthly.  --lifelong B12 1000 mcg monthly  supplement after loading doses.  10. 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 Thrombocytopenia: Resolved 11. Vitamin B1 deficiency: Thiamine 53.1--continue supplement.  12. Anxiety d/o: Continue Xanax prn.  13. 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  Relatively controlled on 9/18, monitor for trend 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  Continue bowel program 15.  Spasticity-  Increased baclofen to 10 mg TID. For extensor tone.    Stable on 9/15 16. Anxiety/fear of falling- Continue Paxil 20 mg nightly for anxiety since already on Xanax prn  17.  Macrocytic anemia  Severely low vitamin B12, continue supplementation  Hemoglobin 8.4 on 9/15 18.  Elevated creatinine  Creatinine 1.01 on 9/12, labs ordered for tomorrow   LOS: 24 days A FACE TO FACE EVALUATION WAS PERFORMED  Bindu Docter Karis Juba 09/30/2020, 9:58 AM

## 2020-09-30 NOTE — Progress Notes (Signed)
Occupational Therapy Session Note  Patient Details  Name: Diane Willis MRN: [id number] Date of Birth: 1955/11/16  Today's Date: 09/30/2020 OT Individual Time: 6811-5726 OT Individual Time Calculation (min): 70 min    Short Term Goals: Week 4:  OT Short Term Goal 1 (Week 4): STG=LTG 2/2 ELOS (continue working towards supervision/min A LTGs)  Skilled Therapeutic Interventions/Progress Updates:    Pt resting in bed upon arrival. Pt requested to wash at sink and on paper scrubs. Bed mobility with supervision. Sit>stand with CGA and SPT with CGA using RW. Bathing with sit<>stand from w/c with CGA. Pt completed bathing with CGA when standing. Focus on standing balance. Pt with increased anxiety with static standing. Pt does not remove sock so unable to wash feet. Pt used reacher to thread BLE into pants. Pt required min A for pulling pants over hips while standing. Pt max A for donning shoes. Pt transitioned to gym and engaged in Walker activities seated in w/c. Focus on dynamic sitting balance and leaning forward outside BOS. Pt unable to remove contralateral UE from arm rest when reaching to touch BITS board. Pt returned to room and tranfserred to recliner. Pt remained in recliner with all needs within reach and belt alarm activated.   Therapy Documentation Precautions:  Precautions Precautions: Fall Precaution Comments: ataxic, sensation of falling with movement, anxious Restrictions Weight Bearing Restrictions: No General:   Vital Signs:   Pain: Pain Assessment Pain Scale: 0-10 Pain Score: 0-No pain   Therapy/Group: Individual Therapy  Rich Brave 09/30/2020, 10:59 AM

## 2020-10-01 LAB — CBC WITH DIFFERENTIAL/PLATELET
Abs Immature Granulocytes: 0.02 10*3/uL (ref 0.00–0.07)
Basophils Absolute: 0 10*3/uL (ref 0.0–0.1)
Basophils Relative: 1 %
Eosinophils Absolute: 0.3 10*3/uL (ref 0.0–0.5)
Eosinophils Relative: 5 %
HCT: 30.6 % — ABNORMAL LOW (ref 36.0–46.0)
Hemoglobin: 9.8 g/dL — ABNORMAL LOW (ref 12.0–15.0)
Immature Granulocytes: 0 %
Lymphocytes Relative: 28 %
Lymphs Abs: 1.8 10*3/uL (ref 0.7–4.0)
MCH: 33.3 pg (ref 26.0–34.0)
MCHC: 32 g/dL (ref 30.0–36.0)
MCV: 104.1 fL — ABNORMAL HIGH (ref 80.0–100.0)
Monocytes Absolute: 0.7 10*3/uL (ref 0.1–1.0)
Monocytes Relative: 11 %
Neutro Abs: 3.5 10*3/uL (ref 1.7–7.7)
Neutrophils Relative %: 55 %
Platelets: 157 10*3/uL (ref 150–400)
RBC: 2.94 MIL/uL — ABNORMAL LOW (ref 3.87–5.11)
RDW: 13.8 % (ref 11.5–15.5)
WBC: 6.3 10*3/uL (ref 4.0–10.5)
nRBC: 0 % (ref 0.0–0.2)

## 2020-10-01 LAB — BASIC METABOLIC PANEL
Anion gap: 10 (ref 5–15)
BUN: 17 mg/dL (ref 8–23)
CO2: 26 mmol/L (ref 22–32)
Calcium: 9.2 mg/dL (ref 8.9–10.3)
Chloride: 100 mmol/L (ref 98–111)
Creatinine, Ser: 0.98 mg/dL (ref 0.44–1.00)
GFR, Estimated: >60 mL/min (ref >60)
Glucose, Bld: 176 mg/dL — ABNORMAL HIGH (ref 70–99)
Potassium: 4.5 mmol/L (ref 3.5–5.1)
Sodium: 136 mmol/L (ref 135–145)

## 2020-10-01 LAB — GLUCOSE, CAPILLARY
Glucose-Capillary: 111 mg/dL — ABNORMAL HIGH (ref 70–99)
Glucose-Capillary: 174 mg/dL — ABNORMAL HIGH (ref 70–99)
Glucose-Capillary: 125 mg/dL — ABNORMAL HIGH (ref 70–99)
Glucose-Capillary: 363 mg/dL — ABNORMAL HIGH (ref 70–99)
Glucose-Capillary: 131 mg/dL — ABNORMAL HIGH (ref 70–99)

## 2020-10-01 NOTE — Progress Notes (Signed)
PROGRESS NOTE   Subjective/Complaints: Husband at bedside no issues overnite , denies numbness in LEs, feels stronger   ROS: Denies CP, SOB, N/V/D  Objective:   No results found. Recent Labs    10/01/20 0624  WBC 6.3  HGB 9.8*  HCT 30.6*  PLT 157     Recent Labs    10/01/20 0624  NA 136  K 4.5  CL 100  CO2 26  GLUCOSE 176*  BUN 17  CREATININE 0.98  CALCIUM 9.2      Intake/Output Summary (Last 24 hours) at 10/01/2020 1217 Last data filed at 09/30/2020 1918 Gross per 24 hour  Intake 320 ml  Output --  Net 320 ml          Physical Exam: Vital Signs Blood pressure (!) 150/64, pulse 66, temperature 98.3 F (36.8 C), resp. rate 18, height 5\' 1"  (1.549 m), weight 81.9 kg, SpO2 97 %.  General: No acute distress Mood and affect are appropriate Heart: Regular rate and rhythm no rubs murmurs or extra sounds Lungs: Clear to auscultation, breathing unlabored, no rales or wheezes Abdomen: Positive bowel sounds, soft nontender to palpation, nondistended Extremities: No clubbing, cyanosis, or edema Skin: No evidence of breakdown, no evidence of rash   Sensation decreased to light touch in hands and below knees B/L, stable Motor: : RUE- 4/5 proximal to distal LUE- 4/5 proximal to distal RLE- HF 4--4/5, stable LLE- HF 4--4/5     Assessment/Plan: 1. Functional deficits which require 3+ hours per day of interdisciplinary therapy in a comprehensive inpatient rehab setting. Physiatrist is providing close team supervision and 24 hour management of active medical problems listed below. Physiatrist and rehab team continue to assess barriers to discharge/monitor patient progress toward functional and medical goals  Care Tool:  Bathing    Body parts bathed by patient: Right arm, Left arm, Chest, Abdomen, Front perineal area, Buttocks, Right upper leg, Left upper leg, Face   Body parts bathed by helper:  Buttocks Body parts n/a: Right lower leg, Left lower leg   Bathing assist Assist Level: Minimal Assistance - Patient > 75%     Upper Body Dressing/Undressing Upper body dressing   What is the patient wearing?: Pull over shirt    Upper body assist Assist Level: Set up assist    Lower Body Dressing/Undressing Lower body dressing      What is the patient wearing?: Incontinence brief, Pants     Lower body assist Assist for lower body dressing: Minimal Assistance - Patient > 75%     Toileting Toileting    Toileting assist Assist for toileting: Minimal Assistance - Patient > 75%     Transfers Chair/bed transfer  Transfers assist     Chair/bed transfer assist level: Contact Guard/Touching assist Chair/bed transfer assistive device:   Ambulation assist   Ambulation activity did not occur: Safety/medical concerns (Anxiety, BLE weakness, SCI)  Assist level: Contact Guard/Touching assist Assistive device: Walker-rolling Max distance: 200'   Walk 10 feet activity   Assist  Walk 10 feet activity did not occur: Safety/medical concerns (Anxiety, BLE weakness, SCI)  Assist level: Contact Guard/Touching assist Assistive device: Walker-rolling  Walk 50 feet activity   Assist Walk 50 feet with 2 turns activity did not occur: Safety/medical concerns  Assist level: Contact Guard/Touching assist Assistive device: Walker-rolling    Walk 150 feet activity   Assist Walk 150 feet activity did not occur: Safety/medical concerns (Anxiety, BLE weakness, SCI)  Assist level: Contact Guard/Touching assist Assistive device: Walker-rolling    Walk 10 feet on uneven surface  activity   Assist Walk 10 feet on uneven surfaces activity did not occur: Safety/medical concerns (Anxiety, BLE weakness, SCI)         Wheelchair     Assist Is the patient using a wheelchair?: Yes Type of Wheelchair: Manual Wheelchair activity did not  occur: Safety/medical concerns (High anxiety, fear-avoidance behavior)  Wheelchair assist level: Minimal Assistance - Patient > 75% Max wheelchair distance: 150'    Wheelchair 50 feet with 2 turns activity    Assist    Wheelchair 50 feet with 2 turns activity did not occur: Safety/medical concerns (High anxiety, fear-avoidance behavior)   Assist Level: Minimal Assistance - Patient > 75%   Wheelchair 150 feet activity     Assist  Wheelchair 150 feet activity did not occur: Safety/medical concerns (High anxiety, fear-avoidance behavior)   Assist Level: Minimal Assistance - Patient > 75%    Medical Problem List and Plan: 1.  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  Antithrombotics: -DVT/anticoagulation:  Mechanical: Sequential compression devices, below knee Bilateral lower extremities  Dopplers (-) on 8/27 Lovenox              -antiplatelet therapy: ASA             --H/o PE last year 3. Pain Management: Tylenol prn.  4. Mood: LCSW to follow for evaluation and support.              -antipsychotic agents: N/A 5. Neuropsych: This patient is capable of making decisions on his own behalf. 6. Skin/Wound Care: Routine pressure relief measures.  7. Fluids/Electrolytes/Nutrition: Monitor I/Os 8.  T2DM with hyperglycemia: Hgb A1C-7.8--monitor BS ac/hs and use SSI for elevated Bs             Insulin 70/30 decreased to 20 daily on 9/15, decreased to 18 on 9/17, decreased to 15 on 9/19  Insulin 70/30 decreased to 10 nightly on 9/14, increased to 15 on 9/15   CBG (last 3)  Recent Labs    10/01/20 0211 10/01/20 0614 10/01/20 1201  GLUCAP 111* 174* 125*    Improving 9/19 9. Vitamin B12: B12< 50.               --Continue B12 1000 mcg daily X 7 days-->weekly for 7 weeks then monthly.  --lifelong B12 1000 mcg monthly supplement after loading doses.  10. Dorsal column syndrome:  due to VitB12  deficiency 11. Thrombocytopenia: Resolved 11. Vitamin B1 deficiency: Thiamine 53.1--continue supplement.  12. Anxiety d/o: Continue Xanax prn.  13. 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  Relatively controlled on 9/18, monitor for trend 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  Continue bowel program 15.  Spasticity-  Increased baclofen to 10 mg TID. For extensor tone.    Stable on 9/15 16. Anxiety/fear of falling- Continue Paxil 20 mg nightly for anxiety since already on Xanax prn  17.  Macrocytic anemia  Severely low vitamin B12, continue supplementation Hemoglobin 8.4 on 9/15, improved to 9.8 on 9/19 18.  Creatinine now normal at 0.98 on 9/19   LOS: 25 days A FACE TO FACE EVALUATION WAS PERFORMED  Erick Colace 10/01/2020, 12:17 PM

## 2020-10-01 NOTE — Progress Notes (Signed)
Occupational Therapy Session Note  Patient Details  Name: Diane Willis MRN: [id number] Date of Birth: July 11, 1955  Today's Date: 10/01/2020 OT Individual Time: 7035-0093 OT Individual Time Calculation (min): 70 min    Short Term Goals: Week 4:  OT Short Term Goal 1 (Week 4): STG=LTG 2/2 ELOS (continue working towards supervision/min A LTGs)  Skilled Therapeutic Interventions/Progress Updates:    OT intervention with focus on bed mobility, sit<>stand, standing balance, sitting balance, bathing/dressing w/c level at sink, and safety awareness to increase indepdnence with BADLs and prepare for discharge. Bed mobility with supervision. Sit<>stand and transfer to w/c (using RW) with CGA. CGA for standing at sink for LB bathing tasks. See Care Tool for assist levels. Pt engaged in standing activity tossing bean bags in game of [employer]. CGA for standing balance. Pt continues to exhibit increased anxiety with minor weight shifts in standing. Pt also engaged in dynamic sitting activity at [facility] with emphasis on forward leans and reaching across midline. Pt continues to keep one UE on w/c at all times when sitting in w/c. Pt returned to room and amb with RW approx 5' to recliner. Pt remained in recliner with all needs within reach and seat alarm activated.   Therapy Documentation Precautions:  Precautions Precautions: Fall Precaution Comments: ataxic, sensation of falling with movement, anxious Restrictions Weight Bearing Restrictions: No Pain: Pain Assessment Pain Scale: 0-10 Pain Score: 0-No pain   Therapy/Group: Individual Therapy  Rich Brave 10/01/2020, 9:32 AM

## 2020-10-01 NOTE — Progress Notes (Signed)
Patient ID: Diane Willis, female   DOB: 1955-04-12, 65 y.o.   MRN: [id number]  SW met with pt and pt husband in room to discuss making contact with [employer] for DME co-pays, and SW ordered 3in1 BSC. SW informed on [facility] for HHPT/OT. SW confirms RW delivered.   Loralee Pacas, MSW, [place] Office: [phone] Cell: [phone] Fax: [phone]

## 2020-10-01 NOTE — Progress Notes (Signed)
Physical Therapy Session Note  Patient Details  Name: Diane Willis MRN: [id number] Date of Birth: January 06, 1956  Today's Date: 10/01/2020 PT Individual Time: 1100-1155; 1345-1500 PT Individual Time Calculation (min): 55 min and 75 min  Short Term Goals: Week 4:  PT Short Term Goal 1 (Week 4): =LTG due to ELOS  Skilled Therapeutic Interventions/Progress Updates:    Session 1: Pt received seated in recliner in room, agreeable to PT session. No complaints of pain. Sit to stand and stand pivot transfers with RW and CGA throughout session. Pt does require increased time and cues for anterior weight shift due to onset of extensor tone with standing and pushing up against chair with BLE in standing. Ambulation x 200 ft with RW and CGA for balance, cues for upright posture. Pt exhibits decreased overall gait speed but increased tolerance for gait with increased distance completed this date. Sit to supine on flat mat table initially with mod A needed for BLE management, progression to Supervision for sit to supine. Use of RW as bedrail for decreased anxiety with bed mobility. Pt anxious about being to close to edge of mat table, able to scoot herself L/R on mat table with assist needed for WBing through BLE due to extensor tone in hook-lying position. Pt able to return to sitting with Supervision. Per pt and husband report her bed at home has adjustable head and feet and husband can remove blocks from under bed to decrease height. Also recommending pt purchase bedrail or strap to attach to headboard to allow her to have device to pull herself back up to sitting. Seated balance EOM with min A performing ball toss, use of 3# ankle weights around BLE and manual assist for BLE weight bearing due to onset of BLE and truncal extensor tone in seated position. Pt returned to recliner at end of session, quick release belt in place.  Session 2: Pt received seated in recliner in room, agreeable to PT session. No  complaints of pain. Sit to stand and stand pivot transfer with RW and CGA to min A throughout session. Ambulation x 25 ft with RW and CGA for balance. Pt has onset of extensor tone when going to sit in w/c and needs max A to maintain standing balance, cues for diaphragmatic breathing for anxiety management. Pt able to recover balance and sit safely in w/c. Ascend/descend 3 x 6" stairs with R handrail and CGA for balance, x 2 reps, using RW to transfer to/from w/c. Pt exhibits improvement in anxiety with stair management this date. Car transfer with RW and min A at simulation height of SUV pt will transfer home in, cues for safety. BITS with focus on seated balance and reaching outside BOS and across midline with alt UE performing visual scanning and complex array task. Pt scores 92.86%, 89.66%, and 92.86% accuracy each round. Pt requests to use the bathroom, needs encouragement to perform transfer with RW and not use stedy. Reminded patient that she will not have a stedy at home, pt agreeable to use RW for transfer. Pt is min A for toilet transfer with use of RW, assist needed for clothing management and pericare for thoroughness. Pt returned to recliner at end of session, left seated with needs in reach and quick release belt in place.  Therapy Documentation Precautions:  Precautions Precautions: Fall Precaution Comments: ataxic, sensation of falling with movement, anxious Restrictions Weight Bearing Restrictions: No     Therapy/Group: Individual Therapy   Peter Congo, PT, DPT, CSRS  10/01/2020, 12:13 PM

## 2020-10-02 LAB — GLUCOSE, CAPILLARY
Glucose-Capillary: 34 mg/dL — CL (ref 70–99)
Glucose-Capillary: 157 mg/dL — ABNORMAL HIGH (ref 70–99)
Glucose-Capillary: 136 mg/dL — ABNORMAL HIGH (ref 70–99)
Glucose-Capillary: 206 mg/dL — ABNORMAL HIGH (ref 70–99)
Glucose-Capillary: 199 mg/dL — ABNORMAL HIGH (ref 70–99)

## 2020-10-02 NOTE — Progress Notes (Signed)
Physical Therapy Discharge Summary  Patient Details  Name: Diane Willis MRN: [id number] Date of Birth: 11/23/1955  Patient has met 7 of 7 long term goals due to improved activity tolerance, improved balance, improved postural control, increased strength, and ability to compensate for deficits.  Patient to discharge at an ambulatory level Supervision.   Patient's care partner is independent to provide the necessary physical assistance at discharge.  Reasons goals not met: Patient has met all rehab goals.  Recommendation:  Patient will benefit from ongoing skilled PT services in home health setting to continue to advance safe functional mobility, address ongoing impairments in endurance, strength, safety, balance, independence with functional mobility, extensor tone management, and minimize fall risk.  Equipment: Youth RW  Reasons for discharge: treatment goals met and discharge from hospital  Patient/family agrees with progress made and goals achieved: Yes  PT Discharge Precautions/Restrictions Precautions Precautions: Fall Restrictions Weight Bearing Restrictions: No Pain Pain Assessment Pain Scale: 0-10 Pain Score: 0-No pain Pain Interference Pain Interference Pain Effect on Sleep: 0. Does not apply - I have not had any pain or hurting in the past 5 days Pain Interference with Therapy Activities: 0. Does not apply - I have not received rehabilitationtherapy in the past 5 days Pain Interference with Day-to-Day Activities: 1. Rarely or not at all Vision/Perception  Vision - History Baseline Vision: Wears glasses only for reading Patient Visual Report: No change from baseline Perception Perception: Within Functional Limits Praxis Praxis: Intact  Cognition Overall Cognitive Status: Within Functional Limits for tasks assessed Arousal/Alertness: Awake/alert Orientation Level: Oriented X4 Year: 2022 Month: September Attention: Focused;Sustained Focused Attention:  Appears intact Sustained Attention: Appears intact Memory: Appears intact Awareness: Appears intact Problem Solving: Appears intact Behaviors: Other (comment) (anxious) Safety/Judgment: Appears intact Comments: anxiety can impact safety during transfers at times Sensation Sensation Light Touch: Impaired Detail Light Touch Impaired Details: Impaired RLE;Impaired LLE (decreased sensation, N/T that has improved since eval) Proprioception: Impaired Detail Proprioception Impaired Details: Impaired RLE;Impaired LLE Coordination Gross Motor Movements are Fluid and Coordinated: No Fine Motor Movements are Fluid and Coordinated: Yes Coordination and Movement Description: impaired 2/2 ongoing extensor tone in trunk and BLE that increases with anxiety Heel Shin Test: impaired R>L Motor  Motor Motor: Abnormal tone;Abnormal postural alignment and control Motor - Skilled Clinical Observations: posterior lean, extensor tone in BLE and trunk Motor - Discharge Observations: posterior lean, extensor tone in BLE and trunk  Mobility Bed Mobility Bed Mobility: Rolling Right;Rolling Left;Supine to Sit;Sit to Supine Rolling Right: Independent with assistive device Sit to Supine: Independent with assistive device Transfers Transfers: Sit to Stand;Stand Pivot Transfers Sit to Stand: Supervision/Verbal cueing Stand Pivot Transfers: Supervision/Verbal cueing Stand Pivot Transfer Details: Verbal cues for safe use of DME/AE;Verbal cues for sequencing;Verbal cues for precautions/safety Transfer (Assistive device): Rolling walker Locomotion  Gait Ambulation: Yes Gait Assistance: Supervision/Verbal cueing Gait Distance (Feet): 200 Feet Assistive device: Rolling walker Gait Assistance Details: Verbal cues for gait pattern;Verbal cues for safe use of DME/AE Gait Gait: Yes Gait Pattern: Impaired Gait Pattern: Decreased step length - right;Decreased step length - left;Decreased hip/knee flexion -  right;Decreased hip/knee flexion - left;Decreased dorsiflexion - right;Decreased dorsiflexion - left;Decreased weight shift to right Gait velocity: decreased Stairs / Additional Locomotion Stairs: Yes Stairs Assistance: Contact Guard/Touching assist Stair Management Technique: One rail Right;Sideways Number of Stairs: 6 Height of Stairs: 3 Wheelchair Mobility Wheelchair Mobility: No  Trunk/Postural Assessment  Cervical Assessment Cervical Assessment: Exceptions to [facility] (forward head) Thoracic Assessment Thoracic Assessment: Exceptions to  WFL (rounded shoulders) Lumbar Assessment Lumbar Assessment: Exceptions to [facility] (posterior pelvic tilt) Postural Control Postural Control: Deficits on evaluation Trunk Control: posterior lean, improves with cues Righting Reactions: impaired, improved since eval Protective Responses: impaired, improved since eval  Balance Balance Balance Assessed: Yes Static Sitting Balance Static Sitting - Balance Support: No upper extremity supported;Feet supported Static Sitting - Level of Assistance: 5: Stand by assistance Dynamic Sitting Balance Dynamic Sitting - Balance Support: No upper extremity supported;Feet supported;During functional activity Dynamic Sitting - Level of Assistance: 5: Stand by assistance Static Standing Balance Static Standing - Balance Support: Bilateral upper extremity supported;During functional activity Static Standing - Level of Assistance: 5: Stand by assistance Dynamic Standing Balance Dynamic Standing - Balance Support: Bilateral upper extremity supported;During functional activity Dynamic Standing - Level of Assistance: 5: Stand by assistance Extremity Assessment  RUE Assessment RUE Assessment: Within Functional Limits LUE Assessment LUE Assessment: Within Functional Limits RLE Assessment RLE Assessment: Exceptions to [facility] General Strength Comments: improved since eval, see below RLE Strength Right Hip Flexion:  4+/5 Right Knee Flexion: 4/5 Right Knee Extension: 5/5 Right Ankle Dorsiflexion: 3/5 LLE Assessment LLE Assessment: Exceptions to [facility] General Strength Comments: improved since eval, see below LLE Strength Left Hip Flexion: 5/5 Left Knee Flexion: 5/5 Left Knee Extension: 5/5 Left Ankle Dorsiflexion: 4/5     Excell Seltzer, PT, DPT, CSRS 10/02/2020, 10:58 AM

## 2020-10-02 NOTE — Progress Notes (Signed)
Physical Therapy Session Note  Patient Details  Name: Diane Willis MRN: [id number] Date of Birth: 02-27-1955  Today's Date: 10/02/2020 PT Individual Time: 1000-1055 PT Individual Time Calculation (min): 55 min   Short Term Goals: Week 4:  PT Short Term Goal 1 (Week 4): =LTG due to ELOS  Skilled Therapeutic Interventions/Progress Updates:    Pt received seated in recliner in room, agreeable to PT session. No complaints of pain. Sit to stand with Supervision and RW throughout session, cues for anterior weight shift. Ambulatory transfer into bathroom with RW and Supervision. Toilet transfer with Supervision, some assist needed for brief management, setup A for pericare. Ambulation x 200 ft with RW and Supervision, pt exhibits decreased BLE hip flexion, knee flexion, and DF during gait. Pt requires cues for safety when turning with RW due to onset of anxiety and extensor tone/posterior leaning. Pt left seated in recliner in room with needs in reach, quick release belt in place at end of session.  Therapy Documentation Precautions:  Precautions Precautions: Fall Precaution Comments: ataxic, sensation of falling with movement, anxious Restrictions Weight Bearing Restrictions: No    Therapy/Group: Individual Therapy   Peter Congo, PT, DPT, CSRS  10/02/2020, 12:13 PM

## 2020-10-02 NOTE — Progress Notes (Signed)
PROGRESS NOTE   Subjective/Complaints: Patient seen sitting up in her chair this morning.  She states she slept well overnight.  She is looking forward to discharge tomorrow.  ROS: Denies CP, SOB, N/V/D  Objective:   No results found. Recent Labs    10/01/20 0624  WBC 6.3  HGB 9.8*  HCT 30.6*  PLT 157     Recent Labs    10/01/20 0624  NA 136  K 4.5  CL 100  CO2 26  GLUCOSE 176*  BUN 17  CREATININE 0.98  CALCIUM 9.2      Intake/Output Summary (Last 24 hours) at 10/02/2020 0955 Last data filed at 10/02/2020 0859 Gross per 24 hour  Intake 360 ml  Output --  Net 360 ml          Physical Exam: Vital Signs Blood pressure (!) 147/65, pulse 67, temperature 98.2 F (36.8 C), temperature source Oral, resp. rate 18, height 5\' 1"  (1.549 m), weight 81.9 kg, SpO2 99 %. Constitutional: No distress . Vital signs reviewed. HENT: Normocephalic.  Atraumatic. Eyes: EOMI. No discharge. Cardiovascular: No JVD.  RRR. Respiratory: Normal effort.  No stridor.  Bilateral clear to auscultation. GI: Non-distended.  BS +. Skin: Warm and dry.  Intact. Psych: Normal mood.  Normal behavior. Musc: No edema in extremities.  No tenderness in extremities. Neuro: Alert and oriented Sensation decreased to light touch in hands and below knees B/L, stable Motor: : RUE- 4/5 proximal to distal LUE- 4/5 proximal to distal RLE- HF 4--4/5, unchanged LLE- HF 4--4/5     Assessment/Plan: 1. Functional deficits which require 3+ hours per day of interdisciplinary therapy in a comprehensive inpatient rehab setting. Physiatrist is providing close team supervision and 24 hour management of active medical problems listed below. Physiatrist and rehab team continue to assess barriers to discharge/monitor patient progress toward functional and medical goals  Care Tool:  Bathing    Body parts bathed by patient: Right arm, Left arm, Chest,  Abdomen, Front perineal area, Buttocks, Right upper leg, Left upper leg, Face   Body parts bathed by helper: Buttocks Body parts n/a: Right lower leg, Left lower leg   Bathing assist Assist Level: Minimal Assistance - Patient > 75%     Upper Body Dressing/Undressing Upper body dressing   What is the patient wearing?: Pull over shirt    Upper body assist Assist Level: Independent    Lower Body Dressing/Undressing Lower body dressing      What is the patient wearing?: Incontinence brief, Pants     Lower body assist Assist for lower body dressing: Minimal Assistance - Patient > 75%     Toileting Toileting    Toileting assist Assist for toileting: Minimal Assistance - Patient > 75%     Transfers Chair/bed transfer  Transfers assist     Chair/bed transfer assist level: Contact Guard/Touching assist Chair/bed transfer assistive device:   Ambulation assist   Ambulation activity did not occur: Safety/medical concerns (Anxiety, BLE weakness, SCI)  Assist level: Contact Guard/Touching assist Assistive device: Walker-rolling Max distance: 200'   Walk 10 feet activity   Assist  Walk 10 feet activity did not occur:  Safety/medical concerns (Anxiety, BLE weakness, SCI)  Assist level: Contact Guard/Touching assist Assistive device: Walker-rolling   Walk 50 feet activity   Assist Walk 50 feet with 2 turns activity did not occur: Safety/medical concerns  Assist level: Contact Guard/Touching assist Assistive device: Walker-rolling    Walk 150 feet activity   Assist Walk 150 feet activity did not occur: Safety/medical concerns (Anxiety, BLE weakness, SCI)  Assist level: Contact Guard/Touching assist Assistive device: Walker-rolling    Walk 10 feet on uneven surface  activity   Assist Walk 10 feet on uneven surfaces activity did not occur: Safety/medical concerns (Anxiety, BLE weakness, SCI)          Wheelchair     Assist Is the patient using a wheelchair?: Yes Type of Wheelchair: Manual Wheelchair activity did not occur: Safety/medical concerns (High anxiety, fear-avoidance behavior)  Wheelchair assist level: Minimal Assistance - Patient > 75% Max wheelchair distance: 150'    Wheelchair 50 feet with 2 turns activity    Assist    Wheelchair 50 feet with 2 turns activity did not occur: Safety/medical concerns (High anxiety, fear-avoidance behavior)   Assist Level: Minimal Assistance - Patient > 75%   Wheelchair 150 feet activity     Assist  Wheelchair 150 feet activity did not occur: Safety/medical concerns (High anxiety, fear-avoidance behavior)   Assist Level: Minimal Assistance - Patient > 75%    Medical Problem List and Plan: 1.  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 Please see conference note from today as well.  2.  Antithrombotics: -DVT/anticoagulation:  Mechanical: Sequential compression devices, below knee Bilateral lower extremities  Dopplers (-) on 8/27 Lovenox              -antiplatelet therapy: ASA             --H/o PE last year 3. Pain Management: Tylenol prn.  4. Mood: LCSW to follow for evaluation and support.              -antipsychotic agents: N/A 5. Neuropsych: This patient is capable of making decisions on his own behalf. 6. Skin/Wound Care: Routine pressure relief measures.  7. Fluids/Electrolytes/Nutrition: Monitor I/Os 8.  T2DM with hyperglycemia: Hgb A1C-7.8--monitor BS ac/hs and use SSI for elevated Bs             Insulin 70/30 decreased to 20 daily on 9/15, decreased to 18 on 9/17, decreased to 15 on 9/19  Insulin 70/30 decreased to 10 nightly on  9/14, increased to 15 on 9/15   CBG (last 3)  Recent Labs    10/01/20 1704 10/01/20 2122 10/02/20 0636  GLUCAP 363* 131* 157*    Remains labile, but stabilizing on 9/20 9. Vitamin B12: B12< 50.               --Continue B12 1000 mcg daily X 7 days-->weekly for 7 weeks then monthly.  --lifelong B12 1000 mcg monthly supplement after loading doses.  10. 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 Thrombocytopenia: Resolved 11. Vitamin B1 deficiency: Thiamine 53.1--continue supplement.  12. Anxiety d/o: Continue Xanax prn.  13. 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  Borderline on 9/20 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  Continue bowel program 15.  Spasticity-  Increased baclofen to 10 mg TID. For extensor tone.    Stable on 9/15 16. Anxiety/fear of falling- Continue Paxil 20 mg nightly for anxiety since already on Xanax prn  17.  Macrocytic anemia  Severely low vitamin B12, continue supplementation Hemoglobin 9.8 on 9/19 18.  Creatinine now normal at 0.98 on 9/19   LOS: 26 days A FACE TO FACE EVALUATION WAS PERFORMED  Owyn Raulston Karis Juba 10/02/2020, 9:55 AM

## 2020-10-02 NOTE — Progress Notes (Signed)
Occupational Therapy Session Note  Patient Details  Name: VAUNDA GUTTERMAN MRN: [id number] Date of Birth: 05/06/1955  Today's Date: 10/02/2020 OT Individual Time: 1130-1155 OT Individual Time Calculation (min): 25 min    Short Term Goals: Week 4:  OT Short Term Goal 1 (Week 4): STG=LTG 2/2 ELOS (continue working towards supervision/min A LTGs)  Skilled Therapeutic Interventions/Progress Updates:    Pt resting in recliner upon arrival and agreeable to therapy. Pt agreeable to using reacher to retrieve wash cloth from floor while standing with RW-CGA. Sit<>standX5 with CGA. Discussed taking shower at home and practicing tranfsers prior to taking a shower. Pt pleased with progress and ready for discharge home tomorrow. Pt remained in recliner with all needs withn reach and belt alarm activated.   Therapy Documentation Precautions:  Precautions Precautions: Fall Precaution Comments: ataxic, sensation of falling with movement, anxious Restrictions Weight Bearing Restrictions: No  Pain:  Pt denies pain this morning   Therapy/Group: Individual Therapy  Rich Brave 10/02/2020, 12:00 PM

## 2020-10-02 NOTE — Progress Notes (Signed)
Occupational Therapy Session Note  Patient Details  Name: Diane Willis MRN: [id number] Date of Birth: 1955-06-18  Today's Date: 10/02/2020 OT Individual Time: 1345-1425 OT Individual Time Calculation (min): 40 min    Short Term Goals: Week 2:  OT Short Term Goal 1 (Week 2): Pt will don shirt with S OT Short Term Goal 1 - Progress (Week 2): Met OT Short Term Goal 2 (Week 2): Pt will sit to stand iwht LRAD and MAX A of 1 in prep for BADL OT Short Term Goal 2 - Progress (Week 2): Met OT Short Term Goal 3 (Week 2): Pt will complete LB bathing with mod A OT Short Term Goal 3 - Progress (Week 2): Met OT Short Term Goal 4 (Week 2): Pt will complete LB dressing with mod A OT Short Term Goal 4 - Progress (Week 2): Met Week 3:  OT Short Term Goal 1 (Week 3): Pt will perform sit<>stand with mod A using RW in preparation for pulling pants over hips OT Short Term Goal 1 - Progress (Week 3): Met OT Short Term Goal 2 (Week 3): Pt will complete LB bathing with min A seated EOB using lateral leans OT Short Term Goal 2 - Progress (Week 3): Met OT Short Term Goal 3 (Week 3): Pt will complete toileting with mod A OT Short Term Goal 3 - Progress (Week 3): Met Week 4:  OT Short Term Goal 1 (Week 4): STG=LTG 2/2 ELOS (continue working towards supervision/min A LTGs)  Skilled Therapeutic Interventions/Progress Updates:    Pt greeted at time of session sitting up in recliner agreeable to OT session, no pain reported. Aware of grad day and saying she did "a lot" with other therapies today. Needing to toilet, stand pivot recliner > w/c > toilet with close supervision with RW. 3/3 toileting tasks with Min assist to manage pants over buttocks fully in the back only. In gym, pt performing walk in shower transfer x3 trials each with CGA/close supervision and cues for sequencing. Pt feeling good about this and no further questions. Transport back to room and stand pivot wheelchair > recliner with RW. Husband in room  to visit with patient. Alarm on call bell in reach.   Therapy Documentation Precautions:  Precautions Precautions: Fall Precaution Comments: ataxic, sensation of falling with movement, anxious Restrictions Weight Bearing Restrictions: No     Therapy/Group: Individual Therapy  Viona Gilmore 10/02/2020, 1:59 PM

## 2020-10-02 NOTE — Progress Notes (Signed)
Patient ID: Diane Willis, female   DOB: [birth year], 65 y.o.   MRN: [id number]  SW met with pt in room to provide updates from team conference, and d/c date remains 9/21. Pt has no questions/concerns about discharge.   Loralee Pacas, MSW, [place] Office: [phone] Cell: [phone] Fax: [phone]

## 2020-10-02 NOTE — Progress Notes (Signed)
Inpatient Rehabilitation Care Coordinator Discharge Note   Patient Details  Name: Diane Willis MRN: [id number] Date of Birth: [birth year]   Discharge location: D/c to home with 24/7 care from [facility] during the day, and pt husband home in the evening.  Length of Stay: 26 days  Discharge activity level: Supervision  Home/community participation: Limited  Patient response QQ:IWLNLG Literacy - How often do you need to have someone help you when you read instructions, pamphlets, or other written material from your doctor or pharmacy?: Never  Patient response XQ:JJHERD Isolation - How often do you feel lonely or isolated from those around you?: Never  Services provided included: MD, PT, RD, OT, RN, CM, TR, Neuropsych, SW, Pharmacy  Financial Services:  Field seismologist Utilized: Barrister's clerk  Choices offered to/list presented to: Yes  Follow-up services arranged:  Home Health, DME Home Health Agency: [facility] for HHPT/OT    DME : [facility] for [facility] and youth RW  Patient response to transportation need: Is the patient able to respond to transportation needs?: No In the past 12 months, has lack of transportation kept you from medical appointments or from getting medications?: No In the past 12 months, has lack of transportation kept you from meetings, work, or from getting things needed for daily living?: No  Comments (or additional information):  Patient/Family verbalized understanding of follow-up arrangements:  Yes  Individual responsible for coordination of the follow-up plan: Contact pt # [phone] or pt husband Diane Willis [phone]  Confirmed correct DME delivered: Gretchen Short 10/02/2020    Gretchen Short

## 2020-10-02 NOTE — Progress Notes (Signed)
Occupational Therapy Session Note  Patient Details  Name: Diane Willis MRN: [id number] Date of Birth: [birth year]  Today's Date: 10/02/2020 OT Individual Time: 5885-0277 OT Individual Time Calculation (min): 55 min    Short Term Goals: Week 4:  OT Short Term Goal 1 (Week 4): STG=LTG 2/2 ELOS (continue working towards supervision/min A LTGs)  Skilled Therapeutic Interventions/Progress Updates:    OT intervention with focus on bed mobility, sit<>stand, standing balance, functional tranfsers, bathing/dressing w/c level at sink, functional amb with RW, and safety awareness to prepare for discharge home tomorrow. Supine>sit EOB with supervision. Sit<>stand and SPT with CGA. Bathing and LB dressing with min A. Standing balance with CGA. Pt continues to exhibit increased anxiety with positional changes. Amb with RW with CGA to recliner. Pt requires more then a reasonable amount of time to complete tasks. Pt remained in recliner with all needs within reach and belt alarm activated.   Therapy Documentation Precautions:  Precautions Precautions: Fall Precaution Comments: ataxic, sensation of falling with movement, anxious Restrictions Weight Bearing Restrictions: No Pain: Pain Assessment Pain Scale: 0-10 Pain Score: 0-No pain   Therapy/Group: Individual Therapy  Rich Brave 10/02/2020, 8:55 AM

## 2020-10-02 NOTE — Patient Care Conference (Signed)
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 Maryla Morrow Social Worker Present: Cecile Sheerer, LCSWA Nurse Present: Kennyth Arnold, RN PT Present: Peter Congo, PT OT Present: Ardis Rowan, COTA;Jennifer Katrinka Blazing, OT PPS Coordinator present : Fae Pippin, SLP     Current Status/Progress Goal Weekly Team Focus  Bowel/Bladder   to conitinent to b/b with some episodes of incontinence  pt to have no incontinence episodes  time toileting schedule   Swallow/Nutrition/ Hydration             ADL's   bathing w/c level at sink-min A: LB dressing-min A: UB dressing-supervision; toileting-min A/GA; functional transfers-CGA  min A to supervision overall  discharge planning, education, activity tolerance   Mobility   Supervision bed mobility, CGA transfers with RW, gait up to 200 ft with RW CGA, CGA to min A stairs with one handrail laterlaly  upgraded goals to Supervision overall, min A stairs  family edu, d/c planning, gait, stairs   Communication             Safety/Cognition/ Behavioral Observations  pt is a one assist with a steady,  aware of safety         Pain   denies pain  to remain pain free  assess pain q 4 hr and prn   Skin   skin is intact  skin to remain intact  assess skin q shift and prn     Discharge Planning:  D/c to home with pt husband who will assist with care when he is not working, and his mother will be with patient primarily during the day.Fam edu completed on 9/8 and 9/9 with pt husband and MIL.   Team Discussion: CBG's improving, still up and down, will not make additional changes. Continent/incontinent bladder, LBM 9/19. No pain reported, skin is CDI. Nursing educating on diabetes, bladder management, and medication management. Patient managed her diabetes PTA and insulin injections. Current barrier is mobility. Equipment delivered to room. Walking over 200 ft, ready for discharge. At goal level with OT. Patient on target to meet rehab goals: yes  *See Care Plan and progress notes for long and short-term goals.   Revisions to Treatment Plan:  None at this time.  Teaching Needs: All education with family complete. Patient ready for discharge.  Current Barriers to Discharge: Decreased caregiver support, Medical stability, Home enviroment access/layout, Incontinence, Lack of/limited family support, Weight, and Medication compliance  Possible Resolutions to Barriers: Continue current medications, provide incontinence management education.     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 due to  Vit B12 deficiency  Barriers to Discharge: Decreased family/caregiver support;Home enviroment access/layout;Incontinence;Neurogenic Bowel & Bladder;Medical stability;Weight   Possible Resolutions to [employer] Focus: Therapies, follow CBGs/BP, pt and family edu   Continued Need for Acute Rehabilitation Level of Care: The patient requires daily medical management by a physician with specialized training in physical medicine and  rehabilitation for the following reasons: Direction of a multidisciplinary physical rehabilitation program to maximize functional independence : Yes Medical management of patient stability for increased activity during participation in an intensive rehabilitation regime.: Yes Analysis of laboratory values and/or radiology reports with any subsequent need for medication adjustment and/or medical intervention. : Yes   I attest that I was present, lead the team conference, and concur with the assessment and plan of the team.   Tennis Must 10/02/2020, 3:59 PM

## 2020-10-02 NOTE — Progress Notes (Signed)
Occupational Therapy Discharge Summary  Patient Details  Name: Diane Willis MRN: [id number] Date of Birth: 1955-09-12  Patient has met 2 of 11 long term goals due to improved activity tolerance, improved balance, postural control, ability to compensate for deficits, improved awareness, and improved coordination.  Pt made steady progress with BADLs and functional transfers during this admission. Pt requires min A for bathing tasks and LB dressing tasks. Pt requires min A for toileting tasks. Functional transfers including toilet and walk in shower with CGA. Pt's husband has participated in therapy and provides the appropriate level of assistance/supervision. Patient to discharge at overall Supervision - min A level.  Patient's care partner is independent to provide the necessary physical assistance at discharge.    Recommendation:  Patient will benefit from ongoing skilled OT services in home health setting to continue to advance functional skills in the area of BADL and Reduce care partner burden.  Equipment: BSC  Reasons for discharge: treatment goals met and discharge from hospital  Patient/family agrees with progress made and goals achieved: Yes  OT Discharge Vision Baseline Vision/History: 1 Wears glasses Patient Visual Report: No change from baseline Vision Assessment?: No apparent visual deficits Perception  Perception: Within Functional Limits Praxis Praxis: Intact Cognition Overall Cognitive Status: Within Functional Limits for tasks assessed Arousal/Alertness: Awake/alert Orientation Level: Oriented X4 Immediate Memory Recall: Sock;Blue;Bed Memory Recall Sock: Without Cue Memory Recall Blue: Without Cue Memory Recall Bed: Without Cue Awareness: Appears intact Problem Solving: Appears intact Safety/Judgment: Appears intact Sensation Sensation Light Touch: Impaired by gross assessment (BLE) Hot/Cold: Appears Intact Proprioception: Appears Intact Stereognosis: Not  tested Coordination Gross Motor Movements are Fluid and Coordinated: Yes Fine Motor Movements are Fluid and Coordinated: Yes Coordination and Movement Description: fear-avoidance behavior and high anxiety, very slow movement Motor  Motor Motor: Abnormal postural alignment and control;Paraplegia Trunk/Postural Assessment  Cervical Assessment Cervical Assessment: Exceptions to [facility] (forward head) Thoracic Assessment Thoracic Assessment: Exceptions to [facility] (rounded shoulders) Lumbar Assessment Lumbar Assessment: Exceptions to [facility] (posterior pelvic tilt)  Balance Static Sitting Balance Static Sitting - Balance Support: Feet supported;Bilateral upper extremity supported Static Sitting - Level of Assistance: 5: Stand by assistance Dynamic Sitting Balance Dynamic Sitting - Balance Support: During functional activity Dynamic Sitting - Level of Assistance: 5: Stand by assistance Extremity/Trunk Assessment RUE Assessment RUE Assessment: Within Functional Limits LUE Assessment LUE Assessment: Within Functional Limits   [name] 10/02/2020, 7:01 AM

## 2020-10-03 ENCOUNTER — Other Ambulatory Visit (HOSPITAL_COMMUNITY): Payer: Self-pay

## 2020-10-03 LAB — GLUCOSE, CAPILLARY
Glucose-Capillary: 297 mg/dL — ABNORMAL HIGH (ref 70–99)
Glucose-Capillary: 169 mg/dL — ABNORMAL HIGH (ref 70–99)

## 2020-10-03 MED ORDER — THIAMINE HCL 100 MG PO TABS
100.0000 mg | ORAL_TABLET | Freq: Every day | ORAL | 0 refills | Status: DC
Start: 2020-10-03 — End: 2021-09-27
  Filled 2020-10-03: qty 30, 30d supply, fill #0

## 2020-10-03 MED ORDER — BISACODYL 10 MG RE SUPP
10.0000 mg | Freq: Every day | RECTAL | 0 refills | Status: AC
  Filled 2020-10-03: qty 12, 12d supply, fill #0

## 2020-10-03 MED ORDER — VITAMIN D (ERGOCALCIFEROL) 1.25 MG (50000 UNIT) PO CAPS
50000.0000 [IU] | ORAL_CAPSULE | ORAL | 0 refills | Status: AC
  Filled 2020-10-03: qty 4, 28d supply, fill #0

## 2020-10-03 MED ORDER — CYANOCOBALAMIN 1000 MCG/ML IJ SOLN: 1000.0000 ug | INTRAMUSCULAR | 0 refills | Status: AC

## 2020-10-03 MED ORDER — ENOXAPARIN SODIUM 40 MG/0.4ML IJ SOSY
40.0000 mg | PREFILLED_SYRINGE | Freq: Every day | INTRAMUSCULAR | 0 refills | Status: DC
  Filled 2020-10-03: qty 12, 30d supply, fill #0

## 2020-10-03 MED ORDER — BACLOFEN 10 MG PO TABS
10.0000 mg | ORAL_TABLET | Freq: Three times a day (TID) | ORAL | 0 refills | Status: DC
  Filled 2020-10-03: qty 90, 30d supply, fill #0

## 2020-10-03 MED ORDER — PAROXETINE HCL 20 MG PO TABS
20.0000 mg | ORAL_TABLET | Freq: Every day | ORAL | 0 refills | Status: DC
  Filled 2020-10-03: qty 30, 30d supply, fill #0

## 2020-10-03 MED ORDER — DOCUSATE SODIUM 100 MG PO CAPS
100.0000 mg | ORAL_CAPSULE | Freq: Two times a day (BID) | ORAL | 0 refills | Status: DC | PRN
  Filled 2020-10-03: qty 60, 30d supply, fill #0

## 2020-10-03 MED ORDER — INSULIN ASPART PROT & ASPART (70-30 MIX) 100 UNIT/ML ~~LOC~~ SUSP: 15.0000 [IU] | Freq: Two times a day (BID) | SUBCUTANEOUS | 11 refills | Status: AC

## 2020-10-03 MED ORDER — ENOXAPARIN SODIUM 40 MG/0.4ML IJ SOSY: 40.0000 mg | PREFILLED_SYRINGE | Freq: Every day | INTRAMUSCULAR | 0 refills | Status: DC

## 2020-10-03 NOTE — Discharge Summary (Addendum)
Physician Discharge Summary  Patient ID: Diane Willis MRN: [id number] DOB/AGE: 1955-06-19 65 y.o.  Admit date: 09/06/2020 Discharge date: 10/03/2020  Discharge Diagnoses:  Principal Problem:   Subacute combined degeneration of spinal cord [facility]) Active Problems:   Controlled type 2 diabetes mellitus with hyperglycemia (HCC)   Anxiety about health   Vitamin B12 deficiency   Macrocytic anemia   Elevated serum creatinine   Thiamine deficiency   Discharged Condition: good  Significant Diagnostic Studies: No results found.  Labs:  Basic Metabolic Panel: BMP Latest Ref Rng & Units 10/01/2020 09/24/2020 09/17/2020  Glucose 70 - 99 mg/dL 962(X) 528(U) 132(G)  BUN 8 - 23 mg/dL 17 16 20   Creatinine 0.44 - 1.00 mg/dL 4.01) 0.27(O)  Sodium 135 - 145 mmol/L 136 139 139  Potassium 3.5 - 5.1 mmol/L 4.5 4.7 4.9  Chloride 98 - 111 mmol/L 100 108 105  CO2 22 - 32 mmol/L 26 23 24   Calcium 8.9 - 10.3 mg/dL 9.2 9.1 9.4     CBC: CBC Latest Ref Rng & Units 10/01/2020 09/27/2020 09/24/2020  WBC 4.0 - 10.5 K/uL 6.3 6.2 8.7  Hemoglobin 12.0 - 15.0 g/dL 09/29/2020) 11/24/2020) 4.4(I)  Hematocrit 36.0 - 46.0 % 30.6(L) 27.3(L) 29.3(L)  Platelets 150 - 400 K/uL 157 152 175     CBG: Recent Labs  Lab 10/02/20 0636 10/02/20 1144 10/02/20 1639 10/02/20 2101 10/03/20 0544  GLUCAP 157* 136* 206* 199* 297*    Brief HPI:   Diane Willis is a 65 y.o. 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  Neurology was consulted for input and work-up done revealing B12 and thiamine deficiency with elevated MMA and homocystine levels.  She was started on B12  supplementation for treatment of dorsal column syndrome.  Recommendations were for B12 injections daily x7 days followed by weekly x7 weeks then monthly lifelong.  Therapy initiated and patient was noted to have weakness with feeling of imbalance with standing attempts and CIR was recommended due to functional decline.    Hospital Course: Diane Willis was admitted to rehab 09/06/2020 for inpatient therapies to consist of PT and OT at least three hours five days a week. Past admission physiatrist, therapy team and rehab RN have worked together to provide customized collaborative inpatient rehab. BLE Dopplers done were negative for DVT.  She was maintained on ASA initially due to thrombocytopenia.  As platelets normalized, she was transitioned to Lovenox for DVT prophylaxis on 08/30 and was advised to continue this for 2 total months.   Her blood pressures were monitored on TID basis and have been stable.  She was maintained on vitamin B12 supplements during her stay and is to continue on weekly supplementation through 10/07. Follow up CBC showed resolution of neutropenia and thrombocytopenia and steady improvement in H&H.  Serial check of BMET showed lytes and renal is WNL.  Diabetes has been monitored with ac/hs CBG checks and SSI was use prn for tighter BS control.  Blood sugars have been labile requiring frequent titration of insulin for control and to prevent hypoglycemic episodes.  .She was started on toileting schedule to help with manage incontinence.  She was started on  bowel program with good results.  Spasticity has been managed with titration of baclofen to 10 mg 3 times daily.  Paxil was added with improvement in anxiety.  She has made good gains during her stay and is currently at supervision level. She will continue to receive follow up HHPT and HHOT by [facility] after discharge.    Rehab course: During patient's stay in rehab weekly team conferences were held to monitor patient's  progress, set goals and discuss barriers to discharge. At admission, patient required mod assist with ADL tasks and with mobility. She  has had improvement in activity tolerance, balance, postural control as well as ability to compensate for deficits. She has had improvement in functional use RLE and LLE as well as improvement in awareness.  She is able to complete ADL tasks with min assist. She requires supervision for transfers and to ambulate 200 feet with rolling walker.   Family education was completed with husband.  Disposition: home  Diet: Carb Modified.   Special Instructions: To continue weekly Vitamin B12 injections thorough 10/07 followed by monthly life long supplementation.  Monitor BS ac/hs and  To continue lovenox for DVT prophylaxis thorough 11/11/20.   Allergies as of 10/03/2020   No Known Allergies      Medication List     STOP taking these medications    famotidine 20 MG tablet Commonly known as: PEPCID   hydrocortisone 2.5 % rectal cream Commonly known as: ANUSOL-HC   hydrOXYzine 25 MG tablet Commonly known as: ATARAX/VISTARIL   Januvia 100 MG tablet Generic drug: sitaGLIPtin   metFORMIN 500 MG tablet Commonly known as: GLUCOPHAGE   pantoprazole 40 MG tablet Commonly known as: PROTONIX       TAKE these medications    acetaminophen 325 MG tablet Commonly known as: TYLENOL Take 325-650 mg by mouth every 6 (six) hours as needed for moderate pain or headache.   ALPRAZolam 0.25 MG tablet Commonly known as: XANAX Take 1 tablet (0.25 mg total) by mouth 3 (three) times daily as needed for anxiety.   aspirin EC 81 MG tablet Take 81 mg by mouth daily. Swallow whole.   baclofen 10 MG tablet Commonly known as: LIORESAL Take 1 tablet (10 mg total) by mouth 3 (three) times daily.   bisacodyl 10 MG suppository Commonly known as: DULCOLAX Place 1 suppository (10 mg total) rectally daily at 8 pm.   cyanocobalamin 1000 MCG/ML injection Commonly known  as: (VITAMIN B-12) Inject 1 mL (1,000 mcg total) into the muscle once a week for 7 doses. End date 10/20/20 What changed: additional instructions   docusate sodium 100 MG capsule Commonly known as: COLACE Take 1 capsule (100 mg total) by mouth 2 (two) times daily as needed for mild constipation or moderate constipation. What changed:  medication strength how much to take when to take this reasons to take this   enoxaparin 40 MG/0.4ML injection Commonly known as: LOVENOX Inject 0.4 mLs (40 mg total) into the skin daily.   insulin aspart protamine- aspart (70-30) 100 UNIT/ML injection Commonly known as: NOVOLOG MIX 70/30 Inject 0.15 mLs (15 Units total) into the skin 2 (two) times daily with a meal. What changed:  how much to take when to take this additional instructions   PARoxetine 20 MG tablet Commonly known as: PAXIL Take 1 tablet (20 mg total) by mouth at bedtime.   simvastatin 40 MG tablet Commonly known as: ZOCOR Take 40 mg by mouth at bedtime.   thiamine 100 MG  tablet Take 1 tablet (100 mg total) by mouth daily.   Vitamin D (Ergocalciferol) 1.25 MG (50000 UNIT) Caps capsule Commonly known as: DRISDOL Take 1 capsule (50,000 Units total) by mouth every Friday.        Follow-up Information     Lovorn, Aundra Millet, MD Follow up.   Specialty: Physical Medicine and Rehabilitation Why: office will call you with follow up appt Contact information: [address] [place] [address] [phone]         Fleet Contras, MD. Go on 10/08/2020.   Specialty: Internal Medicine Why: appointment at 2 pm for post hospital follow up Contact information: [address] [place] [address] [phone]                 Signed: Jacquelynn Cree 10/08/2020, 2:36 AM

## 2020-10-03 NOTE — Progress Notes (Signed)
PROGRESS NOTE   Subjective/Complaints: Patient seen sitting up in bed this morning.  She states she slept well overnight.  She is excited for discharge.  ROS: Denies CP, SOB, N/V/D  Objective:   No results found. Recent Labs    10/01/20 0624  WBC 6.3  HGB 9.8*  HCT 30.6*  PLT 157     Recent Labs    10/01/20 0624  NA 136  K 4.5  CL 100  CO2 26  GLUCOSE 176*  BUN 17  CREATININE 0.98  CALCIUM 9.2      Intake/Output Summary (Last 24 hours) at 10/03/2020 1031 Last data filed at 10/03/2020 0814 Gross per 24 hour  Intake 720 ml  Output --  Net 720 ml          Physical Exam: Vital Signs Blood pressure 138/66, pulse 73, temperature 98.2 F (36.8 C), temperature source Oral, resp. rate 18, height 5\' 1"  (1.549 m), weight 81.9 kg, SpO2 99 %. Constitutional: No distress . Vital signs reviewed. HENT: Normocephalic.  Atraumatic. Eyes: EOMI. No discharge. Cardiovascular: No JVD.  RRR. Respiratory: Normal effort.  No stridor.  Bilateral clear to auscultation. GI: Non-distended.  BS +. Skin: Warm and dry.  Intact. Psych: Normal mood.  Normal behavior. Musc: No edema in extremities.  No tenderness in extremities. Neuro: Alert and oriented Sensation decreased to light touch in hands and below knees B/L, stable Motor: : RUE- 4/5 proximal to distal LUE- 4/5 proximal to distal RLE- HF 4--4/5, stable LLE- HF 4--4/5     Assessment/Plan: 1. Functional deficits which require 3+ hours per day of interdisciplinary therapy in a comprehensive inpatient rehab setting. Physiatrist is providing close team supervision and 24 hour management of active medical problems listed below. Physiatrist and rehab team continue to assess barriers to discharge/monitor patient progress toward functional and medical goals  Care Tool:  Bathing    Body parts bathed by patient: Right arm, Left arm, Chest, Abdomen, Front perineal area,  Buttocks, Right upper leg, Left upper leg, Face   Body parts bathed by helper: Buttocks Body parts n/a: Right lower leg, Left lower leg   Bathing assist Assist Level: Minimal Assistance - Patient > 75%     Upper Body Dressing/Undressing Upper body dressing   What is the patient wearing?: Pull over shirt    Upper body assist Assist Level: Independent    Lower Body Dressing/Undressing Lower body dressing      What is the patient wearing?: Incontinence brief, Pants     Lower body assist Assist for lower body dressing: Minimal Assistance - Patient > 75%     Toileting Toileting    Toileting assist Assist for toileting: Minimal Assistance - Patient > 75%     Transfers Chair/bed transfer  Transfers assist     Chair/bed transfer assist level: Supervision/Verbal cueing Chair/bed transfer assistive device:   Ambulation assist   Ambulation activity did not occur: Safety/medical concerns (Anxiety, BLE weakness, SCI)  Assist level: Supervision/Verbal cueing Assistive device: Walker-rolling Max distance: 200'   Walk 10 feet activity   Assist  Walk 10 feet activity did not occur: Safety/medical concerns (Anxiety, BLE weakness, SCI)  Assist level: Supervision/Verbal cueing Assistive device: Walker-rolling   Walk 50 feet activity   Assist Walk 50 feet with 2 turns activity did not occur: Safety/medical concerns  Assist level: Supervision/Verbal cueing Assistive device: Walker-rolling    Walk 150 feet activity   Assist Walk 150 feet activity did not occur: Safety/medical concerns (Anxiety, BLE weakness, SCI)  Assist level: Supervision/Verbal cueing Assistive device: Walker-rolling    Walk 10 feet on uneven surface  activity   Assist Walk 10 feet on uneven surfaces activity did not occur: Safety/medical concerns         Wheelchair     Assist Is the patient using a wheelchair?: No Type of Wheelchair:  Manual Wheelchair activity did not occur: Safety/medical concerns (High anxiety, fear-avoidance behavior)  Wheelchair assist level: Minimal Assistance - Patient > 75% Max wheelchair distance: 150'    Wheelchair 50 feet with 2 turns activity    Assist    Wheelchair 50 feet with 2 turns activity did not occur: Safety/medical concerns (High anxiety, fear-avoidance behavior)   Assist Level: Minimal Assistance - Patient > 75%   Wheelchair 150 feet activity     Assist  Wheelchair 150 feet activity did not occur: Safety/medical concerns (High anxiety, fear-avoidance behavior)   Assist Level: Minimal Assistance - Patient > 75%    Medical Problem List and Plan: 1.  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  Antithrombotics: -DVT/anticoagulation:  Mechanical: Sequential compression devices, below knee Bilateral lower extremities  Dopplers (-) on 8/27 Lovenox              -antiplatelet therapy: ASA             --H/o PE last year 3. Pain Management: Tylenol prn.  4. Mood: LCSW to follow for evaluation and support.              -antipsychotic agents: N/A 5. Neuropsych: This patient is capable of making decisions on his own behalf. 6. Skin/Wound Care: Routine pressure relief measures.  7. Fluids/Electrolytes/Nutrition: Monitor I/Os 8.  T2DM with hyperglycemia: Hgb A1C-7.8--monitor BS ac/hs and use SSI for elevated Bs             Insulin 70/30 decreased to 20 daily on 9/15, decreased to 18 on 9/17, decreased to 15 on 9/19  Insulin 70/30 decreased to 10 nightly on 9/14, increased to 15 on 9/15   CBG (last 3)  Recent Labs    10/02/20 1639 10/02/20 2101 10/03/20 0544  GLUCAP 206* 199* 297*    Labile on 9/29, continue to monitor laboratory setting with further adjustments 9. Vitamin B12: B12< 50.               --Continue  B12 1000 mcg daily X 7 days-->weekly for 7 weeks then monthly.  --lifelong B12 1000 mcg monthly supplement after loading doses.  10. 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 Thrombocytopenia: Resolved 11. Vitamin B1 deficiency: Thiamine 53.1--continue supplement.  12. Anxiety d/o: Continue Xanax prn.  13. 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  Relatively controlled on 9/21 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  Continue bowel program 15.  Spasticity-  Increased baclofen to 10 mg TID. For extensor tone.    Stable on 9/15 16. Anxiety/fear of falling- Continue Paxil 20 mg nightly for anxiety since already on Xanax prn  17.  Macrocytic anemia  Severely low vitamin B12, continue supplementation Hemoglobin 9.8 on 9/19 18.  Creatinine now normal at 0.98 on 9/19   LOS: 27 days A FACE TO FACE EVALUATION WAS PERFORMED  Diane Willis 10/03/2020, 10:31 AM

## 2020-10-03 NOTE — Progress Notes (Signed)
Recreational Therapy Discharge Summary Patient Details  Name: DIVA LEMBERGER MRN: [id number] Date of Birth: [birth year] Today's Date: 10/03/2020  Long term goals set: 1  Long term goals met: 1  Comments on progress toward goals: Pt has made good progress during LOS and is discharging home with family to provide 24 hour supervision/assistance.  TR sessions focused on activity analysis identifying potential modifications, energy conservation techniques, stress management/coping strategies as well as safe completion of leisure tasks.  Pt is able to complete simple seated tasks with set up assistance.  Goal met.  Reasons goals not met: n/a  Equipment acquired: n/a  Reasons for discharge: discharge from hospital  Patient/family agrees with progress made and goals achieved: Yes  Ankit Degregorio 10/03/2020, 11:59 AM

## 2020-10-03 NOTE — Progress Notes (Signed)
Patient ID: Diane Willis, female   DOB: [birth year], 65 y.o.   MRN: [id number]  Per medical team, pt needs vitamin b12 shot weekly beginning on 9/23 through 10/14. HHSN approved with [facility].   Cecile Sheerer, MSW, LCSWA Office: [phone] Cell: [phone] Fax: [phone]

## 2020-10-03 NOTE — Progress Notes (Signed)
Patient discharged to home with family, no questions or concerns at this time.

## 2020-10-04 ENCOUNTER — Telehealth: Payer: Self-pay

## 2020-10-04 NOTE — Telephone Encounter (Signed)
Transitional Care call--who you talked with    Are you/is patient experiencing any problems since coming home? No Are there any questions regarding any aspect of care? No Are there any questions regarding medications administration/dosing? No Are meds being taken as prescribed? Yes Patient should review meds with caller to confirm Have there been any falls? No Has Home Health been to the house and/or have they contacted you? No If not, have you tried to contact them? Will call on Monday if haven't  heard anything. Can we help you contact them? Are bowels and bladder emptying properly? Yes Are there any unexpected incontinence issues? No If applicable, is patient following bowel/bladder programs? Any fevers, problems with breathing, unexpected pain? No Are there any skin problems or new areas of breakdown? No  Has the patient/family member arranged specialty MD follow up (ie cardiology/neurology/renal/surgical/etc)? Yes Can we help arrange? Does the patient need any other services or support that we can help arrange? No Are caregivers following through as expected in assisting the patient? Yes Has the patient quit smoking, drinking alcohol, or using drugs as recommended? Yes  Appointment time 9:20 am, arrive time 9:00 am with Dr. Berline Chough on 11/07/20 [address]

## 2020-10-05 ENCOUNTER — Telehealth: Payer: Self-pay

## 2020-10-05 NOTE — Telephone Encounter (Signed)
Problem with nurse coming out giving Vit B12 injection. Called patient back and she states [facility] nurse can't come out til Monday to give Vit B12 injection. Spoke to  Dr. Carlis Abbott and she states its ok for Monday. Pt notified.

## 2020-10-08 ENCOUNTER — Other Ambulatory Visit (HOSPITAL_COMMUNITY): Payer: Self-pay

## 2020-10-08 ENCOUNTER — Other Ambulatory Visit: Payer: Self-pay | Admitting: Internal Medicine

## 2020-10-08 DIAGNOSIS — E519 Thiamine deficiency, unspecified: Secondary | ICD-10-CM

## 2020-10-09 LAB — CBC
HCT: 32.5 % — ABNORMAL LOW (ref 35.0–45.0)
Hemoglobin: 10.2 g/dL — ABNORMAL LOW (ref 11.7–15.5)
MCH: 31.2 pg (ref 27.0–33.0)
MCHC: 31.4 g/dL — ABNORMAL LOW (ref 32.0–36.0)
MCV: 99.4 fL (ref 80.0–100.0)
MPV: 14.5 fL — ABNORMAL HIGH (ref 7.5–12.5)
Platelets: 206 10*3/uL (ref 140–400)
RBC: 3.27 10*6/uL — ABNORMAL LOW (ref 3.80–5.10)
RDW: 13.2 % (ref 11.0–15.0)
WBC: 5.5 10*3/uL (ref 3.8–10.8)

## 2020-10-09 LAB — BASIC METABOLIC PANEL WITH GFR
BUN/Creatinine Ratio: 14 (calc) (ref 6–22)
BUN: 15 mg/dL (ref 7–25)
CO2: 25 mmol/L (ref 20–32)
Calcium: 9.9 mg/dL (ref 8.6–10.4)
Chloride: 103 mmol/L (ref 98–110)
Creat: 1.06 mg/dL — ABNORMAL HIGH (ref 0.50–1.05)
Glucose, Bld: 198 mg/dL — ABNORMAL HIGH (ref 65–99)
Potassium: 4.4 mmol/L (ref 3.5–5.3)
Sodium: 140 mmol/L (ref 135–146)
eGFR: 59 mL/min/{1.73_m2} — ABNORMAL LOW (ref >=60)

## 2020-10-21 ENCOUNTER — Telehealth (HOSPITAL_BASED_OUTPATIENT_CLINIC_OR_DEPARTMENT_OTHER): Payer: Self-pay | Admitting: Emergency Medicine

## 2020-10-22 ENCOUNTER — Other Ambulatory Visit (HOSPITAL_COMMUNITY): Payer: Self-pay

## 2020-10-23 ENCOUNTER — Telehealth (HOSPITAL_COMMUNITY): Payer: Self-pay | Admitting: Pharmacist

## 2020-10-23 NOTE — Telephone Encounter (Signed)
Pharmacy Transitions of Care Follow-up Telephone Call  Date of discharge: 10/03/20   How have you been since you were released from the hospital? Good  Medication changes made at discharge:  - START: Lovenox, baclofen, paxil, bisacodyl  - STOPPED: famotidine, hydrocortisone cream, hydrOXYzine, Januvia, metFORMIN,  pantoprazole  - CHANGED: cyanocobalamin, docusate sodium, insulin aspart protamine- aspart  Medication changes verified by the patient? Yes     Medication Accessibility:  Home Pharmacy: NA   Medication Review:  ENOXAPARIN - Please verify dosing and anticipated length of therapy. Read discharge note to confirm date of completion and verify this with the patien   If their condition worsens, is the pt aware to call PCP or go to the Emergency Dept.? Yes  Final Patient Assessment: Patient reports she is doing great!  Reviewed medications along with changes and possible side effects.

## 2020-11-07 ENCOUNTER — Encounter: Payer: BC Managed Care – PPO | Admitting: Physical Medicine and Rehabilitation

## 2020-11-19 ENCOUNTER — Encounter
Payer: BC Managed Care – PPO | Attending: Physical Medicine and Rehabilitation | Admitting: Physical Medicine and Rehabilitation

## 2020-11-19 ENCOUNTER — Other Ambulatory Visit: Payer: Self-pay | Admitting: Physical Medicine and Rehabilitation

## 2020-11-19 ENCOUNTER — Encounter: Payer: Self-pay | Admitting: Physical Medicine and Rehabilitation

## 2020-11-19 ENCOUNTER — Other Ambulatory Visit: Payer: Self-pay

## 2020-11-19 VITALS — BP 144/68 | HR 92 | Temp 99.4°F | Ht 61.0 in | Wt 167.4 lb

## 2020-11-19 DIAGNOSIS — G8250 Quadriplegia, unspecified: Secondary | ICD-10-CM | POA: Insufficient documentation

## 2020-11-19 DIAGNOSIS — R252 Cramp and spasm: Secondary | ICD-10-CM | POA: Insufficient documentation

## 2020-11-19 DIAGNOSIS — E519 Thiamine deficiency, unspecified: Secondary | ICD-10-CM | POA: Diagnosis not present

## 2020-11-19 MED ORDER — BACLOFEN 10 MG PO TABS: 10.0000 mg | ORAL_TABLET | Freq: Three times a day (TID) | ORAL | 5 refills | Status: DC

## 2020-11-19 NOTE — Progress Notes (Signed)
Subjective:    Patient ID: Diane Willis, female    DOB: 1955/04/20, 65 y.o.   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   .  Is walking with rollator- Things going well.   Still having H/H therapy- 2x/week. And nurse comes and gives Vit B12 shot.   Has seen PCP since home- suggest getting Vit B12 shot from PCP.    No pain. Bowel/bladder- going on her own- no intervention- no bowel program- no constipation. And emptying when voids.   Anxiety a whole lot better- hasn't taken Celexa or Xanax at home.  Spasticity- has bene taking Baclofen- ran out 2 weeks ago- noticed  a little difference when ran out.    Finished the Lovenox on 10/30-   Gets better strength every time gets a B12 shot- per husband.    Pain Inventory Average Pain 0 Pain Right Now 0 My pain is  no pain  In the last 24 hours, has pain interfered with the following? General activity 0 Relation with others 10 Enjoyment of life 10 What TIME of day is your pain at its worst?  Sleep (in general) Good  Pain is worse with:  no pain Pain improves with:  no pain Relief from Meds:  no pain  walk without assistance walk with assistance use a walker ability to climb steps?  yes do you drive?  no  not employed: date last employed . I need assistance with the following:  meal prep, household duties, and shopping  trouble walking  Any changes since last visit?  no  Any changes since last visit?  no Has see Dr Concepcion Elk since discharge from inpt rehab   Family History  Problem Relation Age of Onset   Healthy Mother    COPD Father    Healthy Sister    HIV Brother    Healthy Sister    HIV Sister    HIV Brother    HIV Brother    HIV Brother    HIV Brother    CAD Neg Hx    Diabetes Mellitus II  Neg Hx    Social History   Socioeconomic History   Marital status: Married    Spouse name: Not on file   Number of children: 3   Years of education: Not on file   Highest education level: Not on file  Occupational History   Not on file  Tobacco Use   Smoking status: Never   Smokeless tobacco: Never  Vaping Use   Vaping Use: Never used  Substance and Sexual Activity   Alcohol use: No   Drug use: No   Sexual activity: Yes  Other Topics Concern   Not on file  Social History Narrative   Not on file   Social Determinants of Health   Financial Resource Strain: Not on file  Food Insecurity: Not on file  Transportation Needs: Not on file  Physical Activity: Not on file  Stress: Not on file  Social Connections: Not on file   Past Surgical History:  Procedure Laterality Date   ABDOMINAL HYSTERECTOMY     Past Medical History:  Diagnosis Date   COVID-19 04/2019   Diabetes mellitus without complication (HCC)    Hypertension    Pulmonary emboli (HCC)--presumed post Covid  BP (!) 144/68   Pulse 92   Temp 99.4 F (37.4 C)   Ht 5\' 1"  (1.549 m)   Wt 167 lb 6.4 oz (75.9 kg)   SpO2 98%   BMI 31.63 kg/m   Opioid Risk Score:   Fall Risk Score:  `1  Depression screen PHQ 2/9  Depression screen PHQ 2/9 11/19/2020  Decreased Interest 0  Down, Depressed, Hopeless 0  PHQ - 2 Score 0  Altered sleeping 0  Tired, decreased energy 0  Change in appetite 0  Feeling bad or failure about yourself  0  Trouble concentrating 0  Moving slowly or fidgety/restless 0  Suicidal thoughts 0  PHQ-9 Score 0      Review of Systems  Constitutional: Negative.   HENT: Negative.    Eyes: Negative.   Respiratory: Negative.    Cardiovascular: Negative.   Gastrointestinal: Negative.   Endocrine:       High blood sugars  Genitourinary: Negative.   Musculoskeletal:  Positive for gait problem.  Skin: Negative.   Allergic/Immunologic: Negative.   Hematological: Negative.    Psychiatric/Behavioral: Negative.    All other systems reviewed and are negative.     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 And associated Spasticity-   Will restart baclofen 5 mg 3x/day for 2-3 days, then can increase to 10 mg 3x/day- for spasticity.   2. Educated on spasticity- can get worse for up to 1-2 years past injury.   3. Doesn't need lovenox shot anymore to prevent clots.   4. Doesn't currently use paxil- for mood- if her anxiety gets worse, can call me or PCP and can get Rx for it in future.   5. Continue Vit B12 shot for lifetime.  Per PCP- don't ever let anyone ever say you need to stop that.  Because we don't need you to get weaker.    6. When Home health finishes, call me and can send to outpatient PT if need be.   7. F/U in 3 months- for f/u on SCI  8. 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    I spent a total of 22 minutes on visit- as detailed above- also discussed disability- send me paperwork.

## 2020-11-19 NOTE — Telephone Encounter (Signed)
Refill request for Lovenox injections sent from pharmacy. Would you like to refill?

## 2020-11-19 NOTE — Patient Instructions (Addendum)
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 And associated Spasticity-   Will restart baclofen 5 mg 3x/day for 2-3 days, then can increase to 10 mg 3x/day- for spasticity.   2. Educated on spasticity- can get worse for up to 1-2 years past injury.   3. Doesn't need lovenox shot anymore to prevent clots.   4. Doesn't currently use paxil- for mood- if her anxiety gets worse, can call me or PCP and can get Rx for it in future.   5. Continue Vit B12 shot for lifetime.  Per PCP- don't ever let anyone ever say you need to stop that.  Because we don't need you to get weaker.    6. When Home health finishes, call me and can send to outpatient PT if need be.   7. F/U in 3 months- for f/u on SCI  8. 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 I'm not sure if she will get 100% back or not, but not expected within at least 1 year! Put me down for disability paperwork to come to me.

## 2021-02-19 ENCOUNTER — Telehealth: Payer: Self-pay

## 2021-02-19 NOTE — Telephone Encounter (Signed)
Patient called stating that someone called her and thinks it was Diane Willis. I called patient back stating that I don't see where Diane Willis called and that she is a Diane Willis patient. We discovered that it was the automatic call reminder for upcoming appt.

## 2021-02-22 ENCOUNTER — Encounter
Payer: BC Managed Care – PPO | Attending: Physical Medicine and Rehabilitation | Admitting: Physical Medicine and Rehabilitation

## 2021-02-22 ENCOUNTER — Encounter: Payer: Self-pay | Admitting: Physical Medicine and Rehabilitation

## 2021-02-22 ENCOUNTER — Other Ambulatory Visit: Payer: Self-pay

## 2021-02-22 VITALS — BP 152/65 | HR 83 | Temp 98.6°F | Ht 61.0 in | Wt 182.6 lb

## 2021-02-22 DIAGNOSIS — E538 Deficiency of other specified B group vitamins: Secondary | ICD-10-CM | POA: Diagnosis not present

## 2021-02-22 DIAGNOSIS — E519 Thiamine deficiency, unspecified: Secondary | ICD-10-CM | POA: Diagnosis not present

## 2021-02-22 DIAGNOSIS — G320 Subacute combined degeneration of spinal cord in diseases classified elsewhere: Secondary | ICD-10-CM | POA: Diagnosis not present

## 2021-02-22 DIAGNOSIS — G8250 Quadriplegia, unspecified: Secondary | ICD-10-CM | POA: Diagnosis not present

## 2021-02-22 MED ORDER — BACLOFEN 10 MG PO TABS: 10.0000 mg | ORAL_TABLET | Freq: Three times a day (TID) | ORAL | 5 refills | Status: DC

## 2021-02-22 NOTE — Progress Notes (Signed)
Subjective:    Patient ID: Diane Willis, female    DOB: 10-05-1955, 66 y.o.   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 And associated Spasticity-  Here for f/u on nontraumatic SCI   Still taking Baclofen 10 mg TID- Has helped the muscle spasms/tightness.   Doing H/H 1x week- doesn't think I need ot send her to outpt PT.   Walks and does exercises- goes up and down steps- daily!  Hasn't gotten out of the house walking except outside the house- not in stores so far.  Gets out, but doesn't go into stores.   R leg is a whole lot better, but still feels a little weak.         Pain Inventory Average Pain 0 Pain Right Now 0 My pain is  no pain  In the last 24 hours, has pain interfered with the following? General activity 10 Relation with others 10 Enjoyment of life 10 What TIME of day is your pain at its worst? No pain Sleep (in general) Good  Pain is worse with:  no pain Pain improves with:  no pain Relief from Meds:  no pain  Family History  Problem Relation Age of Onset   Healthy Mother    COPD Father    Healthy Sister    HIV Brother    Healthy Sister    HIV Sister    HIV Brother    HIV Brother    HIV Brother    HIV Brother    CAD Neg Hx    Diabetes Mellitus II Neg Hx    Social History   Socioeconomic History   Marital status: Married    Spouse name: Not on file   Number of children: 3   Years of education: Not on file   Highest education level: Not on file  Occupational History   Not on file  Tobacco Use   Smoking status: Never   Smokeless tobacco: Never  Vaping Use   Vaping Use: Never used  Substance and Sexual Activity   Alcohol use: No   Drug use: No   Sexual activity: Yes  Other Topics Concern   Not on file  Social History  Narrative   Not on file   Social Determinants of Health   Financial Resource Strain: Not on file  Food Insecurity: Not on file  Transportation Needs: Not on file  Physical Activity: Not on file  Stress: Not on file  Social Connections: Not on file   Past Surgical History:  Procedure Laterality Date   ABDOMINAL HYSTERECTOMY     Past Surgical History:  Procedure Laterality Date   ABDOMINAL HYSTERECTOMY     Past Medical History:  Diagnosis Date   COVID-19 04/2019   Diabetes mellitus without complication (HCC)    Hypertension    Pulmonary emboli (HCC)--presumed post Covid    BP (!) 152/65    Pulse 83    Temp 98.6 F (37 C)    Ht 5\' 1"  (1.549 m)    Wt 182 lb 9.6 oz (82.8 kg)    BMI 34.50 kg/m   Opioid Risk Score:   Fall Risk Score:  `1  Depression screen PHQ 2/9  Depression screen [facility] 2/9 02/22/2021 11/19/2020  Decreased Interest 0 0  Down, Depressed, Hopeless  0 0  PHQ - 2 Score 0 0  Altered sleeping - 0  Tired, decreased energy - 0  Change in appetite - 0  Feeling bad or failure about yourself  - 0  Trouble concentrating - 0  Moving slowly or fidgety/restless - 0  Suicidal thoughts - 0  PHQ-9 Score - 0     Review of Systems  Constitutional: Negative.   HENT: Negative.    Eyes: Negative.   Respiratory: Negative.    Cardiovascular: Negative.   Gastrointestinal: Negative.   Endocrine: Negative.   Genitourinary: Negative.   Musculoskeletal: Negative.   Skin: Negative.   Allergic/Immunologic: Negative.   Neurological: Negative.   Hematological: Negative.   Psychiatric/Behavioral: Negative.    All other systems reviewed and are negative.     Objective:   Physical Exam Awake, alert, appropriate, bright affect, accompanied by husband, using rollator, NAD  MS: Ue's 5/5 in biceps, triceps, WE/ grip deltoids and FA B/L  Les- RLE- HF 4/5; KE/KF 5-/5; DF 4+/5 and PF 5/5 LLE- HF 5-/5; otherwise 5/5  Neuro: No clonus at wrists and none at ankles B/L  No  hoffman's B/L  No increased tone in UE or LE's.  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 And associated Spasticity-  Here for f/u on nontraumatic SCI  BP is elevated today- 152/65- I suggest keeping an eye on it. Check 2-3x/week. If stays up; then talk to PCP.   2.  Per HH, is able to walk on her own now!   3. Reduce Baclofen to 5 mg 3x/day- x 1 week- if no symptoms recur, then you can make it as needed- only take if need it. If doesn't need it, don't take at all. Sen t in 10 mg TID 5 refills- just in case.   4. Don't see signs of spasticity on exam, however sometimes can be hidden by Baclofen, so don't say she has no spasticity quite yet.   5. Still not appropriate to go back to work- just started being able to walk short distances on her own- cannot work at this time.   6.  Went over intimacy- have a sense of humor  7. Suggest Senna/Senokot- start with 2 pills initially- then take 1-2 pills daily as needed- get to point not pushing so hard, gets hemorrhoids worsen.  8. F/U in 3 months - on SCI 9. Get out of house- go to stores and walk around- no diet pills   I spent a total of 31   minutes on total care today- >50% coordination of care- due to d/w intimacy and constipation and working.

## 2021-02-22 NOTE — Patient Instructions (Addendum)
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 And associated Spasticity-  Here for f/u on nontraumatic SCI  BP is elevated today- 152/65- I suggest keeping an eye on it. Check 2-3x/week. If stays up; then talk to PCP.   2.  Per HH, is able to walk on her own now!   3. Reduce Baclofen to 5 mg 3x/day- x 1 week- if no symptoms recur, then you can make it as needed- only take if need it. If doesn't need it, don't take at all.   4. Don't see signs of spasticity on exam, however sometimes can be hidden by Baclofen, so don't say she has no spasticity quite yet.   5. Still not appropriate to go back to work- just started being able to walk short distances on her own- cannot work at this time.   6.  Went over intimacy- have a sense of humor  7. Suggest Senna/Senokot- start with 2 pills initially- then take 1-2 pills daily as needed- get to point not pushing so hard, gets hemorrhoids worsen.  8. F/U in 3 months - on SCI  9. Get out of house more- go to stores and walk around. - no diet pills

## 2021-03-06 ENCOUNTER — Telehealth: Payer: Self-pay

## 2021-03-06 NOTE — Telephone Encounter (Signed)
Patient called and stated she wanted a return call from Dr. Berline Chough. Left voicemail to inform patient that she is out of the office this afternoon and will relay the message to her.

## 2021-03-08 ENCOUNTER — Telehealth: Payer: Self-pay | Admitting: Physical Medicine and Rehabilitation

## 2021-03-08 NOTE — Telephone Encounter (Signed)
Husbands job needs a note about her care, he [place] a [place] and would like to be closer to home, just in case she has any issues.  His job needs a letter so that way he can stay closer to home while she recovers.  Any questions please call patient.

## 2021-03-11 ENCOUNTER — Encounter: Payer: Self-pay | Admitting: Physical Medicine and Rehabilitation

## 2021-03-11 NOTE — Progress Notes (Signed)
Have written letter and given to staff- please call pt and see if she wants mailed or however she wants ot receive.   Thanks,  ML

## 2021-03-11 NOTE — Telephone Encounter (Signed)
Please let pt know letter done- and she can pick up or mail to her- thanks- ML

## 2021-03-11 NOTE — Telephone Encounter (Signed)
Pt notified. Mailed

## 2021-05-22 ENCOUNTER — Other Ambulatory Visit: Payer: Self-pay | Admitting: Internal Medicine

## 2021-05-22 DIAGNOSIS — Z1231 Encounter for screening mammogram for malignant neoplasm of breast: Secondary | ICD-10-CM

## 2021-05-24 ENCOUNTER — Ambulatory Visit
Admission: RE | Admit: 2021-05-24 | Discharge: 2021-05-24 | Disposition: A | Discharge status: Home / Self Care | Payer: BC Managed Care – PPO | Source: Ambulatory Visit | Attending: Internal Medicine | Admitting: Internal Medicine

## 2021-05-24 DIAGNOSIS — Z1231 Encounter for screening mammogram for malignant neoplasm of breast: Secondary | ICD-10-CM

## 2021-05-24 IMAGING — MG MM DIGITAL SCREENING BILAT W/ TOMO AND CAD
8 series · 8 of 24 positions shown · non-contrast
Comparison: Previous exam(s).

CLINICAL DATA: Screening.

EXAM:
DIGITAL SCREENING BILATERAL MAMMOGRAM WITH TOMOSYNTHESIS AND CAD
TECHNIQUE: Bilateral screening digital craniocaudal and mediolateral oblique
mammograms were obtained. Bilateral screening digital breast
tomosynthesis was performed. The images were evaluated with
computer-aided detection.

[L CC synth-2D]
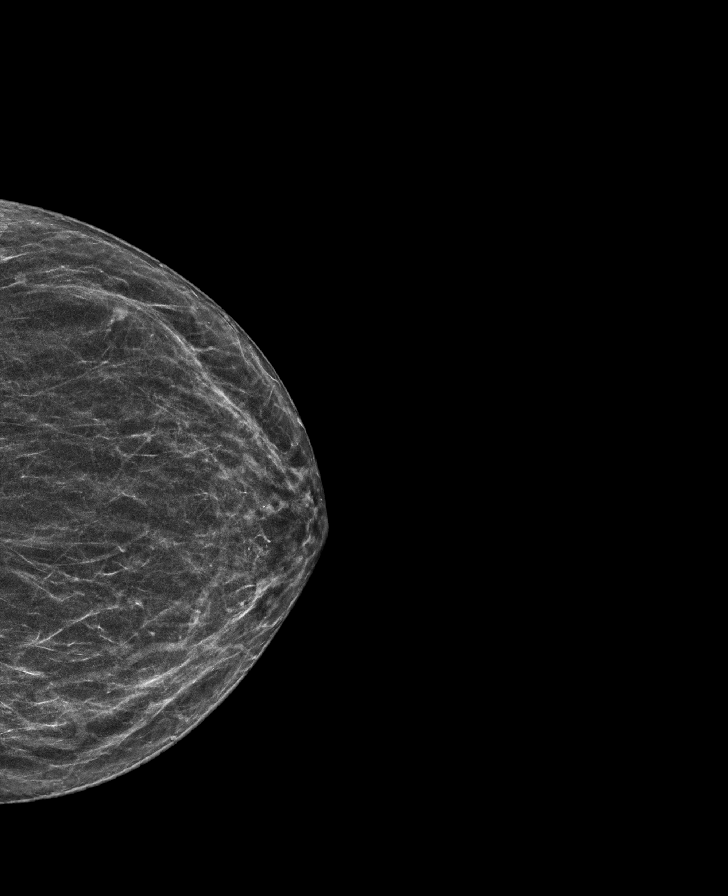

[L MLO synth-2D]
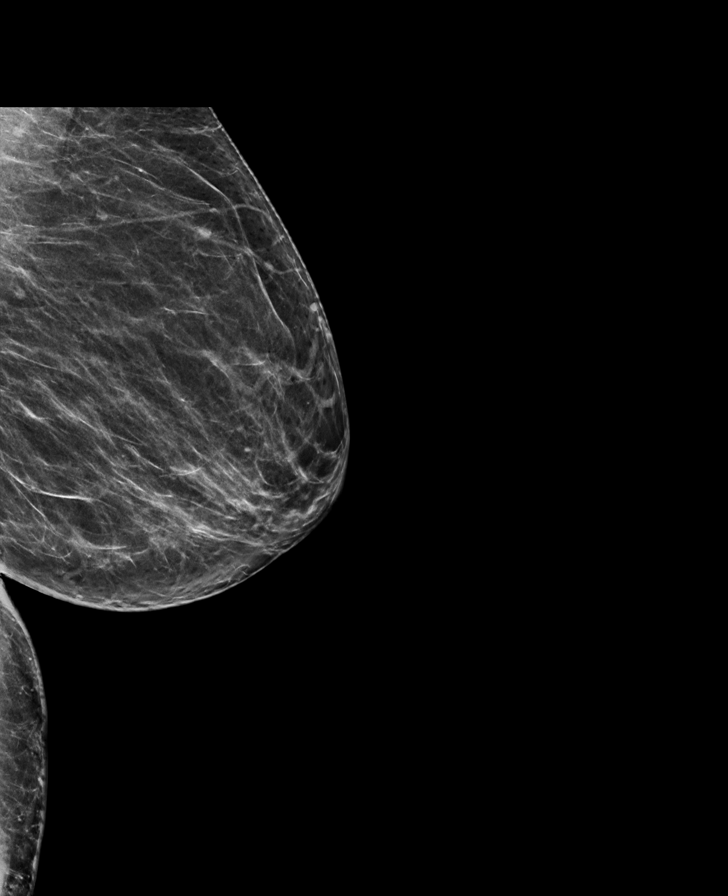

[R CC synth-2D]
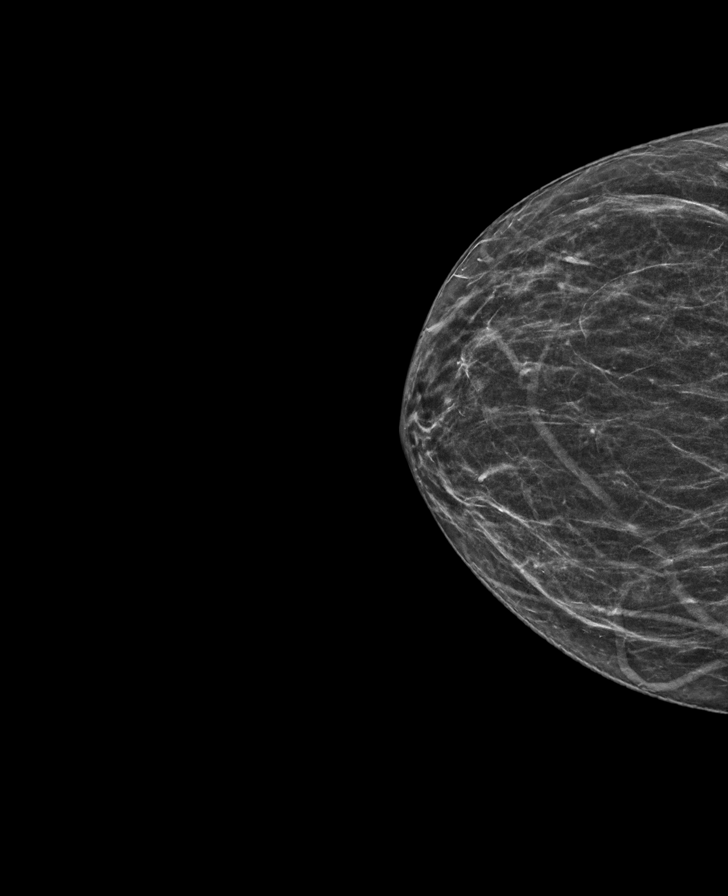

[R MLO synth-2D]
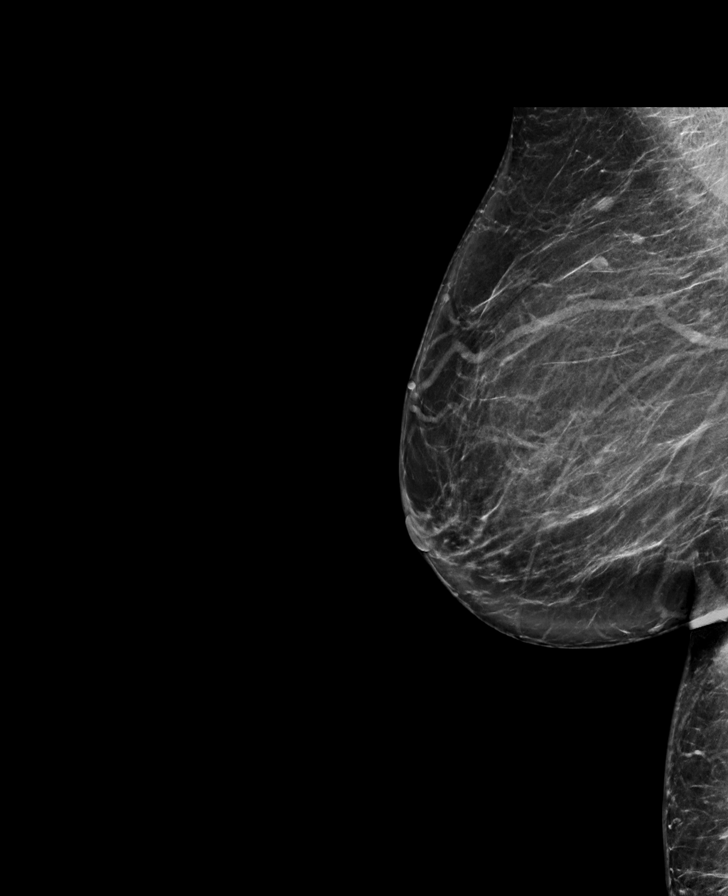

[R MLO tomo · tomo slice 37/74.0]
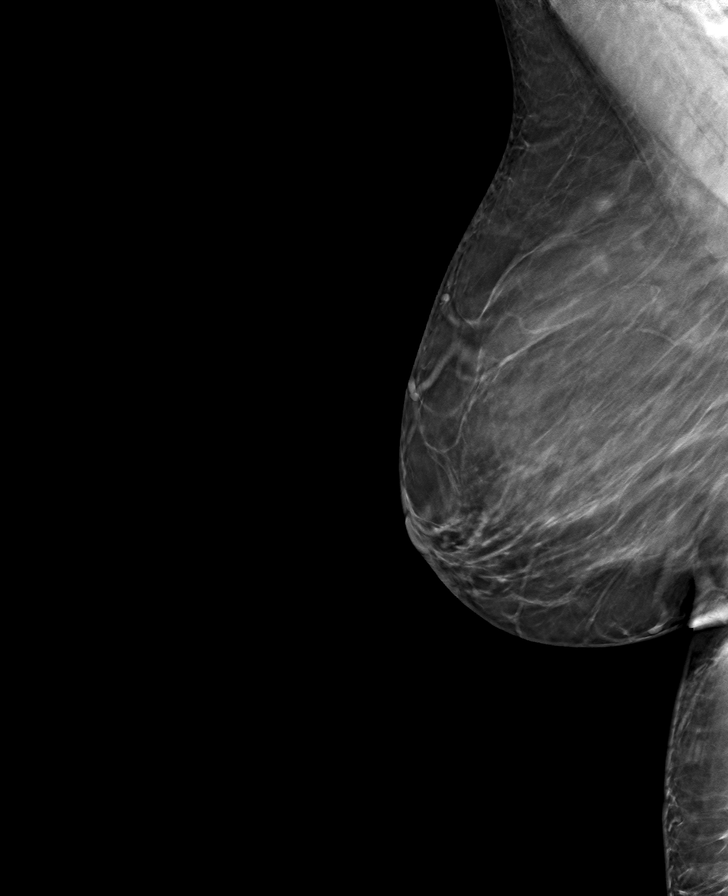

[L CC tomo · tomo slice 29/56.0]
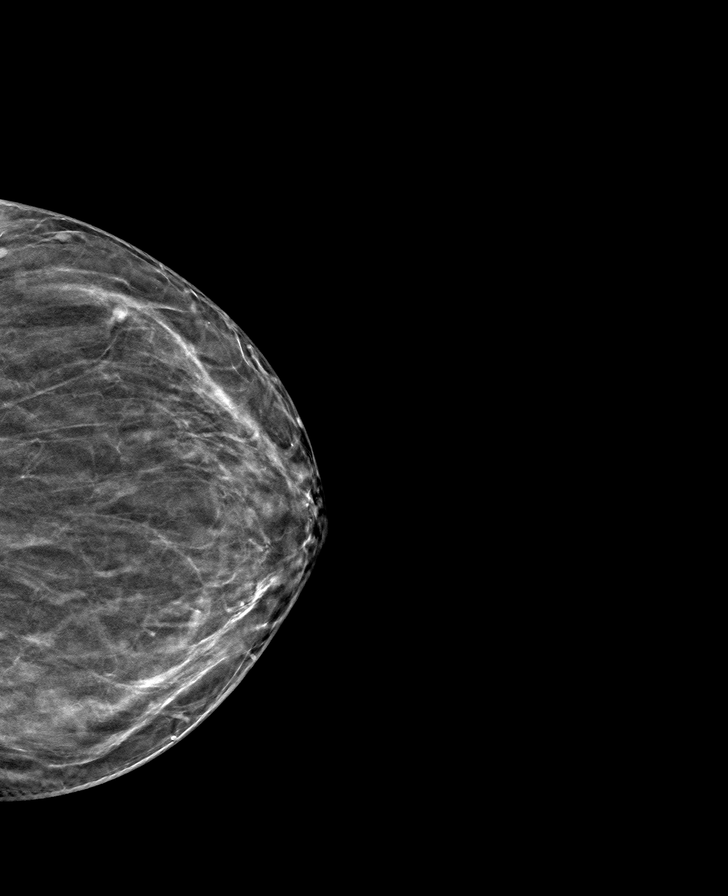

[L MLO tomo · tomo slice 34/67.0]
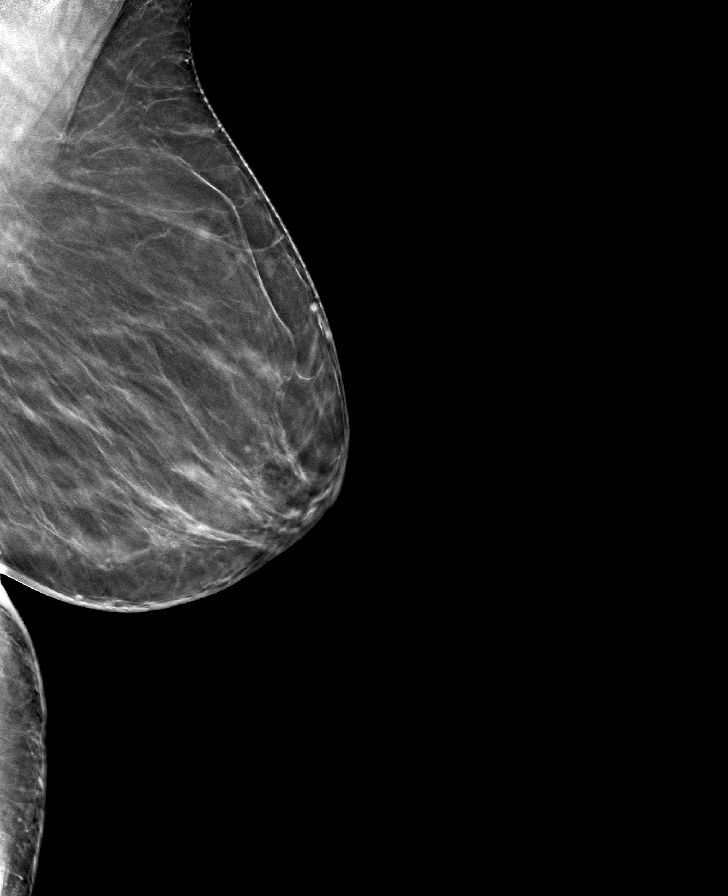

[R CC tomo · tomo slice 28/55.0]
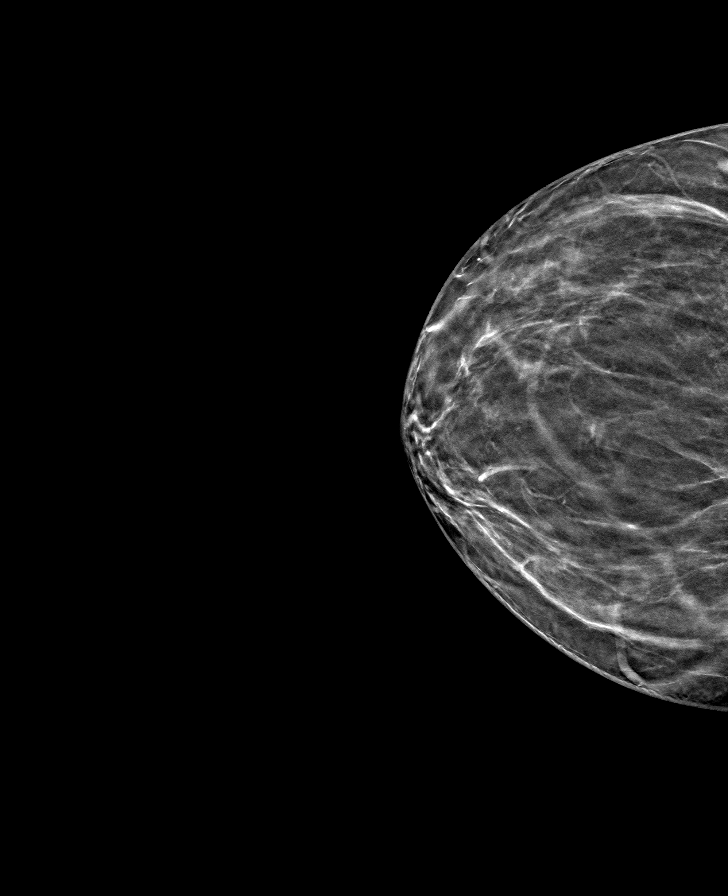

[8 of 24 positions shown; findings below may reference images not displayed]

ACR Breast Density Category b: There are scattered areas of
fibroglandular density.
FINDINGS: There are no findings suspicious for malignancy.
IMPRESSION: No mammographic evidence of malignancy. A result letter of this
screening mammogram will be mailed directly to the patient.

RECOMMENDATION:
Screening mammogram in one year. (Code:[BY])

BI-RADS CATEGORY  1: Negative.

## 2021-06-07 ENCOUNTER — Encounter: Payer: Self-pay | Admitting: Physical Medicine and Rehabilitation

## 2021-06-07 ENCOUNTER — Encounter
Payer: BC Managed Care – PPO | Attending: Physical Medicine and Rehabilitation | Admitting: Physical Medicine and Rehabilitation

## 2021-06-07 VITALS — BP 157/74 | HR 74 | Ht 61.0 in | Wt 183.0 lb

## 2021-06-07 DIAGNOSIS — E519 Thiamine deficiency, unspecified: Secondary | ICD-10-CM | POA: Diagnosis present

## 2021-06-07 DIAGNOSIS — G320 Subacute combined degeneration of spinal cord in diseases classified elsewhere: Secondary | ICD-10-CM | POA: Diagnosis present

## 2021-06-07 DIAGNOSIS — R269 Unspecified abnormalities of gait and mobility: Secondary | ICD-10-CM | POA: Diagnosis present

## 2021-06-07 DIAGNOSIS — G8250 Quadriplegia, unspecified: Secondary | ICD-10-CM | POA: Insufficient documentation

## 2021-06-07 DIAGNOSIS — R252 Cramp and spasm: Secondary | ICD-10-CM | POA: Insufficient documentation

## 2021-06-07 DIAGNOSIS — E538 Deficiency of other specified B group vitamins: Secondary | ICD-10-CM | POA: Insufficient documentation

## 2021-06-07 MED ORDER — BACLOFEN 10 MG PO TABS: 10.0000 mg | ORAL_TABLET | Freq: Three times a day (TID) | ORAL | 5 refills | Status: DC

## 2021-06-07 NOTE — Patient Instructions (Signed)
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 Injury since 8/22.   Will have pt take 10 mg 3x/day- educated on other signs of spasticity- ankles bouncing; toes curling- stiffness that's difficult to go away- all spasticity.  2. BP still up 150s/80s- would like PCP to address BP if stays up still- has been 3 weeks since changed Losartan.    3. Might need Senna/Senokot  since increasing Baclofen to 10 mg 3x/day.  Can also try Dulcolax which both are over the counter.  Suggest at least 1 tab daily.   4.  Retired- so not having to go back to work.    5.  Discussed intimacy- intimacy is more than sexuality- and discussed go slow and easy and comfortable-    6. F/U in 3 months- double visit- SCI

## 2021-06-07 NOTE — Progress Notes (Signed)
Subjective:    Patient ID: Diane Willis, female    DOB: 27-Dec-1955, 66 y.o.   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 And associated Spasticity-  Here for f/u on nontraumatic SCI  Walking with rollator-  No falls or near falls.  Uses rollator in house- Also has regular RW_ uses sometimes.    BP still elevated today- was initially [id number] systolic- now [id number] Taken off Norvasc and Lisinopril- LE swelling and put on Losartan- was done 3 weeks ago.  On losartan-HCTZ 100/25 mg daily.  Last time checked at home-  2 days ago was 135/70.    Spasticity- still there/tightness-  Sometimes takes 5 mg q evening- not TID_ and sometimes 10 mg 1x/day.    Bowels- bowels doing well- hasn't needed Senna- only takes prn.    Still getting H/H therapy- still getting PT but no OT- 1x/week- Has been taken outside- has a sidewalk/driveway that's even- and did without any RW and used gait belt.   could walk in here from car- with RW- and no fatigue.   No pain.      Pain Inventory Average Pain 0 Pain Right Now 0 My pain is  NO PAIN  LOCATION OF PAIN  NO PAIN  BOWEL Number of stools per week: 3-4 Oral laxative use No   BLADDER Normal    Mobility use a walker ability to climb steps?  yes do you drive?  no Do you have any goals in this area?  yes  Function retired Do you have any goals in this area?  yes  Neuro/Psych weakness trouble walking  Prior Studies Any changes since last visit?  yes breast  Physicians involved in your care Any changes since last visit?  no   Family History  Problem Relation Age of Onset   Healthy Mother    COPD Father    Healthy Sister    HIV Brother    Healthy Sister    HIV Sister    HIV Brother    HIV Brother    HIV Brother     HIV Brother    CAD Neg Hx    Diabetes Mellitus II Neg Hx    Social History   Socioeconomic History   Marital status: Married    Spouse name: Not on file   Number of children: 3   Years of education: Not on file   Highest education level: Not on file  Occupational History   Not on file  Tobacco Use   Smoking status: Never   Smokeless tobacco: Never  Vaping Use   Vaping Use: Never used  Substance and Sexual Activity   Alcohol use: No   Drug use: No   Sexual activity: Yes  Other Topics Concern   Not on file  Social History Narrative   Not on file   Social Determinants of Health   Financial Resource Strain: Not on file  Food Insecurity: Not on file  Transportation Needs: Not on file  Physical Activity: Not on file  Stress: Not on file  Social Connections: Not on file   Past Surgical History:  Procedure Laterality Date   ABDOMINAL HYSTERECTOMY     Past Medical History:  Diagnosis Date   COVID-19 04/2019   Diabetes mellitus without complication ([place])  Hypertension    Pulmonary emboli (HCC)--presumed post Covid    Ht 5\' 1"  (1.549 m)   Wt 183 lb (83 kg)   BMI 34.58 kg/m   Opioid Risk Score:   Fall Risk Score:  `1  Depression screen PHQ 2/9     06/07/2021    2:16 PM 02/22/2021    1:51 PM 11/19/2020    1:24 PM  Depression screen PHQ 2/9  Decreased Interest 0 0 0  Down, Depressed, Hopeless 0 0 0  PHQ - 2 Score 0 0 0  Altered sleeping   0  Tired, decreased energy   0  Change in appetite   0  Feeling bad or failure about yourself    0  Trouble concentrating   0  Moving slowly or fidgety/restless   0  Suicidal thoughts   0  PHQ-9 Score   0    Review of Systems  Musculoskeletal:  Positive for gait problem.  All other systems reviewed and are negative.     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 Injury since 8/22.   Will have pt take 10 mg 3x/day- educated on other signs of spasticity- ankles bouncing; toes curling- stiffness that's difficult to go away- all spasticity.  2. BP still up 150s/80s- would like PCP to address BP if stays up still- has been 3 weeks since changed Losartan.    3. Might need Senna/Senokot  since increasing Baclofen to 10 mg 3x/day.  Can also try Dulcolax which both are over the counter.  Suggest at least 1 tab daily.   4.  Retired- so not having to go back to work.    5.  Discussed intimacy- intimacy is more than sexuality- and discussed go slow and easy and comfortable-    6. F/U in 3 months- double visit- SCI  I spent a total of 38   minutes on total care today- >50% coordination of care- due to discussion about intimacy and spasticity.

## 2021-09-27 ENCOUNTER — Encounter
Payer: BC Managed Care – PPO | Attending: Physical Medicine and Rehabilitation | Admitting: Physical Medicine and Rehabilitation

## 2021-09-27 ENCOUNTER — Encounter: Payer: Self-pay | Admitting: Physical Medicine and Rehabilitation

## 2021-09-27 VITALS — BP 181/71 | HR 85 | Ht 61.0 in | Wt 187.2 lb

## 2021-09-27 DIAGNOSIS — G8250 Quadriplegia, unspecified: Secondary | ICD-10-CM | POA: Diagnosis present

## 2021-09-27 DIAGNOSIS — R252 Cramp and spasm: Secondary | ICD-10-CM | POA: Diagnosis present

## 2021-09-27 DIAGNOSIS — R269 Unspecified abnormalities of gait and mobility: Secondary | ICD-10-CM | POA: Insufficient documentation

## 2021-09-27 MED ORDER — BACLOFEN 10 MG PO TABS: 10.0000 mg | ORAL_TABLET | Freq: Three times a day (TID) | ORAL | 1 refills | Status: DC

## 2021-09-27 NOTE — Progress Notes (Signed)
Subjective:    Patient ID: Diane Willis, female    DOB: 28-Jan-1955, 66 y.o.   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 Injury since 8/22.   Things going pretty well.   Walking with rolator- sometimes uses rolator in the house, but not all the time- when someone there, walks without an assistive device.  They are there for supervision, not actual help.  Could walk out to the car, supervision- but probably not much further without rolator.   BP going pretty well at home- usually 130s/60s.  DM- 104 today- last A1c- 6.1-    Baclofen still taking 10 mg TID- baclofen has improved spasticity- "a whole lot better than before".  Spasticity-hasn't progressed anymore than it was-  No constipation from that increase in baclofen.    Discussed using lidocaine - for sexuality-  intimacy is good- with being close.   Thinks BG is dropping- and starting to feel bad-    Upset that has gained weight  Social Hx: Mother in law has alzheimer's-     Pain Inventory Average Pain 2 Pain Right Now 0 My pain is  no pain  In the last 24 hours, has pain interfered with the following? General activity 0 Relation with others 0 Enjoyment of life 0 What TIME of day is your pain at its worst? varies Sleep (in general) Good  Pain is worse with:  no pain Pain improves with:  n/a Relief from Meds: 6  Family History  Problem Relation Age of Onset   Healthy Mother    COPD Father    Healthy Sister    HIV Brother    Healthy Sister    HIV Sister    HIV Brother    HIV Brother    HIV Brother    HIV Brother    CAD Neg Hx    Diabetes Mellitus II Neg Hx    Social History   Socioeconomic History   Marital status: Married     Spouse name: Not on file   Number of children: 3   Years of education: Not on file   Highest education level: Not on file  Occupational History   Not on file  Tobacco Use   Smoking status: Never   Smokeless tobacco: Never  Vaping Use   Vaping Use: Never used  Substance and Sexual Activity   Alcohol use: No   Drug use: No   Sexual activity: Yes  Other Topics Concern   Not on file  Social History Narrative   Not on file   Social Determinants of Health   Financial Resource Strain: Not on file  Food Insecurity: Not on file  Transportation Needs: Not on file  Physical Activity: Not on file  Stress: Not on file  Social Connections: Not on file   Past Surgical History:  Procedure Laterality Date   ABDOMINAL HYSTERECTOMY     Past Surgical History:  Procedure Laterality Date   ABDOMINAL HYSTERECTOMY     Past Medical History:  Diagnosis Date   COVID-19 04/2019   Diabetes mellitus without complication (HCC)    Hypertension    Pulmonary emboli (HCC)--presumed post Covid    BP (!) 183/72   Pulse 84   Ht  5\' 1"  (1.549 m)   Wt 187 lb 3.2 oz (84.9 kg)   SpO2 99%   BMI 35.37 kg/m   Opioid Risk Score:   Fall Risk Score:  `1  Depression screen PHQ 2/9     06/07/2021    2:16 PM 02/22/2021    1:51 PM 11/19/2020    1:24 PM  Depression screen PHQ 2/9  Decreased Interest 0 0 0  Down, Depressed, Hopeless 0 0 0  PHQ - 2 Score 0 0 0  Altered sleeping   0  Tired, decreased energy   0  Change in appetite   0  Feeling bad or failure about yourself    0  Trouble concentrating   0  Moving slowly or fidgety/restless   0  Suicidal thoughts   0  PHQ-9 Score   0      Review of Systems  All other systems reviewed and are negative.     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 Injury since 8/22.   CBG- she felt like it was low- had to check it - brought her a cupcake just in case. BG Was 59- so made her eat.   2. Con't Baclofen- 10 mg 3x/day- for spasticity- will send for 90 days supply with 1 refill- so has  6 months.   3. Can go back to gym and not limited for additional exercises-   4. SCI support group - [facility] here in [place]- off [address]- 6-7 pm on the last Thursday of the month- so next group will be 10/10/21. Go in the front of rehab- regular and deli on L and just past those in conference on the right- husband is welcome as well.   5. F/U in 6 months- double appt.    I spent a total of 27   minutes on total care today- >50% coordination of care- due to prolonged time dealing with BP as well as low BG.

## 2021-09-27 NOTE — Patient Instructions (Signed)
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 Injury since 8/22.   CBG- she felt like it was low- had to check it - brought her a cupcake just in case. BG Was 59- so made her eat.   2. Con't Baclofen- 10 mg 3x/day- for spasticity- will send for 90 days supply with 1 refill- so has  6 months.   3. Can go back to gym and not limited for additional exercises-   4. SCI support group - [facility] here in [place]- off [address]- 6-7 pm on the last Thursday of the month- so next group will be 10/10/21. Go in the front of rehab- regular and deli on L and just past those in conference on the right- husband is welcome as well.   5. F/U in 6 months- double appt.

## 2022-03-28 ENCOUNTER — Encounter: Payer: Self-pay | Admitting: Physical Medicine and Rehabilitation

## 2022-03-28 ENCOUNTER — Encounter
Payer: BC Managed Care – PPO | Attending: Physical Medicine and Rehabilitation | Admitting: Physical Medicine and Rehabilitation

## 2022-03-28 VITALS — BP 184/72 | HR 94 | Ht 61.0 in | Wt 189.0 lb

## 2022-03-28 DIAGNOSIS — I10 Essential (primary) hypertension: Secondary | ICD-10-CM | POA: Diagnosis not present

## 2022-03-28 DIAGNOSIS — R252 Cramp and spasm: Secondary | ICD-10-CM | POA: Insufficient documentation

## 2022-03-28 DIAGNOSIS — G8250 Quadriplegia, unspecified: Secondary | ICD-10-CM | POA: Diagnosis not present

## 2022-03-28 MED ORDER — BACLOFEN 10 MG PO TABS: 10.0000 mg | ORAL_TABLET | Freq: Three times a day (TID) | ORAL | 1 refills | Status: DC

## 2022-03-28 NOTE — Patient Instructions (Signed)
Pt is a 67 yr old female with hx of Vit B12 deficiency causing progressive degeneration of spinal cord causing quadriparesis-- NOW ASIA C initially, now ASIA D- almost ASIA E-  dorsal column syndrome; also has HTN, DM, and here for hospital f/u due to nontraumatic quadriparesis. 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 Injury since 8/22.   Discussed intimacy- and pt feels a little better.    2.  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 Will have nursing recheck her BP after appointment    3. Baclofen for spasticity- will have pt reduce baclofen 5 mg 3x/day- however if spasticity gets worse, just go back to 10 mg 3x/day- will send in 10 mg 3x/day in case you need it- the other scenario- 5 mg 3x/day and have another 15 mg/day AS NEEDED if you need to take for spasms-   4. Patient has had very good recovery- almost ASIA E- only hip flexors 4/5, but all other muscles 5/5 Bilaterally  5. Educated on ASIA and SCI and prognosis.   6. Can go back to gym- no restrictions    7. Can get back to driving- start slow with driving- go to empty parking lot- check reaction time and driving skills- and then can get out on back roads as long doing well and then can continue to progress.   8. F/U in  6 months- double appointment

## 2022-03-28 NOTE — Progress Notes (Signed)
Subjective:    Patient ID: Diane Willis, female    DOB: December 05, 1955, 67 y.o.   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 Injury since 8/22.   Walking with no assistive device.  Still uses the rollator- every once in awhile - like if home by self-   If went to grocery store- would push cart, but not use AD.  Also if went to [facility]!  Exercises at home- interested in going to gym.   Spasticity- has some issues every once in awhile- still taking Baclofen- 10 mg TID-notices it when she overdoes it- rarely.   Intimacy- was concerned about hurting self.  Scared-    Thinks might have lost some weight BP 184/72- when checks at home- Q000111Q systolic DBP Q000111Q   Pain Inventory Average Pain 1 Pain Right Now 0 My pain is intermittent and tingling  In the last 24 hours, has pain interfered with the following? General activity 8 Relation with others 10 Enjoyment of life 10 What TIME of day is your pain at its worst? varies Sleep (in general) Good  Pain is worse with: standing Pain improves with: rest Relief from Meds:  na  Family History  Problem Relation Age of Onset   Healthy Mother    COPD Father    Healthy Sister    HIV Brother    Healthy Sister    HIV Sister    HIV Brother    HIV Brother    HIV Brother    HIV Brother    CAD Neg Hx    Diabetes Mellitus II Neg Hx    Social History   Socioeconomic History   Marital status: Married    Spouse name: Not on file   Number of children: 3   Years of education: Not on file   Highest education level: Not on file  Occupational History   Not on file  Tobacco Use   Smoking status: Never   Smokeless tobacco: Never  Vaping Use   Vaping Use: Never used   Substance and Sexual Activity   Alcohol use: No   Drug use: No   Sexual activity: Yes  Other Topics Concern   Not on file  Social History Narrative   Not on file   Social Determinants of Health   Financial Resource Strain: Not on file  Food Insecurity: Not on file  Transportation Needs: Not on file  Physical Activity: Not on file  Stress: Not on file  Social Connections: Not on file   Past Surgical History:  Procedure Laterality Date   ABDOMINAL HYSTERECTOMY     Past Surgical History:  Procedure Laterality Date   ABDOMINAL HYSTERECTOMY     Past Medical History:  Diagnosis Date   COVID-19 04/2019   Diabetes mellitus without complication (HCC)    Hypertension    Pulmonary emboli (HCC)--presumed post Covid    BP (!) 184/72   Pulse 94   Ht 5\' 1"  (1.549 m)   Wt 189 lb (85.7 kg)   SpO2 99%   BMI 35.71 kg/m   Opioid Risk Score:   Fall Risk Score:  `1  Depression screen [facility] 2/9     06/07/2021    2:16 PM 02/22/2021  1:51 PM 11/19/2020    1:24 PM  Depression screen PHQ 2/9  Decreased Interest 0 0 0  Down, Depressed, Hopeless 0 0 0  PHQ - 2 Score 0 0 0  Altered sleeping   0  Tired, decreased energy   0  Change in appetite   0  Feeling bad or failure about yourself    0  Trouble concentrating   0  Moving slowly or fidgety/restless   0  Suicidal thoughts   0  PHQ-9 Score   0      Review of Systems  Musculoskeletal:        Tingling in bilateral lower legs  All other systems reviewed and are negative.      Objective:   Physical Exam Awake, alert, appropriate, hair in braids, NAD  Neuro- DTRS down to 2+- normalized; no hoffman's and 2-3 beats clonus, but otherwise, spasticity improved No decreased sensation to light touch in any particular places  MS- 5/5 in biceps, triceps, WE, grip and FA B/L HF 4/5- otherwise 5/5 in LE's in KE/KF,DF and PF B/L   Gait- intact gait- very slight reduction in hip flexion with gait-however otherwise, regular gait.        Assessment & Plan:   Pt is a 67 yr old female with hx of Vit B12 deficiency causing progressive degeneration of spinal cord causing quadriparesis-- NOW ASIA C initially, now ASIA D- almost ASIA E-  dorsal column syndrome; also has HTN, DM, and here for hospital f/u due to nontraumatic quadriparesis. 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 Injury since 8/22.   Discussed intimacy- and pt feels a little better.    2.  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 Will have nursing recheck her BP after appointment    3. Baclofen for spasticity- will have pt reduce baclofen 5 mg 3x/day- however if spasticity gets worse, just go back to 10 mg 3x/day- will send in 10 mg 3x/day in case you need it- the other scenario- 5 mg 3x/day and have another 15 mg/day AS NEEDED if you need to take for spasms-   4. Patient has had very good recovery- almost ASIA E- only hip flexors 4/5, but all other muscles 5/5 Bilaterally  5. Educated on ASIA and SCI and prognosis.   6. Can go back to gym- no restrictions    7. Can get back to driving- start slow with driving- go to empty parking lot- check reaction time and driving skills- and then can get out on back roads as long doing well and then can continue to progress.   8. F/U in  6 months- double appointment    I spent a total of  35  minutes on total care today- >50% coordination of care- due to education on driving, gym, spasticity and intimacy

## 2022-04-24 ENCOUNTER — Other Ambulatory Visit: Payer: Self-pay | Admitting: Internal Medicine

## 2022-04-24 DIAGNOSIS — Z0000 Encounter for general adult medical examination without abnormal findings: Secondary | ICD-10-CM

## 2022-06-02 ENCOUNTER — Ambulatory Visit
Admission: RE | Admit: 2022-06-02 | Discharge: 2022-06-02 | Disposition: A | Discharge status: Home / Self Care | Payer: BC Managed Care – PPO | Source: Ambulatory Visit | Attending: Internal Medicine | Admitting: Internal Medicine

## 2022-06-02 DIAGNOSIS — Z0000 Encounter for general adult medical examination without abnormal findings: Secondary | ICD-10-CM

## 2022-06-19 ENCOUNTER — Other Ambulatory Visit: Payer: Self-pay | Admitting: Internal Medicine

## 2022-06-21 LAB — URINE CULTURE
MICRO NUMBER:: 15050171
SPECIMEN QUALITY:: ADEQUATE

## 2022-07-31 ENCOUNTER — Other Ambulatory Visit: Payer: Self-pay | Admitting: Internal Medicine

## 2022-08-02 LAB — URINE CULTURE
MICRO NUMBER:: 15217725
SPECIMEN QUALITY:: ADEQUATE

## 2022-09-26 ENCOUNTER — Encounter: Payer: Self-pay | Admitting: Physical Medicine and Rehabilitation

## 2022-09-26 ENCOUNTER — Encounter
Payer: BC Managed Care – PPO | Attending: Physical Medicine and Rehabilitation | Admitting: Physical Medicine and Rehabilitation

## 2022-09-26 VITALS — BP 161/65 | HR 73 | Ht 61.0 in | Wt 171.0 lb

## 2022-09-26 DIAGNOSIS — G8250 Quadriplegia, unspecified: Secondary | ICD-10-CM | POA: Diagnosis present

## 2022-09-26 DIAGNOSIS — R269 Unspecified abnormalities of gait and mobility: Secondary | ICD-10-CM | POA: Diagnosis not present

## 2022-09-26 DIAGNOSIS — R252 Cramp and spasm: Secondary | ICD-10-CM | POA: Insufficient documentation

## 2022-09-26 MED ORDER — BACLOFEN 10 MG PO TABS
5.0000 mg | ORAL_TABLET | Freq: Three times a day (TID) | ORAL | 1 refills | Status: DC
Start: 2022-09-26 — End: 2023-03-11

## 2022-09-26 NOTE — Progress Notes (Signed)
Subjective:    Patient ID: Diane Willis, female    DOB: Mar 23, 1955, 67 y.o.   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 Injury since 8/22.   Things going well  No Assistive device Still taking baclofen 1/2 pill 2x/day - working well-  Spasticity getting better! Has but not as often-  Doing HEP   Had a "soft fall"-  nike slide-in shoes-  R foot slid from out of under her-  Doing better- no injury- no falls since got rid of shoes.    Haven't started driving yet-  Hasn't done with husband yet.   Would rather do with husband to drive, not other family members.   Intimacy/closeness with husband better.   Does a large HEP- every day- Can almost walk around [employer]- without stopping to sit down- but real tired at the end.    Pain Inventory Average Pain 0 Pain Right Now 0 My pain is intermittent, sharp, dull, and tingling  In the last 24 hours, has pain interfered with the following? General activity 0 Relation with others 0 Enjoyment of life 0 What TIME of day is your pain at its worst? varies Sleep (in general) Good  Pain is worse with: sitting and some activites Pain improves with:  exercise Relief from Meds:  no pain meds  Family History  Problem Relation Age of Onset   Healthy Mother    COPD Father    Healthy Sister    HIV Brother    Healthy Sister    HIV Sister    HIV Brother    HIV Brother    HIV Brother    HIV Brother    CAD Neg Hx    Diabetes Mellitus II Neg Hx    Social History   Socioeconomic History   Marital status: Married    Spouse name: Not on file   Number of children: 3   Years of education: Not on file   Highest education level: Not on file  Occupational  History   Not on file  Tobacco Use   Smoking status: Never   Smokeless tobacco: Never  Vaping Use   Vaping status: Never Used  Substance and Sexual Activity   Alcohol use: No   Drug use: No   Sexual activity: Yes  Other Topics Concern   Not on file  Social History Narrative   Not on file   Social Determinants of Health   Financial Resource Strain: Not on file  Food Insecurity: Not on file  Transportation Needs: Not on file  Physical Activity: Not on file  Stress: Not on file  Social Connections: Not on file   Past Surgical History:  Procedure Laterality Date   ABDOMINAL HYSTERECTOMY     Past Surgical History:  Procedure Laterality Date   ABDOMINAL HYSTERECTOMY     Past Medical History:  Diagnosis Date   COVID-19 04/2019   Diabetes mellitus without complication (HCC)    Hypertension    Pulmonary emboli (HCC)--presumed post Covid    BP (!) 161/65   Pulse 73   Ht 5\' 1"  (1.549 m)   Wt 171 lb (77.6 kg)   SpO2 99%   BMI 32.31 kg/m   Opioid Risk Score:  Fall Risk Score:  `1  Depression screen PHQ 2/9     06/07/2021    2:16 PM 02/22/2021    1:51 PM 11/19/2020    1:24 PM  Depression screen PHQ 2/9  Decreased Interest 0 0 0  Down, Depressed, Hopeless 0 0 0  PHQ - 2 Score 0 0 0  Altered sleeping   0  Tired, decreased energy   0  Change in appetite   0  Feeling bad or failure about yourself    0  Trouble concentrating   0  Moving slowly or fidgety/restless   0  Suicidal thoughts   0  PHQ-9 Score   0      Review of Systems  Musculoskeletal:        B/L leg pain  All other systems reviewed and are negative.      Objective:   Physical Exam   Awake, alert, appropriate, sitting up on table, no AD, NAD  MS: HF's-  4/5 on L and 4+/5 on R; otherwise 5/5 in KE/DF/PF 5/5 B/L   Neuro: No clonus; no increased tone on exam- no spasms seen today       Assessment & Plan:   Pt is a 67 yr old female with hx of Vit B12 deficiency causing progressive  degeneration o f spinal cord causing quadriparesis- dorsal column syndrome; also has HTN, DM, and here for hospital f/u due to nontraumatic quadriparesis. 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 Injury since 8/22.    Try baclofen 5 mg 3x/day-  (so 1/2 tab 3x/day) but if you notice no difference, can go back to 1/2 tab 5 mg 2x/day- try 2-3 weeks- see how it goes.   2.  BP 160/65-  but rushing to get here today and usually has White coat syndrome- is OK at home.   3. Suggest squats-  not all the way down- - can use Rolling walker to do  them- - does 2 sets of 30- can and I agree with a little more-   4.   Try to walk around yard on days not raining-  thinks anxiety gets in way more than fatigue- but sounds to me more like fatigue- if need be, hold onto to something- buggy, hiking pole, your husband but walk just a little further every week or so.  Add ~ 100 extra steps/week- it's the best way to increase your distance and endurance.    5.  Can get a pedometer to measure how many steps-so can tell doing more than was doing.   6.  Can still try to get back to driving- but go at your own pace.    7.  Will renew Baclofen at lower dose 5 mg 3x/day but as above, can reduce to 2x/day if need be- sent in refills.    8. F/U in 6 months- if doing well, will reduce to 1x/year.   9. Has lost 18 lbs since last visit- that's great- has changed to eating more fruits/veggies and changed diet/more seafood and exercises daily.    I spent a total of 34   minutes on total care today- >50% coordination of care- due to  SCI- spasticity- and driving and intimacy- as well as reviewed meds-and HEP.

## 2022-09-26 NOTE — Patient Instructions (Signed)
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 Injury since 8/22.    Try baclofen 5 mg 3x/day-  (so 1/2 tab 3x/day) but if you notice no difference, can go back to 1/2 tab 5 mg 2x/day- try 2-3 weeks- see how it goes.   2.  BP 160/65-  but rushing to get here today and usually has White coat syndrome- is OK at home.   3. Suggest squats-  not all the way down- - can use Rolling walker to do  them- - does 2 sets of 30- can and I agree with a little more-   4.   Try to walk around yard on days not raining-  thinks anxiety gets in way more than fatigue- but sounds to me more like fatigue- if need be, hold onto to something- buggy, hiking pole, your husband but walk just a little further every week or so.  Add ~ 100 extra steps/week- it's the best way to increase your distance and endurance.    5.  Can get a pedometer to measure how many steps-so can tell doing more than was doing.   6.  Can still try to get back to driving- but go at your own pace.    7.  Will renew Baclofen at lower dose 5 mg 3x/day but as above, can reduce to 2x/day if need be- sent in refills.    8. F/U in 6 months- if doing well, will reduce to 1x/year.

## 2023-03-10 ENCOUNTER — Other Ambulatory Visit: Payer: Self-pay | Admitting: Physical Medicine and Rehabilitation

## 2023-03-27 ENCOUNTER — Encounter: Payer: Medicare HMO | Attending: Physical Medicine and Rehabilitation | Admitting: Physical Medicine and Rehabilitation

## 2023-03-27 ENCOUNTER — Encounter: Payer: Self-pay | Admitting: Physical Medicine and Rehabilitation

## 2023-03-27 VITALS — BP 157/72 | HR 81 | Ht 61.0 in | Wt 163.6 lb

## 2023-03-27 DIAGNOSIS — R252 Cramp and spasm: Secondary | ICD-10-CM | POA: Diagnosis not present

## 2023-03-27 DIAGNOSIS — G8250 Quadriplegia, unspecified: Secondary | ICD-10-CM | POA: Diagnosis not present

## 2023-03-27 DIAGNOSIS — G800 Spastic quadriplegic cerebral palsy: Secondary | ICD-10-CM

## 2023-03-27 MED ORDER — BACLOFEN 10 MG PO TABS: 5.0000 mg | ORAL_TABLET | Freq: Two times a day (BID) | ORAL | 1 refills | Status: AC | PRN

## 2023-03-27 NOTE — Progress Notes (Signed)
 Subjective:    Patient ID: Diane Willis, female    DOB: August 14, 1955, 68 y.o.   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 Injury since 8/22.    Having spasms every once in awhile, and taking Baclofen 5 mg 2x/day still.    When has spasms, it' varies when it occurs- not real bad.  Not painful- don't get in way of moving.  Last only seconds  No falls Walking getting better- can walk  a lot- walking around [employer] and [employer]-   Has been doing some outside walking with weather better-  can be outside all day during day!  Not back driving yet- has to go out with husband and he works all the time.   Husband is only person she trusts.  Has family members- don't live close by.  And all of the work.   Does squats 4x/day- and stretches 4x/day- does not holding onto walker.   Can bend and almost touch toes- lacking 3 inches or so.   Works also on getting on toes and heels.  usually does 2 sets of 35, but tries to put to 3 sets   BP 157/72- but at home 117/62 -   Goes up and down stairs with assistance, but doesn't get a nervous as used to   [employer] for constipation- only takes 1x/month if need be- last time took some was last Tuesday.   When read in front of people, gets nervous- stage fright it sounds like.   Pain Inventory Average Pain  nopain but having very mild spasms Pain Right Now  no pain but very mild spasms My pain is  spasms  In the last 24 hours, has pain interfered with the following? General activity 0 Relation with others 0 Enjoyment of life 0 What TIME of day is your pain at its worst? varies Sleep (in general) Good  Pain is worse with:  no pain Pain improves with:  no  pain Relief from Meds:  no pain meds  Family History  Problem Relation Age of Onset   Healthy Mother    COPD Father    Healthy Sister    HIV Brother    Healthy Sister    HIV Sister    HIV Brother    HIV Brother    HIV Brother    HIV Brother    CAD Neg Hx    Diabetes Mellitus II Neg Hx    Social History   Socioeconomic History   Marital status: Married    Spouse name: Not on file   Number of children: 3   Years of education: Not on file   Highest education level: Not on file  Occupational History   Not on file  Tobacco Use   Smoking status: Never   Smokeless tobacco: Never  Vaping Use   Vaping status: Never Used  Substance and Sexual Activity   Alcohol use: No   Drug use: No   Sexual activity: Yes  Other Topics Concern   Not on file  Social History Narrative   Not on file   Social Drivers of Health   Financial Resource Strain: Not on file  Food Insecurity: Not on file  Transportation Needs: Not  on file  Physical Activity: Not on file  Stress: Not on file  Social Connections: Not on file   Past Surgical History:  Procedure Laterality Date   ABDOMINAL HYSTERECTOMY     Past Surgical History:  Procedure Laterality Date   ABDOMINAL HYSTERECTOMY     Past Medical History:  Diagnosis Date   COVID-19 04/2019   Diabetes mellitus without complication (HCC)    Hypertension    Pulmonary emboli (HCC)--presumed post Covid    There were no vitals taken for this visit.  Opioid Risk Score:   Fall Risk Score:  `1  Depression screen PHQ 2/9     09/26/2022    1:19 PM 06/07/2021    2:16 PM 02/22/2021    1:51 PM 11/19/2020    1:24 PM  Depression screen PHQ 2/9  Decreased Interest 0 0 0 0  Down, Depressed, Hopeless 0 0 0 0  PHQ - 2 Score 0 0 0 0  Altered sleeping    0  Tired, decreased energy    0  Change in appetite    0  Feeling bad or failure about yourself     0  Trouble concentrating    0  Moving slowly or fidgety/restless    0  Suicidal thoughts    0   PHQ-9 Score    0     Review of Systems  Musculoskeletal:        Very mild muscle spamss. Has improved greatly  All other systems reviewed and are negative.     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 Injury since 8/22.     I think that can wean baclofen to 2x/day but AS NEEDED- so only take if tight or has a few spasms.  Only take if painful or gets in way of function, or really annoying. See if you can come off it??? Sent in new prescription.   2.   Takes Vit B12 monthly as shot- and tries to eat foods with Vit B12- so no more signs of deficiency- which is awesome.    3. I expect, because you don't still have Vit B12 deficiency- that things would get back to normal or close to normal  4. Try to get back to driving- just verify that she can get back to driving- start in like church parking lot- and you do well, go to backroads-  and then should be able to drive alone.   5. Has white coat syndrome- does well at home- so no need to intervene   6.  Work on progressing so doesn't need help to go up/down stairs- so only has to do outside home and in niece's home- no step sin her home.   7. F/U in 6 months- single appointment- SCI  I spent a total of  27  minutes on total care today- >50% coordination of care- due to  and  d//w pt about options for driving, spasticity and and Vit B12 deficiency

## 2023-03-27 NOTE — Patient Instructions (Signed)
 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 Injury since 8/22.     I think that can wean baclofen to 2x/day but AS NEEDED- so only take if tight or has a few spasms.  Only take if painful or gets in way of function, or really annoying. See if you can come off it??? Sent in new prescription.   2.   Takes Vit B12 monthly as shot- and tries to eat foods with Vit B12- so no more signs of deficiency- which is awesome.    3. I expect, because you don't still have Vit B12 deficiency- that things would get back to normal or close to normal  4. Try to get back to driving- just verify that she can get back to driving- start in like church parking lot- and you do well, go to backroads-  and then should be able to drive alone.   5. Has white coat syndrome- does well at home- so no need to intervene   6.  Work on progressing so doesn't need help to go up/down stairs- so only has to do outside home and in niece's home- no step sin her home.   7. F/U in 6 months- single appointment- SCI

## 2023-05-26 ENCOUNTER — Other Ambulatory Visit: Payer: Self-pay | Admitting: Internal Medicine

## 2023-05-26 DIAGNOSIS — Z1231 Encounter for screening mammogram for malignant neoplasm of breast: Secondary | ICD-10-CM

## 2023-06-03 ENCOUNTER — Ambulatory Visit
Admission: RE | Admit: 2023-06-03 | Discharge: 2023-06-03 | Disposition: A | Discharge status: Home / Self Care | Source: Ambulatory Visit | Attending: Internal Medicine | Admitting: Internal Medicine

## 2023-06-03 DIAGNOSIS — Z1231 Encounter for screening mammogram for malignant neoplasm of breast: Secondary | ICD-10-CM

## 2023-07-06 ENCOUNTER — Other Ambulatory Visit: Payer: Self-pay

## 2023-07-07 ENCOUNTER — Other Ambulatory Visit: Payer: Self-pay

## 2023-07-09 ENCOUNTER — Telehealth: Payer: Self-pay

## 2023-07-09 NOTE — Telephone Encounter (Addendum)
 Patient has been informed Dr. Cornelio is out of the office this week.  Patient is requesting a brief call from you. She is needing a letter for disability stating her ability to work, but will like to speak with you first.  Call back phone [phone]

## 2023-08-07 ENCOUNTER — Other Ambulatory Visit: Payer: Self-pay

## 2023-10-02 ENCOUNTER — Encounter: Admitting: Physical Medicine and Rehabilitation

## 2023-10-16 ENCOUNTER — Encounter: Payer: Self-pay | Admitting: Physical Medicine and Rehabilitation

## 2023-10-16 ENCOUNTER — Encounter: Attending: Physical Medicine and Rehabilitation | Admitting: Physical Medicine and Rehabilitation

## 2023-10-16 VITALS — BP 149/78 | HR 81 | Ht 61.0 in | Wt 139.0 lb

## 2023-10-16 DIAGNOSIS — G800 Spastic quadriplegic cerebral palsy: Secondary | ICD-10-CM | POA: Insufficient documentation

## 2023-10-16 DIAGNOSIS — G320 Subacute combined degeneration of spinal cord in diseases classified elsewhere: Secondary | ICD-10-CM | POA: Diagnosis not present

## 2023-10-16 DIAGNOSIS — E538 Deficiency of other specified B group vitamins: Secondary | ICD-10-CM | POA: Diagnosis present

## 2023-10-16 NOTE — Patient Instructions (Signed)
 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 Injury since 8/22.   Spasticity has improved dramatically  2. No increased tone- even off baclofen - which is great   3. Great gait- is still improving,   4.  Will have her f/u yearly for baclofen  AS NEEDED  5. Have had a miraculous recovery- a lot of that is you!

## 2023-10-16 NOTE — Progress Notes (Signed)
 Subjective:    Patient ID: Diane Willis, female    DOB: 1955-08-14, 68 y.o.   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 Injury since 8/22.   Doing HEP Everything doing well.    Taking baclofen  as needed  2-3x/month- not much.    Been driving golf cart around yard- doing well with that . Husband hasn't been able to drive with her in car yet- on street some with golf cart.   Husband drove today.    BP 120/80 last night- doing well and as low as 110/52 Today 149/78- but has white coat syndrome.     Niece has steps in home- has steps outside-    Doing really good.  Walking a lot-  got fit bit- doesn't have it on . Does a lot of walking- 45-60 minutes at At Home-  Walking outside as well . Tries to keep busy- and sits down if tired Some times overdoes every once in awhile.  Sits down when that occurs.  Doesn't pay for it the next day.    Pain Inventory Average Pain 0 Pain Right Now 0 My pain is .  In the last 24 hours, has pain interfered with the following? General activity 0 Relation with others 0 Enjoyment of life 0 What TIME of day is your pain at its worst? varies Sleep (in general) Good  Pain is worse with: . Pain improves with: . Relief from Meds: .  Family History  Problem Relation Age of Onset   Healthy Mother    COPD Father    Healthy Sister    HIV Brother    Healthy Sister    HIV Sister    HIV Brother    HIV Brother    HIV Brother    HIV Brother    CAD Neg Hx    Diabetes Mellitus II Neg Hx    Social History   Socioeconomic History   Marital status: Married    Spouse name: Not on file   Number of children: 3   Years of education: Not on file   Highest education  level: Not on file  Occupational History   Not on file  Tobacco Use   Smoking status: Never   Smokeless tobacco: Never  Vaping Use   Vaping status: Never Used  Substance and Sexual Activity   Alcohol use: No   Drug use: No   Sexual activity: Yes  Other Topics Concern   Not on file  Social History Narrative   Not on file   Social Drivers of Health   Financial Resource Strain: Not on file  Food Insecurity: Not on file  Transportation Needs: Not on file  Physical Activity: Not on file  Stress: Not on file  Social Connections: Not on file   Past Surgical History:  Procedure Laterality Date   ABDOMINAL HYSTERECTOMY     Past Surgical History:  Procedure Laterality Date   ABDOMINAL HYSTERECTOMY     Past Medical History:  Diagnosis Date   COVID-19 04/2019   Diabetes mellitus without complication (HCC)    Hypertension    Pulmonary emboli (HCC)--presumed post Covid    BP (!) 149/78   Pulse 81   Ht 5' 1 (  1.549 m)   Wt 139 lb (63 kg)   SpO2 99%   BMI 26.26 kg/m   Opioid Risk Score:   Fall Risk Score:  `1  Depression screen PHQ 2/9     03/27/2023    1:06 PM 09/26/2022    1:19 PM 06/07/2021    2:16 PM 02/22/2021    1:51 PM 11/19/2020    1:24 PM  Depression screen PHQ 2/9  Decreased Interest 0 0 0 0 0  Down, Depressed, Hopeless 0 0 0 0 0  PHQ - 2 Score 0 0 0 0 0  Altered sleeping     0  Tired, decreased energy     0  Change in appetite     0  Feeling bad or failure about yourself      0  Trouble concentrating     0  Moving slowly or fidgety/restless     0  Suicidal thoughts     0  PHQ-9 Score     0      Review of Systems An entire ROS was completed- was (-) except for HPI    Objective:   Physical Exam Awake, alert, appropriate, down to 139 lbs and 5 ft tall; NAD Wearing mask MSK: Ue's- 5/5 in Ue's B/L LE's- 5/5 except  HF 5-/5- little bit weaker in L H and B/L.  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 Injury since 8/22.   Spasticity has improved dramatically  2. No increased tone- even off baclofen - which is great   3. Great gait- is still improving,   4.  Will have her f/u yearly for baclofen  AS NEEDED  5. Have had a miraculous recovery- a lot of that is you!   I spent a total of  21   minutes on total care today- >50% coordination of care- due to  d/w pt about spasticity and recovery in general.

## 2024-10-14 ENCOUNTER — Ambulatory Visit: Admitting: Physical Medicine and Rehabilitation
